# Patient Record
Sex: Male | Born: 1995 | Hispanic: Yes | Marital: Single | Smoking: Light tobacco smoker
Health system: Southern US, Community
[De-identification: ages and names within clinical notes are randomized; demographics above are authoritative.]

---

## 2019-12-01 ENCOUNTER — Other Ambulatory Visit: Payer: Self-pay

## 2019-12-01 ENCOUNTER — Emergency Department
Admission: EM | Admit: 2019-12-01 | Discharge: 2019-12-02 | Disposition: A | Discharge status: Other Acute Inpatient Hospital | Payer: Self-pay | Attending: Emergency Medicine | Admitting: Emergency Medicine

## 2019-12-01 ENCOUNTER — Encounter: Payer: Self-pay | Admitting: Emergency Medicine

## 2019-12-01 DIAGNOSIS — R440 Auditory hallucinations: Secondary | ICD-10-CM | POA: Insufficient documentation

## 2019-12-01 DIAGNOSIS — F29 Unspecified psychosis not due to a substance or known physiological condition: Secondary | ICD-10-CM | POA: Insufficient documentation

## 2019-12-01 DIAGNOSIS — Z20822 Contact with and (suspected) exposure to covid-19: Secondary | ICD-10-CM | POA: Insufficient documentation

## 2019-12-01 DIAGNOSIS — Z046 Encounter for general psychiatric examination, requested by authority: Secondary | ICD-10-CM

## 2019-12-01 DIAGNOSIS — R462 Strange and inexplicable behavior: Secondary | ICD-10-CM

## 2019-12-01 LAB — CBC
HCT: 41.2 % (ref 39.0–52.0)
Hemoglobin: 14.1 g/dL (ref 13.0–17.0)
MCH: 29.7 pg (ref 26.0–34.0)
MCHC: 34.2 g/dL (ref 30.0–36.0)
MCV: 86.7 fL (ref 80.0–100.0)
Platelets: 326 10*3/uL (ref 150–400)
RBC: 4.75 MIL/uL (ref 4.22–5.81)
RDW: 12 % (ref 11.5–15.5)
WBC: 8.7 10*3/uL (ref 4.0–10.5)
nRBC: 0 % (ref 0.0–0.2)

## 2019-12-01 LAB — RESPIRATORY PANEL BY RT PCR (FLU A&B, COVID)
Influenza A by PCR: NEGATIVE
Influenza B by PCR: NEGATIVE
SARS Coronavirus 2 by RT PCR: NEGATIVE

## 2019-12-01 LAB — URINE DRUG SCREEN, QUALITATIVE (ARMC ONLY)
Amphetamines, Ur Screen: NOT DETECTED
Barbiturates, Ur Screen: NOT DETECTED
Benzodiazepine, Ur Scrn: NOT DETECTED
Cannabinoid 50 Ng, Ur ~~LOC~~: NOT DETECTED
Cocaine Metabolite,Ur ~~LOC~~: NOT DETECTED
MDMA (Ecstasy)Ur Screen: NOT DETECTED
Methadone Scn, Ur: NOT DETECTED
Opiate, Ur Screen: NOT DETECTED
Phencyclidine (PCP) Ur S: NOT DETECTED
Tricyclic, Ur Screen: NOT DETECTED

## 2019-12-01 LAB — COMPREHENSIVE METABOLIC PANEL
ALT: 15 U/L (ref 0–44)
AST: 24 U/L (ref 15–41)
Albumin: 4.8 g/dL (ref 3.5–5.0)
Alkaline Phosphatase: 73 U/L (ref 38–126)
Anion gap: 7 (ref 5–15)
BUN: 13 mg/dL (ref 6–20)
CO2: 29 mmol/L (ref 22–32)
Calcium: 9.4 mg/dL (ref 8.9–10.3)
Chloride: 105 mmol/L (ref 98–111)
Creatinine, Ser: 0.99 mg/dL (ref 0.61–1.24)
GFR calc Af Amer: >60 mL/min (ref >60)
GFR calc non Af Amer: >60 mL/min (ref >60)
Glucose, Bld: 90 mg/dL (ref 70–99)
Potassium: 3.5 mmol/L (ref 3.5–5.1)
Sodium: 141 mmol/L (ref 135–145)
Total Bilirubin: 0.9 mg/dL (ref 0.3–1.2)
Total Protein: 8.1 g/dL (ref 6.5–8.1)

## 2019-12-01 LAB — SALICYLATE LEVEL: Salicylate Lvl: <7 mg/dL — ABNORMAL LOW (ref 7.0–30.0)

## 2019-12-01 LAB — ACETAMINOPHEN LEVEL: Acetaminophen (Tylenol), Serum: <10 ug/mL — ABNORMAL LOW (ref 10–30)

## 2019-12-01 LAB — ETHANOL: Alcohol, Ethyl (B): <10 mg/dL (ref <10)

## 2019-12-01 NOTE — ED Notes (Signed)
Pt given meal tray.
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
## 2019-12-01 NOTE — ED Notes (Signed)
Patients sister(Brenda) called to check status on patient.  [name] stated she could be used as a source of information @ [phone]

## 2019-12-01 NOTE — ED Notes (Signed)
Pt given meal tray.

## 2019-12-01 NOTE — ED Triage Notes (Signed)
Pt in via [facility]. Officer reports per pts familym the patient is withdrawing from something. Pt denies drug use or abuse.

## 2019-12-01 NOTE — ED Notes (Addendum)
Pt cooperative with staff.

## 2019-12-01 NOTE — ED Notes (Signed)
Pt belongings:  1- pair shoes 1-pair black socks 1-black sweater 1-boxers 1-pants  Given to RN

## 2019-12-01 NOTE — ED Notes (Signed)
IVC PENDING  CONSULT ?

## 2019-12-01 NOTE — ED Notes (Signed)
TTS machine placed in patients room, pt. Talking to TTS.  Pt. Calm and cooperative.

## 2019-12-01 NOTE — ED Notes (Signed)
This RN in room to speak with pt. RN asked patient if he knew why he was here, pt responded "because of a report". Pt denies mental health hx. Pt denies thoughts of harming himself or other. Pt is not very forthcoming with information at this time. Pt does appear to being having involuntary "jerking" of his body periodically. Pt is A & O in NAD at this time. Pt denies any needs at this time.

## 2019-12-01 NOTE — ED Notes (Signed)
Pt. Awake laying in bed watching tv, waiting for psych. Team to talk to him.  Pt. Is calm and cooperative at this time.

## 2019-12-01 NOTE — ED Notes (Signed)
NP talking to patient in room. 

## 2019-12-02 ENCOUNTER — Inpatient Hospital Stay
Admit: 2019-12-02 | Discharge: 2019-12-19 | DRG: 885 | Disposition: A | Discharge status: Home / Self Care | Payer: No Typology Code available for payment source | Attending: Psychiatry | Admitting: Psychiatry

## 2019-12-02 ENCOUNTER — Encounter: Payer: Self-pay | Admitting: Psychiatric/Mental Health

## 2019-12-02 DIAGNOSIS — Z20822 Contact with and (suspected) exposure to covid-19: Secondary | ICD-10-CM | POA: Diagnosis present

## 2019-12-02 DIAGNOSIS — F259 Schizoaffective disorder, unspecified: Secondary | ICD-10-CM | POA: Insufficient documentation

## 2019-12-02 DIAGNOSIS — F2081 Schizophreniform disorder: Principal | ICD-10-CM | POA: Diagnosis present

## 2019-12-02 DIAGNOSIS — R462 Strange and inexplicable behavior: Secondary | ICD-10-CM

## 2019-12-02 DIAGNOSIS — G3184 Mild cognitive impairment, so stated: Secondary | ICD-10-CM | POA: Diagnosis present

## 2019-12-02 DIAGNOSIS — Z046 Encounter for general psychiatric examination, requested by authority: Secondary | ICD-10-CM

## 2019-12-02 DIAGNOSIS — G4700 Insomnia, unspecified: Secondary | ICD-10-CM | POA: Diagnosis present

## 2019-12-02 HISTORY — DX: Strange and inexplicable behavior: R46.2

## 2019-12-02 MED ORDER — HYDROXYZINE HCL 25 MG PO TABS
25.0000 mg | ORAL_TABLET | Freq: Three times a day (TID) | ORAL | Status: DC | PRN
  Administered 2019-12-11: 25 mg via ORAL
  Filled 2019-12-02 (×2): qty 1

## 2019-12-02 MED ORDER — MAGNESIUM HYDROXIDE 400 MG/5ML PO SUSP: 30.0000 mL | Freq: Every day | ORAL | Status: DC | PRN

## 2019-12-02 MED ORDER — TRAZODONE HCL 50 MG PO TABS
50.0000 mg | ORAL_TABLET | Freq: Every evening | ORAL | Status: DC | PRN
  Administered 2019-12-12 – 2019-12-18 (×3): 50 mg via ORAL
  Filled 2019-12-02 (×3): qty 1

## 2019-12-02 MED ORDER — ALUM & MAG HYDROXIDE-SIMETH 200-200-20 MG/5ML PO SUSP: 30.0000 mL | ORAL | Status: DC | PRN

## 2019-12-02 MED ORDER — ACETAMINOPHEN 325 MG PO TABS: 650.0000 mg | ORAL_TABLET | Freq: Four times a day (QID) | ORAL | Status: DC | PRN

## 2019-12-02 NOTE — ED Notes (Signed)
Pt given meal tray.

## 2019-12-02 NOTE — ED Notes (Signed)
Pt received lunch tray   lw edt 

## 2019-12-02 NOTE — Progress Notes (Signed)
Patient meets criteria for inpatient treatment per Lerry Liner, NP. No appropriate beds at [facility] currently. CSW faxed referrals to the following facilities for review:  [facility]   [facility]   [facility]   [facility]   [facility]   [facility]   [facility]   [facility]   [facility]   [facility]    TTS will continue to seek bed placement.     Ruthann Cancer MSW, [facility] Social Worker Disposition  [facility] Ph: [phone] Fax: [phone] 12/02/2019 10:13 AM
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
## 2019-12-02 NOTE — ED Provider Notes (Signed)
-----------------------------------------   1:19 AM on 12/02/2019 -----------------------------------------  Blood pressure 133/84, pulse 69, temperature 97.9 F (36.6 C), temperature source Oral, resp. rate 18, height 5\' 10"  (1.778 m), weight 76.7 kg, SpO2 96 %.  The patient is calm and cooperative at this time.  There have been no acute events since the last update.  Awaiting disposition plan from Behavioral Medicine team.   , MD 12/02/19 [phone]

## 2019-12-02 NOTE — BH Assessment (Signed)
Assessment Note  Eric Pineda is an 24 y.o. male. Who has been involuntary committed by his family members who state that the patient is withdrawing from something. Patient present as very flat and disengage. His body is stiff and rigid as he lays in the bed although he does engaged actively in the evaluation. He denies any Suicidal Thoughts and stay said he acknowledges that he has behavioral changes since attempting to drink rubbing alcohol approximately a month ago. His behavior changes are unclear as his answers are often inconsistent when I asked what they may be he stays it's been going. Patient off the Flies yes to a question and then we can sand says no or vice versa. When asked why he would leave the home so that night patient states I'm walking for exercise. He denies any previous Mental Health history or current paranoia when asked why he presented to the emergency department he stays on report and repeats report several times patient seems to be suffering from Palilalia. He denies any sleep disturbances or illicit drug use. Pt. denies any suicidal ideation, plan or intent. Pt. denies the presence of any auditory or visual hallucinations at this time. Patient denies any other medical complaints.While pt denied HVA pt appeared extremely distracted and responses were notably slow to questions that the pt did answer   TTS has  contacted the patients sister Eric Pineda to receive collateral information. She explains that the patient's behavior has changed and that her and her father are concerned. Per her report the patient was released from a substance abuse recovery program in January and shortly after attempted to kill himself by drinking three fourths of a bottle of rubbing alcohol. It is unclear as to whether or not this was a true suicide attempt. She reports that he's now begin to have increasing anxiety paste often and often walks out of the home without alarming anyone in the middle of the night.  She also states that he often repeats itself over and over again even when no one else was around. She explains that these behaviors are quite unusual for him.    Diagnosis: Acute Psychosis   Past Medical History: History reviewed. No pertinent past medical history.  History reviewed. No pertinent surgical history.  Family History: No family history on file.  Social History:  has no history on file for tobacco, alcohol, and drug.  Additional Social History:  Alcohol / Drug Use Pain Medications: SEE PTA Prescriptions: SEE PTA Over the Counter: SEE PTA History of alcohol / drug use?: Yes Longest period of sobriety (when/how long): UTA  CIWA: CIWA-Ar BP: 133/84 Pulse Rate: 69 COWS:    Allergies: Not on File  Home Medications: (Not in a hospital admission)   OB/GYN Status:  No LMP for male patient.  General Assessment Data Location of Assessment: [facility] ED TTS Assessment: In system Is this a Tele or Face-to-Face Assessment?: Tele Assessment Is this an Initial Assessment or a Re-assessment for this encounter?: Initial Assessment Living Arrangements: Other (Comment) What gender do you identify as?: Male Living Arrangements: Other relatives Can pt return to current living arrangement?: Yes Admission Status: Involuntary Petitioner: Family member Is patient capable of signing voluntary admission?: No Referral Source: Self/Family/Friend Insurance type: None  Medical Screening Exam [facility] Walk-in ONLY) Medical Exam completed: Yes  Crisis Care Plan Living Arrangements: Other relatives Name of Psychiatrist: None  Name of Therapist: None  Education Status Is patient currently in school?: No Is the patient employed, unemployed or receiving  disability?: Unemployed  Risk to self with the past 6 months Suicidal Ideation: No Has patient been a risk to self within the past 6 months prior to admission? : No Suicidal Intent: No Has patient had any suicidal intent within the past  6 months prior to admission? : No Is patient at risk for suicide?: No, but patient needs Medical Clearance Suicidal Plan?: No Has patient had any suicidal plan within the past 6 months prior to admission? : No Access to Means: No What has been your use of drugs/alcohol within the last 12 months?: Hx of cocaine use Previous Attempts/Gestures: No How many times?: 0 Other Self Harm Risks: n Triggers for Past Attempts: None known Intentional Self Injurious Behavior: None Family Suicide History: No Recent stressful life event(s): (Unknown ) Persecutory voices/beliefs?: No Depression: (UTA) Depression Symptoms: (UTA) Substance abuse history and/or treatment for substance abuse?: Yes Suicide prevention information given to non-admitted patients: Not applicable  Risk to Others within the past 6 months Homicidal Ideation: No Does patient have any lifetime risk of violence toward others beyond the six months prior to admission? : No Thoughts of Harm to Others: No Current Homicidal Intent: No Current Homicidal Plan: No Access to Homicidal Means: No Identified Victim: none Assessment of Violence: None Noted Violent Behavior Description: none noted Does patient have access to weapons?: No Criminal Charges Pending?: No Does patient have a court date: No Is patient on probation?: No  Psychosis Hallucinations: None noted Delusions: None noted  Mental Status Report Appearance/Hygiene: In scrubs Eye Contact: Fair Motor Activity: Rigidity Speech: Elective mutism, Other (Comment)(Palilalia) Level of Consciousness: Alert Mood: Empty Affect: Flat Anxiety Level: Minimal Thought Processes: Irrelevant, Circumstantial Judgement: Partial Orientation: Time, Place, Person Obsessive Compulsive Thoughts/Behaviors: Minimal  Cognitive Functioning Concentration: Poor Memory: Recent Impaired, Remote Impaired Insight: see judgement above Impulse Control: Poor Appetite: Fair Have you had any  weight changes? : (UTA) Sleep: No Change Total Hours of Sleep: 7 Vegetative Symptoms: Unable to Assess  ADLScreening [facility] Assessment Services) Patient's cognitive ability adequate to safely complete daily activities?: Yes Patient able to express need for assistance with ADLs?: Yes Independently performs ADLs?: Yes (appropriate for developmental age)  Prior Inpatient Therapy Prior Inpatient Therapy: No  Prior Outpatient Therapy Prior Outpatient Therapy: No Does patient have an ACCT team?: No Does patient have Intensive In-House Services?  : No Does patient have [facility] services? : No Does patient have P4CC services?: No  ADL Screening (condition at time of admission) Patient's cognitive ability adequate to safely complete daily activities?: Yes Patient able to express need for assistance with ADLs?: Yes Independently performs ADLs?: Yes (appropriate for developmental age)       Abuse/Neglect Assessment (Assessment to be complete while patient is alone) Abuse/Neglect Assessment Can Be Completed: Yes Physical Abuse: Denies Verbal Abuse: Denies Sexual Abuse: Denies Exploitation of patient/patient's resources: Denies Self-Neglect: Denies Values / Beliefs Cultural Requests During Hospitalization: None Spiritual Requests During Hospitalization: None Consults Spiritual Care Consult Needed: No Transition of Care Team Consult Needed: No            Disposition:  Disposition Initial Assessment Completed for this Encounter: Yes  On Site Evaluation by:   Reviewed with Physician:    Laretta Alstrom 12/02/2019 12:20 AM

## 2019-12-02 NOTE — ED Notes (Signed)
Pt. Up using bathroom.  Pt. Returned to room with steady gait. 

## 2019-12-02 NOTE — ED Notes (Signed)
Pt ate 100% of breakfast tray.  Pt calm, resting and waiting on shower supplies.  lw edt

## 2019-12-02 NOTE — BH Assessment (Signed)
PATIENT BED AVAILABLE AT 10PM  Patient is to be admitted to [facility] by Psychiatric Nurse Practitioner Rishaun Dixn.  Attending Physician will be Dr. Toni Amend.   Patient has been assigned to room 312, by [facility] Charge Nurse [place].    ER staff is aware of the admission:  Louann ER Secretary    Dr. Colon Branch, ER MD   Dewayne Hatch Patient's Nurse   Nicole Cella Patient Access.

## 2019-12-03 ENCOUNTER — Other Ambulatory Visit: Payer: Self-pay

## 2019-12-03 ENCOUNTER — Inpatient Hospital Stay: Payer: No Typology Code available for payment source

## 2019-12-03 ENCOUNTER — Encounter: Payer: Self-pay | Admitting: Psychiatric/Mental Health

## 2019-12-03 DIAGNOSIS — F2081 Schizophreniform disorder: Principal | ICD-10-CM

## 2019-12-03 LAB — HEMOGLOBIN A1C
Hgb A1c MFr Bld: 5.1 % (ref 4.8–5.6)
Mean Plasma Glucose: 99.67 mg/dL

## 2019-12-03 LAB — TSH: TSH: 1.412 u[IU]/mL (ref 0.350–4.500)

## 2019-12-03 MED ORDER — HALOPERIDOL 1 MG PO TABS
2.0000 mg | ORAL_TABLET | Freq: Two times a day (BID) | ORAL | Status: DC
  Administered 2019-12-03 – 2019-12-06 (×6): 2 mg via ORAL
  Filled 2019-12-03 (×6): qty 2

## 2019-12-03 MED ORDER — BENZTROPINE MESYLATE 1 MG PO TABS: 0.5000 mg | ORAL_TABLET | Freq: Two times a day (BID) | ORAL | Status: DC | PRN
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
## 2019-12-03 NOTE — BHH Counselor (Signed)
Adult Comprehensive Assessment  Patient ID: Eric Pineda, male   DOB: 1995-09-24, 24 y.o.   MRN: [id number]  Information Source: Information source: Patient  Current Stressors:  Patient states their primary concerns and needs for treatment are:: Pt reports "just report, the usual" Patient states their goals for this hospitilization and ongoing recovery are:: Pt reports "make more time" Educational / Learning stressors: Pt reports "none" Employment / Job issues: Pt reports "none" Family Relationships: Pt reports "nonePublishing copy / Lack of resources (include bankruptcy): Pt reports "none" Housing / Lack of housing: Pt reports "none" Physical health (include injuries & life threatening diseases): Pt reports "diabetes and high blood pressure" Social relationships: Pt reports "none" Substance abuse: Pt reports "none" Bereavement / Loss: Pt reports "my grandfather, passed away a few years ago"  Living/Environment/Situation:  Living Arrangements: Parent, Other relatives Living conditions (as described by patient or guardian): Pt reports "it works" Who else lives in the home?: Pt reports "my parents and my siblings" How long has patient lived in current situation?: Pt reports "for a good minute" What is atmosphere in current home: Comfortable, Quarry manager, Supportive, Dangerous(Pt reports "mother nature")  Family History:  Marital status: Single Are you sexually active?: No What is your sexual orientation?: Pt reports "heterosexual" Does patient have children?: No  Childhood History:  By whom was/is the patient raised?: Both parents, Grandparents Description of patient's relationship with caregiver when they were a child: Pt reports "it was pretty good" Patient's description of current relationship with people who raised him/her: Pt reports "well" How were you disciplined when you got in trouble as a child/adolescent?: Pt reports "whoopings" Does patient have siblings?: Yes Number of  Siblings: 3 Description of patient's current relationship with siblings: Pt reports " I have one older brother and two younger sisters. We speak from time to time" Did patient suffer any verbal/emotional/physical/sexual abuse as a child?: No Did patient suffer from severe childhood neglect?: Yes(Pt reports "both of my parents worked") Has patient ever been sexually abused/assaulted/raped as an adolescent or adult?: No Was the patient ever a victim of a crime or a disaster?: No Witnessed domestic violence?: No Has patient been effected by domestic violence as an adult?: No  Education:  Highest grade of school patient has completed: Pt reports "I dont know" Currently a student?: No Learning disability?: Yes What learning problems does patient have?: Pt reports "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 I smoked it" Alcohol/Substance Abuse Treatment Hx: Denies past history Has alcohol/substance abuse ever caused legal problems?: Yes(Pt reports "I went to jail and got fined")  Social Support System:   Type of faith/religion: Pt reports "[place]" How does patient's faith help to cope with current illness?: Pt reports "well I would say my bible"  Leisure/Recreation:   Leisure and Hobbies: Pt reports "I do sports"  Strengths/Needs:      Discharge Plan:   Does patient have access to transportation?: No Does patient have financial barriers related to discharge medications?:  Yes Will patient be  returning to same living situation after discharge?: Yes  Summary/Recommendations:   Summary and Recommendations (to be completed by the evaluator): Eric Pineda is a 24 year old male from [place] [facility] He receives Medicaid for health coverage. Patient presents to [facility] under IVC by his father due to "Eric Pineda has become more withdrawn from the family and been seen talking to himself and responding to things that nobody else could see." He has a primary diagnosis of Bizarre behavior. Recommendations include crisis stabilization, therapeutic milieu, encourage group attendance and participation, medication management for detox/mood stabilization and development of comprehensive mental wellness/sobriety plan.  Eric Pineda. 12/03/2019

## 2019-12-03 NOTE — Plan of Care (Signed)
24 year old male, brought to the ED involuntarily by BPD. The patient was IVC'd by his father who stated that the patient has become more withdrawn from the family and been seen talking to himself and responding to things that nobody else could see. This is the first documented episode of any psychiatric admission. During the interview with the patient he denied any pain, no allergies to food or drugs, denies any past medical/surgical history. Patient denies tobacco or alcohol use, also any illicit drug use. The patient is able to complete all of his ADL's. No current complaints during admissions. The patient was clearly responding to some type of internal stimuli, before answering any of my questions the patient would look into the corners of the room, then blink his eyes before he would give an answer. During the assessment the patient would answer question by saying "I am on assignment." He could not explain what this answer meant. The patient could not express if he had any family or alternate support system. No known barriers at this time. Will continue to monitor and offer support. 15 minute safety checks.

## 2019-12-03 NOTE — Plan of Care (Signed)
D: Pt is alert and oriented to person, time, but not to place or situation. Pt denies SI/HI/AVH. Pt is cooperative with care, is noted to be pacing the hallways, restless, making bizzare ritualistic steps when walking in the hall. Pt is selectively mute, affect if flat, makes poor eye contact. Pt was cooperative with CT procedure, and EKG placement. Pt is present at meals, is seclusive to self, does not initiate contact with staff or peers. Pt is guarded, and suspicious. Later in the shift, pt opens up a little more and will answer yes or no questions without elaborating, reports yes when asked if he lives with his mother and father and siblings. Pt reports the year, "2021." When asked if pt sees an outpt doctor, pt says "yes." When asked if he had ever been hospitalized pt states "no." No distress noted or reported. Pt appears to be responding to internal stimuli, some self talk noted, mumbling to self, when asked what he is saying or to whom he is speaking, pt refuses to answer.  A: Will continue to monitor pt per Q15 minute face checks and monitor for safety and progress.  R: Safety maintained.   Problem: Education: Goal: Knowledge of [facility] Education information/materials will improve Outcome: Not Progressing Goal: Emotional status will improve Outcome: Not Progressing Goal: Mental status will improve Outcome: Not Progressing Goal: Verbalization of understanding the information provided will improve Outcome: Not Progressing   Problem: Activity: Goal: Interest or engagement in activities will improve Outcome: Not Progressing Goal: Sleeping patterns will improve Outcome: Not Progressing   Problem: Coping: Goal: Ability to verbalize frustrations and anger appropriately will improve Outcome: Not Progressing Goal: Ability to demonstrate self-control will improve Outcome: Not Progressing   Problem: Health Behavior/Discharge Planning: Goal: Identification of resources available  to assist in meeting health care needs will improve Outcome: Not Progressing Goal: Compliance with treatment plan for underlying cause of condition will improve Outcome: Not Progressing   Problem: Physical Regulation: Goal: Ability to maintain clinical measurements within normal limits will improve Outcome: Not Progressing   Problem: Safety: Goal: Periods of time without injury will increase Outcome: Not Progressing
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
## 2019-12-04 NOTE — Progress Notes (Signed)
D: Patient remains isolative to self. Was observed posturing and pacing the hall in a ritualistic manner. Internally preoccupied. Not voicing any SI, HI or AVH. Affect blunted.  A: Continue to monitor for safety R: Safety maintained.

## 2019-12-04 NOTE — Progress Notes (Signed)
[facility] MD Progress Note  12/04/2019 3:15 PM Eric Pineda  MRN:  [id number] Subjective: Follow-up for this patient with what seems like probably a new onset psychosis.  Patient was seen once again today in the office.  He was cooperative in a passive manner but his answers to questions were once again extraordinarily vague.  Could not give an answer that made sense to anything about what he had been doing recently or what his plans were for the future.  Affect flat and strange looking.  Thoughts seem very blocked.  Has not been aggressive or any danger so far in the hospital and appears to be compliant with medicine Principal Problem: Schizophreniform disorder ([place]) Diagnosis: Principal Problem:   Schizophreniform disorder ([place])  Total Time spent with patient: 30 minutes  Past Psychiatric History: Past history of substance abuse but not known as far as I can tell to have a past history of other serious mental illness  Past Medical History:  Past Medical History:  Diagnosis Date  . Bizarre behavior 12/02/2019   History reviewed. No pertinent surgical history. Family History: History reviewed. No pertinent family history. Family Psychiatric  History: See previous Social History:  Social History   Substance and Sexual Activity  Alcohol Use None     Social History   Substance and Sexual Activity  Drug Use Not on file    Social History   Socioeconomic History  . Marital status: Single    Spouse name: Not on file  . Number of children: Not on file  . Years of education: Not on file  . Highest education level: Not on file  Occupational History  . Not on file  Tobacco Use  . Smoking status: Never Smoker  . Smokeless tobacco: Never Used  Substance and Sexual Activity  . Alcohol use: Not on file  . Drug use: Not on file  . Sexual activity: Not on file  Other Topics Concern  . Not on file  Social History Narrative  . Not on file   Social Determinants of Health   Financial  Resource Strain:   . Difficulty of Paying Living Expenses:   Food Insecurity:   . Worried About Charity fundraiser in the Last Year:   . Arboriculturist in the Last Year:   Transportation Needs:   . Film/video editor (Medical):   Marland Kitchen Lack of Transportation (Non-Medical):   Physical Activity:   . Days of Exercise per Week:   . Minutes of Exercise per Session:   Stress:   . Feeling of Stress :   Social Connections:   . Frequency of Communication with Friends and Family:   . Frequency of Social Gatherings with Friends and Family:   . Attends Religious Services:   . Active Member of Clubs or Organizations:   . Attends Archivist Meetings:   Marland Kitchen Marital Status:    Additional Social History:                         Sleep: Fair  Appetite:  Fair  Current Medications: Current Facility-Administered Medications  Medication Dose Route Frequency Provider Last Rate Last Admin  . acetaminophen (TYLENOL) tablet 650 mg  650 mg Oral Q6H PRN Dixon, Rashaun M, NP      . alum & mag hydroxide-simeth (MAALOX/MYLANTA) 200-200-20 MG/5ML suspension 30 mL  30 mL Oral Q4H PRN Dixon, Rashaun M, NP      . benztropine (COGENTIN) tablet 0.5  mg  0.5 mg Oral BID PRN Lucynda Rosano, Jackquline Denmark, MD      . haloperidol (HALDOL) tablet 2 mg  2 mg Oral BID Jailani Hogans, Jackquline Denmark, MD   2 mg at 12/04/19 5361  . hydrOXYzine (ATARAX/VISTARIL) tablet 25 mg  25 mg Oral TID PRN Jearld Lesch, NP      . magnesium hydroxide (MILK OF MAGNESIA) suspension 30 mL  30 mL Oral Daily PRN Durwin Nora, Rashaun M, NP      . traZODone (DESYREL) tablet 50 mg  50 mg Oral QHS PRN Jearld Lesch, NP        Lab Results:  Results for orders placed or performed during the hospital encounter of 12/02/19 (from the past 48 hour(s))  Hemoglobin A1c     Status: None   Collection Time: 12/03/19  1:36 PM  Result Value Ref Range   Hgb A1c MFr Bld 5.1 4.8 - 5.6 %    Comment: (NOTE) Pre diabetes:          5.7%-6.4% Diabetes:               >6.4% Glycemic control for   <7.0% adults with diabetes    Mean Plasma Glucose 99.67 mg/dL    Comment: Performed at [facility], [address], [place], [place] [address]  TSH     Status: None   Collection Time: 12/03/19  1:36 PM  Result Value Ref Range   TSH 1.412 0.350 - 4.500 uIU/mL    Comment: Performed by a 3rd Generation assay with a functional sensitivity of <=0.01 uIU/mL. Performed at [facility], [address] [place], [place] [address]     Blood Alcohol level:  Lab Results  Component Value Date   [facility] <10 12/01/2019    Metabolic Disorder Labs: Lab Results  Component Value Date   HGBA1C 5.1 12/03/2019   MPG 99.67 12/03/2019   No results found for: PROLACTIN No results found for: CHOL, TRIG, HDL, CHOLHDL, VLDL, LDLCALC  Physical Findings: AIMS:  , ,  ,  ,    CIWA:    COWS:     Musculoskeletal: Strength & Muscle Tone: within normal limits Gait & Station: normal Patient leans: N/A  Psychiatric Specialty Exam: Physical Exam  Nursing note and vitals reviewed. Constitutional: He appears well-developed and well-nourished.  HENT:  Head: Normocephalic and atraumatic.  Eyes: Pupils are equal, round, and reactive to light. Conjunctivae are normal.  Cardiovascular: Regular rhythm and normal heart sounds.  Respiratory: Effort normal. No respiratory distress.  GI: Soft.  Musculoskeletal:        General: Normal range of motion.     Cervical back: Normal range of motion.  Neurological: He is alert.  Skin: Skin is warm and dry.  Psychiatric: His affect is blunt. He is withdrawn. He is not agitated. Cognition and memory are impaired. He expresses inappropriate judgment. He is noncommunicative.    Review of Systems  Constitutional: Negative.   HENT: Negative.   Eyes: Negative.   Respiratory: Negative.   Cardiovascular: Negative.   Gastrointestinal: Negative.   Musculoskeletal: Negative.   Skin: Negative.   Neurological: Negative.    Psychiatric/Behavioral: Negative.     Blood pressure 123/88, pulse 71, temperature 97.8 F (36.6 C), temperature source Oral, resp. rate 18, height 5\' 8"  (1.727 m), weight 71.7 kg, SpO2 99 %.Body mass index is 24.02 kg/m.  General Appearance: Casual  Eye Contact:  Minimal  Speech:  Slow  Volume:  Decreased  Mood:  Euthymic  Affect:  Constricted  Thought Process:  Disorganized  Orientation:  Negative  Thought Content:  Illogical  Suicidal Thoughts:  No  Homicidal Thoughts:  No  Memory:  Immediate;   Fair Recent;   Poor Remote;   Poor  Judgement:  Impaired  Insight:  Lacking  Psychomotor Activity:  Decreased  Concentration:  Concentration: Poor  Recall:  Poor  Fund of Knowledge:  Fair  Language:  Fair  Akathisia:  No  Handed:  Right  AIMS (if indicated):     Assets:  Physical Health Resilience  ADL's:  Impaired  Cognition:  Impaired,  Mild  Sleep:  Number of Hours: 5     Treatment Plan Summary: Daily contact with patient to assess and evaluate symptoms and progress in treatment, Medication management and Plan CAT scan was completely normal.  EKG completely normal.  Tolerating Haldol.  Tried to explain to him once again why the medication was ordered and what the overall diagnosis and treatment plan were.  He made no response.  Still passively cooperative.  I will try once again today to get in touch with his family.  Mordecai Rasmussen, MD 12/04/2019, 3:15 PM

## 2019-12-04 NOTE — Plan of Care (Signed)
  Problem: Education: Goal: Knowledge of [facility] Education information/materials will improve Outcome: Not Progressing Goal: Emotional status will improve Outcome: Not Progressing Goal: Mental status will improve Outcome: Not Progressing Goal: Verbalization of understanding the information provided will improve Outcome: Not Progressing  D: Patient remains isolative to self. Was observed posturing and pacing the hall in a ritualistic manner. Internally preoccupied. Not voicing any SI, HI or AVH. Affect blunted.  A: Continue to monitor for safety R: Safety maintained.

## 2019-12-04 NOTE — BHH Group Notes (Signed)
B[facility] Group Notes:  (Nursing/MHT/Case Management/Adjunct)  Date:  12/04/2019  Time:  9:19 PM  Type of Therapy:  Group Therapy  Participation Level:  Did Not Attend    Landry Mellow 12/04/2019, 9:19 PM

## 2019-12-04 NOTE — Progress Notes (Signed)
Recreation Therapy Notes  Date: 12/04/2019  Time: 9:30 am   Location: Craft room   Behavioral response: N/A   Intervention Topic: Stress Management   Discussion/Intervention: Patient did not attend group.   Clinical Observations/Feedback:  Patient did not attend group.   Eric Pineda LRT/CTRS        Greta Yung 12/04/2019 11:08 AM 

## 2019-12-04 NOTE — BHH Group Notes (Signed)
Overcoming Obstacles  12/04/2019 1PM  Type of Therapy and Topic:  Group Therapy:  Overcoming Obstacles  Participation Level:  Did Not Attend    Description of Group:    In this group patients will be encouraged to explore what they see as obstacles to their own wellness and recovery. They will be guided to discuss their thoughts, feelings, and behaviors related to these obstacles. The group will process together ways to cope with barriers, with attention given to specific choices patients can make. Each patient will be challenged to identify changes they are motivated to make in order to overcome their obstacles. This group will be process-oriented, with patients participating in exploration of their own experiences as well as giving and receiving support and challenge from other group members.   Therapeutic Goals: 1. Patient will identify personal and current obstacles as they relate to admission. 2. Patient will identify barriers that currently interfere with their wellness or overcoming obstacles.  3. Patient will identify feelings, thought process and behaviors related to these barriers. 4. Patient will identify two changes they are willing to make to overcome these obstacles:      Summary of Patient Progress     Therapeutic Modalities:   Cognitive Behavioral Therapy Solution Focused Therapy Motivational Interviewing Relapse Prevention Therapy    Lowella Dandy, MSW, LCSW 12/04/2019 2:31 PM

## 2019-12-04 NOTE — Progress Notes (Signed)
D: Patient Presents with appropriate mood and affect.  Patient was calm and cooperative during med pass and took his medicine without incident.  Patient was isolative.   Patient denies SI/HI/AVH.  Patient denies physical pain per inventory. Patient reports "good" concentration and sleep.  Patient reports "normal" energy levels.   A:  A: Continue to monitor for safety.  R: Safety maintained.   Problem: Education: Goal: Knowledge of [place] [facility] Education information/materials will improve Outcome: Not Progressing Goal: Emotional status will improve Outcome: Not Progressing Goal: Mental status will improve Outcome: Not Progressing Goal: Verbalization of understanding the information provided will improve Outcome: Not Progressing   Problem: Activity: Goal: Interest or engagement in activities will improve Outcome: Not Progressing Goal: Sleeping patterns will improve Outcome: Not Progressing   Problem: Coping: Goal: Ability to verbalize frustrations and anger appropriately will improve Outcome: Not Progressing Goal: Ability to demonstrate self-control will improve Outcome: Not Progressing   Problem: Health Behavior/Discharge Planning: Goal: Identification of resources available to assist in meeting health care needs will improve Outcome: Not Progressing Goal: Compliance with treatment plan for underlying cause of condition will improve Outcome: Not Progressing   Problem: Physical Regulation: Goal: Ability to maintain clinical measurements within normal limits will improve Outcome: Not Progressing   Problem: Safety: Goal: Periods of time without injury will increase Outcome: Not Progressing

## 2019-12-04 NOTE — B[facility] Group Notes (Signed)
[facility] Group Notes:  (Nursing/MHT/Case Management/Adjunct)  Date:  12/04/2019  Time:  3:19 PM  Type of Therapy:  Music Group  Participation Level:  Did Not Attend  Summary of Progress/Problems:  Kerrie Pleasure 12/04/2019, 3:19 PM

## 2019-12-05 NOTE — Progress Notes (Signed)
Pt has been isolative in room all day except for meals. Pt took a shower and asked for new scrubs. Pt is flat but cooperative. Pt has no complaints.  Torrie Mayers RN

## 2019-12-05 NOTE — Tx Team (Addendum)
Interdisciplinary Treatment and Diagnostic Plan Update  12/05/2019 Time of Session: 9am Eric Pineda MRN: [id number]  Principal Diagnosis: Schizophreniform disorder [facility])  Secondary Diagnoses: Principal Problem:   Schizophreniform disorder ([place])   Current Medications:  Current Facility-Administered Medications  Medication Dose Route Frequency Provider Last Rate Last Admin  . acetaminophen (TYLENOL) tablet 650 mg  650 mg Oral Q6H PRN Dixon, Rashaun M, NP      . alum & mag hydroxide-simeth (MAALOX/MYLANTA) 200-200-20 MG/5ML suspension 30 mL  30 mL Oral Q4H PRN Dixon, Rashaun M, NP      . benztropine (COGENTIN) tablet 0.5 mg  0.5 mg Oral BID PRN Clapacs, John T, MD      . haloperidol (HALDOL) tablet 2 mg  2 mg Oral BID Clapacs, Madie Reno, MD   2 mg at 12/05/19 0909  . hydrOXYzine (ATARAX/VISTARIL) tablet 25 mg  25 mg Oral TID PRN Dixon, Ernst Bowler, NP      . magnesium hydroxide (MILK OF MAGNESIA) suspension 30 mL  30 mL Oral Daily PRN Dixon, Rashaun M, NP      . traZODone (DESYREL) tablet 50 mg  50 mg Oral QHS PRN Deloria Lair, NP       PTA Medications: No medications prior to admission.    Patient Stressors:    Patient Strengths:    Treatment Modalities: Medication Management, Group therapy, Case management,  1 to 1 session with clinician, Psychoeducation, Recreational therapy.   Physician Treatment Plan for Primary Diagnosis: Schizophreniform disorder [facility]) Long Term Goal(s): Improvement in symptoms so as ready for discharge Improvement in symptoms so as ready for discharge   Short Term Goals: Ability to demonstrate self-control will improve Ability to identify and develop effective coping behaviors will improve Ability to maintain clinical measurements within normal limits will improve Compliance with prescribed medications will improve  Medication Management: Evaluate patient's response, side effects, and tolerance of medication regimen.  Therapeutic Interventions: 1 to  1 sessions, Unit Group sessions and Medication administration.  Evaluation of Outcomes: Not Met  Physician Treatment Plan for Secondary Diagnosis: Principal Problem:   Schizophreniform disorder ([place])  Long Term Goal(s): Improvement in symptoms so as ready for discharge Improvement in symptoms so as ready for discharge   Short Term Goals: Ability to demonstrate self-control will improve Ability to identify and develop effective coping behaviors will improve Ability to maintain clinical measurements within normal limits will improve Compliance with prescribed medications will improve     Medication Management: Evaluate patient's response, side effects, and tolerance of medication regimen.  Therapeutic Interventions: 1 to 1 sessions, Unit Group sessions and Medication administration.  Evaluation of Outcomes: Not Met   RN Treatment Plan for Primary Diagnosis: Schizophreniform disorder ([place]) Long Term Goal(s): Knowledge of disease and therapeutic regimen to maintain health will improve  Short Term Goals: Ability to verbalize feelings will improve, Ability to identify and develop effective coping behaviors will improve and Compliance with prescribed medications will improve  Medication Management: RN will administer medications as ordered by provider, will assess and evaluate patient's response and provide education to patient for prescribed medication. RN will report any adverse and/or side effects to prescribing provider.  Therapeutic Interventions: 1 on 1 counseling sessions, Psychoeducation, Medication administration, Evaluate responses to treatment, Monitor vital signs and CBGs as ordered, Perform/monitor CIWA, COWS, AIMS and Fall Risk screenings as ordered, Perform wound care treatments as ordered.  Evaluation of Outcomes: Not Met   LCSW Treatment Plan for Primary Diagnosis: Schizophreniform disorder ([place]) Long Term Goal(s): Safe  transition to appropriate next level of care at  discharge, Engage patient in therapeutic group addressing interpersonal concerns.  Short Term Goals: Engage patient in aftercare planning with referrals and resources  Therapeutic Interventions: Assess for all discharge needs, 1 to 1 time with Social worker, Explore available resources and support systems, Assess for adequacy in community support network, Educate family and significant other(s) on suicide prevention, Complete Psychosocial Assessment, Interpersonal group therapy.  Evaluation of Outcomes: Not Met   Progress in Treatment: Attending groups: No. Participating in groups: No. Taking medication as prescribed: Yes. Toleration medication: Yes. Family/Significant other contact made: Yes, individual(s) contacted:  pt declined Patient understands diagnosis: Unknown Discussing patient identified problems/goals with staff: No. Medical problems stabilized or resolved: No. Denies suicidal/homicidal ideation: Unknown Issues/concerns per patient self-inventory: No. Other: NA  New problem(s) identified: No, Describe:  None reported  New Short Term/Long Term Goal(s):  Patient Goals:  Pt given opportunity to attend meeting but declined. No goal provided at this time.  Discharge Plan or Barriers:   Reason for Continuation of Hospitalization: Medication stabilization  Estimated Length of Stay:1-7 days  Recreational Therapy: Patient Stressors: N/A Patient Goal: Patient will engage in groups without prompting or encouragement from LRT x3 group sessions within 5 recreation therapy group sessions  Attendees: Patient: 12/05/2019 9:50 AM  Physician: Alethia Berthold 12/05/2019 9:50 AM  Nursing:  12/05/2019 9:50 AM  RN Care Manager: 12/05/2019 9:50 AM  Social Worker: Minette Brine Moton 12/05/2019 9:50 AM  Recreational Therapist: Isaias Sakai Damany Eastman 12/05/2019 9:50 AM  Other:  12/05/2019 9:50 AM  Other:  12/05/2019 9:50 AM  Other: 12/05/2019 9:50 AM    Scribe for Treatment Team: Yvette Rack,  LCSW 12/05/2019 9:50 AM

## 2019-12-05 NOTE — BHH Counselor (Signed)
CSW attempted to meet with the pt to discuss a discharge plan. When asked if he has a mental health provider he sees, pt states "yes" and did not provide any further details. When asked if he would like CSW to make a referral to a provider, pt states "thank you maam."

## 2019-12-05 NOTE — Progress Notes (Signed)
[facility] MD Progress Note  12/05/2019 4:26 PM Eric Pineda  MRN:  [id number] Subjective: Follow-up for this 24 year old man who appears to have new onset psychosis.  Patient came to the office to speak with me today but only briefly.  After just a couple of questions he abruptly got up and told me that he could not talk anymore.  This was particularly odd as it did not seem to me that we had been discussing anything particularly emotional or difficult.  He continues to answer questions with only a few stereotyped phrases and could not give me any clear picture of what is been happening before he came in.  I spoke with his sister this evening and she clarified the history that he had been in [place] for a while and substance abuse treatment prior to coming home in January and that ever since then he seems to have been having these kind of psychotic behaviors.  Evidently there was an episode in which she drank rubbing alcohol and was treated at the hospital in [place] but did not get any psychiatric treatment or follow-up. Principal Problem: Schizophreniform disorder ([place]) Diagnosis: Principal Problem:   Schizophreniform disorder ([place])  Total Time spent with patient: 30 minutes  Past Psychiatric History: Other than substance abuse especially alcohol problems nothing known  Past Medical History:  Past Medical History:  Diagnosis Date  . Bizarre behavior 12/02/2019   History reviewed. No pertinent surgical history. Family History: History reviewed. No pertinent family history. Family Psychiatric  History: See previous Social History:  Social History   Substance and Sexual Activity  Alcohol Use None     Social History   Substance and Sexual Activity  Drug Use Not on file    Social History   Socioeconomic History  . Marital status: Single    Spouse name: Not on file  . Number of children: Not on file  . Years of education: Not on file  . Highest education level: Not on file  Occupational  History  . Not on file  Tobacco Use  . Smoking status: Never Smoker  . Smokeless tobacco: Never Used  Substance and Sexual Activity  . Alcohol use: Not on file  . Drug use: Not on file  . Sexual activity: Not on file  Other Topics Concern  . Not on file  Social History Narrative  . Not on file   Social Determinants of Health   Financial Resource Strain:   . Difficulty of Paying Living Expenses:   Food Insecurity:   . Worried About Charity fundraiser in the Last Year:   . Arboriculturist in the Last Year:   Transportation Needs:   . Film/video editor (Medical):   Marland Kitchen Lack of Transportation (Non-Medical):   Physical Activity:   . Days of Exercise per Week:   . Minutes of Exercise per Session:   Stress:   . Feeling of Stress :   Social Connections:   . Frequency of Communication with Friends and Family:   . Frequency of Social Gatherings with Friends and Family:   . Attends Religious Services:   . Active Member of Clubs or Organizations:   . Attends Archivist Meetings:   Marland Kitchen Marital Status:    Additional Social History:                         Sleep: Fair  Appetite:  Fair  Current Medications: Current Facility-Administered Medications  Medication  Dose Route Frequency Provider Last Rate Last Admin  . acetaminophen (TYLENOL) tablet 650 mg  650 mg Oral Q6H PRN Dixon, Rashaun M, NP      . alum & mag hydroxide-simeth (MAALOX/MYLANTA) 200-200-20 MG/5ML suspension 30 mL  30 mL Oral Q4H PRN Dixon, Rashaun M, NP      . benztropine (COGENTIN) tablet 0.5 mg  0.5 mg Oral BID PRN Paulyne Mooty T, MD      . haloperidol (HALDOL) tablet 2 mg  2 mg Oral BID Cecylia Brazill, Jackquline Denmark, MD   2 mg at 12/05/19 0909  . hydrOXYzine (ATARAX/VISTARIL) tablet 25 mg  25 mg Oral TID PRN Dixon, Elray Buba, NP      . magnesium hydroxide (MILK OF MAGNESIA) suspension 30 mL  30 mL Oral Daily PRN Dixon, Elray Buba, NP      . traZODone (DESYREL) tablet 50 mg  50 mg Oral QHS PRN Jearld Lesch, NP        Lab Results: No results found for this or any previous visit (from the past 48 hour(s)).  Blood Alcohol level:  Lab Results  Component Value Date   ETH <10 12/01/2019    Metabolic Disorder Labs: Lab Results  Component Value Date   HGBA1C 5.1 12/03/2019   MPG 99.67 12/03/2019   No results found for: PROLACTIN No results found for: CHOL, TRIG, HDL, CHOLHDL, VLDL, LDLCALC  Physical Findings: AIMS:  , ,  ,  ,    CIWA:    COWS:     Musculoskeletal: Strength & Muscle Tone: within normal limits Gait & Station: normal Patient leans: N/A  Psychiatric Specialty Exam: Physical Exam  Nursing note and vitals reviewed. Constitutional: He appears well-developed and well-nourished.  HENT:  Head: Normocephalic and atraumatic.  Eyes: Pupils are equal, round, and reactive to light. Conjunctivae are normal.  Cardiovascular: Regular rhythm and normal heart sounds.  Respiratory: Effort normal. No respiratory distress.  GI: Soft.  Musculoskeletal:        General: Normal range of motion.     Cervical back: Normal range of motion.  Neurological: He is alert.  Skin: Skin is warm and dry.  Psychiatric: His affect is blunt and inappropriate. He is withdrawn. He is not agitated and not aggressive. Cognition and memory are impaired. He expresses inappropriate judgment. He is noncommunicative.    Review of Systems  Unable to perform ROS: Psychiatric disorder  HENT: Negative.     Blood pressure 132/89, pulse 74, temperature 97.6 F (36.4 C), temperature source Oral, resp. rate 17, height 5\' 8"  (1.727 m), weight 71.7 kg, SpO2 98 %.Body mass index is 24.02 kg/m.  General Appearance: Casual  Eye Contact:  Minimal  Speech:  Garbled and Slow  Volume:  Decreased  Mood:  Euthymic  Affect:  Inappropriate  Thought Process:  Disorganized  Orientation:  Negative  Thought Content:  Illogical, Rumination and Tangential  Suicidal Thoughts:  No  Homicidal Thoughts:  No  Memory:   Immediate;   Poor Recent;   Poor Remote;   Poor  Judgement:  Impaired  Insight:  Shallow  Psychomotor Activity:  Decreased  Concentration:  Concentration: Poor  Recall:  Poor  Fund of Knowledge:  Poor  Language:  Poor  Akathisia:  No  Handed:  Right  AIMS (if indicated):     Assets:  Housing Physical Health Resilience  ADL's:  Impaired  Cognition:  Impaired,  Mild  Sleep:  Number of Hours: 8.15     Treatment Plan Summary:  Daily contact with patient to assess and evaluate symptoms and progress in treatment, Medication management and Plan After talking with his sister it does occur to me that Korsakoff's dementia would be in the differential diagnosis although he really does not to me appear very much like the patient's I have known with that diagnosis.  He has bizarre stereotyped behavior and the fact that rather than confabulating he is not talking at all do not you really fit with Korsakoff's.  I still think this is most likely schizophreniform disorder.  I explained this to his sister.  We will try to contact the rest of the family tomorrow as well.  For now continue trying to get him to take antipsychotic medicine.  Mordecai Rasmussen, MD 12/05/2019, 4:26 PM

## 2019-12-05 NOTE — Plan of Care (Signed)
Pt rates depression 10/10, Pt denies anxiety, SI, HI and AVH. Pt was educated on care plan and verbalizes understanding. Torrie Mayers RN Problem: Education: Goal: Knowledge of [place] [facility] Education information/materials will improve Outcome: Progressing Goal: Emotional status will improve Outcome: Progressing Goal: Mental status will improve Outcome: Progressing Goal: Verbalization of understanding the information provided will improve Outcome: Progressing   Problem: Activity: Goal: Interest or engagement in activities will improve Outcome: Not Progressing Goal: Sleeping patterns will improve Outcome: Progressing   Problem: Coping: Goal: Ability to verbalize frustrations and anger appropriately will improve Outcome: Progressing Goal: Ability to demonstrate self-control will improve Outcome: Progressing   Problem: Health Behavior/Discharge Planning: Goal: Identification of resources available to assist in meeting health care needs will improve Outcome: Progressing Goal: Compliance with treatment plan for underlying cause of condition will improve Outcome: Progressing   Problem: Physical Regulation: Goal: Ability to maintain clinical measurements within normal limits will improve Outcome: Progressing   Problem: Safety: Goal: Periods of time without injury will increase Outcome: Progressing

## 2019-12-05 NOTE — Progress Notes (Signed)
Patient was in his room upon arrival to the unit. Patient pleasant during assessment denying SI/HI/AVH and anxiety. Pt endorses depression stating he always feels depressed. Patient given education, support and encouragement to be active in his treatment plan. Patient isolative to his room and minimal with staff this evening. Patient being monitored Q 15 minutes for safety per unit protocol, pt remains safe on the unit.

## 2019-12-05 NOTE — Plan of Care (Signed)
Pt stated he was feeling better today compared to yesterday.   Problem: Education: Goal: Emotional status will improve Outcome: Progressing Goal: Mental status will improve Outcome: Progressing

## 2019-12-05 NOTE — Progress Notes (Signed)
Patient alert and oriented x 3 with periods of confusion to situation, his  affect is blunted thoughts are disorganized, speech tangential , he denies SI/HI/AVH but noted responding to internal stimuli. Patient was also noted pacing the unit in an instance he was impulsive eating off someone's left over tray, he was redirected to put off the tray he became agitated but later was receptive.Patient was given a sandwich tray was offered emotional support and encouraged to attend evenong wrap up group. Patient attend group, he refused PRN sleep aide, 15 minutes safety checks maintained.

## 2019-12-05 NOTE — BHH Group Notes (Signed)
  LCSW Group Therapy Note  12/05/2019 12:44 PM   Type of Therapy/Topic:  Group Therapy:  Feelings about Diagnosis  Participation Level:  Did Not Attend   Description of Group:   This group will allow patients to explore their thoughts and feelings about diagnoses they have received. Patients will be guided to explore their level of understanding and acceptance of these diagnoses. Facilitator will encourage patients to process their thoughts and feelings about the reactions of others to their diagnosis and will guide patients in identifying ways to discuss their diagnosis with significant others in their lives. This group will be process-oriented, with patients participating in exploration of their own experiences, giving and receiving support, and processing challenge from other group members.   Therapeutic Goals: 1. Patient will demonstrate understanding of diagnosis as evidenced by identifying two or more symptoms of the disorder 2. Patient will be able to express two feelings regarding the diagnosis 3. Patient will demonstrate their ability to communicate their needs through discussion and/or role play  Summary of Patient Progress: x   Therapeutic Modalities:   Cognitive Behavioral Therapy Brief Therapy Feelings Identification    Iris Pert, MSW, LCSW Clinical Social Work 12/05/2019 12:44 PM

## 2019-12-05 NOTE — Progress Notes (Signed)
Recreation Therapy Notes     Date: 12/05/2019  Time: 9:30 am   Location: Craft room   Behavioral response: N/A   Intervention Topic: Necessities    Discussion/Intervention: Patient did not attend group.   Clinical Observations/Feedback:  Patient did not attend group.   Murial Beam LRT/CTRS        Kyandra Mcclaine 12/05/2019 11:52 AM

## 2019-12-05 NOTE — Progress Notes (Signed)
Recreation Therapy Notes  INPATIENT RECREATION THERAPY ASSESSMENT  Patient Details Name: Eric Pineda MRN: [id number] DOB: 07/01/1996 Today's Date: 12/05/2019       Information Obtained From: Patient(Patient expressed he was to tired)  Able to Participate in Assessment/Interview:    Patient Presentation:    Reason for Admission (Per Patient):    Patient Stressors:    Coping Skills:      Leisure Interests (2+):     Frequency of Recreation/Participation:    Awareness of Community Resources:     [employer]:     Current Use:    If no, Barriers?:    Expressed Interest in [employer] Information:    [place] of Residence:     Patient Main Form of Transportation:    Patient Strengths:     Patient Identified Areas of Improvement:     Patient Goal for Hospitalization:     Current SI (including self-harm):     Current HI:     Current AVH:    Staff Intervention Plan:    Consent to Intern Participation:    Katlyn Muldrew 12/05/2019, 1:23 PM

## 2019-12-06 MED ORDER — HALOPERIDOL 5 MG PO TABS
5.0000 mg | ORAL_TABLET | Freq: Two times a day (BID) | ORAL | Status: DC
  Administered 2019-12-06 – 2019-12-08 (×4): 5 mg via ORAL
  Filled 2019-12-06 (×4): qty 1

## 2019-12-06 NOTE — BHH Group Notes (Signed)
B[facility] Group Notes:  (Nursing/MHT/Case Management/Adjunct)  Date:  12/06/2019  Time:  8:59 AM  Type of Therapy:  Community Meeting  Participation Level:  Did Not Attend   Lynelle Smoke [facility] 12/06/2019, 8:59 AM

## 2019-12-06 NOTE — BHH Counselor (Addendum)
CSW met with pt to discuss discharge plan. Pt denied having a mental health provider and declined referral for outpatient treatment.

## 2019-12-06 NOTE — Progress Notes (Signed)
Pt has been calm and cooperative today. He has deen isolative in his room except for meals. He denies any problems. When asked how he was feeling he replied "well". He took his new dose of seroquel this evening. His effect is flat. He hops/marches down the hall at times. Torrie Mayers RN

## 2019-12-06 NOTE — Plan of Care (Signed)
Pt observed having bizarre behaviors, hopping down the halls and marching. Pt pleasant and without concern   Problem: Education: Goal: Emotional status will improve Outcome: Not Progressing Goal: Mental status will improve Outcome: Not Progressing

## 2019-12-06 NOTE — Progress Notes (Signed)
Recreation Therapy Notes  Date: 12/06/2019  Time: 9:30 am   Location: Craft room   Behavioral response: N/A   Intervention Topic: Self-Care  Discussion/Intervention: Patient did not attend group.   Clinical Observations/Feedback:  Patient did not attend group.   Tauna Macfarlane LRT/CTRS        Nimco Bivens 12/06/2019 11:44 AM 

## 2019-12-06 NOTE — BHH Group Notes (Signed)
Emotional Regulation 12/06/2019 1PM  Type of Therapy/Topic:  Group Therapy:  Emotion Regulation  Participation Level:  Did Not Attend   Description of Group:   The purpose of this group is to assist patients in learning to regulate negative emotions and experience positive emotions. Patients will be guided to discuss ways in which they have been vulnerable to their negative emotions. These vulnerabilities will be juxtaposed with experiences of positive emotions or situations, and patients will be challenged to use positive emotions to combat negative ones. Special emphasis will be placed on coping with negative emotions in conflict situations, and patients will process healthy conflict resolution skills.  Therapeutic Goals: 1. Patient will identify two positive emotions or experiences to reflect on in order to balance out negative emotions 2. Patient will label two or more emotions that they find the most difficult to experience 3. Patient will demonstrate positive conflict resolution skills through discussion and/or role plays  Summary of Patient Progress:       Therapeutic Modalities:   Cognitive Behavioral Therapy Feelings Identification Dialectical Behavioral Therapy   Suzan Slick, LCSW 12/06/2019 2:15 PM

## 2019-12-06 NOTE — Progress Notes (Signed)
Patient was in his room upon arrival to the unit. Patient pleasant during assessment denying SI/HI/AVH and anxiety. Pt endorses depression. Patient given education, support and encouragement to be active in his treatment plan. Patient isolative to his room and minimal with staff this evening. Patient being monitored Q 15 minutes for safety per unit protocol, pt remains safe on the unit.

## 2019-12-06 NOTE — Progress Notes (Signed)
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 Patient has continues to be disorganized with his thought process when answering questions. Patient gives very simple and short answers with little information. Continue stating "I'm fine." Patient continues to deny any suicidal or homicidal ideations and denies any hallucinations. Patient is denying having any paranoid thoughts as well. Patient was asked how he slept last night and patient stated "the normal 8 hours."  Principal Problem: Schizophreniform disorder (HCC) Diagnosis: Principal Problem:   Schizophreniform disorder (HCC)  Total Time spent with patient: 30 minutes  Past Psychiatric History: No records available except a mention that he had substance abuse treatment in January.  He had an episode of drinking rubbing alcohol between then and now but it is not clear how that was treated.  No known past psychiatric treatment or medication.  Past Medical History:  Past Medical History:  Diagnosis Date  . Bizarre behavior 12/02/2019   History reviewed. No pertinent surgical history. Family History: History reviewed. No pertinent family history. Family Psychiatric  History: None reported Social History:  Social History   Substance and Sexual Activity  Alcohol Use None     Social History   Substance and Sexual Activity  Drug Use Not on file    Social History   Socioeconomic History  . Marital status: Single    Spouse name: Not on file  . Number of children: Not on file  . Years of education: Not on file  . Highest education level: Not on file  Occupational History  . Not on file  Tobacco Use  . Smoking status: Never Smoker  . Smokeless tobacco: Never Used  Substance and Sexual Activity  . Alcohol use: Not on file  . Drug use: Not on file  . Sexual activity: Not on file  Other Topics Concern  . Not on  file  Social History Narrative  . Not on file   Social Determinants of Health   Financial Resource Strain:   . Difficulty of Paying Living Expenses:   Food Insecurity:   . Worried About Programme researcher, broadcasting/film/video in the Last Year:   . Barista in the Last Year:   Transportation Needs:   . Freight forwarder (Medical):   Marland Kitchen Lack of Transportation (Non-Medical):   Physical Activity:   . Days of Exercise per Week:   . Minutes of Exercise per Session:   Stress:   . Feeling of Stress :   Social Connections:   . Frequency of Communication with Friends and Family:   . Frequency of Social Gatherings with Friends and Family:   . Attends Religious Services:   . Active Member of Clubs or Organizations:   . Attends Banker Meetings:   Marland Kitchen Marital Status:    Additional Social History:                         Sleep: Good  Appetite:  Good  Current Medications: Current Facility-Administered Medications  Medication Dose Route Frequency Provider Last Rate Last Admin  . acetaminophen (TYLENOL) tablet 650 mg  650 mg Oral Q6H PRN Dixon, Rashaun M, NP      . alum & mag hydroxide-simeth (MAALOX/MYLANTA) 200-200-20 MG/5ML suspension 30 mL  30 mL Oral Q4H PRN Dixon, Rashaun M, NP      . benztropine (COGENTIN) tablet 0.5 mg  0.5 mg Oral BID PRN Clapacs, John T, MD      . haloperidol (HALDOL) tablet 5 mg  5 mg Oral BID Charlee Whitebread, Lowry Ram, FNP      . hydrOXYzine (ATARAX/VISTARIL) tablet 25 mg  25 mg Oral TID PRN Dixon, Ernst Bowler, NP      . magnesium hydroxide (MILK OF MAGNESIA) suspension 30 mL  30 mL Oral Daily PRN Dixon, Ernst Bowler, NP      . traZODone (DESYREL) tablet 50 mg  50 mg Oral QHS PRN Deloria Lair, NP        Lab Results: No results found for this or any previous visit (from the past 48 hour(s)).  Blood Alcohol level:  Lab Results  Component Value Date   ETH <10 84/13/2440    Metabolic Disorder Labs: Lab Results  Component Value Date   HGBA1C 5.1  12/03/2019   MPG 99.67 12/03/2019   No results found for: PROLACTIN No results found for: CHOL, TRIG, HDL, CHOLHDL, VLDL, LDLCALC  Physical Findings: AIMS:  , ,  ,  ,    CIWA:    COWS:     Musculoskeletal: Strength & Muscle Tone: within normal limits Gait & Station: normal Patient leans: N/A  Psychiatric Specialty Exam: Physical Exam  Nursing note and vitals reviewed. Constitutional: He is oriented to person, place, and time. He appears well-developed and well-nourished.  Cardiovascular: Normal rate.  Respiratory: Effort normal.  Musculoskeletal:        General: Normal range of motion.  Neurological: He is alert and oriented to person, place, and time.  Skin: Skin is warm.  Psychiatric: His affect is inappropriate. He is withdrawn. He expresses inappropriate judgment.    Review of Systems  Constitutional: Negative.   HENT: Negative.   Eyes: Negative.   Respiratory: Negative.   Cardiovascular: Negative.   Gastrointestinal: Negative.   Genitourinary: Negative.   Musculoskeletal: Negative.   Skin: Negative.   Neurological: Negative.   Psychiatric/Behavioral: Negative.     Blood pressure 119/79, pulse 72, temperature 98.4 F (36.9 C), temperature source Oral, resp. rate 16, height 5\' 8"  (1.727 m), weight 71.7 kg, SpO2 94 %.Body mass index is 24.02 kg/m.  General Appearance: Casual  Eye Contact:  Good  Speech:  Clear and Coherent and Slow  Volume:  Decreased  Mood:  Euthymic  Affect:  Constricted and Inappropriate  Thought Process:  Disorganized and Descriptions of Associations: Circumstantial  Orientation:  Negative  Thought Content:  Illogical, Rumination and Tangential  Suicidal Thoughts:  No  Homicidal Thoughts:  No  Memory:  Immediate;   Poor Recent;   Poor Remote;   Poor  Judgement:  Impaired  Insight:  Shallow  Psychomotor Activity:  Decreased  Concentration:  Concentration: Poor  Recall:  Poor  Fund of Knowledge:  Poor  Language:  Poor  Akathisia:   No  Handed:  Right  AIMS (if indicated):     Assets:  Housing Physical Health Resilience  ADL's:  Impaired  Cognition:  Impaired,  Mild  Sleep:  Number of Hours: 8.15   Assessment: Patient presents walking down the hallway and we go to his room to talk. Patient is informed that he may sit down if he would like to and patient stated that we would stand to have a conversation with family stated standing next to the doorway. Patient seems to have some thought blocking with little information shared during the evaluation. Patient also was asked if he is having hallucinations and patient started  smiling and then said no and then went back to a constricted affect. Patient appears to be trying to hide some of his symptoms by keeping communication decreased. Patient feels that he needs to be discharged however, patient does not appear to be stable enough to make logical decisions and to care for himself at this time.  Treatment Plan Summary: Daily contact with patient to assess and evaluate symptoms and progress in treatment and Medication management Continue Cogentin 0.5 mg p.o. twice daily as needed for tremors Increase Haldol 5 mg p.o. twice daily for psychosis Continue Vistaril 25 mg 3 times daily as needed for anxiety Continue trazodone 50 mg p.o. nightly as needed for insomnia Encourage group therapy participation Continue every 15 minute safety checks  Maryfrances Bunnell, FNP 12/06/2019, 10:59 AM

## 2019-12-06 NOTE — [facility] Group Notes (Signed)
[facility] Group Notes:  (Nursing/MHT/Case Management/Adjunct)  Date:  12/06/2019  Time:  9:02 PM  Type of Therapy:    Participation Level:  Did Not Attend  Participation Quality:    Affect:    Cognitive:    Insight:    Engagement in Group:    Modes of Intervention:    Summary of Progress/Problems:  Eric Pineda 12/06/2019, 9:02 PM

## 2019-12-06 NOTE — Plan of Care (Signed)
Pt denies depression, anxiety, SI, HI and AVH. Pt was educated on care plan and verbalizes understanding. Katasha Riga RN Problem: Education: Goal: Knowledge of [facility] Education information/materials will improve Outcome: Progressing Goal: Emotional status will improve Outcome: Progressing Goal: Mental status will improve Outcome: Progressing Goal: Verbalization of understanding the information provided will improve Outcome: Progressing   Problem: Activity: Goal: Interest or engagement in activities will improve Outcome: Progressing Goal: Sleeping patterns will improve Outcome: Progressing   Problem: Coping: Goal: Ability to verbalize frustrations and anger appropriately will improve Outcome: Progressing Goal: Ability to demonstrate self-control will improve Outcome: Progressing   Problem: Health Behavior/Discharge Planning: Goal: Identification of resources available to assist in meeting health care needs will improve Outcome: Progressing Goal: Compliance with treatment plan for underlying cause of condition will improve Outcome: Progressing   Problem: Physical Regulation: Goal: Ability to maintain clinical measurements within normal limits will improve Outcome: Progressing   Problem: Safety: Goal: Periods of time without injury will increase Outcome: Progressing   

## 2019-12-06 NOTE — [facility] Suicide Risk Assessment (Addendum)
[facility] INPATIENT:  Family/Significant Other Suicide Prevention Education  Suicide Prevention Education:  Education Completed; Kahiau Schewe, father 12 [phone]  has been identified by the patient as the family member/significant other with whom the patient will be residing, and identified as the person(s) who will aid the patient in the event of a mental health crisis (suicidal ideations/suicide attempt).  With written consent from the patient, the family member/significant other has been provided the following suicide prevention education, prior to the and/or following the discharge of the patient.  The suicide prevention education provided includes the following:  Suicide risk factors  Suicide prevention and interventions  National Suicide Hotline telephone number  [facility] assessment telephone number  [facility] Emergency Assistance 911  [facility] and/or Residential Mobile Crisis Unit telephone number  Request made of family/significant other to:  Remove weapons (e.g., guns, rifles, knives), all items previously/currently identified as safety concern.    Remove drugs/medications (over-the-counter, prescriptions, illicit drugs), all items previously/currently identified as a safety concern.  The family member/significant other verbalizes understanding of the suicide prevention education information provided.  The family member/significant other agrees to remove the items of safety concern listed above. CSW contacted interpreter service and spoke with Annabell LY#[id number]. Mr. Vandyne reports the patient "has problems with drugs" and this is the reason why he was brought to the hospital. Mr. Holsinger reports the pt was in [place] for a period of time and while there went to rehab two times. He states since his return home "he has been acting strange." He says the pt "drank a bottle of rubbing alcohol" and was hospitalized in [place]. He denies the pt having access to guns  or weapons in the home. He states the pt does not have a mental health provider.  Abdulah Iqbal T Willadene Mounsey 12/06/2019, 2:42 PM

## 2019-12-07 NOTE — Plan of Care (Signed)
Pt presents with the same affect, minimal with staff and peers on the unit  Problem: Education: Goal: Emotional status will improve Outcome: Not Progressing Goal: Mental status will improve Outcome: Not Progressing

## 2019-12-07 NOTE — [facility] Group Notes (Signed)
[facility] Group Notes:  (Nursing/MHT/Case Management/Adjunct)  Date:  12/07/2019  Time:  11:16 PM  Type of Therapy:  Group Therapy  Participation Level:  Did Not Attend    Landry Mellow 12/07/2019, 11:16 PM

## 2019-12-07 NOTE — Progress Notes (Signed)
Patient was in his room upon arrival to the unit. Patient pleasant during assessment denying SI/HI/AVH and anxiety. Pt endorses depression. Pt continues to be isolative to his room and minimal with staff and peers. Pt doesn't have any medications scheduled per MD orders this evening and didn't request anything PRN from this writer. Patient given education, support and encouragement to be active in his treatment plan. Patient isolative to his room and minimal with staff this evening. Patient being monitored Q 15 minutes for safety per unit protocol, pt remains safe on the unit.

## 2019-12-07 NOTE — [facility] Group Notes (Signed)
LCSW Group Therapy Note  12/07/2019 12:45 PM  Type of Therapy/Topic:  Group Therapy:  Balance in Life  Participation Level:  Did Not Attend  Description of Group:    This group will address the concept of balance and how it feels and looks when one is unbalanced. Patients will be encouraged to process areas in their lives that are out of balance and identify reasons for remaining unbalanced. Facilitators will guide patients in utilizing problem-solving interventions to address and correct the stressor making their life unbalanced. Understanding and applying boundaries will be explored and addressed for obtaining and maintaining a balanced life. Patients will be encouraged to explore ways to assertively make their unbalanced needs known to significant others in their lives, using other group members and facilitator for support and feedback.  Therapeutic Goals: 1. Patient will identify two or more emotions or situations they have that consume much of in their lives. 2. Patient will identify signs/triggers that life has become out of balance:  3. Patient will identify two ways to set boundaries in order to achieve balance in their lives:  4. Patient will demonstrate ability to communicate their needs through discussion and/or role plays  Summary of Patient Progress: x     Therapeutic Modalities:   Cognitive Behavioral Therapy Solution-Focused Therapy Assertiveness Training  Iris Pert, MSW, LCSW Clinical Social Work 12/07/2019 12:45 PM

## 2019-12-07 NOTE — Plan of Care (Signed)
Pt denies depression, anxiety, SI, HI and AVH. Pt was educated on care plan and verbalizes understanding. Pt was encouraged to attend groups. Torrie Mayers RN Problem: Education: Goal: Knowledge of [facility] Education information/materials will improve Outcome: Progressing Goal: Emotional status will improve Outcome: Progressing Goal: Mental status will improve Outcome: Not Progressing Goal: Verbalization of understanding the information provided will improve Outcome: Not Progressing   Problem: Activity: Goal: Interest or engagement in activities will improve Outcome: Not Progressing Goal: Sleeping patterns will improve Outcome: Not Progressing   Problem: Coping: Goal: Ability to verbalize frustrations and anger appropriately will improve Outcome: Progressing Goal: Ability to demonstrate self-control will improve Outcome: Not Progressing   Problem: Health Behavior/Discharge Planning: Goal: Identification of resources available to assist in meeting health care needs will improve Outcome: Not Progressing Goal: Compliance with treatment plan for underlying cause of condition will improve Outcome: Progressing   Problem: Physical Regulation: Goal: Ability to maintain clinical measurements within normal limits will improve Outcome: Progressing   Problem: Safety: Goal: Periods of time without injury will increase Outcome: Progressing

## 2019-12-07 NOTE — Progress Notes (Signed)
[facility] MD Progress Note  12/07/2019 4:24 PM Eric Pineda  MRN:  [id number] Subjective: Patient seen and chart reviewed.  24 year old man with what appears to be schizophreniform disorder.  I tried to meet with the patient today.  He was flat on his back in bed.  He responded to my questions about half of the time but would only respond with single words mostly non sequiturs.  Would not give me any elaboration about any recent history.  Had an odd smile on his face throughout.  His vitals are stable and he has been compliant with medication.  He interacts very little on the unit Principal Problem: Schizophreniform disorder (HCC) Diagnosis: Principal Problem:   Schizophreniform disorder (HCC)  Total Time spent with patient: 30 minutes  Past Psychiatric History: Patient has a past history of substance abuse no known psychosis however  Past Medical History:  Past Medical History:  Diagnosis Date  . Bizarre behavior 12/02/2019   History reviewed. No pertinent surgical history. Family History: History reviewed. No pertinent family history. Family Psychiatric  History: See previous Social History:  Social History   Substance and Sexual Activity  Alcohol Use None     Social History   Substance and Sexual Activity  Drug Use Not on file    Social History   Socioeconomic History  . Marital status: Single    Spouse name: Not on file  . Number of children: Not on file  . Years of education: Not on file  . Highest education level: Not on file  Occupational History  . Not on file  Tobacco Use  . Smoking status: Never Smoker  . Smokeless tobacco: Never Used  Substance and Sexual Activity  . Alcohol use: Not on file  . Drug use: Not on file  . Sexual activity: Not on file  Other Topics Concern  . Not on file  Social History Narrative  . Not on file   Social Determinants of Health   Financial Resource Strain:   . Difficulty of Paying Living Expenses:   Food Insecurity:   . Worried  About Programme researcher, broadcasting/film/video in the Last Year:   . Barista in the Last Year:   Transportation Needs:   . Freight forwarder (Medical):   Marland Kitchen Lack of Transportation (Non-Medical):   Physical Activity:   . Days of Exercise per Week:   . Minutes of Exercise per Session:   Stress:   . Feeling of Stress :   Social Connections:   . Frequency of Communication with Friends and Family:   . Frequency of Social Gatherings with Friends and Family:   . Attends Religious Services:   . Active Member of Clubs or Organizations:   . Attends Banker Meetings:   Marland Kitchen Marital Status:    Additional Social History:                         Sleep: Fair  Appetite:  Fair  Current Medications: Current Facility-Administered Medications  Medication Dose Route Frequency Provider Last Rate Last Admin  . acetaminophen (TYLENOL) tablet 650 mg  650 mg Oral Q6H PRN Dixon, Rashaun M, NP      . alum & mag hydroxide-simeth (MAALOX/MYLANTA) 200-200-20 MG/5ML suspension 30 mL  30 mL Oral Q4H PRN Dixon, Rashaun M, NP      . benztropine (COGENTIN) tablet 0.5 mg  0.5 mg Oral BID PRN Myrna Vonseggern, Jackquline Denmark, MD      .  haloperidol (HALDOL) tablet 5 mg  5 mg Oral BID Money, Gerlene Burdock, FNP   5 mg at 12/07/19 9485  . hydrOXYzine (ATARAX/VISTARIL) tablet 25 mg  25 mg Oral TID PRN Jearld Lesch, NP      . magnesium hydroxide (MILK OF MAGNESIA) suspension 30 mL  30 mL Oral Daily PRN Dixon, Elray Buba, NP      . traZODone (DESYREL) tablet 50 mg  50 mg Oral QHS PRN Jearld Lesch, NP        Lab Results: No results found for this or any previous visit (from the past 48 hour(s)).  Blood Alcohol level:  Lab Results  Component Value Date   ETH <10 12/01/2019    Metabolic Disorder Labs: Lab Results  Component Value Date   HGBA1C 5.1 12/03/2019   MPG 99.67 12/03/2019   No results found for: PROLACTIN No results found for: CHOL, TRIG, HDL, CHOLHDL, VLDL, LDLCALC  Physical Findings: AIMS:  , ,  ,  ,     CIWA:    COWS:     Musculoskeletal: Strength & Muscle Tone: decreased Gait & Station: normal Patient leans: N/A  Psychiatric Specialty Exam: Physical Exam  Nursing note and vitals reviewed. Constitutional: He appears well-developed and well-nourished.  HENT:  Head: Normocephalic and atraumatic.  Eyes: Pupils are equal, round, and reactive to light. Conjunctivae are normal.  Cardiovascular: Regular rhythm and normal heart sounds.  Respiratory: Effort normal. No respiratory distress.  GI: Soft.  Musculoskeletal:        General: Normal range of motion.     Cervical back: Normal range of motion.  Neurological: He is alert.  Skin: Skin is warm and dry.  Psychiatric: His affect is blunt and inappropriate. His speech is delayed. He is slowed. Cognition and memory are impaired. He expresses no homicidal and no suicidal ideation.    Review of Systems  Unable to perform ROS: Psychiatric disorder    Blood pressure 122/78, pulse 76, temperature 97.8 F (36.6 C), temperature source Oral, resp. rate 16, height 5\' 8"  (1.727 m), weight 71.7 kg, SpO2 99 %.Body mass index is 24.02 kg/m.  General Appearance: Disheveled  Eye Contact:  Minimal  Speech:  Slow  Volume:  Decreased  Mood:  Euthymic  Affect:  Inappropriate  Thought Process:  Disorganized  Orientation:  Full (Time, Place, and Person)  Thought Content:  Illogical and Tangential  Suicidal Thoughts:  No  Homicidal Thoughts:  No  Memory:  Immediate;   Fair Recent;   Poor Remote;   Fair  Judgement:  Impaired  Insight:  Shallow  Psychomotor Activity:  Decreased  Concentration:  Concentration: Poor  Recall:  Poor  Fund of Knowledge:  Poor  Language:  Poor  Akathisia:  No  Handed:  Right  AIMS (if indicated):     Assets:  Desire for Improvement Housing Physical Health  ADL's:  Impaired  Cognition:  Impaired,  Mild  Sleep:  Number of Hours: 8.25     Treatment Plan Summary: Daily contact with patient to assess and  evaluate symptoms and progress in treatment, Medication management and Plan Patient seen and chart reviewed.  Patient who appears to have schizophrenia or schizophreniform disorder.  Very flat noncommunicative unable to give any lucid history.  He does eat and appears to be taking his medicine.  Dose of Haldol is now at 5 mg twice a day.  We will try to get in touch with his family to give them an update about what  is going on as well.  No change in treatment plan for today.  Alethia Berthold, MD 12/07/2019, 4:24 PM

## 2019-12-07 NOTE — Progress Notes (Signed)
Recreation Therapy Notes   Date: 12/07/2019  Time: 9:30 am   Location: Craft room   Behavioral response: N/A   Intervention Topic: Goals   Discussion/Intervention: Patient did not attend group.   Clinical Observations/Feedback:  Patient did not attend group.   Jarret Torre LRT/CTRS        Rupa Lagan 12/07/2019 11:41 AM

## 2019-12-07 NOTE — Progress Notes (Signed)
Pt remains isolative in is room. Pt has no complaints. Torrie Mayers RN

## 2019-12-08 MED ORDER — OLANZAPINE 5 MG PO TBDP
10.0000 mg | ORAL_TABLET | Freq: Every day | ORAL | Status: DC
  Administered 2019-12-08 – 2019-12-10 (×3): 10 mg via ORAL
  Filled 2019-12-08 (×3): qty 2

## 2019-12-08 NOTE — [facility] Group Notes (Signed)
[facility] Group Notes:  (Nursing/MHT/Case Management/Adjunct)  Date:  12/08/2019  Time:  9:04 AM  Type of Therapy:  Community Meeting  Participation Level:  Did Not Attend   Lynelle Smoke [facility] 12/08/2019, 9:04 AM

## 2019-12-08 NOTE — Plan of Care (Signed)
D- Patient alert and oriented. Patient presents in a pleasant mood on assessment stating that he slept good last night and had no complaints to voice to this Clinical research associate. Patient denies any signs/symptoms of depression/anxiety, reporting that overall, he is feeling "pretty good". Patient also denies SI, HI, AVH, and pain at this time. Patient had no stated goals for today. Patient has been observed out in the hallways doing some ritualistic steps and head turns before returning to his room.   A- Scheduled medications administered to patient, per MD orders. Support and encouragement provided.  Routine safety checks conducted every 15 minutes.  Patient informed to notify staff with problems or concerns.  R- No adverse drug reactions noted. Patient contracts for safety at this time. Patient compliant with medications and treatment plan. Patient receptive, calm, and cooperative. Patient interacts well with others on the unit.  Patient remains safe at this time.  Problem: Education: Goal: Knowledge of [facility] Education information/materials will improve Outcome: Progressing Goal: Emotional status will improve Outcome: Progressing Goal: Mental status will improve Outcome: Progressing Goal: Verbalization of understanding the information provided will improve Outcome: Progressing   Problem: Activity: Goal: Interest or engagement in activities will improve Outcome: Progressing Goal: Sleeping patterns will improve Outcome: Progressing   Problem: Coping: Goal: Ability to verbalize frustrations and anger appropriately will improve Outcome: Progressing Goal: Ability to demonstrate self-control will improve Outcome: Progressing   Problem: Health Behavior/Discharge Planning: Goal: Identification of resources available to assist in meeting health care needs will improve Outcome: Progressing Goal: Compliance with treatment plan for underlying cause of condition will improve Outcome:  Progressing   Problem: Physical Regulation: Goal: Ability to maintain clinical measurements within normal limits will improve Outcome: Progressing   Problem: Safety: Goal: Periods of time without injury will increase Outcome: Progressing

## 2019-12-08 NOTE — Plan of Care (Signed)
Pt presents the same, flat but pleasant. Pt compliant with medication administration per MD orders  Problem: Education: Goal: Emotional status will improve Outcome: Not Progressing Goal: Mental status will improve Outcome: Not Progressing

## 2019-12-08 NOTE — Progress Notes (Signed)
Patient was in his room upon arrival to the unit. Patient pleasant during assessment denying SI/HI/AVH and anxiety. Pt endorses depression. Pt continues to be isolative to his room and minimal with staff and peers. Pt compliant with medication administration per MD orders. Patient given education, support and encouragement to be active in his treatment plan. Patient isolative to his room and minimal with staff this evening. Patient being monitored Q 15 minutes for safety per unit protocol, pt remains safe on the unit.

## 2019-12-08 NOTE — Progress Notes (Signed)
Patient came up to this writer and asked for a new pair of scrubs, however, this writer was able to direct patient to the laundry room to wash one of the sets he has in his possession. Patient has also been observed in the dayroom coloring, while the other members on the unit are outside in the courtyard. Patient has been more visible today that previous days on the unit.

## 2019-12-08 NOTE — [facility] Group Notes (Signed)
LCSW Group Therapy Note  12/08/2019 2:18 PM  Type of Therapy and Topic:  Group Therapy:  Feelings around Relapse and Recovery  Participation Level:  Did Not Attend   Description of Group:    Patients in this group will discuss emotions they experience before and after a relapse. They will process how experiencing these feelings, or avoidance of experiencing them, relates to having a relapse. Facilitator will guide patients to explore emotions they have related to recovery. Patients will be encouraged to process which emotions are more powerful. They will be guided to discuss the emotional reaction significant others in their lives may have to their relapse or recovery. Patients will be assisted in exploring ways to respond to the emotions of others without this contributing to a relapse.  Therapeutic Goals: 1. Patient will identify two or more emotions that lead to a relapse for them 2. Patient will identify two emotions that result when they relapse 3. Patient will identify two emotions related to recovery 4. Patient will demonstrate ability to communicate their needs through discussion and/or role plays   Summary of Patient Progress: X  Therapeutic Modalities:   Cognitive Behavioral Therapy Solution-Focused Therapy Assertiveness Training Relapse Prevention Therapy   Penni Homans, MSW, LCSW 12/08/2019 2:18 PM

## 2019-12-08 NOTE — Progress Notes (Signed)
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  Once again the patient seems unchanged in his mental state.  I find him in bed lying down staring at the ceiling.  He responds to questions with single words that often bear no relationship to the question that was asked.  Cannot give me any information about his life before the hospitalization nor will he answer open ended questions about what he plans to do in the future.  Continues to have strange ritualized behaviors at times.  Not violent or threatening.  He had been recorded that he had been compliant so far with medication. Principal Problem: Schizophreniform disorder ([place]) Diagnosis: Principal Problem:   Schizophreniform disorder ([place])  Total Time spent with patient: 30 minutes  Past Psychiatric History: No known history except for substance abuse  Past Medical History:  Past Medical History:  Diagnosis Date  . Bizarre behavior 12/02/2019   History reviewed. No pertinent surgical history. Family History: History reviewed. No pertinent family history. Family Psychiatric  History: See previous Social History:  Social History   Substance and Sexual Activity  Alcohol Use None     Social History   Substance and Sexual Activity  Drug Use Not on file    Social History   Socioeconomic History  . Marital status: Single    Spouse name: Not on file  . Number of children: Not on file  . Years of education: Not on file  . Highest education level: Not on file  Occupational History  . Not on file  Tobacco Use  . Smoking status: Never Smoker  . Smokeless tobacco: Never Used  Substance and Sexual Activity  . Alcohol use: Not on file  . Drug use: Not on file  . Sexual activity: Not on file  Other Topics Concern  . Not on file  Social History Narrative  . Not on file   Social Determinants of Health    Financial Resource Strain:   . Difficulty of Paying Living Expenses:   Food Insecurity:   . Worried About Charity fundraiser in the Last Year:   . Arboriculturist in the Last Year:   Transportation Needs:   . Film/video editor (Medical):   Marland Kitchen Lack of Transportation (Non-Medical):   Physical Activity:   . Days of Exercise per Week:   . Minutes of Exercise per Session:   Stress:   . Feeling of Stress :   Social Connections:   . Frequency of Communication with Friends and Family:   . Frequency of Social Gatherings with Friends and Family:   . Attends Religious Services:   . Active Member of Clubs or Organizations:   . Attends Archivist Meetings:   Marland Kitchen Marital Status:    Additional Social History:                         Sleep: Fair  Appetite:  Fair  Current Medications: Current Facility-Administered Medications  Medication Dose Route Frequency Provider Last Rate Last Admin  . acetaminophen (TYLENOL) tablet 650 mg  650 mg Oral Q6H PRN Dixon, Rashaun M, NP      . alum & mag hydroxide-simeth (MAALOX/MYLANTA) 200-200-20 MG/5ML suspension 30 mL  30 mL Oral Q4H PRN Dixon, Rashaun M, NP      . benztropine (COGENTIN) tablet 0.5 mg  0.5 mg Oral  BID PRN Aizik Reh, Jackquline Denmark, MD      . haloperidol (HALDOL) tablet 5 mg  5 mg Oral BID Money, Gerlene Burdock, FNP   5 mg at 12/08/19 0086  . hydrOXYzine (ATARAX/VISTARIL) tablet 25 mg  25 mg Oral TID PRN Jearld Lesch, NP      . magnesium hydroxide (MILK OF MAGNESIA) suspension 30 mL  30 mL Oral Daily PRN Dixon, Elray Buba, NP      . traZODone (DESYREL) tablet 50 mg  50 mg Oral QHS PRN Jearld Lesch, NP        Lab Results: No results found for this or any previous visit (from the past 48 hour(s)).  Blood Alcohol level:  Lab Results  Component Value Date   ETH <10 12/01/2019    Metabolic Disorder Labs: Lab Results  Component Value Date   HGBA1C 5.1 12/03/2019   MPG 99.67 12/03/2019   No results found for:  PROLACTIN No results found for: CHOL, TRIG, HDL, CHOLHDL, VLDL, LDLCALC  Physical Findings: AIMS:  , ,  ,  ,    CIWA:    COWS:     Musculoskeletal: Strength & Muscle Tone: within normal limits Gait & Station: normal Patient leans: N/A  Psychiatric Specialty Exam: Physical Exam  Nursing note and vitals reviewed. Constitutional: He appears well-developed and well-nourished.  HENT:  Head: Normocephalic and atraumatic.  Eyes: Pupils are equal, round, and reactive to light. Conjunctivae are normal.  Cardiovascular: Regular rhythm and normal heart sounds.  Respiratory: Effort normal.  GI: Soft.  Musculoskeletal:        General: Normal range of motion.     Cervical back: Normal range of motion.  Neurological: He is alert.  Skin: Skin is warm and dry.  Psychiatric: His affect is blunt. He is withdrawn. He is not agitated and not aggressive. Cognition and memory are impaired. He expresses inappropriate judgment. He is noncommunicative.    Review of Systems  Unable to perform ROS: Psychiatric disorder    Blood pressure 119/85, pulse 65, temperature 97.8 F (36.6 C), temperature source Oral, resp. rate 17, height 5\' 8"  (1.727 m), weight 71.7 kg, SpO2 99 %.Body mass index is 24.02 kg/m.  General Appearance: Casual  Eye Contact:  Minimal  Speech:  Slow  Volume:  Decreased  Mood:  Euthymic  Affect:  Constricted and Flat  Thought Process:  NA  Orientation:  Negative  Thought Content:  Negative  Suicidal Thoughts:  No  Homicidal Thoughts:  No  Memory:  Negative  Judgement:  Negative  Insight:  Negative  Psychomotor Activity:  Negative  Concentration:  Concentration: Negative  Recall:  Negative  Fund of Knowledge:  Negative  Language:  Negative  Akathisia:  No  Handed:  Right  AIMS (if indicated):     Assets:  Physical Health  ADL's:  Impaired  Cognition:  Impaired,  Mild  Sleep:  Number of Hours: 8.15     Treatment Plan Summary: Daily contact with patient to assess  and evaluate symptoms and progress in treatment, Medication management and Plan I am going to switch his Haldol to olanzapine oral dissolving to possibly avoid any risk of cheeking medication.  Reviewed all of this with patient and reviewed with him what the goals were even though I got no answer from him.  , MD 12/08/2019, 3:49 PM

## 2019-12-08 NOTE — Progress Notes (Signed)
Recreation Therapy Notes   Date: 12/08/2019  Time: 9:30 am   Location: Craft room   Behavioral response: N/A   Intervention Topic: Teamwork   Discussion/Intervention: Patient did not attend group.   Clinical Observations/Feedback:  Patient did not attend group.   Ainsleigh Kakos LRT/CTRS        Marasia Newhall 12/08/2019 11:52 AM

## 2019-12-09 NOTE — Progress Notes (Signed)
The patient was pleasant upon contact, but mostly kept to himself while pacing the hallways and watching television.  Eric Pineda was brief when communicating and denied any issues or concerns.  The patient continues to display gross motor rigidity while ambulating and repetitive movements.  Fine motor skills appear at baseline and adequate.  Problematic side effects from recent medication changes denied.  Eric Pineda did not attend groups.    It was difficult to determine if the patient was responding to internal stimuli or was experiencing delusional thoughts.  He denied.   Recent change to Zyprexa noted.

## 2019-12-09 NOTE — [facility] Group Notes (Addendum)
[facility] Group Notes:  (Nursing/MHT/Case Management/Adjunct)  Date:  12/09/2019  Time:  9:15 PM  Type of Therapy:  Group Therapy  Participation Level:  Did Not Attend  Summary of Progress/Problems:  Eric Pineda 12/09/2019, 9:15 PM

## 2019-12-09 NOTE — [facility] Group Notes (Signed)
LCSW Group Therapy Note  12/09/2019 1:00pm  Type of Therapy and Topic:  Group Therapy:  Cognitive Distortions  Participation Level:  Did Not Attend   Description of Group:    Patients in this group will be introduced to the topic of cognitive distortions.  Patients will identify and describe cognitive distortions, describe the feelings these distortions create for them.  Patients will identify one or more situations in their personal life where they have cognitively distorted thinking and will verbalize challenging this cognitive distortion through positive thinking skills.  Patients will practice the skill of using positive affirmations to challenge cognitive distortions using affirmation cards.    Therapeutic Goals:  1. Patient will identify two or more cognitive distortions they have used 2. Patient will identify one or more emotions that stem from use of a cognitive distortion 3. Patient will demonstrate use of a positive affirmation to counter a cognitive distortion through discussion and/or role play. 4. Patient will describe one way cognitive distortions can be detrimental to wellness   Summary of Patient Progress:  X   Therapeutic Modalities:   Cognitive Behavioral Therapy Motivational Interviewing   Teresita Madura, MSW, [facility] Clinical Social Worker  12/09/2019 2:19 PM

## 2019-12-09 NOTE — Progress Notes (Signed)
Eric Pineda spent time during the evening working on a jigsaw puzzle.  Interactions with peer and staff were brief.  The patient minimized symptoms of psychosis and denied thoughts of harming himself.  Eric Pineda displayed occasional episodes of repetitive gross motor movements.  Insight remains poor.

## 2019-12-09 NOTE — Progress Notes (Signed)
Pt refused VS this morning, pt given education, pt still refused

## 2019-12-09 NOTE — Plan of Care (Signed)
The patient provides little information to staff.    Problem: Activity: Goal: Interest or engagement in activities will improve Outcome: Progressing   Problem: Health Behavior/Discharge Planning: Goal: Compliance with treatment plan for underlying cause of condition will improve Outcome: Progressing

## 2019-12-09 NOTE — Progress Notes (Signed)
[facility] MD Progress Note  12/09/2019 12:03 PM Eric Pineda  MRN:  [id number] Subjective: Patient is a 24 year old male admitted on 12/03/2019 under involuntary commitment.  The paperwork described the patient as having a recent severe mental status change.  He had become withdrawn and bizarre.  Objective: Patient is seen and examined.  Patient is a 24 year old male with a reported past psychiatric history significant for schizophreniform disorder.  He is seen in follow-up.  The patient answers all questions with one-word answers that make no sense with regard to the questions.  Review of the electronic medical record showed that this is no different than his examination from yesterday.  He was switched from Haldol to olanzapine yesterday.  His vital signs are stable, he is afebrile.  He slept 9 hours last night.  Review of his laboratories revealed basically a normal electrolyte pattern, normal CBC, acetaminophen and salicylate were negative.  His TSH and hemoglobin A1c were both considered normal.  Blood alcohol on admission was less than 10.  Drug screen was completely negative.  He had a CT scan of the head done on 12/03/2019 that was essentially normal.  His EKG showed a sinus rhythm with a normal QTc interval.  Besides the Zyprexa he continues on Cogentin, hydroxyzine and trazodone.  Principal Problem: Schizophreniform disorder ([facility]) Diagnosis: Principal Problem:   Schizophreniform disorder ([facility])  Total Time spent with patient: 20 minutes  Past Psychiatric History: See admission H&P  Past Medical History:  Past Medical History:  Diagnosis Date  . Bizarre behavior 12/02/2019   History reviewed. No pertinent surgical history. Family History: History reviewed. No pertinent family history. Family Psychiatric  History: See admission H&P Social History:  Social History   Substance and Sexual Activity  Alcohol Use None     Social History   Substance and Sexual Activity  Drug Use Not on file    Social History   Socioeconomic History  . Marital status: Single    Spouse name: Not on file  . Number of children: Not on file  . Years of education: Not on file  . Highest education level: Not on file  Occupational History  . Not on file  Tobacco Use  . Smoking status: Never Smoker  . Smokeless tobacco: Never Used  Substance and Sexual Activity  . Alcohol use: Not on file  . Drug use: Not on file  . Sexual activity: Not on file  Other Topics Concern  . Not on file  Social History Narrative  . Not on file   Social Determinants of Health   Financial Resource Strain:   . Difficulty of Paying Living Expenses:   Food Insecurity:   . Worried About Programme researcher, broadcasting/film/video in the Last Year:   . Barista in the Last Year:   Transportation Needs:   . Freight forwarder (Medical):   Marland Kitchen Lack of Transportation (Non-Medical):   Physical Activity:   . Days of Exercise per Week:   . Minutes of Exercise per Session:   Stress:   . Feeling of Stress :   Social Connections:   . Frequency of Communication with Friends and Family:   . Frequency of Social Gatherings with Friends and Family:   . Attends Religious Services:   . Active Member of Clubs or Organizations:   . Attends Banker Meetings:   Marland Kitchen Marital Status:    Additional Social History:  Sleep: Good  Appetite:  Good  Current Medications: Current Facility-Administered Medications  Medication Dose Route Frequency Provider Last Rate Last Admin  . acetaminophen (TYLENOL) tablet 650 mg  650 mg Oral Q6H PRN Dixon, Rashaun M, NP      . alum & mag hydroxide-simeth (MAALOX/MYLANTA) 200-200-20 MG/5ML suspension 30 mL  30 mL Oral Q4H PRN Dixon, Rashaun M, NP      . benztropine (COGENTIN) tablet 0.5 mg  0.5 mg Oral BID PRN Clapacs, Madie Reno, MD      . hydrOXYzine (ATARAX/VISTARIL) tablet 25 mg  25 mg Oral TID PRN Deloria Lair, NP      . magnesium hydroxide (MILK OF MAGNESIA)  suspension 30 mL  30 mL Oral Daily PRN Dixon, Rashaun M, NP      . OLANZapine zydis (ZYPREXA) disintegrating tablet 10 mg  10 mg Oral QHS Clapacs, Madie Reno, MD   10 mg at 12/08/19 2140  . traZODone (DESYREL) tablet 50 mg  50 mg Oral QHS PRN Deloria Lair, NP        Lab Results: No results found for this or any previous visit (from the past 48 hour(s)).  Blood Alcohol level:  Lab Results  Component Value Date   ETH <10 35/46/5681    Metabolic Disorder Labs: Lab Results  Component Value Date   HGBA1C 5.1 12/03/2019   MPG 99.67 12/03/2019   No results found for: PROLACTIN No results found for: CHOL, TRIG, HDL, CHOLHDL, VLDL, LDLCALC  Physical Findings: AIMS:  , ,  ,  ,    CIWA:    COWS:     Musculoskeletal: Strength & Muscle Tone: within normal limits Gait & Station: normal Patient leans: N/A  Psychiatric Specialty Exam: Physical Exam  Nursing note and vitals reviewed. Constitutional: He is oriented to person, place, and time. He appears well-developed and well-nourished.  HENT:  Head: Normocephalic and atraumatic.  Respiratory: Effort normal.  Neurological: He is alert and oriented to person, place, and time.    Review of Systems  Blood pressure 119/85, pulse 65, temperature 97.8 F (36.6 C), temperature source Oral, resp. rate 17, height 5\' 8"  (1.727 m), weight 71.7 kg, SpO2 99 %.Body mass index is 24.02 kg/m.  General Appearance: Disheveled  Eye Contact:  Good  Speech:  Blocked  Volume:  Normal  Mood:  Dysphoric  Affect:  Constricted  Thought Process:  Goal Directed and Descriptions of Associations: Circumstantial  Orientation:  Negative  Thought Content:  Delusions and Rumination  Suicidal Thoughts:  No  Homicidal Thoughts:  No  Memory:  Immediate;   Poor Recent;   Poor Remote;   Poor  Judgement:  Impaired  Insight:  Lacking  Psychomotor Activity:  Normal  Concentration:  Concentration: Fair and Attention Span: Fair  Recall:  [employer] of Knowledge:   Poor  Language:  Poor  Akathisia:  Negative  Handed:  Right  AIMS (if indicated):     Assets:  Desire for Improvement Resilience  ADL's:  Impaired  Cognition:  WNL  Sleep:  Number of Hours: 9     Treatment Plan Summary: Daily contact with patient to assess and evaluate symptoms and progress in treatment, Medication management and Plan : Patient is seen and examined.  Patient is a 24 year old male with the above-stated past psychiatric history who is seen in follow-up.   Objective: #1 schizophreniform disorder  Patient is seen in follow-up.  He remains on Zyprexa 10 mg p.o. nightly.  This is basically  only a second day.  He had been on Haldol.  It has had no effect so far, but I am not and I change it at least at this point.  His sleep is good.  It is hard to assess if there is any improvement given his what I consider to be thought blocking at this point.  He does not appear to be depressed, but we may have to add something to augment the Zyprexa if it continues to be difficult to collect history and symptoms.  No change in his medications today, hopefully with a few more days worth of Zyprexa he will begin to improve.  With his drug screen being completely negative I am concerned about the possibility of either bath salts or synthetic marijuana.  1.  Continue Cogentin 0.5 mg p.o. twice daily as needed tremors. 2.  Continue Zyprexa Zydis 10 mg p.o. nightly for psychosis. 3.  Continue trazodone 50 mg p.o. nightly as needed insomnia. 4.  Disposition planning-in progress.  Antonieta Pert, MD 12/09/2019, 12:03 PM

## 2019-12-09 NOTE — Plan of Care (Signed)
Patient displaying increased interactions with peers and staff.

## 2019-12-09 NOTE — [facility] Group Notes (Signed)
[facility] Group Notes:  (Nursing/MHT/Case Management/Adjunct)  Date:  12/09/2019  Time:  9:44 AM  Type of Therapy:  Community Meeting   Participation Level:  Did Not Attend  Summary of Progress/Problems:  Eric Pineda 12/09/2019, 9:44 AM

## 2019-12-10 NOTE — [facility] Group Notes (Signed)
LCSW Group Therapy Notes  Date and Time: 12/10/19 1:00PM  Type of Therapy and Topic: Group Therapy: Healthy Vs. Unhealthy Coping Strategies  Participation Level: [facility] PARTICIPATION LEVEL: Did Not Attend  Description of Group:  In this group, patients will be encouraged to explore their healthy and unhealthy coping strategics. Coping strategies are actions that we take to deal with stress, problems, or uncomfortable emotions in our daily lives. Each patient will be challenged to read some scenarios and discuss the unhealthy and healthy coping strategies within those scenarios. Also, each patient will be challenged to describe current healthy and unhealthy strategies that they use in their own lives and discuss the outcomes and barriers to those strategies. This group will be process-oriented, with patients participating in exploration of their own experiences as well as giving and receiving support and challenge from other group members.  Therapeutic Goals: 1. Patient will identify personal healthy and unhealthy coping strategies. 2. Patient will identify healthy and unhealthy coping strategies, in others, through scenarios.  3. Patient will identify expected outcomes of healthy and unhealthy coping strategies. 4. Patient will identify barriers to using healthy coping strategies.   Summary of Patient Progress:  X   Therapeutic Modalities:  Cognitive Behavioral Therapy Solution Focused Therapy Motivational Interviewing   [place] Siegel, MSW, LCSWA Clinical Social Worker    

## 2019-12-10 NOTE — Plan of Care (Signed)
D- Patient alert and oriented. Patient presents in a pleasant mood on assessment stating that he slept ok last night and had no complaints to voice to this Clinical research associate. Patient denies any signs/symptoms of depression/anxiety. Patient also denies SI, HI, AVH, and pain at this time. Patient had no stated goals for today. Patient did ask this Clinical research associate "do you know how this report is going", regarding his time in the hospital. Patient continues to be visible out in the hallways doing some ritualistic steps and head turns before returning to his room.    A- Support and encouragement provided.  Routine safety checks conducted every 15 minutes.  Patient informed to notify staff with problems or concerns.  R- Patient contracts for safety at this time. Patient receptive, calm, and cooperative. Patient interacts well with others on the unit.  Patient remains safe at this time.  Problem: Education: Goal: Knowledge of [place] [facility] Education information/materials will improve Outcome: Progressing Goal: Emotional status will improve Outcome: Progressing Goal: Mental status will improve Outcome: Progressing Goal: Verbalization of understanding the information provided will improve Outcome: Progressing   Problem: Activity: Goal: Interest or engagement in activities will improve Outcome: Progressing Goal: Sleeping patterns will improve Outcome: Progressing   Problem: Coping: Goal: Ability to verbalize frustrations and anger appropriately will improve Outcome: Progressing Goal: Ability to demonstrate self-control will improve Outcome: Progressing   Problem: Health Behavior/Discharge Planning: Goal: Identification of resources available to assist in meeting health care needs will improve Outcome: Progressing Goal: Compliance with treatment plan for underlying cause of condition will improve Outcome: Progressing   Problem: Physical Regulation: Goal: Ability to maintain clinical measurements within  normal limits will improve Outcome: Progressing   Problem: Safety: Goal: Periods of time without injury will increase Outcome: Progressing

## 2019-12-10 NOTE — Progress Notes (Signed)
[name]  12/10/2019 11:09 AM Eric Pineda  MRN:  [id number] Subjective:  Patient is a 24 year old male admitted on 12/03/2019 under involuntary commitment.  The paperwork described the patient as having a recent severe mental status change.  He had become withdrawn and bizarre.  Objective: Patient is seen and examined.  Patient is a 24 year old male with the above-stated past psychiatric history who is seen in follow-up.  He actually seems a little bit better today.  He is actually tying some words together and normal sentence.  He still is at times bizarre and disorganized, but he is able to give more than 1 word answers to questions.  They are generally appropriate.  He denied any auditory or visual hallucinations.  He denied any suicidal or homicidal ideation.  He denied any side effects to his current medications.  His vital signs are stable, and he is afebrile.  He slept 7.5 hours last night.  No new laboratories.  Principal Problem: Schizophreniform disorder (HCC) Diagnosis: Principal Problem:   Schizophreniform disorder (HCC)  Total Time spent with patient: 20 minutes  Past Psychiatric History: See admission H&P  Past Medical History:  Past Medical History:  Diagnosis Date  . Bizarre behavior 12/02/2019   History reviewed. No pertinent surgical history. Family History: History reviewed. No pertinent family history. Family Psychiatric  History: See admission H&P Social History:  Social History   Substance and Sexual Activity  Alcohol Use None     Social History   Substance and Sexual Activity  Drug Use Not on file    Social History   Socioeconomic History  . Marital status: Single    Spouse name: Not on file  . Number of children: Not on file  . Years of education: Not on file  . Highest education level: Not on file  Occupational History  . Not on file  Tobacco Use  . Smoking status: Never Smoker  . Smokeless tobacco: Never Used  Substance and Sexual  Activity  . Alcohol use: Not on file  . Drug use: Not on file  . Sexual activity: Not on file  Other Topics Concern  . Not on file  Social History Narrative  . Not on file   Social Determinants of Health   Financial Resource Strain:   . Difficulty of Paying Living Expenses:   Food Insecurity:   . Worried About Programme researcher, broadcasting/film/video in the Last Year:   . Barista in the Last Year:   Transportation Needs:   . Freight forwarder (Medical):   Marland Kitchen Lack of Transportation (Non-Medical):   Physical Activity:   . Days of Exercise per Week:   . Minutes of Exercise per Session:   Stress:   . Feeling of Stress :   Social Connections:   . Frequency of Communication with Friends and Family:   . Frequency of Social Gatherings with Friends and Family:   . Attends Religious Services:   . Active Member of Clubs or Organizations:   . Attends Banker Meetings:   Marland Kitchen Marital Status:    Additional Social History:                         Sleep: Good  Appetite:  Good  Current Medications: Current Facility-Administered Medications  Medication Dose Route Frequency Provider Last Rate Last Admin  . acetaminophen (TYLENOL) tablet 650 mg  650 mg Oral Q6H PRN Jearld Lesch, NP      .  alum & mag hydroxide-simeth (MAALOX/MYLANTA) 200-200-20 MG/5ML suspension 30 mL  30 mL Oral Q4H PRN Dixon, Rashaun M, NP      . benztropine (COGENTIN) tablet 0.5 mg  0.5 mg Oral BID PRN Clapacs, Madie Reno, MD      . hydrOXYzine (ATARAX/VISTARIL) tablet 25 mg  25 mg Oral TID PRN Deloria Lair, NP      . magnesium hydroxide (MILK OF MAGNESIA) suspension 30 mL  30 mL Oral Daily PRN Dixon, Rashaun M, NP      . OLANZapine zydis (ZYPREXA) disintegrating tablet 10 mg  10 mg Oral QHS Clapacs, Madie Reno, MD   10 mg at 12/09/19 2121  . traZODone (DESYREL) tablet 50 mg  50 mg Oral QHS PRN Deloria Lair, NP        Lab Results: No results found for this or any previous visit (from the past 48  hour(s)).  Blood Alcohol level:  Lab Results  Component Value Date   ETH <10 38/06/1750    Metabolic Disorder Labs: Lab Results  Component Value Date   HGBA1C 5.1 12/03/2019   MPG 99.67 12/03/2019   No results found for: PROLACTIN No results found for: CHOL, TRIG, HDL, CHOLHDL, VLDL, LDLCALC  Physical Findings: AIMS:  , ,  ,  ,    CIWA:    COWS:     Musculoskeletal: Strength & Muscle Tone: within normal limits Gait & Station: normal Patient leans: N/A  Psychiatric Specialty Exam: Physical Exam  Nursing note and vitals reviewed. Constitutional: He is oriented to person, place, and time. He appears well-developed and well-nourished.  HENT:  Head: Normocephalic and atraumatic.  Respiratory: Effort normal.  Neurological: He is alert and oriented to person, place, and time.    Review of Systems  Blood pressure 119/85, pulse 65, temperature 97.8 F (36.6 C), temperature source Oral, resp. rate 17, height 5\' 8"  (1.727 m), weight 71.7 kg, SpO2 99 %.Body mass index is 24.02 kg/m.  General Appearance: Disheveled  Eye Contact:  Fair  Speech:  Normal Rate  Volume:  Normal  Mood:  Dysphoric  Affect:  Congruent  Thought Process:  Goal Directed and Descriptions of Associations: Circumstantial  Orientation:  Full (Time, Place, and Person)  Thought Content:  WDL  Suicidal Thoughts:  No  Homicidal Thoughts:  No  Memory:  Immediate;   Poor Recent;   Poor Remote;   Poor  Judgement:  Impaired  Insight:  Lacking  Psychomotor Activity:  Normal  Concentration:  Concentration: Fair and Attention Span: Fair  Recall:  [employer] of Knowledge:  Fair  Language:  Good  Akathisia:  Negative  Handed:  Right  AIMS (if indicated):     Assets:  Desire for Improvement Resilience  ADL's:  Intact  Cognition:  WNL  Sleep:  Number of Hours: 7.5     Treatment Plan Summary: Daily contact with patient to assess and evaluate symptoms and progress in treatment, Medication management and  Plan : Patient is seen and examined.  Patient is a 24 year old male with the above-stated past psychiatric history who is seen in follow-up.   Diagnosis: #1 schizophreniform disorder  Patient is seen in follow-up.  He seems a bit better today.  He is able to construct sentences, and generally they are appropriate.  He is still very ill though.  His Zyprexa was increased on 3/19, and we will continue the 10 mg p.o. nightly.  No other changes in his medications at this point.  Hopefully will  continue to improve.   1.  Continue Cogentin 0.5 mg p.o. twice daily as needed tremors. 2.  Continue Zyprexa Zydis 10 mg p.o. nightly for psychosis. 3.  Continue trazodone 50 mg p.o. nightly as needed insomnia. 4.  Disposition planning-in progress.   Antonieta Pert, MD 12/10/2019, 11:09 AM

## 2019-12-10 NOTE — [facility] Group Notes (Signed)
[facility] Group Notes:  (Nursing/MHT/Case Management/Adjunct)  Date:  12/10/2019  Time:  1:00 PM  Type of Therapy:  Psychoeducational Skills  Participation Level:  Did Not Attend  Participation Quality:    Affect:   Cognitive:   Insight:    Engagement in Group:    Modes of Intervention:    Summary of Progress/Problems:  Bethaney Oshana 12/10/2019, 5:35 PM

## 2019-12-10 NOTE — Plan of Care (Signed)
D- Client alert and ambulating independently, doing ritualistic like stepping during ambulation. Client denies  AVH, S1, H1, with "doing fine" and head shake. Has very poor eye contact if any. Uses one or two word answers when asked questions or ignores.  A- Client continues to comply with medication and treatment orders. Safety measures continue to be in place; 15 min checks  and monitoring for safety and/or changes in condition.  R- Client took all medications well.   Problem: Activity: Goal: Interest or engagement in activities will improve Outcome: Not Progressing Goal: Sleeping patterns will improve Outcome: Progressing   Problem: Education: Goal: Knowledge of [place] [facility] Education information/materials will improve Outcome: Progressing Goal: Emotional status will improve Outcome: Progressing Goal: Mental status will improve Outcome: Progressing Goal: Verbalization of understanding the information provided will improve Outcome: Progressing

## 2019-12-10 NOTE — [facility] Group Notes (Signed)
[facility] Group Notes:  (Nursing/MHT/Case Management/Adjunct)  Date:  12/10/2019  Time:  8:30 AM  Type of Therapy:  Community Meeting  Participation Level:  Did Not Attend  Participation Quality:    Affect:    Cognitive:    Insight:    Engagement in Group:    Modes of Intervention:    Summary of Progress/Problems:  Eric Pineda 12/10/2019, 5:32 PM

## 2019-12-10 NOTE — Progress Notes (Signed)
Care of patient taken over at 2300, patient continued to rest in the bed quietly through out the night with no issues to report on shift at this time.

## 2019-12-11 MED ORDER — OLANZAPINE 5 MG PO TBDP
15.0000 mg | ORAL_TABLET | Freq: Every day | ORAL | Status: DC
  Administered 2019-12-11 – 2019-12-14 (×4): 15 mg via ORAL
  Filled 2019-12-11 (×4): qty 3

## 2019-12-11 NOTE — Progress Notes (Signed)
Patient just came back to the nurses station and stated "can I ask you a question, can you tell me when I'm going to be leaving? How long do people normally stay here"? This Clinical research associate stated to patient that he would need to speak with the doctor, and he stated, "I've been here for a month now, and I need to get back to work". This Clinical research associate informed patient to explain how he's feeling to the doctor tomorrow, and he stated "you can't put that in my file". This Clinical research associate stated to patient that this information would be noted in his chart, and he thanked this Clinical research associate and walked off.

## 2019-12-11 NOTE — Plan of Care (Deleted)
  Problem: Education: Goal: Knowledge of [facility] [facility] Education information/materials will improve 12/11/2019 1526 by Wardell Heath, RN Outcome: Not Progressing 12/11/2019 1204 by Wardell Heath, RN Outcome: Not Progressing Goal: Emotional status will improve 12/11/2019 1526 by Wardell Heath, RN Outcome: Not Progressing 12/11/2019 1204 by Wardell Heath, RN Outcome: Not Progressing Goal: Mental status will improve 12/11/2019 1526 by Wardell Heath, RN Outcome: Not Progressing 12/11/2019 1204 by Wardell Heath, RN Outcome: Not Progressing Goal: Verbalization of understanding the information provided will improve 12/11/2019 1526 by Wardell Heath, RN Outcome: Not Progressing 12/11/2019 1204 by Wardell Heath, RN Outcome: Not Progressing   Problem: Activity: Goal: Interest or engagement in activities will improve 12/11/2019 1526 by Wardell Heath, RN Outcome: Not Progressing 12/11/2019 1204 by Wardell Heath, RN Outcome: Not Progressing Goal: Sleeping patterns will improve 12/11/2019 1526 by Wardell Heath, RN Outcome: Not Progressing 12/11/2019 1204 by Wardell Heath, RN Outcome: Not Progressing   Problem: Coping: Goal: Ability to verbalize frustrations and anger appropriately will improve 12/11/2019 1526 by Wardell Heath, RN Outcome: Not Progressing 12/11/2019 1204 by Wardell Heath, RN Outcome: Not Progressing Goal: Ability to demonstrate self-control will improve 12/11/2019 1526 by Wardell Heath, RN Outcome: Not Progressing 12/11/2019 1204 by Wardell Heath, RN Outcome: Not Progressing   Problem: Health Behavior/Discharge Planning: Goal: Identification of resources available to assist in meeting health care needs will improve 12/11/2019 1526 by Wardell Heath, RN Outcome: Not Progressing 12/11/2019 1204 by Wardell Heath, RN Outcome: Not Progressing D: Patient Presents with appropriate mood and affect.  Patient was calm and cooperative on unit.  Patient was out in open  areas,walking the hall  and doing a repeated stutter step. Patient denies suicidal thoughts and self harming thoughts. Patient denies A/VH. A:  Support and encouragement provided Routine safety checks conducted every 15 minutes. Patient  Informed to notify staff with any concerns.  Safety maintained.  R:  No adverse drug reactions noted.  Patient contracts for safety.  Patient compliant treatment plan. Patient cooperative and calm. Patient interacts well with others on the unit.  Safety maintained.    Goal: Compliance with treatment plan for underlying cause of condition will improve 12/11/2019 1526 by Wardell Heath, RN Outcome: Not Progressing 12/11/2019 1204 by Wardell Heath, RN Outcome: Not Progressing   Problem: Physical Regulation: Goal: Ability to maintain clinical measurements within normal limits will improve 12/11/2019 1526 by Wardell Heath, RN Outcome: Not Progressing 12/11/2019 1204 by Wardell Heath, RN Outcome: Not Progressing   Problem: Safety: Goal: Periods of time without injury will increase 12/11/2019 1526 by Wardell Heath, RN Outcome: Not Progressing 12/11/2019 1204 by Wardell Heath, RN Outcome: Not Progressing

## 2019-12-11 NOTE — [facility] Group Notes (Signed)
LCSW Group Therapy Note   12/11/2019 12:55 PM   Type of Therapy and Topic:  Group Therapy:  Overcoming Obstacles   Participation Level:  Did Not Attend   Description of Group:    In this group patients will be encouraged to explore what they see as obstacles to their own wellness and recovery. They will be guided to discuss their thoughts, feelings, and behaviors related to these obstacles. The group will process together ways to cope with barriers, with attention given to specific choices patients can make. Each patient will be challenged to identify changes they are motivated to make in order to overcome their obstacles. This group will be process-oriented, with patients participating in exploration of their own experiences as well as giving and receiving support and challenge from other group members.   Therapeutic Goals: 1. Patient will identify personal and current obstacles as they relate to admission. 2. Patient will identify barriers that currently interfere with their wellness or overcoming obstacles.  3. Patient will identify feelings, thought process and behaviors related to these barriers. 4. Patient will identify two changes they are willing to make to overcome these obstacles:      Summary of Patient Progress  Before group started, pt came to the craft room and was given a mask. Pt refused to take the mask and was informed that in order to attend group you had to wear a mask. Pt pointed to his drink, took it and threw it in the trash can, shook his head no at the mask, and left the group room.     Therapeutic Modalities:   Cognitive Behavioral Therapy Solution Focused Therapy Motivational Interviewing Relapse Prevention Therapy  Iris Pert, MSW, LCSW Clinical Social Work 12/11/2019 12:55 PM

## 2019-12-11 NOTE — [facility] Group Notes (Signed)
[facility] Group Notes:  (Nursing/MHT/Case Management/Adjunct)  Date:  12/11/2019  Time:  9:09 PM  Type of Therapy:  Group Therapy  Participation Level:  Did Not Attend    Landry Mellow 12/11/2019, 9:09 PM

## 2019-12-11 NOTE — Progress Notes (Signed)
Recreation Therapy Notes  Date: 12/11/2019  Time: 9:30 am   Location: Craft room   Behavioral response: N/A   Intervention Topic: Self-esteem    Discussion/Intervention: Patient did not attend group.   Clinical Observations/Feedback:  Patient did not attend group.   Eric Pineda LRT/CTRS        [name] 12/11/2019 10:49 AM

## 2019-12-11 NOTE — Tx Team (Signed)
Interdisciplinary Treatment and Diagnostic Plan Update  12/11/2019 Time of Session: 9am Eric Pineda MRN: [id number]  Principal Diagnosis: Schizophreniform disorder [facility])  Secondary Diagnoses: Principal Problem:   Schizophreniform disorder ([place])   Current Medications:  Current Facility-Administered Medications  Medication Dose Route Frequency Provider Last Rate Last Admin  . acetaminophen (TYLENOL) tablet 650 mg  650 mg Oral Q6H PRN Dixon, Rashaun M, NP      . alum & mag hydroxide-simeth (MAALOX/MYLANTA) 200-200-20 MG/5ML suspension 30 mL  30 mL Oral Q4H PRN Dixon, Rashaun M, NP      . benztropine (COGENTIN) tablet 0.5 mg  0.5 mg Oral BID PRN Clapacs, Madie Reno, MD      . hydrOXYzine (ATARAX/VISTARIL) tablet 25 mg  25 mg Oral TID PRN Deloria Lair, NP      . magnesium hydroxide (MILK OF MAGNESIA) suspension 30 mL  30 mL Oral Daily PRN Dixon, Rashaun M, NP      . OLANZapine zydis (ZYPREXA) disintegrating tablet 10 mg  10 mg Oral QHS Clapacs, Madie Reno, MD   10 mg at 12/10/19 2112  . traZODone (DESYREL) tablet 50 mg  50 mg Oral QHS PRN Deloria Lair, NP       PTA Medications: No medications prior to admission.    Patient Stressors:    Patient Strengths:    Treatment Modalities: Medication Management, Group therapy, Case management,  1 to 1 session with clinician, Psychoeducation, Recreational therapy.   Physician Treatment Plan for Primary Diagnosis: Schizophreniform disorder [facility]) Long Term Goal(s): Improvement in symptoms so as ready for discharge Improvement in symptoms so as ready for discharge   Short Term Goals: Ability to demonstrate self-control will improve Ability to identify and develop effective coping behaviors will improve Ability to maintain clinical measurements within normal limits will improve Compliance with prescribed medications will improve  Medication Management: Evaluate patient's response, side effects, and tolerance of medication  regimen.  Therapeutic Interventions: 1 to 1 sessions, Unit Group sessions and Medication administration.  Evaluation of Outcomes: Not Progressing  Physician Treatment Plan for Secondary Diagnosis: Principal Problem:   Schizophreniform disorder ([place])  Long Term Goal(s): Improvement in symptoms so as ready for discharge Improvement in symptoms so as ready for discharge   Short Term Goals: Ability to demonstrate self-control will improve Ability to identify and develop effective coping behaviors will improve Ability to maintain clinical measurements within normal limits will improve Compliance with prescribed medications will improve     Medication Management: Evaluate patient's response, side effects, and tolerance of medication regimen.  Therapeutic Interventions: 1 to 1 sessions, Unit Group sessions and Medication administration.  Evaluation of Outcomes: Not Progressing   RN Treatment Plan for Primary Diagnosis: Schizophreniform disorder ([place]) Long Term Goal(s): Knowledge of disease and therapeutic regimen to maintain health will improve  Short Term Goals: Ability to verbalize feelings will improve, Ability to identify and develop effective coping behaviors will improve and Compliance with prescribed medications will improve  Medication Management: RN will administer medications as ordered by provider, will assess and evaluate patient's response and provide education to patient for prescribed medication. RN will report any adverse and/or side effects to prescribing provider.  Therapeutic Interventions: 1 on 1 counseling sessions, Psychoeducation, Medication administration, Evaluate responses to treatment, Monitor vital signs and CBGs as ordered, Perform/monitor CIWA, COWS, AIMS and Fall Risk screenings as ordered, Perform wound care treatments as ordered.  Evaluation of Outcomes: Not Progressing   LCSW Treatment Plan for Primary Diagnosis: Schizophreniform disorder ([place]) Long  Term Goal(s): Safe transition to appropriate next level of care at discharge, Engage patient in therapeutic group addressing interpersonal concerns.  Short Term Goals: Engage patient in aftercare planning with referrals and resources  Therapeutic Interventions: Assess for all discharge needs, 1 to 1 time with Social worker, Explore available resources and support systems, Assess for adequacy in community support network, Educate family and significant other(s) on suicide prevention, Complete Psychosocial Assessment, Interpersonal group therapy.  Evaluation of Outcomes: Not Progressing   Progress in Treatment: Attending groups: No. Participating in groups: No. Taking medication as prescribed: Yes. Toleration medication: Yes. Family/Significant other contact made: Yes, individual(s) contacted:  SPE completed with the patient's father.  Patient understands diagnosis: Unknown Discussing patient identified problems/goals with staff: No. Medical problems stabilized or resolved: Yes. Denies suicidal/homicidal ideation: Unknown Issues/concerns per patient self-inventory: No. Other: NA  New problem(s) identified: No, Describe:  None reported  New Short Term/Long Term Goal(s): Update 12/11/2019: elimination of symptoms of psychosis, medication management for mood stabilization; elimination of SI thoughts; development of comprehensive mental wellness/sobriety plan.  Patient Goals:  Pt given opportunity to attend meeting but declined. No goal provided at this time.  Update 12/11/2019:  No changes at this time.  Discharge Plan or Barriers: Update 12/11/2019:  Patient declines aftercare at this time.    Reason for Continuation of Hospitalization: Medication stabilization  Estimated Length of Stay: 1-7 days  Recreational Therapy: Patient Stressors: N/A Patient Goal: Patient will engage in groups without prompting or encouragement from LRT x3 group sessions within 5 recreation therapy group  sessions  Attendees: Patient:  12/11/2019 11:27 AM  Physician: Mordecai Rasmussen 12/11/2019 11:27 AM  Nursing: Cecille Amsterdam, RN 12/11/2019 11:27 AM  RN Care Manager: 12/11/2019 11:27 AM  Social Worker: Penni Homans, LCSW 12/11/2019 11:27 AM  Recreational Therapist:  12/11/2019 11:27 AM  Other:  12/11/2019 11:27 AM  Other:  12/11/2019 11:27 AM  Other: 12/11/2019 11:27 AM    Scribe for Treatment Team: Harden Mo, LCSW 12/11/2019 11:27 AM

## 2019-12-11 NOTE — Plan of Care (Signed)
D: Patient presents with appropriate mood and affect.  Patient was calm and cooperative . Patient was out in open areas walking and doing a 2 step repetitive pattern Patient denies suicidal thoughts and self harming thoughts. Patient denies A/VH  A:   Support and encouragement provided Routine safety checks conducted every 15 minutes. Patient  Informed to notify staff with any concerns.  Safety maintained.  R:  Patient contracts for safety.  Patient compliant with treatment plan. Patient cooperative and calm. Safety maintained.    Problem: Education: Goal: Knowledge of  General Education information/materials will improve Outcome: Not Progressing Goal: Emotional status will improve Outcome: Not Progressing Goal: Mental status will improve Outcome: Not Progressing Goal: Verbalization of understanding the information provided will improve Outcome: Not Progressing   Problem: Activity: Goal: Interest or engagement in activities will improve Outcome: Not Progressing Goal: Sleeping patterns will improve Outcome: Not Progressing   Problem: Coping: Goal: Ability to verbalize frustrations and anger appropriately will improve Outcome: Not Progressing Goal: Ability to demonstrate self-control will improve Outcome: Not Progressing   Problem: Health Behavior/Discharge Planning: Goal: Identification of resources available to assist in meeting health care needs will improve Outcome: Not Progressing Goal: Compliance with treatment plan for underlying cause of condition will improve Outcome: Not Progressing   Problem: Physical Regulation: Goal: Ability to maintain clinical measurements within normal limits will improve Outcome: Not Progressing   Problem: Safety: Goal: Periods of time without injury will increase Outcome: Not Progressing

## 2019-12-11 NOTE — Progress Notes (Signed)
Patient just came up to the nurses station and stated to this writer "so I had some belongings that they put up, and in those belongings is three bucks, could I get that out and pay y'all for some of that chocolate". Patient's comment is regarding a box of chocolate bars that he saw sitting in the nurses station.

## 2019-12-11 NOTE — Progress Notes (Signed)
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  Patient is starting to be more appropriate.  Attends some groups.  Socializes slightly.  Still unable to hold a conversation with any kind of self generated thought.  Still flat and withdrawn.  No sign however of any side effects from medication.  Physically doing well vitals normal Principal Problem: Schizophreniform disorder ([place]) Diagnosis: Principal Problem:   Schizophreniform disorder ([place])  Total Time spent with patient: 30 minutes  Past Psychiatric History: Past history unavailable.  Apparently only substance abuse  Past Medical History:  Past Medical History:  Diagnosis Date  . Bizarre behavior 12/02/2019   History reviewed. No pertinent surgical history. Family History: History reviewed. No pertinent family history. Family Psychiatric  History: See previous Social History:  Social History   Substance and Sexual Activity  Alcohol Use None     Social History   Substance and Sexual Activity  Drug Use Not on file    Social History   Socioeconomic History  . Marital status: Single    Spouse name: Not on file  . Number of children: Not on file  . Years of education: Not on file  . Highest education level: Not on file  Occupational History  . Not on file  Tobacco Use  . Smoking status: Never Smoker  . Smokeless tobacco: Never Used  Substance and Sexual Activity  . Alcohol use: Not on file  . Drug use: Not on file  . Sexual activity: Not on file  Other Topics Concern  . Not on file  Social History Narrative  . Not on file   Social Determinants of Health   Financial Resource Strain:   . Difficulty of Paying Living Expenses:   Food Insecurity:   . Worried About Charity fundraiser in the Last Year:   . Arboriculturist in the Last Year:   Transportation Needs:   . Film/video editor (Medical):   Marland Kitchen Lack of  Transportation (Non-Medical):   Physical Activity:   . Days of Exercise per Week:   . Minutes of Exercise per Session:   Stress:   . Feeling of Stress :   Social Connections:   . Frequency of Communication with Friends and Family:   . Frequency of Social Gatherings with Friends and Family:   . Attends Religious Services:   . Active Member of Clubs or Organizations:   . Attends Archivist Meetings:   Marland Kitchen Marital Status:    Additional Social History:                         Sleep: Fair  Appetite:  Fair  Current Medications: Current Facility-Administered Medications  Medication Dose Route Frequency Provider Last Rate Last Admin  . acetaminophen (TYLENOL) tablet 650 mg  650 mg Oral Q6H PRN Dixon, Rashaun M, NP      . alum & mag hydroxide-simeth (MAALOX/MYLANTA) 200-200-20 MG/5ML suspension 30 mL  30 mL Oral Q4H PRN Dixon, Rashaun M, NP      . benztropine (COGENTIN) tablet 0.5 mg  0.5 mg Oral BID PRN Dhyana Bastone, Madie Reno, MD      . hydrOXYzine (ATARAX/VISTARIL) tablet 25 mg  25 mg Oral TID PRN Deloria Lair, NP      . magnesium hydroxide (MILK OF MAGNESIA) suspension 30 mL  30 mL Oral Daily PRN Deloria Lair, NP      .  OLANZapine zydis (ZYPREXA) disintegrating tablet 15 mg  15 mg Oral QHS Katara Griner T, MD      . traZODone (DESYREL) tablet 50 mg  50 mg Oral QHS PRN Jearld Lesch, NP        Lab Results: No results found for this or any previous visit (from the past 48 hour(s)).  Blood Alcohol level:  Lab Results  Component Value Date   ETH <10 12/01/2019    Metabolic Disorder Labs: Lab Results  Component Value Date   HGBA1C 5.1 12/03/2019   MPG 99.67 12/03/2019   No results found for: PROLACTIN No results found for: CHOL, TRIG, HDL, CHOLHDL, VLDL, LDLCALC  Physical Findings: AIMS:  , ,  ,  ,    CIWA:    COWS:     Musculoskeletal: Strength & Muscle Tone: within normal limits Gait & Station: normal Patient leans: N/A  Psychiatric Specialty  Exam: Physical Exam  Nursing note and vitals reviewed. Constitutional: He appears well-developed and well-nourished.  HENT:  Head: Normocephalic and atraumatic.  Eyes: Pupils are equal, round, and reactive to light. Conjunctivae are normal.  Cardiovascular: Regular rhythm and normal heart sounds.  Respiratory: Effort normal. No respiratory distress.  GI: Soft.  Musculoskeletal:        General: Normal range of motion.     Cervical back: Normal range of motion.  Neurological: He is alert.  Skin: Skin is warm and dry.  Psychiatric: His affect is blunt. He is noncommunicative.    Review of Systems  Unable to perform ROS: Psychiatric disorder    Blood pressure 126/83, pulse 78, temperature 97.6 F (36.4 C), temperature source Oral, resp. rate 17, height 5\' 8"  (1.727 m), weight 71.7 kg, SpO2 99 %.Body mass index is 24.02 kg/m.  General Appearance: Casual  Eye Contact:  Good  Speech:  Slow  Volume:  Decreased  Mood:  Euthymic  Affect:  Inappropriate  Thought Process:  Disorganized  Orientation:  Negative  Thought Content:  Negative  Suicidal Thoughts:  No  Homicidal Thoughts:  No  Memory:  Negative  Judgement:  Negative  Insight:  Negative  Psychomotor Activity:  Decreased  Concentration:  Concentration: Poor  Recall:  Poor  Fund of Knowledge:  Poor  Language:  Negative  Akathisia:  Negative  Handed:  Right  AIMS (if indicated):     Assets:  Housing Physical Health  ADL's:  Impaired  Cognition:  Impaired,  Mild  Sleep:  Number of Hours: 8     Treatment Plan Summary: Daily contact with patient to assess and evaluate symptoms and progress in treatment, Medication management and Plan Increase olanzapine to 15 mg at night.  Possibly because of compliance possibly just effectiveness or time he looks like he is starting to get better with this medicine.  , MD 12/11/2019, 4:22 PM

## 2019-12-11 NOTE — BHH Counselor (Signed)
CSW spoke with the patient on aftercare planning. Pt declined hospital discharge appointments at this time.  CSW will provide with list of local resources at discharge.  Penni Homans, MSW, LCSW 12/11/2019 1:31 PM

## 2019-12-12 NOTE — Plan of Care (Addendum)
D: Pt is alert and oriented to person, somewhat to place and time, but not to situation, circumstances. Pt has poor insight; when asked where pt was, pt said, "Im in report." When asked what this building pt is in, pt states, "Its [place]." When asked why pt was here in the hospital pt states, "Im here for report." Pt is calm, cooperative, visible, attends groups, affect bright at times, eye contact good. Pt is noted to make bizzare postures and steps and repeatedly enters and exits doors before completely exiting, in a ritualistic patterns. Pt denies suicidal and homicidal ideation, denies hallucinations, denies feelings of depression and anxiety. Pt has a good appetite, reports he sleeps well. No distress noted or reported, pt voices no complaints. A: Provided reality orientation, emotional support, medications when scheduled or needed.  P: Will continue to monitor pt per Q15 minute face checks and monitor pt for safety and progress.   Problem: Education: Goal: Knowledge of [place] [facility] Education information/materials will improve Outcome: Progressing Goal: Mental status will improve Outcome: Progressing Goal: Verbalization of understanding the information provided will improve Outcome: Progressing   Problem: Activity: Goal: Interest or engagement in activities will improve Outcome: Progressing Goal: Sleeping patterns will improve Outcome: Progressing   Problem: Coping: Goal: Ability to verbalize frustrations and anger appropriately will improve Outcome: Progressing Goal: Ability to demonstrate self-control will improve Outcome: Progressing   Problem: Health Behavior/Discharge Planning: Goal: Identification of resources available to assist in meeting health care needs will improve Outcome: Progressing Goal: Compliance with treatment plan for underlying cause of condition will improve Outcome: Progressing   Problem: Physical Regulation: Goal: Ability to maintain clinical  measurements within normal limits will improve Outcome: Progressing   Problem: Safety: Goal: Periods of time without injury will increase Outcome: Progressing

## 2019-12-12 NOTE — B[facility] Group Notes (Signed)
LCSW Group Therapy Note  12/12/2019 1:00 PM  Type of Therapy/Topic:  Group Therapy:  Feelings about Diagnosis  Participation Level:  Did Not Attend   Description of Group:   This group will allow patients to explore their thoughts and feelings about diagnoses they have received. Patients will be guided to explore their level of understanding and acceptance of these diagnoses. Facilitator will encourage patients to process their thoughts and feelings about the reactions of others to their diagnosis and will guide patients in identifying ways to discuss their diagnosis with significant others in their lives. This group will be process-oriented, with patients participating in exploration of their own experiences, giving and receiving support, and processing challenge from other group members.   Therapeutic Goals: 1. Patient will demonstrate understanding of diagnosis as evidenced by identifying two or more symptoms of the disorder 2. Patient will be able to express two feelings regarding the diagnosis 3. Patient will demonstrate their ability to communicate their needs through discussion and/or role play  Summary of Patient Progress: X  Therapeutic Modalities:   Cognitive Behavioral Therapy Brief Therapy Feelings Identification   Leomia Blake, MSW, LCSW 12/12/2019 10:17 AM   

## 2019-12-12 NOTE — B[facility] Group Notes (Signed)
[facility] Group Notes:  (Nursing/MHT/Case Management/Adjunct)  Date:  12/12/2019  Time:  8:44 AM  Type of Therapy:  Community Meeting  Participation Level:  Minimal  Participation Quality:  Inattentive  Affect:  Flat  Cognitive:  Lacking  Insight:  Lacking and Limited  Engagement in Group:  Limited  Modes of Intervention:  Discussion and Education  Summary of Progress/Problems:  Eric Pineda 12/12/2019, 8:44 AM

## 2019-12-12 NOTE — Progress Notes (Signed)
[facility] MD Progress Note  12/12/2019 9:41 AM Eric Pineda  MRN:  [id number] Subjective: Follow-up for this patient with what appears to be new onset psychosis.  Patient seen chart reviewed.  Case reviewed with treatment team.  Patient seems to be showing some slight improvement in terms of being able to interact with others somewhat appropriately and have conversations in which he speaks a little more clearly.  When given the opportunity to speak in an open-ended fashion however he still reverts to stereotyped phrases.  His affect is still odd and constricted.  He is still unable to articulate a clear plan for post discharge.  He is taking his medicine as near as can be tell and has no complaints about it Principal Problem: Schizophreniform disorder ([facility]) Diagnosis: Principal Problem:   Schizophreniform disorder ([facility])  Total Time spent with patient: 30 minutes  Past Psychiatric History: Past history of substance abuse details unknown  Past Medical History:  Past Medical History:  Diagnosis Date  . Bizarre behavior 12/02/2019   History reviewed. No pertinent surgical history. Family History: History reviewed. No pertinent family history. Family Psychiatric  History: See previous Social History:  Social History   Substance and Sexual Activity  Alcohol Use None     Social History   Substance and Sexual Activity  Drug Use Not on file    Social History   Socioeconomic History  . Marital status: Single    Spouse name: Not on file  . Number of children: Not on file  . Years of education: Not on file  . Highest education level: Not on file  Occupational History  . Not on file  Tobacco Use  . Smoking status: Never Smoker  . Smokeless tobacco: Never Used  Substance and Sexual Activity  . Alcohol use: Not on file  . Drug use: Not on file  . Sexual activity: Not on file  Other Topics Concern  . Not on file  Social History Narrative  . Not on file   Social Determinants of Health    Financial Resource Strain:   . Difficulty of Paying Living Expenses:   Food Insecurity:   . Worried About Programme researcher, broadcasting/film/video in the Last Year:   . Barista in the Last Year:   Transportation Needs:   . Freight forwarder (Medical):   Marland Kitchen Lack of Transportation (Non-Medical):   Physical Activity:   . Days of Exercise per Week:   . Minutes of Exercise per Session:   Stress:   . Feeling of Stress :   Social Connections:   . Frequency of Communication with Friends and Family:   . Frequency of Social Gatherings with Friends and Family:   . Attends Religious Services:   . Active Member of Clubs or Organizations:   . Attends Banker Meetings:   Marland Kitchen Marital Status:    Additional Social History:                         Sleep: Fair  Appetite:  Fair  Current Medications: Current Facility-Administered Medications  Medication Dose Route Frequency Provider Last Rate Last Admin  . acetaminophen (TYLENOL) tablet 650 mg  650 mg Oral Q6H PRN Dixon, Rashaun M, NP      . alum & mag hydroxide-simeth (MAALOX/MYLANTA) 200-200-20 MG/5ML suspension 30 mL  30 mL Oral Q4H PRN Dixon, Rashaun M, NP      . benztropine (COGENTIN) tablet 0.5 mg  0.5 mg  Oral BID PRN Arsema Tusing, Jackquline Denmark, MD      . hydrOXYzine (ATARAX/VISTARIL) tablet 25 mg  25 mg Oral TID PRN Jearld Lesch, NP   25 mg at 12/11/19 2143  . magnesium hydroxide (MILK OF MAGNESIA) suspension 30 mL  30 mL Oral Daily PRN Dixon, Rashaun M, NP      . OLANZapine zydis (ZYPREXA) disintegrating tablet 15 mg  15 mg Oral QHS Kindrick Lankford, Jackquline Denmark, MD   15 mg at 12/11/19 2143  . traZODone (DESYREL) tablet 50 mg  50 mg Oral QHS PRN Jearld Lesch, NP        Lab Results: No results found for this or any previous visit (from the past 48 hour(s)).  Blood Alcohol level:  Lab Results  Component Value Date   ETH <10 12/01/2019    Metabolic Disorder Labs: Lab Results  Component Value Date   HGBA1C 5.1 12/03/2019   MPG 99.67  12/03/2019   No results found for: PROLACTIN No results found for: CHOL, TRIG, HDL, CHOLHDL, VLDL, LDLCALC  Physical Findings: AIMS:  , ,  ,  ,    CIWA:    COWS:     Musculoskeletal: Strength & Muscle Tone: within normal limits Gait & Station: normal Patient leans: N/A  Psychiatric Specialty Exam: Physical Exam  Nursing note and vitals reviewed. Constitutional: He appears well-developed and well-nourished.  HENT:  Head: Normocephalic and atraumatic.  Eyes: Pupils are equal, round, and reactive to light. Conjunctivae are normal.  Cardiovascular: Regular rhythm and normal heart sounds.  Respiratory: Effort normal. No respiratory distress.  GI: Soft.  Musculoskeletal:        General: Normal range of motion.     Cervical back: Normal range of motion.  Neurological: He is alert.  Skin: Skin is warm and dry.  Psychiatric: His affect is blunt. His speech is delayed. He is slowed. Cognition and memory are impaired. He expresses inappropriate judgment. He expresses no homicidal and no suicidal ideation. He is noncommunicative.    Review of Systems  Constitutional: Negative.   HENT: Negative.   Eyes: Negative.   Respiratory: Negative.   Cardiovascular: Negative.   Gastrointestinal: Negative.   Musculoskeletal: Negative.   Skin: Negative.   Neurological: Negative.   Psychiatric/Behavioral: Negative.     Blood pressure 126/83, pulse 78, temperature 97.6 F (36.4 C), temperature source Oral, resp. rate 17, height 5\' 8"  (1.727 m), weight 71.7 kg, SpO2 99 %.Body mass index is 24.02 kg/m.  General Appearance: Casual  Eye Contact:  Good  Speech:  Slow  Volume:  Decreased  Mood:  Euthymic  Affect:  Constricted  Thought Process:  Disorganized  Orientation:  Full (Time, Place, and Person)  Thought Content:  Tangential  Suicidal Thoughts:  No  Homicidal Thoughts:  No  Memory:  Immediate;   Fair Recent;   Poor Remote;   Poor  Judgement:  Impaired  Insight:  Shallow   Psychomotor Activity:  Normal  Concentration:  Concentration: Fair  Recall:  of Knowledge:  Fair  Language:  Poor  Akathisia:  No  Handed:  Right  AIMS (if indicated):     Assets:  Physical Health  ADL's:  Impaired  Cognition:  Impaired,  Mild  Sleep:  Number of Hours: 6     Treatment Plan Summary: Daily contact with patient to assess and evaluate symptoms and progress in treatment, Medication management and Plan Patient now on 15 mg olanzapine.  No change to medication today.  Spent  some time doing psychoeducation and encouragement with him.  I outlined with him the usefulness of attending groups and interacting with others.  Tried to encourage him to talk about his outpatient life.  Patient nodded in agreement but added nothing to elaborate on any of this.  Alethia Berthold, MD 12/12/2019, 9:41 AM

## 2019-12-12 NOTE — Progress Notes (Signed)
Patient alert and oriented x 2 with periods of confusion to time and situation no distress noted, his thoughts are disorganized and incoherent at tomes, he was noted making bizarre posturing andn during conversation he was noted to have delayed response and forwards very little. Information, no distress noted, complaint with medication regimen, 15 minutes safety checks maintained will continue to monitor.

## 2019-12-12 NOTE — Progress Notes (Signed)
Recreation Therapy Notes  Date: 12/12/2019  Time: 9:30 am   Location: Craft room   Behavioral response: N/A   Intervention Topic: Problem-Solving     Discussion/Intervention: Patient did not attend group.   Clinical Observations/Feedback:  Patient did not attend group.   Ricka Westra LRT/CTRS         Deauna Yaw 12/12/2019 11:17 AM

## 2019-12-13 MED ORDER — FLUOXETINE HCL 20 MG PO CAPS
20.0000 mg | ORAL_CAPSULE | Freq: Every day | ORAL | Status: DC
  Administered 2019-12-13 – 2019-12-19 (×7): 20 mg via ORAL
  Filled 2019-12-13 (×7): qty 1

## 2019-12-13 NOTE — B[facility] Group Notes (Signed)
Emotional Regulation 12/13/2019 1PM  Type of Therapy/Topic:  Group Therapy:  Emotion Regulation  Participation Level:  Did Not Attend   Description of Group:   The purpose of this group is to assist patients in learning to regulate negative emotions and experience positive emotions. Patients will be guided to discuss ways in which they have been vulnerable to their negative emotions. These vulnerabilities will be juxtaposed with experiences of positive emotions or situations, and patients will be challenged to use positive emotions to combat negative ones. Special emphasis will be placed on coping with negative emotions in conflict situations, and patients will process healthy conflict resolution skills.  Therapeutic Goals: 1. Patient will identify two positive emotions or experiences to reflect on in order to balance out negative emotions 2. Patient will label two or more emotions that they find the most difficult to experience 3. Patient will demonstrate positive conflict resolution skills through discussion and/or role plays  Summary of Patient Progress:       Therapeutic Modalities:   Cognitive Behavioral Therapy Feelings Identification Dialectical Behavioral Therapy   Eric Pineda T Leandro Berkowitz, LCSW 12/13/2019 1:39 PM  

## 2019-12-13 NOTE — Plan of Care (Signed)
Pt was denies depression, anxiety, SI, HI and AVH. Pt was educated on care plan and verbalizes understanding. Torrie Mayers RN Problem: Education: Goal: Knowledge of [place] [facility] Education information/materials will improve Outcome: Progressing Goal: Emotional status will improve Outcome: Progressing Goal: Mental status will improve Outcome: Progressing Goal: Verbalization of understanding the information provided will improve Outcome: Progressing   Problem: Activity: Goal: Interest or engagement in activities will improve Outcome: Progressing Goal: Sleeping patterns will improve Outcome: Progressing   Problem: Coping: Goal: Ability to verbalize frustrations and anger appropriately will improve Outcome: Progressing Goal: Ability to demonstrate self-control will improve Outcome: Progressing   Problem: Health Behavior/Discharge Planning: Goal: Identification of resources available to assist in meeting health care needs will improve Outcome: Progressing Goal: Compliance with treatment plan for underlying cause of condition will improve Outcome: Progressing   Problem: Physical Regulation: Goal: Ability to maintain clinical measurements within normal limits will improve Outcome: Progressing   Problem: Safety: Goal: Periods of time without injury will increase Outcome: Progressing

## 2019-12-13 NOTE — BHH Counselor (Signed)
CSW contacted the patient's father to return the phone call from 12/13/2019.  Family requested that CSW let them know if patient was taking his medication and if he was doing well.   CSW reviewed chart and informed that per medication records patient appears to be taking his medications and no nurse has made comment of any inappropriate behaviors on the unit.  CSW noted that in progression the psychiatrist stated that patient appears to be improving.    Family requested that CSW call when pt is being discharged and CSW informed that pt will have to provide permission, however, if he is aligned then CSW or pt will contact family at that time.   Call was completed using [employer], [place] [id number].  Penni Homans, MSW, LCSW 12/13/2019 2:18 PM

## 2019-12-13 NOTE — Progress Notes (Signed)
Recreation Therapy Notes  Date: 12/13/2019   Time: 9:30 am   Location: Craft room   Behavioral response: N/A   Intervention Topic: Time-Management        Discussion/Intervention: Patient did not attend group.   Clinical Observations/Feedback:  Patient did not attend group.   Lennis Korb LRT/CTRS        Eric Pineda 12/13/2019 11:11 AM 

## 2019-12-13 NOTE — Progress Notes (Deleted)
Pr denies SI, HI and AVH. Pt received dc packet, prescriptions and belongings. Pt was educated on dc plan and verbalized understanding. Torrie Mayers RN

## 2019-12-13 NOTE — Plan of Care (Signed)
  Problem: Education: Goal: Mental status will improve Outcome: Progressing Goal: Verbalization of understanding the information provided will improve Outcome: Progressing   Problem: Activity: Goal: Interest or engagement in activities will improve Outcome: Progressing Goal: Sleeping patterns will improve Outcome: Progressing   Problem: Coping: Goal: Ability to verbalize frustrations and anger appropriately will improve Outcome: Progressing Goal: Ability to demonstrate self-control will improve Outcome: Progressing   Problem: Health Behavior/Discharge Planning: Goal: Compliance with treatment plan for underlying cause of condition will improve Outcome: Progressing

## 2019-12-13 NOTE — Progress Notes (Signed)
Pt has shown much improvement today. He has been assertive and answering questions with more than one word answers. He has been calm, cooperative and pleasant. He has been in the day room around others and not withdrawn in his room. When I asked him how his day was going and how he was he responded that he was fine with no issues. He still has a flat affect.  Torrie Mayers RN

## 2019-12-13 NOTE — Progress Notes (Signed)
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 Patient seems to present just as he did yesterday. A little more improved speech and speaking people as a walker to the unit saying good morning. However when asked open-ended questions he of simple responses such as "the usual." Patient has continued to give answers for straightforward questions such as yes, no, or it's okay.  Principal Problem: Schizophreniform disorder ([place]) Diagnosis: Principal Problem:   Schizophreniform disorder ([place])  Total Time spent with patient: 30 minutes  Past Psychiatric History: No records available except a mention that he had substance abuse treatment in January.  He had an episode of drinking rubbing alcohol between then and now but it is not clear how that was treated.  No known past psychiatric treatment or medication.  Past Medical History:  Past Medical History:  Diagnosis Date  . Bizarre behavior 12/02/2019   History reviewed. No pertinent surgical history. Family History: History reviewed. No pertinent family history. Family Psychiatric  History: None reported Social History:  Social History   Substance and Sexual Activity  Alcohol Use None     Social History   Substance and Sexual Activity  Drug Use Not on file    Social History   Socioeconomic History  . Marital status: Single    Spouse name: Not on file  . Number of children: Not on file  . Years of education: Not on file  . Highest education level: Not on file  Occupational History  . Not on file  Tobacco Use  . Smoking status: Never Smoker  . Smokeless tobacco: Never Used  Substance and Sexual Activity  . Alcohol use: Not on file  . Drug use: Not on file  . Sexual activity: Not on file  Other Topics Concern  . Not on file  Social History Narrative  . Not on file   Social Determinants of Health   Financial  Resource Strain:   . Difficulty of Paying Living Expenses:   Food Insecurity:   . Worried About Charity fundraiser in the Last Year:   . Arboriculturist in the Last Year:   Transportation Needs:   . Film/video editor (Medical):   Marland Kitchen Lack of Transportation (Non-Medical):   Physical Activity:   . Days of Exercise per Week:   . Minutes of Exercise per Session:   Stress:   . Feeling of Stress :   Social Connections:   . Frequency of Communication with Friends and Family:   . Frequency of Social Gatherings with Friends and Family:   . Attends Religious Services:   . Active Member of Clubs or Organizations:   . Attends Archivist Meetings:   Marland Kitchen Marital Status:    Additional Social History:                         Sleep: Fair  Appetite:  Fair  Current Medications: Current Facility-Administered Medications  Medication Dose Route Frequency Provider Last Rate Last Admin  . acetaminophen (TYLENOL) tablet 650 mg  650 mg Oral Q6H PRN Dixon, Rashaun M, NP      . alum & mag hydroxide-simeth (MAALOX/MYLANTA) 200-200-20 MG/5ML suspension 30 mL  30 mL Oral Q4H PRN Dixon, Rashaun M, NP      . benztropine (COGENTIN) tablet 0.5 mg  0.5 mg Oral BID PRN Clapacs, Madie Reno, MD      .  FLUoxetine (PROZAC) capsule 20 mg  20 mg Oral Daily [place] Nonaka, Gerlene Burdock, FNP      . hydrOXYzine (ATARAX/VISTARIL) tablet 25 mg  25 mg Oral TID PRN Jearld Lesch, NP   25 mg at 12/11/19 2143  . magnesium hydroxide (MILK OF MAGNESIA) suspension 30 mL  30 mL Oral Daily PRN Dixon, Rashaun M, NP      . OLANZapine zydis (ZYPREXA) disintegrating tablet 15 mg  15 mg Oral QHS Clapacs, Jackquline Denmark, MD   15 mg at 12/12/19 2133  . traZODone (DESYREL) tablet 50 mg  50 mg Oral QHS PRN Jearld Lesch, NP   50 mg at 12/12/19 2133    Lab Results: No results found for this or any previous visit (from the past 48 hour(s)).  Blood Alcohol level:  Lab Results  Component Value Date   ETH <10 12/01/2019    Metabolic  Disorder Labs: Lab Results  Component Value Date   HGBA1C 5.1 12/03/2019   MPG 99.67 12/03/2019   No results found for: PROLACTIN No results found for: CHOL, TRIG, HDL, CHOLHDL, VLDL, LDLCALC  Physical Findings: AIMS:  , ,  ,  ,    CIWA:    COWS:     Musculoskeletal: Strength & Muscle Tone: within normal limits Gait & Station: normal Patient leans: N/A  Psychiatric Specialty Exam: Physical Exam  Nursing note and vitals reviewed. Constitutional: He is oriented to person, place, and time. He appears well-developed and well-nourished.  Cardiovascular: Normal rate.  Respiratory: Effort normal.  Musculoskeletal:        General: Normal range of motion.  Neurological: He is alert and oriented to person, place, and time.  Skin: Skin is warm.  Psychiatric: His affect is blunt. His speech is delayed. He is slowed. Cognition and memory are impaired. He expresses inappropriate judgment. He is noncommunicative.    Review of Systems  Constitutional: Negative.   HENT: Negative.   Eyes: Negative.   Respiratory: Negative.   Cardiovascular: Negative.   Gastrointestinal: Negative.   Genitourinary: Negative.   Musculoskeletal: Negative.   Skin: Negative.   Neurological: Negative.   Psychiatric/Behavioral: Negative.     Blood pressure (!) 142/94, pulse 73, temperature 97.7 F (36.5 C), temperature source Oral, resp. rate 18, height 5\' 8"  (1.727 m), weight 71.7 kg, SpO2 99 %.Body mass index is 24.02 kg/m.  General Appearance: Casual  Eye Contact:  Minimal  Speech:  Slow  Volume:  Decreased  Mood:  Euthymic  Affect:  Blunt  Thought Process:  Disorganized  Orientation:  Full (Time, Place, and Person)  Thought Content:  Tangential  Suicidal Thoughts:  No  Homicidal Thoughts:  No  Memory:  Immediate;   Fair Recent;   Poor Remote;   Poor  Judgement:  Impaired  Insight:  Shallow  Psychomotor Activity:  Normal  Concentration:  Concentration: Fair  Recall:  of  Knowledge:  Fair  Language:  Poor  Akathisia:  No  Handed:  Right  AIMS (if indicated):     Assets:  Physical Health  ADL's:  Impaired  Cognition:  Impaired,  Mild  Sleep:  Number of Hours: 6   Assessment: Patient presents pacing the hallways on the milieu. Patient has continued with simple answers and not having drawn out conversations. Patient's eye contact is poor and continues walking away. Patient is continued showing compulsive mannerisms walking down the hallways with his steps and stepping over large lines in the floor. Patient does seem to show  some slight improvement with his speech and conversation but very minimal and discussed with Dr. Toni Amend to start some Prozac today to assist with some of his mannerisms.  Treatment Plan Summary: Daily contact with patient to assess and evaluate symptoms and progress in treatment and Medication management Continue Cogentin 0.5 mg p.o. twice daily as needed for tremors Start Prozac 20 mg p.o. daily for compulsions Continue Vistaril 25 mg p.o. 3 times daily as needed for anxiety Continue Zyprexa 15 mg p.o. nightly for schizophreniform Continue trazodone 50 mg p.o. nightly as needed for insomnia Encourage group therapy participation Continue every 15 minute safety checks  Maryfrances Bunnell, FNP 12/13/2019, 11:24 AM

## 2019-12-13 NOTE — Progress Notes (Signed)
Patient alert and oriented x 4. Patient pleasant and more engaged than he has been in past days. Observed interacting with peers and conversation with other staff. Scheduled medications administered and tolerated without incident.  Patient safe at this time with 15 minute safety checks conducted.  Patient informed to notify staff with any problems or concerns.

## 2019-12-14 NOTE — [facility] Group Notes (Signed)
LCSW Group Therapy Note  12/14/2019 1:00 PM  Type of Therapy/Topic:  Group Therapy:  Balance in Life  Participation Level:  Did Not Attend  Description of Group:    This group will address the concept of balance and how it feels and looks when one is unbalanced. Patients will be encouraged to process areas in their lives that are out of balance and identify reasons for remaining unbalanced. Facilitators will guide patients in utilizing problem-solving interventions to address and correct the stressor making their life unbalanced. Understanding and applying boundaries will be explored and addressed for obtaining and maintaining a balanced life. Patients will be encouraged to explore ways to assertively make their unbalanced needs known to significant others in their lives, using other group members and facilitator for support and feedback.  Therapeutic Goals: 1. Patient will identify two or more emotions or situations they have that consume much of in their lives. 2. Patient will identify signs/triggers that life has become out of balance:  3. Patient will identify two ways to set boundaries in order to achieve balance in their lives:  4. Patient will demonstrate ability to communicate their needs through discussion and/or role plays  Summary of Patient Progress: X  Therapeutic Modalities:   Cognitive Behavioral Therapy Solution-Focused Therapy Assertiveness Training  Naomie Crow MSW, LCSW 12/14/2019 12:49 PM     

## 2019-12-14 NOTE — Progress Notes (Signed)
Patient presents more orient this evening. Calm and cooperative with care. Continues to stop at brown lines and perform ritualistic behaviors. Denies any SI, HI, AVH. Medication compliant.Marland KitchenMarland KitchenAppropriate with staff and peers. Visible in milieu. Medication compliant. Rested eyes closed all night in no distress.  Medications given as prescribed. Safety checks maintained. Pt remains safe on unit with q 15 min checks.

## 2019-12-14 NOTE — Plan of Care (Signed)
  Problem: Education: Goal: Knowledge of [facility] Education information/materials will improve Outcome: Progressing Goal: Emotional status will improve Outcome: Progressing Goal: Mental status will improve Outcome: Progressing Goal: Verbalization of understanding the information provided will improve Outcome: Progressing   Problem: Activity: Goal: Interest or engagement in activities will improve Outcome: Progressing Goal: Sleeping patterns will improve Outcome: Progressing   Problem: Coping: Goal: Ability to verbalize frustrations and anger appropriately will improve Outcome: Progressing Goal: Ability to demonstrate self-control will improve Outcome: Progressing   Problem: Health Behavior/Discharge Planning: Goal: Identification of resources available to assist in meeting health care needs will improve Outcome: Progressing Goal: Compliance with treatment plan for underlying cause of condition will improve Outcome: Progressing   

## 2019-12-14 NOTE — Plan of Care (Signed)
Pt denies depression, anxiety, SI, HI and AVH. Pt was educated on care plan and verbalizes understanding. Pt is calm, cooperative and med compliant. Torrie Mayers RN Problem: Education: Goal: Knowledge of [facility] Education information/materials will improve Outcome: Progressing Goal: Emotional status will improve Outcome: Progressing Goal: Mental status will improve Outcome: Progressing Goal: Verbalization of understanding the information provided will improve Outcome: Progressing   Problem: Activity: Goal: Interest or engagement in activities will improve Outcome: Progressing Goal: Sleeping patterns will improve Outcome: Progressing   Problem: Coping: Goal: Ability to verbalize frustrations and anger appropriately will improve Outcome: Progressing Goal: Ability to demonstrate self-control will improve Outcome: Progressing   Problem: Health Behavior/Discharge Planning: Goal: Identification of resources available to assist in meeting health care needs will improve Outcome: Progressing Goal: Compliance with treatment plan for underlying cause of condition will improve Outcome: Progressing   Problem: Physical Regulation: Goal: Ability to maintain clinical measurements within normal limits will improve Outcome: Progressing   Problem: Safety: Goal: Periods of time without injury will increase Outcome: Progressing

## 2019-12-14 NOTE — Progress Notes (Signed)
Recreation Therapy Notes   Date: 12/14/2019  Time: 9:30 am   Location: Craft room   Behavioral response: N/A   Intervention Topic: Relaxation      Discussion/Intervention: Patient did not attend group.   Clinical Observations/Feedback:  Patient did not attend group.   Aliyyah Riese LRT/CTRS        Esma Kilts 12/14/2019 11:36 AM

## 2019-12-14 NOTE — Progress Notes (Signed)
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 Patient reports that he is doing okay today.  Patient today answers most questions with simple answers.  Patient does state that he remembers going to [place] for substance treatment but does not remember what type of treatment he received when he was there.  The patient was asked where he was that he stated that he was in [facility] and was here for substance abuse treatment with [employer].  Patient continues staring at his word search during the entire evaluation.  Patient does deny having any suicidal or homicidal ideations and patient is unable to state exactly where he would live that if he was discharged from the hospital and answers "the usual" when asked what he does outside of the hospital.  Patient is unable to give specifics about certain questions.  Principal Problem: Schizophreniform disorder ([place]) Diagnosis: Principal Problem:   Schizophreniform disorder ([place])  Total Time spent with patient: 20 minutes  Past Psychiatric History: No records available except a mention that he had substance abuse treatment in January.  He had an episode of drinking rubbing alcohol between then and now but it is not clear how that was treated.  No known past psychiatric treatment or medication.  Past Medical History:  Past Medical History:  Diagnosis Date  . Bizarre behavior 12/02/2019   History reviewed. No pertinent surgical history. Family History: History reviewed. No pertinent family history. Family Psychiatric  History: None reported Social History:  Social History   Substance and Sexual Activity  Alcohol Use None     Social History   Substance and Sexual Activity  Drug Use Not on file    Social History   Socioeconomic History  . Marital status: Single    Spouse name: Not on file  . Number of children: Not  on file  . Years of education: Not on file  . Highest education level: Not on file  Occupational History  . Not on file  Tobacco Use  . Smoking status: Never Smoker  . Smokeless tobacco: Never Used  Substance and Sexual Activity  . Alcohol use: Not on file  . Drug use: Not on file  . Sexual activity: Not on file  Other Topics Concern  . Not on file  Social History Narrative  . Not on file   Social Determinants of Health   Financial Resource Strain:   . Difficulty of Paying Living Expenses:   Food Insecurity:   . Worried About Charity fundraiser in the Last Year:   . Arboriculturist in the Last Year:   Transportation Needs:   . Film/video editor (Medical):   Marland Kitchen Lack of Transportation (Non-Medical):   Physical Activity:   . Days of Exercise per Week:   . Minutes of Exercise per Session:   Stress:   . Feeling of Stress :   Social Connections:   . Frequency of Communication with Friends and Family:   . Frequency of Social Gatherings with Friends and Family:   . Attends Religious Services:   . Active Member of Clubs or Organizations:   . Attends Archivist Meetings:   Marland Kitchen Marital Status:    Additional Social History:                         Sleep: Good  Appetite:  Fair  Current Medications: Current Facility-Administered Medications  Medication Dose Route Frequency Provider Last Rate Last Admin  . acetaminophen (TYLENOL) tablet 650 mg  650 mg Oral Q6H PRN Dixon, Rashaun M, NP      . alum & mag hydroxide-simeth (MAALOX/MYLANTA) 200-200-20 MG/5ML suspension 30 mL  30 mL Oral Q4H PRN Dixon, Rashaun M, NP      . benztropine (COGENTIN) tablet 0.5 mg  0.5 mg Oral BID PRN Clapacs, John T, MD      . FLUoxetine (PROZAC) capsule 20 mg  20 mg Oral Daily Ford Peddie, Gerlene Burdock, FNP   20 mg at 12/14/19 0820  . hydrOXYzine (ATARAX/VISTARIL) tablet 25 mg  25 mg Oral TID PRN Jearld Lesch, NP   25 mg at 12/11/19 2143  . magnesium hydroxide (MILK OF MAGNESIA)  suspension 30 mL  30 mL Oral Daily PRN Dixon, Rashaun M, NP      . OLANZapine zydis (ZYPREXA) disintegrating tablet 15 mg  15 mg Oral QHS Clapacs, Jackquline Denmark, MD   15 mg at 12/13/19 2045  . traZODone (DESYREL) tablet 50 mg  50 mg Oral QHS PRN Jearld Lesch, NP   50 mg at 12/12/19 2133    Lab Results: No results found for this or any previous visit (from the past 48 hour(s)).  Blood Alcohol level:  Lab Results  Component Value Date   ETH <10 12/01/2019    Metabolic Disorder Labs: Lab Results  Component Value Date   HGBA1C 5.1 12/03/2019   MPG 99.67 12/03/2019   No results found for: PROLACTIN No results found for: CHOL, TRIG, HDL, CHOLHDL, VLDL, LDLCALC  Physical Findings: AIMS:  , ,  ,  ,    CIWA:    COWS:     Musculoskeletal: Strength & Muscle Tone: within normal limits Gait & Station: normal Patient leans: N/A  Psychiatric Specialty Exam: Physical Exam  Nursing note and vitals reviewed. Constitutional: He is oriented to person, place, and time. He appears well-developed and well-nourished.  Cardiovascular: Normal rate.  Respiratory: Effort normal.  Musculoskeletal:        General: Normal range of motion.  Neurological: He is alert and oriented to person, place, and time.  Skin: Skin is warm.  Psychiatric: His affect is blunt. His speech is delayed. He is slowed. Cognition and memory are impaired. He expresses inappropriate judgment. He is noncommunicative.    Review of Systems  Constitutional: Negative.   HENT: Negative.   Eyes: Negative.   Respiratory: Negative.   Cardiovascular: Negative.   Gastrointestinal: Negative.   Genitourinary: Negative.   Musculoskeletal: Negative.   Skin: Negative.   Neurological: Negative.   Psychiatric/Behavioral: Negative.     Blood pressure 104/72, pulse (!) 56, temperature 98.3 F (36.8 C), temperature source Oral, resp. rate 12, height 5\' 8"  (1.727 m), weight 71.7 kg, SpO2 98 %.Body mass index is 24.02 kg/m.  General  Appearance: Casual  Eye Contact:  Minimal  Speech:  Slow  Volume:  Decreased  Mood:  Euthymic  Affect:  Blunt  Thought Process:  Disorganized  Orientation:  Full (Time, Place, and Person)  Thought Content:  Tangential  Suicidal Thoughts:  No  Homicidal Thoughts:  No  Memory:  Immediate;   Fair Recent;   Poor Remote;   Poor  Judgement:  Impaired  Insight:  Shallow  Psychomotor Activity:  Normal  Concentration:  Concentration: Fair  Recall:  of Knowledge:  Fair  Language:  Poor  Akathisia:  No  Handed:  Right  AIMS (if indicated):     Assets:  Physical Health  ADL's:  Impaired  Cognition:  Impaired,  Mild  Sleep:  Number of Hours: 8.3   Assessment: Patient presents in the day room doing a word search.  Patient is a little more talkative today but still has simple answers.  Patient using generic answers to answer open-ended questions such as "the usual".  He has continued to deny any suicidal or homicidal ideations and when asked about hallucinations stated he does not think so.  Patient has continued showing compulsive mannerisms was walking around the unit.  We will continue current medication regimen today.  Patient's heart rate has shown to be 56 all over vital signs within normal limits.  Patient is continued to show some bizarre behaviors there are some possible psychotic features that would prevent him from performing normal ADLs outside of the hospital setting at this time  Treatment Plan Summary: Daily contact with patient to assess and evaluate symptoms and progress in treatment and Medication management Continue Cogentin 0.5 mg p.o. twice daily as needed for tremors Continue Prozac 20 mg p.o. daily for compulsions Continue Vistaril 25 mg p.o. 3 times daily as needed for anxiety Continue Zyprexa 15 mg p.o. nightly for schizophreniform Continue trazodone 50 mg p.o. nightly as needed for insomnia Encourage group therapy participation Continue every 15 minute  safety checks  Maryfrances Bunnell, FNP 12/14/2019, 9:30 AM

## 2019-12-14 NOTE — Progress Notes (Signed)
Pt has been calm, cooperative and med compliant. Pt was  unsure if he should walk through a door or not. He hesitated even after I was calling him. He is also showing hesitation when walking down the halls. He looks at the floor a lot. Torrie Mayers RN

## 2019-12-15 MED ORDER — OLANZAPINE 5 MG PO TBDP
20.0000 mg | ORAL_TABLET | Freq: Every day | ORAL | Status: DC
  Administered 2019-12-15 – 2019-12-18 (×4): 20 mg via ORAL
  Filled 2019-12-15 (×4): qty 4

## 2019-12-15 NOTE — Progress Notes (Signed)
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 Patient was still noted making bizarre posturing, and appears responding to internal stimuli. Patient is complaint with medication regimen, 15 minutes safety checks maintained will continue to monitor.

## 2019-12-15 NOTE — Progress Notes (Signed)
Recreation Therapy Notes  Date: 12/15/2019  Time: 9:30 am   Location: Craft room   Behavioral response: N/A   Intervention Topic: Happiness       Discussion/Intervention: Patient did not attend group.   Clinical Observations/Feedback:  Patient did not attend group.   Kahlani Graber LRT/CTRS        Esperanza Madrazo 12/15/2019 10:49 AM

## 2019-12-15 NOTE — Plan of Care (Signed)
Patient continues to have ritualistic behaviors like looking at the floor and walking backward.Patient had a logical conversation with staff while he was shaving.Patient talks about his interests,beliefs and asked questions to staff also.Patient stated that he got bored here.Denies SI,HI and AVH.Compliant with medications.Appetite and energy level good.Support and encouragement given.

## 2019-12-15 NOTE — Plan of Care (Signed)
  Problem: Education: Goal: Emotional status will improve Outcome: Progressing   Problem: Safety: Goal: Periods of time without injury will increase Outcome: Progressing  Patient appears progressing presently denies SI/HI/AVH able to verbalize concerns to staff, will continue to monitor.

## 2019-12-15 NOTE — Progress Notes (Signed)
[facility] MD Progress Note  12/15/2019 2:00 PM Eric Pineda  MRN:  [id number] Subjective: Follow-up for this 24 year old man with psychosis.  Patient seen chart reviewed.  I also spoke with his sister on the telephone.  Patient continues to be peculiar and difficult to interact with.  He was somewhat improved during the interview today and that parts of the conversation were lucid and at times he spoke in full sentences.  When asked however any detailed or abstract question he becomes very vague and starts using stereotypical words that have no real meaning in the conversation.  Affect remains flat and inappropriate.  I talk with him today trying to point out all of the symptoms to see if I could elucidate any understanding from him but to no real affect.  Sister reports that the family feels he is slightly better.  Patient has not been aggressive violent or dangerous.  He has been compliant with medicine without any complaints. Principal Problem: Schizophreniform disorder (HCC) Diagnosis: Principal Problem:   Schizophreniform disorder (HCC)  Total Time spent with patient: 30 minutes  Past Psychiatric History: Patient has no known past psychiatric history  Past Medical History:  Past Medical History:  Diagnosis Date  . Bizarre behavior 12/02/2019   History reviewed. No pertinent surgical history. Family History: History reviewed. No pertinent family history. Family Psychiatric  History: See previous Social History:  Social History   Substance and Sexual Activity  Alcohol Use None     Social History   Substance and Sexual Activity  Drug Use Not on file    Social History   Socioeconomic History  . Marital status: Single    Spouse name: Not on file  . Number of children: Not on file  . Years of education: Not on file  . Highest education level: Not on file  Occupational History  . Not on file  Tobacco Use  . Smoking status: Never Smoker  . Smokeless tobacco: Never Used  Substance  and Sexual Activity  . Alcohol use: Not on file  . Drug use: Not on file  . Sexual activity: Not on file  Other Topics Concern  . Not on file  Social History Narrative  . Not on file   Social Determinants of Health   Financial Resource Strain:   . Difficulty of Paying Living Expenses:   Food Insecurity:   . Worried About Programme researcher, broadcasting/film/video in the Last Year:   . Barista in the Last Year:   Transportation Needs:   . Freight forwarder (Medical):   Marland Kitchen Lack of Transportation (Non-Medical):   Physical Activity:   . Days of Exercise per Week:   . Minutes of Exercise per Session:   Stress:   . Feeling of Stress :   Social Connections:   . Frequency of Communication with Friends and Family:   . Frequency of Social Gatherings with Friends and Family:   . Attends Religious Services:   . Active Member of Clubs or Organizations:   . Attends Banker Meetings:   Marland Kitchen Marital Status:    Additional Social History:                         Sleep: Fair  Appetite:  Fair  Current Medications: Current Facility-Administered Medications  Medication Dose Route Frequency Provider Last Rate Last Admin  . acetaminophen (TYLENOL) tablet 650 mg  650 mg Oral Q6H PRN Jearld Lesch, NP      .  alum & mag hydroxide-simeth (MAALOX/MYLANTA) 200-200-20 MG/5ML suspension 30 mL  30 mL Oral Q4H PRN Dixon, Rashaun M, NP      . benztropine (COGENTIN) tablet 0.5 mg  0.5 mg Oral BID PRN Severiano Utsey T, MD      . FLUoxetine (PROZAC) capsule 20 mg  20 mg Oral Daily Money, Lowry Ram, FNP   20 mg at 12/15/19 0816  . hydrOXYzine (ATARAX/VISTARIL) tablet 25 mg  25 mg Oral TID PRN Deloria Lair, NP   25 mg at 12/11/19 2143  . magnesium hydroxide (MILK OF MAGNESIA) suspension 30 mL  30 mL Oral Daily PRN Dixon, Rashaun M, NP      . OLANZapine zydis (ZYPREXA) disintegrating tablet 20 mg  20 mg Oral QHS Goro Wenrick T, MD      . traZODone (DESYREL) tablet 50 mg  50 mg Oral QHS PRN  Deloria Lair, NP   50 mg at 12/14/19 2121    Lab Results: No results found for this or any previous visit (from the past 48 hour(s)).  Blood Alcohol level:  Lab Results  Component Value Date   ETH <10 27/02/2375    Metabolic Disorder Labs: Lab Results  Component Value Date   HGBA1C 5.1 12/03/2019   MPG 99.67 12/03/2019   No results found for: PROLACTIN No results found for: CHOL, TRIG, HDL, CHOLHDL, VLDL, LDLCALC  Physical Findings: AIMS:  , ,  ,  ,    CIWA:    COWS:     Musculoskeletal: Strength & Muscle Tone: within normal limits Gait & Station: normal Patient leans: N/A  Psychiatric Specialty Exam: Physical Exam  Nursing note and vitals reviewed. Constitutional: He appears well-developed and well-nourished.  HENT:  Head: Normocephalic and atraumatic.  Eyes: Pupils are equal, round, and reactive to light. Conjunctivae are normal.  Cardiovascular: Regular rhythm and normal heart sounds.  Respiratory: Effort normal. No respiratory distress.  GI: Soft.  Musculoskeletal:        General: Normal range of motion.     Cervical back: Normal range of motion.  Neurological: He is alert.  Skin: Skin is warm and dry.  Psychiatric: His affect is blunt. His speech is delayed and tangential. He is slowed. Cognition and memory are impaired. He expresses inappropriate judgment. He expresses no homicidal and no suicidal ideation. He is noncommunicative.    Review of Systems  Constitutional: Negative.   HENT: Negative.   Eyes: Negative.   Respiratory: Negative.   Cardiovascular: Negative.   Gastrointestinal: Negative.   Musculoskeletal: Negative.   Skin: Negative.   Neurological: Negative.   Psychiatric/Behavioral: Negative.     Blood pressure (!) 121/92, pulse 85, temperature 98 F (36.7 C), resp. rate 16, height 5\' 8"  (1.727 m), weight 71.7 kg, SpO2 100 %.Body mass index is 24.02 kg/m.  General Appearance: Guarded  Eye Contact:  Minimal  Speech:  Slow  Volume:   Decreased  Mood:  Euthymic  Affect:  Flat  Thought Process:  Disorganized  Orientation:  Full (Time, Place, and Person)  Thought Content:  Illogical and Tangential  Suicidal Thoughts:  No  Homicidal Thoughts:  No  Memory:  Immediate;   Fair Recent;   Fair Remote;   Poor  Judgement:  Impaired  Insight:  Shallow  Psychomotor Activity:  Mannerisms  Concentration:  Concentration: Poor  Recall:  Poor  Fund of Knowledge:  Poor  Language:  Fair  Akathisia:  No  Handed:  Right  AIMS (if indicated):  Assets:  Housing Social Support  ADL's:  Impaired  Cognition:  Impaired,  Mild  Sleep:  Number of Hours: 7.75     Treatment Plan Summary: Daily contact with patient to assess and evaluate symptoms and progress in treatment, Medication management and Plan Continues to have symptoms most consistent I believe with negative symptoms schizophrenia.  Possible slight improvement.  No complaints about medicine.  Family agrees with assessment.  No new physical symptoms.  Blood pressure today slightly elevated but not extremely so.  Plan will be to increase olanzapine to 20 mg at night.  Continue newly added fluoxetine.  I explained to his sister that we may be looking at discharge next week even if there has not been profound improvement given that he has remained stable and without dangerousness.  Family seems to understand the plan.  Mordecai Rasmussen, MD 12/15/2019, 2:00 PM

## 2019-12-15 NOTE — [facility] Group Notes (Signed)
Feelings Around Relapse 12/15/2019 1PM  Type of Therapy and Topic:  Group Therapy:  Feelings around Relapse and Recovery  Participation Level:  Did Not Attend   Description of Group:    Patients in this group will discuss emotions they experience before and after a relapse. They will process how experiencing these feelings, or avoidance of experiencing them, relates to having a relapse. Facilitator will guide patients to explore emotions they have related to recovery. Patients will be encouraged to process which emotions are more powerful. They will be guided to discuss the emotional reaction significant others in their lives may have to patients' relapse or recovery. Patients will be assisted in exploring ways to respond to the emotions of others without this contributing to a relapse.  Therapeutic Goals: 1. Patient will identify two or more emotions that lead to a relapse for them 2. Patient will identify two emotions that result when they relapse 3. Patient will identify two emotions related to recovery 4. Patient will demonstrate ability to communicate their needs through discussion and/or role plays   Summary of Patient Progress:     Therapeutic Modalities:   Cognitive Behavioral Therapy Solution-Focused Therapy Assertiveness Training Relapse Prevention Therapy   Suzan Slick, LCSW 12/15/2019 1:59 PM

## 2019-12-16 NOTE — Plan of Care (Signed)
Patient oriented to unit. Patient's safety is maintained on unit. Patient denies SI/HI/AVH. Patient is minimal during assessment. Patient is ritualistic in milieu and is frequently seen interacting with internal stimuli. Patient is adherent with scheduled medication. Patient's safety is maintained on the unit   Problem: Education: Goal: Knowledge of [facility] Education information/materials will improve Outcome: Not Progressing Goal: Emotional status will improve Outcome: Not Progressing Goal: Mental status will improve Outcome: Not Progressing Goal: Verbalization of understanding the information provided will improve Outcome: Not Progressing

## 2019-12-16 NOTE — Progress Notes (Signed)
Patient alert and oriented x 3 with periods of confusion to situation no distress noted, his thoughts are disorganized and incoherent at tomes, he was noted posturing on the unit and during conversation he showed he  to have delayed response and forwards very little. Information, no distress noted, complaint with medication regimen, 15 minutes safety checks maintained, no distress noted,  will continue to monitor .

## 2019-12-16 NOTE — [facility] Group Notes (Signed)
LCSW Group Therapy Note  12/16/2019 1:00pm  Type of Therapy and Topic:  Group Therapy:  Cognitive Distortions  Participation Level:  Did Not Attend   Description of Group:    Patients in this group will be introduced to the topic of cognitive distortions.  Patients will identify and describe cognitive distortions, describe the feelings these distortions create for them.  Patients will identify one or more situations in their personal life where they have cognitively distorted thinking and will verbalize challenging this cognitive distortion through positive thinking skills.  Patients will practice the skill of using positive affirmations to challenge cognitive distortions using affirmation cards.    Therapeutic Goals:  1. Patient will identify two or more cognitive distortions they have used 2. Patient will identify one or more emotions that stem from use of a cognitive distortion 3. Patient will demonstrate use of a positive affirmation to counter a cognitive distortion through discussion and/or role play. 4. Patient will describe one way cognitive distortions can be detrimental to wellness   Summary of Patient Progress:  X   Therapeutic Modalities:   Cognitive Behavioral Therapy Motivational Interviewing   Teresita Madura, MSW, [facility] Clinical Social Worker  12/16/2019 3:01 PM

## 2019-12-16 NOTE — Tx Team (Signed)
Interdisciplinary Treatment and Diagnostic Plan Update  12/16/2019 Time of Session: 9am Eric Pineda MRN: [id number]  Principal Diagnosis: Schizophreniform disorder [facility])  Secondary Diagnoses: Principal Problem:   Schizophreniform disorder ([place])   Current Medications:  Current Facility-Administered Medications  Medication Dose Route Frequency Provider Last Rate Last Admin  . acetaminophen (TYLENOL) tablet 650 mg  650 mg Oral Q6H PRN Dixon, Rashaun M, NP      . alum & mag hydroxide-simeth (MAALOX/MYLANTA) 200-200-20 MG/5ML suspension 30 mL  30 mL Oral Q4H PRN Dixon, Rashaun M, NP      . benztropine (COGENTIN) tablet 0.5 mg  0.5 mg Oral BID PRN Clapacs, John T, MD      . FLUoxetine (PROZAC) capsule 20 mg  20 mg Oral Daily Money, Lowry Ram, FNP   20 mg at 12/16/19 5956  . hydrOXYzine (ATARAX/VISTARIL) tablet 25 mg  25 mg Oral TID PRN Deloria Lair, NP   25 mg at 12/11/19 2143  . magnesium hydroxide (MILK OF MAGNESIA) suspension 30 mL  30 mL Oral Daily PRN Dixon, Rashaun M, NP      . OLANZapine zydis (ZYPREXA) disintegrating tablet 20 mg  20 mg Oral QHS Clapacs, John T, MD   20 mg at 12/15/19 2100  . traZODone (DESYREL) tablet 50 mg  50 mg Oral QHS PRN Deloria Lair, NP   50 mg at 12/14/19 2121   PTA Medications: No medications prior to admission.    Patient Stressors:    Patient Strengths:    Treatment Modalities: Medication Management, Group therapy, Case management,  1 to 1 session with clinician, Psychoeducation, Recreational therapy.   Physician Treatment Plan for Primary Diagnosis: Schizophreniform disorder [facility]) Long Term Goal(s): Improvement in symptoms so as ready for discharge Improvement in symptoms so as ready for discharge   Short Term Goals: Ability to demonstrate self-control will improve Ability to identify and develop effective coping behaviors will improve Ability to maintain clinical measurements within normal limits will improve Compliance with  prescribed medications will improve  Medication Management: Evaluate patient's response, side effects, and tolerance of medication regimen.  Therapeutic Interventions: 1 to 1 sessions, Unit Group sessions and Medication administration.  Evaluation of Outcomes: Not Progressing  Physician Treatment Plan for Secondary Diagnosis: Principal Problem:   Schizophreniform disorder ([place])  Long Term Goal(s): Improvement in symptoms so as ready for discharge Improvement in symptoms so as ready for discharge   Short Term Goals: Ability to demonstrate self-control will improve Ability to identify and develop effective coping behaviors will improve Ability to maintain clinical measurements within normal limits will improve Compliance with prescribed medications will improve     Medication Management: Evaluate patient's response, side effects, and tolerance of medication regimen.  Therapeutic Interventions: 1 to 1 sessions, Unit Group sessions and Medication administration.  Evaluation of Outcomes: Not Progressing   RN Treatment Plan for Primary Diagnosis: Schizophreniform disorder ([place]) Long Term Goal(s): Knowledge of disease and therapeutic regimen to maintain health will improve  Short Term Goals: Ability to verbalize feelings will improve, Ability to identify and develop effective coping behaviors will improve and Compliance with prescribed medications will improve  Medication Management: RN will administer medications as ordered by provider, will assess and evaluate patient's response and provide education to patient for prescribed medication. RN will report any adverse and/or side effects to prescribing provider.  Therapeutic Interventions: 1 on 1 counseling sessions, Psychoeducation, Medication administration, Evaluate responses to treatment, Monitor vital signs and CBGs as ordered, Perform/monitor CIWA, COWS, AIMS and Fall  Risk screenings as ordered, Perform wound care treatments as  ordered.  Evaluation of Outcomes: Not Progressing   LCSW Treatment Plan for Primary Diagnosis: Schizophreniform disorder [facility]) Long Term Goal(s): Safe transition to appropriate next level of care at discharge, Engage patient in therapeutic group addressing interpersonal concerns.  Short Term Goals: Engage patient in aftercare planning with referrals and resources  Therapeutic Interventions: Assess for all discharge needs, 1 to 1 time with Social worker, Explore available resources and support systems, Assess for adequacy in community support network, Educate family and significant other(s) on suicide prevention, Complete Psychosocial Assessment, Interpersonal group therapy.  Evaluation of Outcomes: Not Progressing   Progress in Treatment: Attending groups: No. Participating in groups: No. Taking medication as prescribed: Yes. Toleration medication: Yes. Family/Significant other contact made: Yes, individual(s) contacted:  SPE completed with the patient's father.  Patient understands diagnosis: Unknown Discussing patient identified problems/goals with staff: No. Medical problems stabilized or resolved: Yes. Denies suicidal/homicidal ideation: Unknown Issues/concerns per patient self-inventory: No. Other: NA  New problem(s) identified: No, Describe:  None reported  New Short Term/Long Term Goal(s): Update 12/11/2019: elimination of symptoms of psychosis, medication management for mood stabilization; elimination of SI thoughts; development of comprehensive mental wellness/sobriety plan.  Patient Goals:  Pt given opportunity to attend meeting but declined. No goal provided at this time.  Update 12/11/2019:  No changes at this time.  Discharge Plan or Barriers: Update 12/11/2019:  Patient declines aftercare at this time.    Reason for Continuation of Hospitalization: Medication stabilization  Estimated Length of Stay: 1-7 days  Recreational Therapy: Patient Stressors: N/A Patient  Goal: Patient will engage in groups without prompting or encouragement from LRT x3 group sessions within 5 recreation therapy group sessions  Attendees: Patient:  12/16/2019 10:59 AM  Physician: Dr. Otelia Santee, MD 12/16/2019 10:59 AM  Nursing: Shelia Media, RN 12/16/2019 10:59 AM  RN Care Manager: 12/16/2019 10:59 AM  Social Worker: Teresita Madura, [place] 12/16/2019 10:59 AM  Recreational Therapist:  12/16/2019 10:59 AM  Other:  12/16/2019 10:59 AM  Other:  12/16/2019 10:59 AM  Other: 12/16/2019 10:59 AM    Scribe for Treatment Team: Jimmey Ralph, LCSWA 12/16/2019 10:59 AM

## 2019-12-16 NOTE — Progress Notes (Signed)
[facility] MD Progress Note  12/16/2019 9:28 AM Eric Pineda  MRN:  [id number] Subjective:   Eric Pineda is 51 he is known to have a schizophreniform condition, admitted for bizarre behaviors personality changes and initial volatility with his father but the patient was poorly informative on admission and remains poorly informative today.  He is in bed he asks me to reintroduce myself but has very little to say on mental status exam he denies auditory or visual hallucinations denies thoughts of harming self or others but expresses little information due to probable poverty of content to speech and thought  Principal Problem: Schizophreniform disorder (HCC) Diagnosis: Principal Problem:   Schizophreniform disorder (HCC)  Total Time spent with patient: 20 minutes  Past Psychiatric History: The drug screen from 3/12 was negative for all compounds tested  Past Medical History:  Past Medical History:  Diagnosis Date  . Bizarre behavior 12/02/2019   History reviewed. No pertinent surgical history. Family History: History reviewed. No pertinent family history. Family Psychiatric  History: Patient denied any Social History:  Social History   Substance and Sexual Activity  Alcohol Use None     Social History   Substance and Sexual Activity  Drug Use Not on file    Social History   Socioeconomic History  . Marital status: Single    Spouse name: Not on file  . Number of children: Not on file  . Years of education: Not on file  . Highest education level: Not on file  Occupational History  . Not on file  Tobacco Use  . Smoking status: Never Smoker  . Smokeless tobacco: Never Used  Substance and Sexual Activity  . Alcohol use: Not on file  . Drug use: Not on file  . Sexual activity: Not on file  Other Topics Concern  . Not on file  Social History Narrative  . Not on file   Social Determinants of Health   Financial Resource Strain:   . Difficulty of Paying Living Expenses:   Food  Insecurity:   . Worried About Programme researcher, broadcasting/film/video in the Last Year:   . Barista in the Last Year:   Transportation Needs:   . Freight forwarder (Medical):   Marland Kitchen Lack of Transportation (Non-Medical):   Physical Activity:   . Days of Exercise per Week:   . Minutes of Exercise per Session:   Stress:   . Feeling of Stress :   Social Connections:   . Frequency of Communication with Friends and Family:   . Frequency of Social Gatherings with Friends and Family:   . Attends Religious Services:   . Active Member of Clubs or Organizations:   . Attends Banker Meetings:   Marland Kitchen Marital Status:    Additional Social History:                         Sleep: Good  Appetite:  Good  Current Medications: Current Facility-Administered Medications  Medication Dose Route Frequency Provider Last Rate Last Admin  . acetaminophen (TYLENOL) tablet 650 mg  650 mg Oral Q6H PRN Dixon, Rashaun M, NP      . alum & mag hydroxide-simeth (MAALOX/MYLANTA) 200-200-20 MG/5ML suspension 30 mL  30 mL Oral Q4H PRN Dixon, Rashaun M, NP      . benztropine (COGENTIN) tablet 0.5 mg  0.5 mg Oral BID PRN Clapacs, John T, MD      . FLUoxetine (PROZAC) capsule 20 mg  20 mg Oral Daily Money, Lowry Ram, FNP   20 mg at 12/16/19 4196  . hydrOXYzine (ATARAX/VISTARIL) tablet 25 mg  25 mg Oral TID PRN Deloria Lair, NP   25 mg at 12/11/19 2143  . magnesium hydroxide (MILK OF MAGNESIA) suspension 30 mL  30 mL Oral Daily PRN Dixon, Rashaun M, NP      . OLANZapine zydis (ZYPREXA) disintegrating tablet 20 mg  20 mg Oral QHS Clapacs, John T, MD   20 mg at 12/15/19 2100  . traZODone (DESYREL) tablet 50 mg  50 mg Oral QHS PRN Deloria Lair, NP   50 mg at 12/14/19 2121    Lab Results: No results found for this or any previous visit (from the past 48 hour(s)).  Blood Alcohol level:  Lab Results  Component Value Date   ETH <10 22/29/7989    Metabolic Disorder Labs: Lab Results  Component Value  Date   HGBA1C 5.1 12/03/2019   MPG 99.67 12/03/2019   No results found for: PROLACTIN No results found for: CHOL, TRIG, HDL, CHOLHDL, VLDL, LDLCALC  Physical Findings: AIMS:  , ,  ,  ,    CIWA:    COWS:     Musculoskeletal: Strength & Muscle Tone: within normal limits Gait & Station: normal Patient leans: N/A  Psychiatric Specialty Exam: Physical Exam  Review of Systems  Blood pressure (!) 121/92, pulse 85, temperature 98 F (36.7 C), resp. rate 16, height 5\' 8"  (1.727 m), weight 71.7 kg, SpO2 100 %.Body mass index is 24.02 kg/m.  General Appearance: Casual  Eye Contact:  Minimal  Speech:  Normal Rate  Volume:  Decreased  Mood:  Somewhat aloof and disconnected  Affect:  Constricted  Thought Process:  Linear and Descriptions of Associations: Circumstantial  Orientation:  Full (Time, Place, and Person)  Thought Content:  Poverty of content but denies hallucinations or delusional believes when screened  Suicidal Thoughts:  No  Homicidal Thoughts:  No  Memory:  Immediate;   Poor Recent;   Fair  Judgement:  Fair  Insight:  Shallow  Psychomotor Activity:  Normal  Concentration:  Concentration: Fair  Recall:  [employer] of Knowledge:  Fair  Language:  Fair  Akathisia:  Negative  Handed:  Right  AIMS (if indicated):     Assets:  Communication Skills Desire for Improvement  ADL's:  Intact  Cognition:  WNL  Sleep:  Number of Hours: 7.75     Treatment Plan Summary: Daily contact with patient to assess and evaluate symptoms and progress in treatment and Medication management  Continue current med regimen of olanzapine antidepressant therapy continue to monitor for safety no change in precautions continue to try and engage in more fuller reality based therapy but again poverty of content problematic  Eric Muto, MD 12/16/2019, 9:28 AM

## 2019-12-17 NOTE — Progress Notes (Signed)
D: Patient still posturing at times in a ritualistic manner. Denies  SI HI and AVH. Contracts for safety. In bed a lot.  A: continue to monitor for safety R: Safety maintained.

## 2019-12-17 NOTE — Progress Notes (Signed)
[facility] MD Progress Note  12/17/2019 8:39 AM Eric Pineda  MRN:  [id number] Subjective:    Patient is 23 he was petition for involuntary commitment due to a cluster of symptoms related to his schizophreniform condition- He is medication compliant and cooperative  Today he actually engages a little more in the interview process, probably because I am more familiar to him now he is alert oriented to person place time and situation, he is again more conversant than he was yesterday but denies auditory or visual hallucinations, denies thoughts of harming self or others, has no involuntary movements, no EPS or TD.  He hopes to be discharged early in the week Principal Problem: Schizophreniform disorder ([place]) Diagnosis: Principal Problem:   Schizophreniform disorder ([place])  Total Time spent with patient: 15 minutes  Past Psychiatric History: As per HPI  Past Medical History:  Past Medical History:  Diagnosis Date  . Bizarre behavior 12/02/2019   History reviewed. No pertinent surgical history. Family History: History reviewed. No pertinent family history. Family Psychiatric  History: No new data shared Social History:  Social History   Substance and Sexual Activity  Alcohol Use None     Social History   Substance and Sexual Activity  Drug Use Not on file    Social History   Socioeconomic History  . Marital status: Single    Spouse name: Not on file  . Number of children: Not on file  . Years of education: Not on file  . Highest education level: Not on file  Occupational History  . Not on file  Tobacco Use  . Smoking status: Never Smoker  . Smokeless tobacco: Never Used  Substance and Sexual Activity  . Alcohol use: Not on file  . Drug use: Not on file  . Sexual activity: Not on file  Other Topics Concern  . Not on file  Social History Narrative  . Not on file   Social Determinants of Health   Financial Resource Strain:   . Difficulty of Paying Living Expenses:   Food  Insecurity:   . Worried About Charity fundraiser in the Last Year:   . Arboriculturist in the Last Year:   Transportation Needs:   . Film/video editor (Medical):   Marland Kitchen Lack of Transportation (Non-Medical):   Physical Activity:   . Days of Exercise per Week:   . Minutes of Exercise per Session:   Stress:   . Feeling of Stress :   Social Connections:   . Frequency of Communication with Friends and Family:   . Frequency of Social Gatherings with Friends and Family:   . Attends Religious Services:   . Active Member of Clubs or Organizations:   . Attends Archivist Meetings:   Marland Kitchen Marital Status:    Sleep: Good  Appetite:  Good  Current Medications: Current Facility-Administered Medications  Medication Dose Route Frequency Provider Last Rate Last Admin  . acetaminophen (TYLENOL) tablet 650 mg  650 mg Oral Q6H PRN Dixon, Rashaun M, NP      . alum & mag hydroxide-simeth (MAALOX/MYLANTA) 200-200-20 MG/5ML suspension 30 mL  30 mL Oral Q4H PRN Dixon, Rashaun M, NP      . benztropine (COGENTIN) tablet 0.5 mg  0.5 mg Oral BID PRN Clapacs, John T, MD      . FLUoxetine (PROZAC) capsule 20 mg  20 mg Oral Daily Money, Lowry Ram, FNP   20 mg at 12/17/19 8295  . hydrOXYzine (ATARAX/VISTARIL) tablet 25  mg  25 mg Oral TID PRN Jearld Lesch, NP   25 mg at 12/11/19 2143  . magnesium hydroxide (MILK OF MAGNESIA) suspension 30 mL  30 mL Oral Daily PRN Dixon, Rashaun M, NP      . OLANZapine zydis (ZYPREXA) disintegrating tablet 20 mg  20 mg Oral QHS Clapacs, Jackquline Denmark, MD   20 mg at 12/16/19 2107  . traZODone (DESYREL) tablet 50 mg  50 mg Oral QHS PRN Jearld Lesch, NP   50 mg at 12/14/19 2121    Lab Results: No results found for this or any previous visit (from the past 48 hour(s)).  Blood Alcohol level:  Lab Results  Component Value Date   ETH <10 12/01/2019    Metabolic Disorder Labs: Lab Results  Component Value Date   HGBA1C 5.1 12/03/2019   MPG 99.67 12/03/2019   No  results found for: PROLACTIN No results found for: CHOL, TRIG, HDL, CHOLHDL, VLDL, LDLCALC  Musculoskeletal: Strength & Muscle Tone: within normal limits Gait & Station: normal Patient leans: N/A  Psychiatric Specialty Exam: Physical Exam  Review of Systems  Blood pressure 129/82, pulse 69, temperature 98.3 F (36.8 C), temperature source Oral, resp. rate 18, height 5\' 8"  (1.727 m), weight 71.7 kg, SpO2 98 %.Body mass index is 24.02 kg/m.  General Appearance: Casual  Eye Contact:  Good  Speech:  Clear and Coherent  Volume:  Normal  Mood:  Euthymic  Affect:  Appropriate  Thought Process:  Coherent and Goal Directed  Orientation:  Full (Time, Place, and Person)  Thought Content:  Denies positive symptoms such as hallucinations and no delusional material discerned on exam  Suicidal Thoughts:  No  Homicidal Thoughts:  No  Memory:  Immediate;   Fair Recent;   Fair  Judgement:  Fair  Insight:  Fair  Psychomotor Activity:  Normal  Concentration:  Concentration: Fair and Attention Span: Good  Recall:  Good  Fund of Knowledge:  Fair  Language:  Good  Akathisia:  Negative  Handed:  Right  AIMS (if indicated):     Assets:  Physical Health Resilience Social Support  ADL's:  Intact  Cognition:  WNL  Sleep:  Number of Hours: 8.5     Treatment Plan Summary: Daily contact with patient to assess and evaluate symptoms and progress in treatment  Continue olanzapine for psychosis Continue trazodone as needed for insomnia Continue fluoxetine for negative symptoms and dysphoria Continue reality based therapy No change in precautions  Danni Leabo, MD 12/17/2019, 8:39 AM

## 2019-12-17 NOTE — Plan of Care (Signed)
  Problem: Activity: Goal: Interest or engagement in activities will improve Outcome: Not Progressing Goal: Sleeping patterns will improve Outcome: Not Progressing  D: Patient still posturing at times in a ritualistic manner. Denies  SI HI and AVH. Contracts for safety. In bed a lot.  A: continue to monitor for safety R: Safety maintained.

## 2019-12-17 NOTE — [facility] Group Notes (Signed)
LCSW Group Therapy Notes  Date and Time: 12/17/19 1:00PM  Type of Therapy and Topic: Group Therapy: Healthy Vs. Unhealthy Coping Strategies  Participation Level: [facility] PARTICIPATION LEVEL: Did Not Attend  Description of Group:  In this group, patients will be encouraged to explore their healthy and unhealthy coping strategics. Coping strategies are actions that we take to deal with stress, problems, or uncomfortable emotions in our daily lives. Each patient will be challenged to read some scenarios and discuss the unhealthy and healthy coping strategies within those scenarios. Also, each patient will be challenged to describe current healthy and unhealthy strategies that they use in their own lives and discuss the outcomes and barriers to those strategies. This group will be process-oriented, with patients participating in exploration of their own experiences as well as giving and receiving support and challenge from other group members.  Therapeutic Goals: 1. Patient will identify personal healthy and unhealthy coping strategies. 2. Patient will identify healthy and unhealthy coping strategies, in others, through scenarios.  3. Patient will identify expected outcomes of healthy and unhealthy coping strategies. 4. Patient will identify barriers to using healthy coping strategies.   Summary of Patient Progress:  X   Therapeutic Modalities:  Cognitive Behavioral Therapy Solution Focused Therapy Motivational Interviewing   Mont Steele Ledonne, MSW, LCSWA Clinical Social Worker    

## 2019-12-17 NOTE — Progress Notes (Signed)
F:  Focus of care on decreasing psychosis and increasing reality-based thinking.  D:  The patient was visible in the milieu, active, and behaviorally appropriate.  He was pleasant upon contact and denied thoughts of harming himself and others.  Ritualistic behaviors when ambulating the hallways remain.  Adequate food and fluids consumed, vital signs stable, and no problematic side effects from medications reported.       A:  Continue to monitor for safety and overall improvements in functioning.  Frequently assess psychotic symptoms.  15-minute safety checks.  R:  Patient continues to display bizarre behaviors.  Marland Kitchen

## 2019-12-18 MED ORDER — FLUOXETINE HCL 20 MG PO CAPS: 20.0000 mg | ORAL_CAPSULE | Freq: Every day | ORAL | 0 refills | Status: DC

## 2019-12-18 MED ORDER — TRAZODONE HCL 50 MG PO TABS: 50.0000 mg | ORAL_TABLET | Freq: Every evening | ORAL | 0 refills | Status: DC | PRN

## 2019-12-18 MED ORDER — OLANZAPINE 20 MG PO TBDP: 20.0000 mg | ORAL_TABLET | Freq: Every day | ORAL | 0 refills | Status: DC

## 2019-12-18 MED ORDER — HYDROXYZINE HCL 25 MG PO TABS: 25.0000 mg | ORAL_TABLET | Freq: Three times a day (TID) | ORAL | 0 refills | Status: DC | PRN

## 2019-12-18 NOTE — Plan of Care (Signed)
Patient stated that he is ready for discharge and he will stay on his medications and follow up appointments.Patient denies SI,HI and AVH.Patient did not go groups states " I am reading the pamphlets its helping me." Compliant with medications.Appetite and energy level good.Support and encouragement given.

## 2019-12-18 NOTE — Progress Notes (Signed)
Recreation Therapy Notes   Date: 12/18/2019  Time: 9:30 am   Location: Craft room   Behavioral response: N/A   Intervention Topic: Coping Skills  Discussion/Intervention: Patient did not attend group.   Clinical Observations/Feedback:  Patient did not attend group.   Allayah Raineri LRT/CTRS        Eric Pineda 12/18/2019 1:18 PM

## 2019-12-18 NOTE — Progress Notes (Signed)
[facility] MD Progress Note  12/18/2019 11:58 AM Eric Pineda  MRN:  [id number]   Subjective: Follow-up for this 24 year old male diagnosed with schizophreniform disorder.  Patient reports that he feels that he is doing much better today and states that he is interested in being discharged from the hospital.  He denies any suicidal homicidal ideations and denies any hallucinations.  Patient was asked why he felt that things were improving and he stated that going to the groups and reading the pamphlets and using them as they are supposed to be used is helped him be able to cope with a lot of the things and understand what is going on with him.  He feels that the medications have significantly helped him and he has been sleeping much better and eating better as well.  He reports that he plans to go back and live with his parents when he discharges from the hospital.  Patient sister is contacted for collateral information and she reports that she has noticed speaking with the patient that he seems to be improving and states that she is no concern for safety if he is discharged home tomorrow.  She does state to contact the patient's mother because she felt that she had questions and concerns.  Patient's mother, Eric Pineda, is contacted at [phone] and an interpreter was used to communicate with her via telephone.  Her questions were only concerning patient continuing his medications and his follow-up.  Patient has agreed to follow-up with social work and they will be assisting patient with arranging an appointment.  Patient's mother stated she had no safety concerns at this time and was only requesting the address for where to pick the patient up at tomorrow.  She states that she has to work to 3:30 PM and she can come and pick him up and then.  Principal Problem: Schizophreniform disorder ([facility]) Diagnosis: Principal Problem:   Schizophreniform disorder ([facility])  Total Time spent with patient: 20  minutes  Past Psychiatric History: No records available except a mention that he had substance abuse treatment in January.  He had an episode of drinking rubbing alcohol between then and now but it is not clear how that was treated.  No known past psychiatric treatment or medication.  Past Medical History:  Past Medical History:  Diagnosis Date  . Bizarre behavior 12/02/2019   History reviewed. No pertinent surgical history. Family History: History reviewed. No pertinent family history. Family Psychiatric  History: None known Social History:  Social History   Substance and Sexual Activity  Alcohol Use None     Social History   Substance and Sexual Activity  Drug Use Not on file    Social History   Socioeconomic History  . Marital status: Single    Spouse name: Not on file  . Number of children: Not on file  . Years of education: Not on file  . Highest education level: Not on file  Occupational History  . Not on file  Tobacco Use  . Smoking status: Never Smoker  . Smokeless tobacco: Never Used  Substance and Sexual Activity  . Alcohol use: Not on file  . Drug use: Not on file  . Sexual activity: Not on file  Other Topics Concern  . Not on file  Social History Narrative  . Not on file   Social Determinants of Health   Financial Resource Strain:   . Difficulty of Paying Living Expenses:   Food Insecurity:   . Worried About Running  Out of Food in the Last Year:   . [place] in the Last Year:   Transportation Needs:   . Lack of Transportation (Medical):   Marland Kitchen Lack of Transportation (Non-Medical):   Physical Activity:   . Days of Exercise per Week:   . Minutes of Exercise per Session:   Stress:   . Feeling of Stress :   Social Connections:   . Frequency of Communication with Friends and Family:   . Frequency of Social Gatherings with Friends and Family:   . Attends Religious Services:   . Active Member of Clubs or Organizations:   . Attends Theatre manager Meetings:   Marland Kitchen Marital Status:    Additional Social History:                         Sleep: Good  Appetite:  Good  Current Medications: Current Facility-Administered Medications  Medication Dose Route Frequency Provider Last Rate Last Admin  . acetaminophen (TYLENOL) tablet 650 mg  650 mg Oral Q6H PRN Dixon, Rashaun M, NP      . alum & mag hydroxide-simeth (MAALOX/MYLANTA) 200-200-20 MG/5ML suspension 30 mL  30 mL Oral Q4H PRN Dixon, Rashaun M, NP      . benztropine (COGENTIN) tablet 0.5 mg  0.5 mg Oral BID PRN Clapacs, John T, MD      . FLUoxetine (PROZAC) capsule 20 mg  20 mg Oral Daily Ivanka Kirshner, Lowry Ram, FNP   20 mg at 12/18/19 0853  . hydrOXYzine (ATARAX/VISTARIL) tablet 25 mg  25 mg Oral TID PRN Deloria Lair, NP   25 mg at 12/11/19 2143  . magnesium hydroxide (MILK OF MAGNESIA) suspension 30 mL  30 mL Oral Daily PRN Dixon, Rashaun M, NP      . OLANZapine zydis (ZYPREXA) disintegrating tablet 20 mg  20 mg Oral QHS Clapacs, Madie Reno, MD   20 mg at 12/17/19 2126  . traZODone (DESYREL) tablet 50 mg  50 mg Oral QHS PRN Deloria Lair, NP   50 mg at 12/14/19 2121    Lab Results: No results found for this or any previous visit (from the past 48 hour(s)).  Blood Alcohol level:  Lab Results  Component Value Date   ETH <10 42/68/3419    Metabolic Disorder Labs: Lab Results  Component Value Date   HGBA1C 5.1 12/03/2019   MPG 99.67 12/03/2019   No results found for: PROLACTIN No results found for: CHOL, TRIG, HDL, CHOLHDL, VLDL, LDLCALC  Physical Findings: AIMS:  , ,  ,  ,    CIWA:    COWS:     Musculoskeletal: Strength & Muscle Tone: within normal limits Gait & Station: normal Patient leans: N/A  Psychiatric Specialty Exam: Physical Exam  Nursing note and vitals reviewed. Constitutional: He is oriented to person, place, and time. He appears well-developed and well-nourished.  Cardiovascular: Normal rate.  Respiratory: Effort normal.   Musculoskeletal:        General: Normal range of motion.  Neurological: He is alert and oriented to person, place, and time.  Skin: Skin is warm.    Review of Systems  Constitutional: Negative.   HENT: Negative.   Eyes: Negative.   Respiratory: Negative.   Cardiovascular: Negative.   Gastrointestinal: Negative.   Genitourinary: Negative.   Musculoskeletal: Negative.   Skin: Negative.   Neurological: Negative.   Psychiatric/Behavioral: Negative.     Blood pressure 121/86, pulse 67, temperature 97.9 F (36.6  C), temperature source Oral, resp. rate 18, height 5\' 8"  (1.727 m), weight 71.7 kg, SpO2 99 %.Body mass index is 24.02 kg/m.  General Appearance: Casual  Eye Contact:  Fair  Speech:  Clear and Coherent and Normal Rate  Volume:  Decreased  Mood:  Euthymic  Affect:  Congruent  Thought Process:  Coherent and Descriptions of Associations: Intact  Orientation:  Full (Time, Place, and Person)  Thought Content:  WDL  Suicidal Thoughts:  No  Homicidal Thoughts:  No  Memory:  Immediate;   Fair Recent;   Fair Remote;   Fair  Judgement:  Fair  Insight:  Fair  Psychomotor Activity:  Normal  Concentration:  Concentration: Fair  Recall:  of Knowledge:  Fair  Language:  Fair  Akathisia:  No  Handed:  Right  AIMS (if indicated):     Assets:  Desire for Improvement Financial Resources/Insurance Housing Physical Health Social Support  ADL's:  Intact  Cognition:  WNL  Sleep:  Number of Hours: 8   Assessment: Patient presents walk around the milieu and appears to be interacting with peers and staff better.  Patient still has some compulsive actions when he is walking around the milieu but these have improved over the weekend.  Patient has been tolerating medications very well.  Patient has been reading and using reading materials provided and going to the day room for meals.  Patient is pleasant, calm, cooperative and his conversation has continued to improve with  each day.  Conversations appear to be much more logical and lucid.  Family has confirmed that they have no safety concerns with the patient discharging home tomorrow and arrangements have been made for the patient to be picked up after 3:30 PM tomorrow.  7-day sample of the patient's medications have been ordered.  Treatment Plan Summary: Daily contact with patient to assess and evaluate symptoms and progress in treatment and Medication management Continue Cogentin 0.5 mg p.o. twice daily as needed for tremors Continue Prozac 20 mg p.o. daily for compulsive behaviors Continue Vistaril 25 mg p.o. 3 times daily as needed for anxiety Continue Zyprexa Zydis 20 mg p.o. nightly for schizophreniform Continue trazodone 50 mg p.o. nightly as needed for insomnia Encourage group therapy participation Continue every 15 minute safety checks Plan discharge for tomorrow  [employer], FNP 12/18/2019, 11:58 AM

## 2019-12-18 NOTE — BHH Counselor (Signed)
CSW informed by NP(Travis) that pt mother(Yolanda, [phone]) has several questions and would like to speak with team prior to pt discharging on 3/30. NP used interpreter services(Natalie) to coordinate call. When NP asked mother about how she feels he is doing, she states "behavior a little better and hes a little more kind." NP informed mother the pt is agreeable to receiving outpatient treatment and medication management. NP also informed mother the pt is scheduled for discharge on 3/30. Mother states she will pick him up after 330p when she gets off of work.

## 2019-12-18 NOTE — [facility] Group Notes (Signed)
[facility] Group Notes:  (Nursing/MHT/Case Management/Adjunct)  Date:  12/18/2019  Time:  9:59 AM  Type of Therapy:  Community Meeting  Participation Level:  Did Not Attend   Lynelle Smoke [facility] 12/18/2019, 9:59 AM

## 2019-12-18 NOTE — Plan of Care (Signed)
  Problem: Activity: Goal: Interest or engagement in activities will improve Outcome: Progressing Goal: Sleeping patterns will improve Outcome: Progressing  D: Patient is more interactive. Continues with ritualistic walking and stepping behaviors in the halls. Denies SI, HI and AVH. Medication compliant. A: Continue to monitor for safety R: Safety maintained.

## 2019-12-18 NOTE — Progress Notes (Signed)
D: Patient is more interactive. Continues with ritualistic walking and stepping behaviors in the halls. Denies SI, HI and AVH. Medication compliant. A: Continue to monitor for safety R: Safety maintained.

## 2019-12-18 NOTE — [facility] Group Notes (Signed)
LCSW Group Therapy Note  12/18/2019 1:50 PM  Type of Therapy and Topic:  Group Therapy:  Feelings around Relapse and Recovery  Participation Level:  Did Not Attend   Description of Group:    Patients in this group will discuss emotions they experience before and after a relapse. They will process how experiencing these feelings, or avoidance of experiencing them, relates to having a relapse. Facilitator will guide patients to explore emotions they have related to recovery. Patients will be encouraged to process which emotions are more powerful. They will be guided to discuss the emotional reaction significant others in their lives may have to their relapse or recovery. Patients will be assisted in exploring ways to respond to the emotions of others without this contributing to a relapse.  Therapeutic Goals: 1. Patient will identify two or more emotions that lead to a relapse for them 2. Patient will identify two emotions that result when they relapse 3. Patient will identify two emotions related to recovery 4. Patient will demonstrate ability to communicate their needs through discussion and/or role plays   Summary of Patient Progress: x    Therapeutic Modalities:   Cognitive Behavioral Therapy Solution-Focused Therapy Assertiveness Training Relapse Prevention Therapy   Iris Pert, MSW, LCSW Clinical Social Work 12/18/2019 1:50 PM

## 2019-12-19 NOTE — B[facility] Counselor (Signed)
CSW attempted to follow up with patient on aftercare and patient declined.    Penni Homans, MSW, LCSW 12/19/2019 10:31 AM

## 2019-12-19 NOTE — Progress Notes (Signed)
Patient alert and oriented x 4 no confusion noted, no distress noted, his thoughts are organized and coherent, he was interacting appropriately with peers and staff, no bizarre behavior on the unit, he is  complaint with medication regimen, 15 minutes safety checks maintained, will continue to monitor.

## 2019-12-19 NOTE — Plan of Care (Signed)
  Problem: Activity: Goal: Sleeping patterns will improve Outcome: Progressing   Problem: Safety: Goal: Periods of time without injury will increase Outcome: Progressing  Patient appears sleeping at night no distress noted, and he is free of injury on the unit

## 2019-12-19 NOTE — Plan of Care (Signed)
  Problem: Group Participation Goal: STG - Patient will engage in groups without prompting or encouragement from LRT x3 group sessions within 5 recreation therapy group sessions Description: STG - Patient will engage in groups without prompting or encouragement from LRT x3 group sessions within 5 recreation therapy group sessions 12/19/2019 1132 by Ernest Haber, LRT Outcome: Not Applicable 12/12/1989 4445 by Ernest Haber, LRT Outcome: Not Met (add Reason) Note: Patient did not attend any groups.
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
## 2019-12-19 NOTE — Plan of Care (Signed)
Pt rates depression 7/10 which he wrote is "normal". Pt denies anxiety, SI, HI and AVH. Pt educated on care plan and verbalizes understanding. Torrie Mayers RN Problem: Education: Goal: Knowledge of [facility] Education information/materials will improve Outcome: Adequate for Discharge Goal: Emotional status will improve Outcome: Adequate for Discharge Goal: Mental status will improve Outcome: Adequate for Discharge Goal: Verbalization of understanding the information provided will improve Outcome: Adequate for Discharge   Problem: Activity: Goal: Interest or engagement in activities will improve Outcome: Adequate for Discharge Goal: Sleeping patterns will improve Outcome: Adequate for Discharge   Problem: Coping: Goal: Ability to verbalize frustrations and anger appropriately will improve Outcome: Adequate for Discharge Goal: Ability to demonstrate self-control will improve Outcome: Adequate for Discharge   Problem: Health Behavior/Discharge Planning: Goal: Identification of resources available to assist in meeting health care needs will improve Outcome: Adequate for Discharge Goal: Compliance with treatment plan for underlying cause of condition will improve Outcome: Adequate for Discharge   Problem: Physical Regulation: Goal: Ability to maintain clinical measurements within normal limits will improve Outcome: Adequate for Discharge   Problem: Safety: Goal: Periods of time without injury will increase Outcome: Adequate for Discharge

## 2019-12-19 NOTE — Progress Notes (Signed)
Recreation Therapy Notes  INPATIENT RECREATION TR PLAN  Patient Details Name: Eric Pineda MRN: [id number] DOB: 31-Dec-1995 Today's Date: 12/19/2019  Rec Therapy Plan Is patient appropriate for Therapeutic Recreation?: Yes Treatment times per week: at least 3 Estimated Length of Stay: 5-7 days TR Treatment/Interventions: Group participation (Comment)  Discharge Criteria Pt will be discharged from therapy if:: Discharged Treatment plan/goals/alternatives discussed and agreed upon by:: Patient/family  Discharge Summary Short term goals set: Patient will engage in groups without prompting or encouragement from LRT x3 group sessions within 5 recreation therapy group sessions Short term goals met: Not met Progress toward goals comments: Groups attended Reason goals not met: Patient did not attend any groups. Therapeutic equipment acquired: N/A Reason patient discharged from therapy: Discharge from hospital Pt/family agrees with progress & goals achieved: Yes Date patient discharged from therapy: 12/19/19   [name] 12/19/2019, 11:35 AM

## 2019-12-19 NOTE — Progress Notes (Signed)
Recreation Therapy Notes  Date: 12/19/2019  Time: 9:30 am   Location: Craft room   Behavioral response: N/A   Intervention Topic: Self-care   Discussion/Intervention: Patient did not attend group.   Clinical Observations/Feedback:  Patient did not attend group.   Ismar Yabut LRT/CTRS         Celestine Prim 12/19/2019 11:36 AM
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
## 2019-12-19 NOTE — [facility] Group Notes (Signed)
[facility] Group Notes:  (Nursing/MHT/Case Management/Adjunct)  Date:  12/19/2019  Time:  1:15 PM  Type of Therapy:  Community Meeting  Participation Level:  Did Not Attend   Lynelle Smoke [facility] 12/19/2019, 1:15 PM

## 2019-12-19 NOTE — [facility] Suicide Risk Assessment (Signed)
[facility] Discharge Suicide Risk Assessment   Principal Problem: Schizophreniform disorder [facility]) Discharge Diagnoses: Principal Problem:   Schizophreniform disorder (HCC)   Total Time spent with patient: 30 minutes  Musculoskeletal: Strength & Muscle Tone: within normal limits Gait & Station: normal Patient leans: N/A  Psychiatric Specialty Exam: Review of Systems  Constitutional: Negative.   HENT: Negative.   Eyes: Negative.   Respiratory: Negative.   Cardiovascular: Negative.   Gastrointestinal: Negative.   Musculoskeletal: Negative.   Skin: Negative.   Neurological: Negative.   Psychiatric/Behavioral: Positive for confusion and decreased concentration. Negative for agitation, behavioral problems, dysphoric mood and hallucinations.    Blood pressure 134/83, pulse 69, temperature (!) 97.5 F (36.4 C), temperature source Oral, resp. rate 16, height 5\' 8"  (1.727 m), weight 71.7 kg, SpO2 98 %.Body mass index is 24.02 kg/m.  General Appearance: Casual  Eye Contact::  Fair  Speech:  Slow409  Volume:  Decreased  Mood:  Euthymic  Affect:  Constricted  Thought Process:  Disorganized  Orientation:  Full (Time, Place, and Person)  Thought Content:  Tangential  Suicidal Thoughts:  No  Homicidal Thoughts:  No  Memory:  Immediate;   Fair Recent;   Poor Remote;   Poor  Judgement:  Impaired  Insight:  Shallow  Psychomotor Activity:  Restlessness  Concentration:  Poor  Recall:  Poor  Fund of Knowledge:Fair  Language: Fair  Akathisia:  No  Handed:  Right  AIMS (if indicated):     Assets:  Physical Health Resilience Social Support  Sleep:  Number of Hours: 6.75  Cognition: Impaired,  Mild  ADL's:  Impaired   Mental Status Per Nursing Assessment::   On Admission:  NA  Demographic Factors:  Male and Adolescent or young adult  Loss Factors: Financial problems/change in socioeconomic status  Historical Factors: NA  Risk Reduction Factors:   Positive social support and  Positive therapeutic relationship  Continued Clinical Symptoms:  Schizophrenia:   Less than 36 years old  Cognitive Features That Contribute To Risk:  Loss of executive function    Suicide Risk:  Minimal: No identifiable suicidal ideation.  Patients presenting with no risk factors but with morbid ruminations; may be classified as minimal risk based on the severity of the depressive symptoms    Plan Of Care/Follow-up recommendations:  Activity:  Activity as tolerated Diet:  Regular diet Other:  Follow-up with outpatient treatment as recommended continue current medicine  41, MD 12/19/2019, 12:17 PM

## 2020-04-16 ENCOUNTER — Emergency Department
Admission: EM | Admit: 2020-04-16 | Discharge: 2020-04-18 | Disposition: A | Discharge status: Other Acute Inpatient Hospital | Payer: Medicaid Other | Attending: Emergency Medicine | Admitting: Emergency Medicine

## 2020-04-16 ENCOUNTER — Encounter: Payer: Self-pay | Admitting: Emergency Medicine

## 2020-04-16 ENCOUNTER — Other Ambulatory Visit: Payer: Self-pay

## 2020-04-16 DIAGNOSIS — Z20822 Contact with and (suspected) exposure to covid-19: Secondary | ICD-10-CM | POA: Insufficient documentation

## 2020-04-16 DIAGNOSIS — F29 Unspecified psychosis not due to a substance or known physiological condition: Secondary | ICD-10-CM | POA: Insufficient documentation

## 2020-04-16 DIAGNOSIS — Z046 Encounter for general psychiatric examination, requested by authority: Secondary | ICD-10-CM | POA: Insufficient documentation

## 2020-04-16 DIAGNOSIS — Z79899 Other long term (current) drug therapy: Secondary | ICD-10-CM | POA: Insufficient documentation

## 2020-04-16 LAB — CBC
HCT: 40.6 % (ref 39.0–52.0)
Hemoglobin: 14 g/dL (ref 13.0–17.0)
MCH: 30.3 pg (ref 26.0–34.0)
MCHC: 34.5 g/dL (ref 30.0–36.0)
MCV: 87.9 fL (ref 80.0–100.0)
Platelets: 293 10*3/uL (ref 150–400)
RBC: 4.62 MIL/uL (ref 4.22–5.81)
RDW: 12 % (ref 11.5–15.5)
WBC: 5.6 10*3/uL (ref 4.0–10.5)
nRBC: 0 % (ref 0.0–0.2)

## 2020-04-16 LAB — COMPREHENSIVE METABOLIC PANEL
ALT: 11 U/L (ref 0–44)
AST: 18 U/L (ref 15–41)
Albumin: 5 g/dL (ref 3.5–5.0)
Alkaline Phosphatase: 78 U/L (ref 38–126)
Anion gap: 7 (ref 5–15)
BUN: 12 mg/dL (ref 6–20)
CO2: 25 mmol/L (ref 22–32)
Calcium: 9.2 mg/dL (ref 8.9–10.3)
Chloride: 105 mmol/L (ref 98–111)
Creatinine, Ser: 1.09 mg/dL (ref 0.61–1.24)
GFR calc Af Amer: >60 mL/min (ref >60)
GFR calc non Af Amer: >60 mL/min (ref >60)
Glucose, Bld: 92 mg/dL (ref 70–99)
Potassium: 3.8 mmol/L (ref 3.5–5.1)
Sodium: 137 mmol/L (ref 135–145)
Total Bilirubin: 1 mg/dL (ref 0.3–1.2)
Total Protein: 7.8 g/dL (ref 6.5–8.1)

## 2020-04-16 LAB — URINE DRUG SCREEN, QUALITATIVE (ARMC ONLY)
Amphetamines, Ur Screen: NOT DETECTED
Barbiturates, Ur Screen: NOT DETECTED
Benzodiazepine, Ur Scrn: NOT DETECTED
Cannabinoid 50 Ng, Ur ~~LOC~~: NOT DETECTED
Cocaine Metabolite,Ur ~~LOC~~: NOT DETECTED
MDMA (Ecstasy)Ur Screen: NOT DETECTED
Methadone Scn, Ur: NOT DETECTED
Opiate, Ur Screen: NOT DETECTED
Phencyclidine (PCP) Ur S: NOT DETECTED
Tricyclic, Ur Screen: NOT DETECTED

## 2020-04-16 LAB — ETHANOL: Alcohol, Ethyl (B): <10 mg/dL (ref <10)

## 2020-04-16 LAB — SALICYLATE LEVEL: Salicylate Lvl: <7 mg/dL — ABNORMAL LOW (ref 7.0–30.0)

## 2020-04-16 LAB — ACETAMINOPHEN LEVEL: Acetaminophen (Tylenol), Serum: <10 ug/mL — ABNORMAL LOW (ref 10–30)

## 2020-04-16 LAB — SARS CORONAVIRUS 2 BY RT PCR (HOSPITAL ORDER, PERFORMED IN ~~LOC~~ HOSPITAL LAB): SARS Coronavirus 2: NEGATIVE

## 2020-04-16 MED ORDER — HALOPERIDOL 0.5 MG PO TABS
0.5000 mg | ORAL_TABLET | Freq: Two times a day (BID) | ORAL | Status: DC
  Administered 2020-04-16 – 2020-04-17 (×3): 0.5 mg via ORAL
  Filled 2020-04-16 (×3): qty 1

## 2020-04-16 MED ORDER — FLUOXETINE HCL 20 MG PO CAPS
20.0000 mg | ORAL_CAPSULE | Freq: Every day | ORAL | Status: DC
  Administered 2020-04-16 – 2020-04-17 (×2): 20 mg via ORAL
  Filled 2020-04-16 (×2): qty 1

## 2020-04-16 MED ORDER — HYDROXYZINE HCL 25 MG PO TABS
25.0000 mg | ORAL_TABLET | Freq: Three times a day (TID) | ORAL | Status: DC | PRN
  Administered 2020-04-16: 25 mg via ORAL
  Filled 2020-04-16: qty 1

## 2020-04-16 MED ORDER — TRAZODONE HCL 50 MG PO TABS
50.0000 mg | ORAL_TABLET | Freq: Every evening | ORAL | Status: DC | PRN
  Administered 2020-04-16: 50 mg via ORAL
  Filled 2020-04-16: qty 1

## 2020-04-16 MED ORDER — BENZTROPINE MESYLATE 1 MG PO TABS
0.5000 mg | ORAL_TABLET | Freq: Two times a day (BID) | ORAL | Status: DC
  Administered 2020-04-16 – 2020-04-17 (×3): 0.5 mg via ORAL
  Filled 2020-04-16 (×3): qty 1

## 2020-04-16 MED ORDER — OLANZAPINE 10 MG PO TABS
20.0000 mg | ORAL_TABLET | Freq: Every day | ORAL | Status: DC
  Administered 2020-04-16 – 2020-04-17 (×2): 20 mg via ORAL
  Filled 2020-04-16 (×2): qty 2
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
## 2020-04-16 NOTE — ED Notes (Signed)
Pt denies SI/HI/AVH on assessment 

## 2020-04-16 NOTE — ED Notes (Signed)
Pt provided with lunch tray.

## 2020-04-16 NOTE — ED Notes (Signed)
IVC, pend psych consult 
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
## 2020-04-16 NOTE — ED Notes (Signed)
Psychiatry is currently at his bedside  - EDP order to move pt to ED BHU  Report given to [facility] pending Covid swab

## 2020-04-16 NOTE — ED Triage Notes (Signed)
Pt here under IVC.  Ar[place] with [place].  Pt denies SI/HI.  IVC papers report that patient has been using drugs and pacing as well as talking to people not there.  Pt denies any of this as well as drug use; reports last drug use was 2 months ago.  Pt wants a nose test for drugs because "when I was walking down the highway I smelled illicit crack cocaine and I don't want it to show me as positive because I smelled it".  Pt reports never been here but was admitted to psych in march this year.  Pt does not appear to be forth coming with answers to questions asked in triage.   Dressed out with Psychologist, forensic in room and this Public house manager NT  1 pair flip flops 1 long sleeve shirt 1 pair shrots 1 pair socks
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
## 2020-04-16 NOTE — ED Provider Notes (Signed)
-----------------------------------------   11:13 PM on 04/16/2020 -----------------------------------------  Patient moved to ED BHU.  The patient has been placed in psychiatric observation due to the need to provide a safe environment for the patient while obtaining psychiatric consultation and evaluation, as well as ongoing medical and medication management to treat the patient's condition.  The patient has been placed under full IVC at this time.    Loleta Rose, MD 04/16/20 [id number]

## 2020-04-17 ENCOUNTER — Inpatient Hospital Stay
Admission: AD | Admit: 2020-04-17 | Payer: No Typology Code available for payment source | Source: Intra-hospital | Admitting: Psychiatry

## 2020-04-17 NOTE — ED Notes (Signed)
Assessment completed  He denies pain  Medications to be administered as ordered  He refused to allow me to take his Vital signs

## 2020-04-17 NOTE — Final Progress Note (Signed)
Physician Final Progress Note  Patient ID: Eric Pineda MRN: [id number] DOB/AGE: 1995/10/28 24 y.o.  Admit date: 04/16/2020 Admitting provider: No admitting provider for patient encounter. Discharge date: 04/17/2020   Admission Diagnoses:  Psychosis NOS   Discharge Diagnoses:  Psychosis NOS  Most likely Schizoaffective disorder vs Bipolar mixed with Psychosis  Active Problems:   * No active hospital problems. *  Remains vague and psychotic needing involuntary admission to [facility]   We tried to call family but no answer for further info  History of recent severe mood swings, ups and downs, psychosis IED and anger at [facility] not feeling safe and patient is too ill  Not clear on compliance   Consults:   ER MD TTS [facility] MD  Significant Findings/ Diagnostic Studies:  URINE drug screen negative  Procedures:  None   Discharge Condition:  Fair guarded   Disposition:  transferred to [facility] inpatient   Diet: as tolerated   Discharge Activity:  per unit with checks  Remains on home meds with addition of low dose bid haldol    Alert, vague not cooperative, withholding, paranoid fearful suspicious Many " IDK answers and " I am good all good defensive answers "-- Unclear safety margin --illogical not making sense  Consciousness not clouded or fluctuant Concentration and attention poor  Mood depressed and anxious        Total time spent taking care of this patient: 20-25  minutes  Signed: Roselind Messier 04/17/2020, 1:18 PM

## 2020-04-17 NOTE — BH Assessment (Signed)
Referral information for Psychiatric Hospitalization faxed to;   . [facility] ([phone]-or- [phone]),   . [facility] ([phone] or [phone])  . [facility] ([phone]),   . [facility] ([phone] -or- [phone]),   . [facility] ([phone]).   

## 2020-04-17 NOTE — BH Assessment (Signed)
Writer spoke with the patient to complete an updated/reassessment. Patient was having thought blocking and difficult to engage. His responses were; "I don't know", "I don't know what to tell you" or "I'm good."  Writer made multiple attempts to contact the patient's sister [name] [phone]) for collateral information. However, he was unable to reach her and unable to leave voicemail message, to request a return phone call.

## 2020-04-17 NOTE — ED Notes (Signed)
Snacks given 

## 2020-04-17 NOTE — ED Provider Notes (Signed)
Emergency Medicine Observation Re-evaluation Note  Rylin Seavey is a 24 y.o. male, seen on rounds today.  Pt initially presented to the ED for complaints of Psychiatric Evaluation Currently, the patient is calm, resting comfortably.  Physical Exam  BP 123/77 (BP Location: Left Arm)   Pulse 77   Temp 97.8 F (36.6 C)   Resp 18   Ht 5\' 10"  (1.778 m)   Wt 81.6 kg   SpO2 94%   BMI 25.83 kg/m  Physical Exam   GEN: No acute distress, resting comfortably HEENT: NCAT Neck: Supple CV: Regular rate and rhythm Lungs: Normal WOB, no resp distress Neuro: AOx3, no focal deficits Psych: Calm, resting   ED Course / MDM  EKG:    I have reviewed the labs performed to date as well as medications administered while in observation.  Recent changes in the last 24 hours include none, labs reviewed and are unremarkable.  Plan  Current plan is for psych eval and disposition. Patient is under full IVC at this time.   , MD 04/17/20 1106

## 2020-04-17 NOTE — ED Notes (Signed)

## 2020-04-18 NOTE — ED Notes (Signed)
Assessment completed  He denie [place]   Plan of care discussed including his pending transfer to [facility] for inpatient treatment  He is IVC and he does verbalize agreement with this plan

## 2020-04-18 NOTE — ED Provider Notes (Signed)
Emergency Medicine Observation Re-evaluation Note  Eric Pineda is a 24 y.o. male, seen on rounds today.  Pt initially presented to the ED for complaints of Psychiatric Evaluation Currently, the patient is sleeping.  Physical Exam  BP 126/72 (BP Location: Left Arm)   Pulse 78   Temp 97.8 F (36.6 C) (Oral)   Resp 18   Ht 1.778 m (5\' 10" )   Wt 81.6 kg   SpO2 98%   BMI 25.83 kg/m  Physical Exam  Gen:  No acute distress Resp:  Breathing easily and comfortably, no accessory muscle usage Neuro:  Moving all four extremities, no gross focal neuro deficits Psych:  Resting currently, calm and cooperative when awake  ED Course / MDM  EKG:    I have reviewed the labs performed to date as well as medications administered while in observation.  Recent changes in the last 24 hours include psych disposition. Plan  Current plan is for transfer to [facility] in the morning. Patient is under full IVC at this time.   [facility], MD 04/18/20 [phone]

## 2020-04-18 NOTE — BH Assessment (Signed)
PATIENT BED AVAILABLE AFTER 8AM   Patient has been accepted to [facility].  Patient assigned to [facility] Accepting physician is Dr. Estill Cotta.  Call report to [phone].  Representative was Pax.   ER Staff is aware of it:  Carlane ER Secretary  Dr. York Cerise, ER MD  Selena Batten Patient's Nurse     Address: [address] [place], [place] [address]

## 2020-04-18 NOTE — BH Assessment (Signed)
Referral information for Psychiatric Hospitalization faxed to;    [facility] ([phone]- [phone]), [facility] admissions staff reports that he has not had a chance to review referrals yet   [facility] ([phone] or [phone]) Reports no current beds available   [facility] [phone]),    [facility] [phone] -or- [phone]),    [facility] [phone]).No admissions staff currently available, voicemail was left

## 2020-10-04 ENCOUNTER — Emergency Department
Admission: EM | Admit: 2020-10-04 | Discharge: 2020-10-05 | Disposition: A | Discharge status: Home / Self Care | Payer: Medicaid Other | Attending: Emergency Medicine | Admitting: Emergency Medicine

## 2020-10-04 ENCOUNTER — Other Ambulatory Visit: Payer: Self-pay

## 2020-10-04 ENCOUNTER — Encounter: Payer: Self-pay | Admitting: Emergency Medicine

## 2020-10-04 DIAGNOSIS — Z20822 Contact with and (suspected) exposure to covid-19: Secondary | ICD-10-CM | POA: Insufficient documentation

## 2020-10-04 DIAGNOSIS — F203 Undifferentiated schizophrenia: Secondary | ICD-10-CM

## 2020-10-04 DIAGNOSIS — F23 Brief psychotic disorder: Secondary | ICD-10-CM | POA: Insufficient documentation

## 2020-10-04 DIAGNOSIS — F209 Schizophrenia, unspecified: Secondary | ICD-10-CM

## 2020-10-04 LAB — COMPREHENSIVE METABOLIC PANEL
ALT: 14 U/L (ref 0–44)
AST: 18 U/L (ref 15–41)
Albumin: 4.8 g/dL (ref 3.5–5.0)
Alkaline Phosphatase: 68 U/L (ref 38–126)
Anion gap: 13 (ref 5–15)
BUN: 17 mg/dL (ref 6–20)
CO2: 24 mmol/L (ref 22–32)
Calcium: 9.8 mg/dL (ref 8.9–10.3)
Chloride: 102 mmol/L (ref 98–111)
Creatinine, Ser: 1.09 mg/dL (ref 0.61–1.24)
GFR, Estimated: >60 mL/min (ref >60)
Glucose, Bld: 111 mg/dL — ABNORMAL HIGH (ref 70–99)
Potassium: 3.9 mmol/L (ref 3.5–5.1)
Sodium: 139 mmol/L (ref 135–145)
Total Bilirubin: 0.7 mg/dL (ref 0.3–1.2)
Total Protein: 7.7 g/dL (ref 6.5–8.1)

## 2020-10-04 LAB — LIPID PANEL
Cholesterol: 171 mg/dL (ref 0–200)
HDL: 55 mg/dL (ref >40)
LDL Cholesterol: 104 mg/dL — ABNORMAL HIGH (ref 0–99)
Total CHOL/HDL Ratio: 3.1 RATIO
Triglycerides: 61 mg/dL (ref <150)
VLDL: 12 mg/dL (ref 0–40)

## 2020-10-04 LAB — ACETAMINOPHEN LEVEL: Acetaminophen (Tylenol), Serum: <10 ug/mL — ABNORMAL LOW (ref 10–30)

## 2020-10-04 LAB — CBC
HCT: 42.3 % (ref 39.0–52.0)
Hemoglobin: 14.8 g/dL (ref 13.0–17.0)
MCH: 30.3 pg (ref 26.0–34.0)
MCHC: 35 g/dL (ref 30.0–36.0)
MCV: 86.5 fL (ref 80.0–100.0)
Platelets: 337 10*3/uL (ref 150–400)
RBC: 4.89 MIL/uL (ref 4.22–5.81)
RDW: 12.3 % (ref 11.5–15.5)
WBC: 8.5 10*3/uL (ref 4.0–10.5)
nRBC: 0 % (ref 0.0–0.2)

## 2020-10-04 LAB — RESP PANEL BY RT-PCR (FLU A&B, COVID) ARPGX2
Influenza A by PCR: NEGATIVE
Influenza B by PCR: NEGATIVE
SARS Coronavirus 2 by RT PCR: NEGATIVE

## 2020-10-04 LAB — SALICYLATE LEVEL: Salicylate Lvl: <7 mg/dL — ABNORMAL LOW (ref 7.0–30.0)

## 2020-10-04 LAB — ETHANOL: Alcohol, Ethyl (B): <10 mg/dL (ref <10)

## 2020-10-04 MED ORDER — OLANZAPINE 5 MG PO TBDP
15.0000 mg | ORAL_TABLET | Freq: Every day | ORAL | Status: DC
  Administered 2020-10-04: 15 mg via ORAL
  Filled 2020-10-04: qty 3

## 2020-10-04 NOTE — ED Triage Notes (Signed)
Pt belongings: Sandals, blue shirt, grasy shorts

## 2020-10-04 NOTE — ED Triage Notes (Signed)
Pt in via [facility] under IVC. Pt not compliant with meds, not sleeping and altered.

## 2020-10-04 NOTE — BH Assessment (Signed)
Comprehensive Clinical Assessment (CCA) Note  10/04/2020 Eric Pineda [id number]  Chief Complaint:  [name] arrived to the ED under IVC.  [name] appeared disoriented.  He was unable to answer basic questions about himself or even identify how he came to the Emergency room.  [name] denied symptoms of depression or anxiety.  He denied suicidal ideation or intent. He denied homicidal ideation or intent.  He denied having auditory or visual hallucinations.  He denied the use of drugs or alcohol.  [name] was unable to recall information during the session, even information that was recently provided.  It appeared that [name] had a difficult time following the flow of the conversation and needed questions repeated multiple times, before he would answer "no" or "I don't know". IVC paperwork reported that [name] is not taking his prescribed medications.  He has been to the hospital twice in recent times. IVC further reports that [name] has been talking to himself as well as, he has not bee sleeping or eating.    TTS attempted to contact mother Eric Pineda- [phone]) for collateral information.  No one answered and the calls go to a voicemail that has not been set up at this time. Mr.Eric Pineda denied substance use.  UDS has not been completed at this time.  Chief Complaint  Patient presents with  . Mental Health Problem   Visit Diagnosis: Schizophrenia  CCA Screening, Triage and Referral (STR)  Patient Reported Information How did you hear about [place]? Self  Referral name: No data recorded Referral phone number: [phone] (patient provided multiple different numbers.  He could not reemember)   Whom do you see for routine medical problems? Primary Care  Practice/Facility Name: Unknown  Practice/Facility Phone Number: No data recorded Name of Contact: No data recorded Contact Number: No data recorded Contact Fax Number: No data recorded Prescriber Name: No data recorded Prescriber  Address (if known): No data recorded  What Is the Reason for Your Visit/Call Today? Unknown  How Long Has This Been Causing You Problems? No data recorded What Do You Feel Would Help You the Most Today? No data recorded  Have You Recently Been in Any Inpatient Treatment (Hospital/Detox/Crisis Center/28-Day Program)? No data recorded Name/Location of Program/Hospital:No data recorded How Long Were You There? No data recorded When Were You Discharged? No data recorded  Have You Ever Received Services From [facility] Before? No data recorded Who Do You See at [facility]? No data recorded  Have You Recently Had Any Thoughts About Hurting Yourself? No  Are You Planning to Commit Suicide/Harm Yourself At This time? No   Have you Recently Had Thoughts About Hurting Someone Eric Pineda? No  Explanation: No data recorded  Have You Used Any Alcohol or Drugs in the Past 24 Hours? No  How Long Ago Did You Use Drugs or Alcohol? No data recorded What Did You Use and How Much? No data recorded  Do You Currently Have a Therapist/Psychiatrist? No  Name of Therapist/Psychiatrist: No data recorded  Have You Been Recently Discharged From Any Office Practice or Programs? No  Explanation of Discharge From Practice/Program: No data recorded    CCA Screening Triage Referral Assessment Type of Contact: Face-to-Face  Is this Initial or Reassessment? No data recorded Date Telepsych consult ordered in [facility]:  04/16/2020  Time Telepsych consult ordered in [facility]:  No data recorded  Patient Reported Information Reviewed? No data recorded Patient Left Without Being Seen? No data recorded Reason for Not Completing  Assessment: No data recorded  Collateral Involvement: No data recorded  Does Patient Have a Court Appointed Legal Guardian? No data recorded Name and Contact of Legal Guardian: self  If Minor and Not Living with Parent(s), Who has Custody? n/a  Is CPS involved or ever been involved?  Never  Is APS involved or ever been involved? Never   Patient Determined To Be At Risk for Harm To Self or Others Based on Review of Patient Reported Information or Presenting Complaint? No data recorded Method: No data recorded Availability of Means: No data recorded Intent: No data recorded Notification Required: No data recorded Additional Information for Danger to Others Potential: No data recorded Additional Comments for Danger to Others Potential: No data recorded Are There Guns or Other Weapons in Your Home? No  Types of Guns/Weapons: No data recorded Are These Weapons Safely Secured?                            No data recorded Who Could Verify You Are Able To Have These Secured: No data recorded Do You Have any Outstanding Charges, Pending Court Dates, Parole/Probation? No data recorded Contacted To Inform of Risk of Harm To Self or Others: No data recorded  Location of Assessment: [facility] ED   Does Patient Present under Involuntary Commitment? No  IVC Papers Initial File Date: No data recorded  [place] of Residence: No data recorded  Patient Currently Receiving the Following Services: No data recorded  Determination of Need: No data recorded  Options For Referral: No data recorded    CCA Biopsychosocial Intake/Chief Complaint:  No data recorded Current Symptoms/Problems: No data recorded  Patient Reported Schizophrenia/Schizoaffective Diagnosis in Past: No data recorded  Strengths: No data recorded Preferences: No data recorded Abilities: No data recorded  Type of Services Patient Feels are Needed: No data recorded  Initial Clinical Notes/Concerns: No data recorded  Mental Health Symptoms Depression:  No data recorded  Duration of Depressive symptoms: No data recorded  Mania:  No data recorded  Anxiety:   No data recorded  Psychosis:  No data recorded  Duration of Psychotic symptoms: No data recorded  Trauma:  No data recorded  Obsessions:  No data  recorded  Compulsions:  No data recorded  Inattention:  No data recorded  Hyperactivity/Impulsivity:  No data recorded  Oppositional/Defiant Behaviors:  No data recorded  Emotional Irregularity:  No data recorded  Other Mood/Personality Symptoms:  No data recorded   Mental Status Exam Appearance and self-care  Stature:  No data recorded  Weight:  No data recorded  Clothing:  No data recorded  Grooming:  No data recorded  Cosmetic use:  No data recorded  Posture/gait:  No data recorded  Motor activity:  No data recorded  Sensorium  Attention:  No data recorded  Concentration:  No data recorded  Orientation:  No data recorded  Recall/memory:  No data recorded  Affect and Mood  Affect:  No data recorded  Mood:  No data recorded  Relating  Eye contact:  No data recorded  Facial expression:  No data recorded  Attitude toward examiner:  No data recorded  Thought and Language  Speech flow: No data recorded  Thought content:  No data recorded  Preoccupation:  No data recorded  Hallucinations:  No data recorded  Organization:  No data recorded  [employer] of Knowledge:  No data recorded  Intelligence:  No data recorded  Abstraction:  No data recorded  Judgement:  No data recorded  Reality Testing:  No data recorded  Insight:  No data recorded  Decision Making:  No data recorded  Social Functioning  Social Maturity:  No data recorded  Social Judgement:  No data recorded  Stress  Stressors:  No data recorded  Coping Ability:  No data recorded  Skill Deficits:  No data recorded  Supports:  No data recorded    Religion:    Leisure/Recreation:    Exercise/Diet:     CCA Employment/Education Employment/Work Situation: Employment / Work Situation Employment situation: Unemployed  Education: Education Did Garment/textile technologist From [employer]?: No   CCA Family/Childhood History Family and Relationship History:    Childhood History:      Child/Adolescent Assessment:     CCA Substance Use Alcohol/Drug Use:                           ASAM's:  Six Dimensions of Multidimensional Assessment  Dimension 1:  Acute Intoxication and/or Withdrawal Potential:      Dimension 2:  Biomedical Conditions and Complications:      Dimension 3:  Emotional, Behavioral, or Cognitive Conditions and Complications:     Dimension 4:  Readiness to Change:     Dimension 5:  Relapse, Continued use, or Continued Problem Potential:     Dimension 6:  Recovery/Living Environment:     ASAM Severity Score:    ASAM Recommended Level of Treatment:     Substance use Disorder (SUD)    Recommendations for Services/Supports/Treatments:    DSM5 Diagnoses: Patient Active Problem List   Diagnosis Date Noted  . Schizophrenia (HCC) 10/04/2020  . 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 Bizarre behavior 12/02/2019  . Involuntary commitment      Justice Deeds, Counselor

## 2020-10-04 NOTE — ED Provider Notes (Signed)
[facility] Emergency Department Provider Note  ____________________________________________  Time seen: Approximately 5:49 PM  I have reviewed the triage vital signs and the nursing notes.   HISTORY  Chief Complaint Mental Health Problem    Level 5 Caveat: Portions of the History and Physical including HPI and review of systems are unable to be completely obtained due to patient being a poor historian   HPI Eric Pineda is a 25 y.o. male with a history of schizophrenia who is brought to the ED under involuntary commitment initiated by his mother due to bizarre and disorganized behavior.  She reported that he has been talking to himself at home, carrying on a conversation with another person who is not actually there, shaking his hands as if they are wet, not sleeping, not eating.  He is supposed to be on medication for schizophrenia, but she reports he has not been taking it.  Also has a history of substance abuse.  Patient himself denies any complaints states that he feels fine.     Past Medical History:  Diagnosis Date  . Bizarre behavior 12/02/2019     Patient Active Problem List   Diagnosis Date Noted  . Schizophrenia (HCC) 10/04/2020  . 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 Bizarre behavior 12/02/2019  . Involuntary commitment      History reviewed. No pertinent surgical history.   Prior to Admission medications   Medication Sig Start Date End Date Taking? Authorizing Provider  FLUoxetine (PROZAC) 20 MG capsule Take 1 capsule (20 mg total) by mouth daily. 12/19/19   Money, Gerlene Burdock, FNP  hydrOXYzine (ATARAX/VISTARIL) 25 MG tablet Take 1 tablet (25 mg total) by mouth 3 (three) times daily as needed for anxiety. 12/18/19   Money, Gerlene Burdock, FNP  OLANZapine zydis (ZYPREXA) 20 MG disintegrating tablet Take 1 tablet (20 mg total) by mouth at bedtime. 12/18/19   Money, Gerlene Burdock, FNP  traZODone (DESYREL) 50 MG tablet Take 1 tablet (50 mg  total) by mouth at bedtime as needed for sleep. 12/18/19   Money, Gerlene Burdock, FNP     Allergies Patient has no known allergies.   No family history on file.  Social History Social History   Tobacco Use  . Smoking status: Never Smoker  . Smokeless tobacco: Never Used    Review of Systems Level 5 Caveat: Portions of the History and Physical including HPI and review of systems are unable to be completely obtained due to patient being a poor historian   Constitutional:   No known fever.  ENT:   No rhinorrhea. Cardiovascular:   No chest pain or syncope. Respiratory:   No dyspnea or cough. Gastrointestinal:   Negative for abdominal pain, vomiting and diarrhea.  Musculoskeletal:   Negative for focal pain or swelling ____________________________________________   PHYSICAL EXAM:  VITAL SIGNS: ED Triage Vitals  Enc Vitals Group     BP 10/04/20 1539 136/83     Pulse Rate 10/04/20 1539 97     Resp 10/04/20 1539 20     Temp 10/04/20 1539 98.2 F (36.8 C)     Temp Source 10/04/20 1539 Oral     SpO2 10/04/20 1539 99 %     Weight 10/04/20 1537 175 lb (79.4 kg)     Height 10/04/20 1537 5\' 11"  (1.803 m)     Head Circumference --      Peak Flow --      Pain Score 10/04/20 1537 0     Pain Loc --  Pain Edu? --      Excl. in GC? --     Vital signs reviewed, nursing assessments reviewed.   Constitutional:   Alert and oriented. Non-toxic appearance. Eyes:   Conjunctivae are normal. EOMI. PERRL. ENT      Head:   Normocephalic and atraumatic.      Nose:   No congestion/rhinnorhea.       Mouth/Throat:   MMM, no pharyngeal erythema. No peritonsillar mass.       Neck:   No meningismus. Full ROM. Hematological/Lymphatic/Immunilogical:   No cervical lymphadenopathy. Cardiovascular:   RRR. Symmetric bilateral radial and DP pulses.  No murmurs. Cap refill less than 2 seconds. Respiratory:   Normal respiratory effort without tachypnea/retractions. Breath sounds are clear and equal  bilaterally. No wheezes/rales/rhonchi. Gastrointestinal:   Soft and nontender. Non distended. There is no CVA tenderness.  No rebound, rigidity, or guarding.  Musculoskeletal:   Normal range of motion in all extremities. No joint effusions.  No lower extremity tenderness.  No edema. Neurologic:   Normal speech and language.  Disorganized, auditory hallucinations. Motor grossly intact. Skin:    Skin is warm, dry and intact. No rash noted.  No petechiae, purpura, or bullae.  ____________________________________________    LABS (pertinent positives/negatives) (all labs ordered are listed, but only abnormal results are displayed) Labs Reviewed  COMPREHENSIVE METABOLIC PANEL - Abnormal; Notable for the following components:      Result Value   Glucose, Bld 111 (*)    All other components within normal limits  SALICYLATE LEVEL - Abnormal; Notable for the following components:   Salicylate Lvl <7.0 (*)    All other components within normal limits  ACETAMINOPHEN LEVEL - Abnormal; Notable for the following components:   Acetaminophen (Tylenol), Serum <10 (*)    All other components within normal limits  ETHANOL  CBC  URINE DRUG SCREEN, QUALITATIVE ([facility] ONLY)  LIPID PANEL   ____________________________________________   EKG    ____________________________________________    RADIOLOGY  No results found.  ____________________________________________   PROCEDURES Procedures  ____________________________________________    CLINICAL IMPRESSION / ASSESSMENT AND PLAN / ED COURSE  Medications ordered in the ED: Medications  OLANZapine zydis (ZYPREXA) disintegrating tablet 15 mg (has no administration in time range)    Pertinent labs & imaging results that were available during my care of the patient were reviewed by me and considered in my medical decision making (see chart for details).   Eric Pineda was evaluated in Emergency Department on 10/04/2020 for the symptoms  described in the history of present illness. He was evaluated in the context of the global COVID-19 pandemic, which necessitated consideration that the patient might be at risk for infection with the SARS-CoV-2 virus that causes COVID-19. Institutional protocols and algorithms that pertain to the evaluation of patients at risk for COVID-19 are in a state of rapid change based on information released by regulatory bodies including the CDC and federal and state organizations. These policies and algorithms were followed during the patient's care in the ED.   Patient presents with acute psychosis, decompensation of underlying schizophrenia related to medication noncompliance.  Vital signs are normal, no acute medical complaints.  Exam reassuring.  Continue IVC pending psychiatry evaluation and recommendations.  The patient has been placed in psychiatric observation due to the need to provide a safe environment for the patient while obtaining psychiatric consultation and evaluation, as well as ongoing medical and medication management to treat the patient's condition.  The patient has been  placed under full IVC at this time.       ____________________________________________   FINAL CLINICAL IMPRESSION(S) / ED DIAGNOSES    Final diagnoses:  Acute psychosis [facility])     ED Discharge Orders    None      Portions of this note were generated with dragon dictation software. Dictation errors may occur despite best attempts at proofreading.   Sharman Cheek, MD 10/04/20 [phone]

## 2020-10-04 NOTE — Consult Note (Signed)
[facility] Face-to-Face Psychiatry Consult   Reason for Consult: Consult for 25 year old man brought in under IVC Referring Physician: Cyril Loosen [name] MRN:  [id number] Principal Diagnosis: Schizophrenia (HCC) Diagnosis:  Principal Problem:   Schizophrenia (HCC)   Total Time spent with patient: 1 hour  Subjective:   Eric Pineda is a 25 y.o. male patient admitted with "no".  HPI: Patient seen chart reviewed.  Patient familiar to me from a previous hospitalization.  24 year old man brought in under IVC filed by his mother.  IVC paperwork emphasizes reporting psychotic behavior.  Patient is withdrawn at home not interacting appropriately with family frequently displaying bizarre behavior family feels he is not taking care of his health well.  There is no specific report of violence or suicidal behavior.  On interview the patient appears adequately groomed and in normal health.  He does not cooperate or participate in the interview.  Barely makes any eye contact.  Answers almost all questions by saying "no".  Will not answer any open-ended questions.  Ends the interview after a couple minutes stating that he wants to sleep but then lies down flat on his back staring at the ceiling.  Not violent not agitated.  Past Psychiatric History: Patient has a history of schizophrenia like symptoms that seem to have started about a year ago.  Has had 2 hospitalizations in the past year 1 in March and 1 in July.  Showed some response to olanzapine during his first hospitalization.  Unknown what medication he was on for the second 1 as he was referred to another facility.  No known history of suicidal or violent behavior.  No known substance abuse history  Risk to Self:   Risk to Others:   Prior Inpatient Therapy:   Prior Outpatient Therapy:    Past Medical History:  Past Medical History:  Diagnosis Date  . Bizarre behavior 12/02/2019   History reviewed. No pertinent surgical  history. Family History: No family history on file. Family Psychiatric  History: None reported Social History:  Social History   Substance and Sexual Activity  Alcohol Use None     Social History   Substance and Sexual Activity  Drug Use Not on file    Social History   Socioeconomic History  . Marital status: Single    Spouse name: Not on file  . Number of children: Not on file  . Years of education: Not on file  . Highest education level: Not on file  Occupational History  . Not on file  Tobacco Use  . Smoking status: Never Smoker  . Smokeless tobacco: Never Used  Substance and Sexual Activity  . Alcohol use: Not on file  . Drug use: Not on file  . Sexual activity: Not on file  Other Topics Concern  . Not on file  Social History Narrative  . Not on file   Social Determinants of Health   Financial Resource Strain: Not on file  Food Insecurity: Not on file  Transportation Needs: Not on file  Physical Activity: Not on file  Stress: Not on file  Social Connections: Not on file   Additional Social History:    Allergies:  No Known Allergies  Labs:  Results for orders placed or performed during the hospital encounter of 10/04/20 (from the past 48 hour(s))  Comprehensive metabolic panel     Status: Abnormal   Collection Time: 10/04/20  3:39 PM  Result Value Ref Range   Sodium 139 135 - 145 mmol/L  Potassium 3.9 3.5 - 5.1 mmol/L   Chloride 102 98 - 111 mmol/L   CO2 24 22 - 32 mmol/L   Glucose, Bld 111 (H) 70 - 99 mg/dL    Comment: Glucose reference range applies only to samples taken after fasting for at least 8 hours.   BUN 17 6 - 20 mg/dL   Creatinine, Ser 1.61 0.61 - 1.24 mg/dL   Calcium 9.8 8.9 - 09.6 mg/dL   Total Protein 7.7 6.5 - 8.1 g/dL   Albumin 4.8 3.5 - 5.0 g/dL   AST 18 15 - 41 U/L   ALT 14 0 - 44 U/L   Alkaline Phosphatase 68 38 - 126 U/L   Total Bilirubin 0.7 0.3 - 1.2 mg/dL   GFR, Estimated >04 >54 mL/min    Comment: (NOTE) Calculated  using the CKD-EPI Creatinine Equation (2021)    Anion gap 13 5 - 15    Comment: Performed at [facility], [address], [place], [place] [address]  Ethanol     Status: None   Collection Time: 10/04/20  3:39 PM  Result Value Ref Range   Alcohol, Ethyl (B) <10 <10 mg/dL    Comment: (NOTE) Lowest detectable limit for serum alcohol is 10 mg/dL.  For medical purposes only. Performed at [facility], [address] [place], [place] [address]   Salicylate level     Status: Abnormal   Collection Time: 10/04/20  3:39 PM  Result Value Ref Range   Salicylate Lvl <7.0 (L) 7.0 - 30.0 mg/dL    Comment: Performed at [facility], [address] [place], [place] [address]  Acetaminophen level     Status: Abnormal   Collection Time: 10/04/20  3:39 PM  Result Value Ref Range   Acetaminophen (Tylenol), Serum <10 (L) 10 - 30 ug/mL    Comment: (NOTE) Therapeutic concentrations vary significantly. A range of 10-30 ug/mL  may be an effective concentration for many patients. However, some  are best treated at concentrations outside of this range. Acetaminophen concentrations >150 ug/mL at 4 hours after ingestion  and >50 ug/mL at 12 hours after ingestion are often associated with  toxic reactions.  Performed at [facility], [address] [place], [place] [address]   cbc     Status: None   Collection Time: 10/04/20  3:39 PM  Result Value Ref Range   WBC 8.5 4.0 - 10.5 K/uL   RBC 4.89 4.22 - 5.81 MIL/uL   Hemoglobin 14.8 13.0 - 17.0 g/dL   HCT 57.8 46.9 - 62.9 %   MCV 86.5 80.0 - 100.0 fL   MCH 30.3 26.0 - 34.0 pg   MCHC 35.0 30.0 - 36.0 g/dL   RDW 52.8 41.3 - 24.4 %   Platelets 337 150 - 400 K/uL   nRBC 0.0 0.0 - 0.2 %    Comment: Performed at [facility], [address] [place], [place] [address]    No current facility-administered medications for this encounter.   Current Outpatient Medications  Medication Sig Dispense Refill   . FLUoxetine (PROZAC) 20 MG capsule Take 1 capsule (20 mg total) by mouth daily. 7 capsule 0  . hydrOXYzine (ATARAX/VISTARIL) 25 MG tablet Take 1 tablet (25 mg total) by mouth 3 (three) times daily as needed for anxiety. 21 tablet 0  . OLANZapine zydis (ZYPREXA) 20 MG disintegrating tablet Take 1 tablet (20 mg total) by mouth at bedtime. 7 tablet 0  . traZODone (DESYREL) 50 MG tablet  Take 1 tablet (50 mg total) by mouth at bedtime as needed for sleep. 7 tablet 0    Musculoskeletal: Strength & Muscle Tone: within normal limits Gait & Station: normal Patient leans: N/A  Psychiatric Specialty Exam: Physical Exam Vitals and nursing note reviewed.  Constitutional:      Appearance: He is well-developed and well-nourished.  HENT:     Head: Normocephalic and atraumatic.  Eyes:     Conjunctiva/sclera: Conjunctivae normal.     Pupils: Pupils are equal, round, and reactive to light.  Cardiovascular:     Heart sounds: Normal heart sounds.  Pulmonary:     Effort: Pulmonary effort is normal.  Abdominal:     Palpations: Abdomen is soft.  Musculoskeletal:        General: Normal range of motion.     Cervical back: Normal range of motion.  Skin:    General: Skin is warm and dry.  Neurological:     General: No focal deficit present.     Mental Status: He is alert.  Psychiatric:        Attention and Perception: He is inattentive.        Mood and Affect: Affect is blunt.        Speech: He is noncommunicative.        Behavior: Behavior is withdrawn.        Cognition and Memory: Cognition is impaired.        Judgment: Judgment is inappropriate.     Review of Systems  Unable to perform ROS: Patient unresponsive  Skin: Negative.     Blood pressure 136/83, pulse 97, temperature 98.2 F (36.8 C), temperature source Oral, resp. rate 20, height 5\' 11"  (1.803 m), weight 79.4 kg, SpO2 99 %.Body mass index is 24.41 kg/m.  General Appearance: Casual  Eye Contact:  Minimal  Speech:  Slow   Volume:  Decreased  Mood:  Euthymic  Affect:  Flat  Thought Process:  Disorganized  Orientation:  Negative  Thought Content:  Negative  Suicidal Thoughts:  No  Homicidal Thoughts:  No  Memory:  Negative  Judgement:  Negative  Insight:  Negative  Psychomotor Activity:  Negative  Concentration:  Concentration: Negative  Recall:  Negative  Fund of Knowledge:  Negative  Language:  Negative  Akathisia:  Negative  Handed:  Right  AIMS (if indicated):     Assets:  Housing Physical Health  ADL's:  Impaired  Cognition:  Impaired,  Mild  Sleep:        Treatment Plan Summary: Plan 25 year old man who at this point I think can be safely diagnosed with schizophrenia.  Very flat and withdrawn.  Significant thought blocking.  Poor self-care.  Given severity of symptoms we will uphold the petition and plan for referral to psychiatric hospitals.  Labs still pending.  COVID test will be done.  I will go ahead and start olanzapine in the meantime which had been well-tolerated and beneficial in the past.  Disposition: Recommend psychiatric Inpatient admission when medically cleared.  22, MD 10/04/2020 5:11 PM

## 2020-10-05 ENCOUNTER — Inpatient Hospital Stay
Admission: AD | Admit: 2020-10-05 | Discharge: 2020-10-17 | DRG: 885 | Disposition: A | Discharge status: Home / Self Care | Payer: 59 | Source: Intra-hospital | Attending: Behavioral Health | Admitting: Behavioral Health

## 2020-10-05 DIAGNOSIS — F061 Catatonic disorder due to known physiological condition: Secondary | ICD-10-CM | POA: Diagnosis present

## 2020-10-05 DIAGNOSIS — F203 Undifferentiated schizophrenia: Secondary | ICD-10-CM

## 2020-10-05 DIAGNOSIS — Z9114 Patient's other noncompliance with medication regimen: Secondary | ICD-10-CM

## 2020-10-05 DIAGNOSIS — F202 Catatonic schizophrenia: Secondary | ICD-10-CM | POA: Diagnosis present

## 2020-10-05 DIAGNOSIS — F209 Schizophrenia, unspecified: Secondary | ICD-10-CM | POA: Diagnosis present

## 2020-10-05 MED ORDER — ALUM & MAG HYDROXIDE-SIMETH 200-200-20 MG/5ML PO SUSP: 30.0000 mL | ORAL | Status: DC | PRN

## 2020-10-05 MED ORDER — ACETAMINOPHEN 325 MG PO TABS: 650.0000 mg | ORAL_TABLET | Freq: Four times a day (QID) | ORAL | Status: DC | PRN

## 2020-10-05 MED ORDER — INFLUENZA VAC SPLIT QUAD 0.5 ML IM SUSY
0.5000 mL | PREFILLED_SYRINGE | INTRAMUSCULAR | Status: DC
  Filled 2020-10-05: qty 0.5

## 2020-10-05 MED ORDER — HYDROXYZINE HCL 25 MG PO TABS
25.0000 mg | ORAL_TABLET | Freq: Three times a day (TID) | ORAL | Status: DC | PRN
  Administered 2020-10-06: 25 mg via ORAL
  Filled 2020-10-05: qty 1

## 2020-10-05 MED ORDER — OLANZAPINE 5 MG PO TBDP: 10.0000 mg | ORAL_TABLET | Freq: Every day | ORAL | Status: DC

## 2020-10-05 MED ORDER — TRAZODONE HCL 50 MG PO TABS
50.0000 mg | ORAL_TABLET | Freq: Every evening | ORAL | Status: DC | PRN
  Administered 2020-10-08 – 2020-10-16 (×6): 50 mg via ORAL
  Filled 2020-10-05 (×6): qty 1

## 2020-10-05 MED ORDER — MAGNESIUM HYDROXIDE 400 MG/5ML PO SUSP: 30.0000 mL | Freq: Every day | ORAL | Status: DC | PRN

## 2020-10-05 MED ORDER — PALIPERIDONE ER 3 MG PO TB24
6.0000 mg | ORAL_TABLET | Freq: Every day | ORAL | Status: DC
  Administered 2020-10-05 – 2020-10-16 (×12): 6 mg via ORAL
  Filled 2020-10-05 (×12): qty 2

## 2020-10-05 NOTE — ED Notes (Signed)
Pt given breakfast tray and apple juice. Pt accepting.

## 2020-10-05 NOTE — BH Assessment (Signed)
Patient to be reviewed with [facility] in the morning 10/05/20 for potential admission. If not appropriate or no beds available patient to be referred to additional hospitals

## 2020-10-05 NOTE — H&P (Signed)
Psychiatric Admission Assessment Adult  [name] MRN:  [id number] Date of Evaluation:  10/05/2020 Chief Complaint:  Schizophrenia (HCC) [F20.9] Principal Diagnosis: <principal problem not specified> Diagnosis:  Active Problems:   Schizophrenia [facility])  [name] is a 25yo M with a history of Schizophrenia, who was admitted to [facility] unit due to exacerbation of psychosis in settings of non-compliance with psych medications.  History of Present Illness: Per chart, patient brought to ER due to psychotic behavior. He was reportedly "withdrawn at home, not interacting appropriately with family, frequently displaying bizarre behavior; family feels he is not taking care of his health well.  There is no specific report of violence or suicidal behavior."  In the unit today, patient is very-limitedly cooperative with the interview. He reports he was sent here by family because 'they decided to" (bring him). He admits he was feeling confused lately. Reports he was not taking any medications for "months". He denies any particular complaints, denies feeling depressed, anxious, suicidal, homicidal; denies any hallucinations. He cannot recall names of his last medications. He admitted that he was on Zyprexa in the past and also possibly [place], although states last medication he took in 2020 and repeats "twenty twenty" several times, looking at the ceiling. He agreed to restart oral Paliperidone tonight.  Collateral information obtained from patient`s mother over the phone, she confirmed that patient exhibits bizarre behavior for at least last four months and that during that time he was off psychotropic and any other medications and stopped visiting his outpatient psychiatrist. Mother cannot confirm patient past medication trials.   Past Psychiatric History:  Psych Dx: schizophrenia; started about a year ago.   Has had 2 hospitalizations in the past year: in March 2021 to [facility] and in July  2021 to [facility].   Pasty med trials: olanzapine during his first hospitalization.  Unknown what medication he was on for the second.   No known history of suicidal or violent behavior.  No known substance abuse history. He is not on any outpatient medications for last 4 months at least.   Total Time spent with patient: 30 minutes   Is the patient at risk to self? No.  Has the patient been a risk to self in the past 6 months? No.  Has the patient been a risk to self within the distant past? No.  Is the patient a risk to others? No.  Has the patient been a risk to others in the past 6 months? No.  Has the patient been a risk to others within the distant past? No.   Prior Inpatient Therapy:   Prior Outpatient Therapy:    Alcohol Screening: Patient refused Alcohol Screening Tool:  (UTA) 1. How often do you have a drink containing alcohol?: Never 2. How many drinks containing alcohol do you have on a typical day when you are drinking?: 1 or 2 3. How often do you have six or more drinks on one occasion?: Never AUDIT-C Score: 0 4. How often during the last year have you found that you were not able to stop drinking once you had started?: Never 5. How often during the last year have you failed to do what was normally expected from you because of drinking?: Never 6. How often during the last year have you needed a first drink in the morning to get yourself going after a heavy drinking session?: Never 7. How often during the last year have you had a feeling of guilt of remorse  after drinking?: Never 8. How often during the last year have you been unable to remember what happened the night before because you had been drinking?: Never 9. Have you or someone else been injured as a result of your drinking?: No 10. Has a relative or friend or a doctor or another health worker been concerned about your drinking or suggested you cut down?: No Alcohol Use Disorder Identification Test Final Score  (AUDIT): 0 Alcohol Brief Interventions/Follow-up: Patient Refused,Continued Monitoring Substance Abuse History in the last 12 months:  No. Consequences of Substance Abuse: NA Previous Psychotropic Medications: Yes  Psychological Evaluations: Yes  Past Medical History:  Past Medical History:  Diagnosis Date  . Bizarre behavior 12/02/2019   History reviewed. No pertinent surgical history. Family History: History reviewed. No pertinent family history. Family Psychiatric  History: unknown  Tobacco Screening:   Social History:  Social History   Substance and Sexual Activity  Alcohol Use None     Social History   Substance and Sexual Activity  Drug Use Not on file    Additional Social History:                           Allergies:  No Known Allergies Lab Results:  Results for orders placed or performed during the hospital encounter of 10/04/20 (from the past 48 hour(s))  Comprehensive metabolic panel     Status: Abnormal   Collection Time: 10/04/20  3:39 PM  Result Value Ref Range   Sodium 139 135 - 145 mmol/L   Potassium 3.9 3.5 - 5.1 mmol/L   Chloride 102 98 - 111 mmol/L   CO2 24 22 - 32 mmol/L   Glucose, Bld 111 (H) 70 - 99 mg/dL    Comment: Glucose reference range applies only to samples taken after fasting for at least 8 hours.   BUN 17 6 - 20 mg/dL   Creatinine, Ser 1.611.09 0.61 - 1.24 mg/dL   Calcium 9.8 8.9 - 09.610.3 mg/dL   Total Protein 7.7 6.5 - 8.1 g/dL   Albumin 4.8 3.5 - 5.0 g/dL   AST 18 15 - 41 U/L   ALT 14 0 - 44 U/L   Alkaline Phosphatase 68 38 - 126 U/L   Total Bilirubin 0.7 0.3 - 1.2 mg/dL   GFR, Estimated >04>60 >54>60 mL/min    Comment: (NOTE) Calculated using the CKD-EPI Creatinine Equation (2021)    Anion gap 13 5 - 15    Comment: Performed at [facility], [address] [place], [place] [address]  Ethanol     Status: None   Collection Time: 10/04/20  3:39 PM  Result Value Ref Range   Alcohol, Ethyl (B) <10 <10 mg/dL    Comment:  (NOTE) Lowest detectable limit for serum alcohol is 10 mg/dL.  For medical purposes only. Performed at [facility], [address] [place], [place] [address]   Salicylate level     Status: Abnormal   Collection Time: 10/04/20  3:39 PM  Result Value Ref Range   Salicylate Lvl <7.0 (L) 7.0 - 30.0 mg/dL    Comment: Performed at [facility], [address] [place], [place] [address]  Acetaminophen level     Status: Abnormal   Collection Time: 10/04/20  3:39 PM  Result Value Ref Range   Acetaminophen (Tylenol), Serum <10 (L) 10 - 30 ug/mL    Comment: (NOTE) Therapeutic concentrations vary significantly. A range of 10-30 ug/mL  may  be an effective concentration for many patients. However, some  are best treated at concentrations outside of this range. Acetaminophen concentrations >150 ug/mL at 4 hours after ingestion  and >50 ug/mL at 12 hours after ingestion are often associated with  toxic reactions.  Performed at [facility], [address] [place], [place] [address]   cbc     Status: None   Collection Time: 10/04/20  3:39 PM  Result Value Ref Range   WBC 8.5 4.0 - 10.5 K/uL   RBC 4.89 4.22 - 5.81 MIL/uL   Hemoglobin 14.8 13.0 - 17.0 g/dL   HCT 60.4 54.0 - 98.1 %   MCV 86.5 80.0 - 100.0 fL   MCH 30.3 26.0 - 34.0 pg   MCHC 35.0 30.0 - 36.0 g/dL   RDW 19.1 47.8 - 29.5 %   Platelets 337 150 - 400 K/uL   nRBC 0.0 0.0 - 0.2 %    Comment: Performed at [facility], [address] [place], [place] [address]  Lipid panel     Status: Abnormal   Collection Time: 10/04/20  3:39 PM  Result Value Ref Range   Cholesterol 171 0 - 200 mg/dL   Triglycerides 61 <865 mg/dL   HDL 55 >78 mg/dL   Total CHOL/HDL Ratio 3.1 RATIO   VLDL 12 0 - 40 mg/dL   LDL Cholesterol 469 (H) 0 - 99 mg/dL    Comment:        Total Cholesterol/HDL:CHD Risk Coronary Heart Disease Risk Table                     Men   Women  1/2 Average Risk   3.4   3.3  Average  Risk       5.0   4.4  2 X Average Risk   9.6   7.1  3 X Average Risk  23.4   11.0        Use the calculated Patient Ratio above and the CHD Risk Table to determine the patient's CHD Risk.        ATP III CLASSIFICATION (LDL):  <100     mg/dL   Optimal  629-528  mg/dL   Near or Above                    Optimal  130-159  mg/dL   Borderline  413-244  mg/dL   High  >010     mg/dL   Very High Performed at [facility], [address] [place], [place] [address]   Resp Panel by RT-PCR (Flu A&B, Covid) Nasopharyngeal Swab     Status: None   Collection Time: 10/04/20  6:35 PM   Specimen: Nasopharyngeal Swab; Nasopharyngeal(NP) swabs in vial transport medium  Result Value Ref Range   SARS Coronavirus 2 by RT PCR NEGATIVE NEGATIVE    Comment: (NOTE) SARS-CoV-2 target nucleic acids are NOT DETECTED.  The SARS-CoV-2 RNA is generally detectable in upper respiratory specimens during the acute phase of infection. The lowest concentration of SARS-CoV-2 viral copies this assay can detect is 138 copies/mL. A negative result does not preclude SARS-Cov-2 infection and should not be used as the sole basis for treatment or other patient management decisions. A negative result may occur with  improper specimen collection/handling, submission of specimen other than nasopharyngeal swab, presence of viral mutation(s) within the areas targeted by this assay, and inadequate number of viral copies(<138 copies/mL). A negative result must be combined  with clinical observations, patient history, and epidemiological information. The expected result is Negative.  Fact Sheet for Patients:  BloggerCourse.comhttps://www.fda.gov/media/152166/download  Fact Sheet for Healthcare Providers:  SeriousBroker.ithttps://www.fda.gov/media/152162/download  This test is no t yet approved or cleared by the [place] FDA and  has been authorized for detection and/or diagnosis of SARS-CoV-2 by FDA under an Emergency Use Authorization (EUA).  This EUA will remain  in effect (meaning this test can be used) for the duration of the COVID-19 declaration under Section 564(b)(1) of the Act, 21 U.S.C.section 360bbb-3(b)(1), unless the authorization is terminated  or revoked sooner.       Influenza A by PCR NEGATIVE NEGATIVE   Influenza B by PCR NEGATIVE NEGATIVE    Comment: (NOTE) The Xpert Xpress SARS-CoV-2/FLU/RSV plus assay is intended as an aid in the diagnosis of influenza from Nasopharyngeal swab specimens and should not be used as a sole basis for treatment. Nasal washings and aspirates are unacceptable for Xpert Xpress SARS-CoV-2/FLU/RSV testing.  Fact Sheet for Patients: BloggerCourse.comhttps://www.fda.gov/media/152166/download  Fact Sheet for Healthcare Providers: SeriousBroker.ithttps://www.fda.gov/media/152162/download  This test is not yet approved or cleared by the [place] FDA and has been authorized for detection and/or diagnosis of SARS-CoV-2 by FDA under an Emergency Use Authorization (EUA). This EUA will remain in effect (meaning this test can be used) for the duration of the COVID-19 declaration under Section 564(b)(1) of the Act, 21 U.S.C. section 360bbb-3(b)(1), unless the authorization is terminated or revoked.  Performed at [facility], [address] [place], [place] [address]     Blood Alcohol level:  Lab Results  Component Value Date   [facility] 10/04/2020   ETH <10 04/16/2020    Metabolic Disorder Labs:  Lab Results  Component Value Date   HGBA1C 5.1 12/03/2019   MPG 99.67 12/03/2019   No results found for: PROLACTIN Lab Results  Component Value Date   CHOL 171 10/04/2020   TRIG 61 10/04/2020   HDL 55 10/04/2020   CHOLHDL 3.1 10/04/2020   VLDL 12 10/04/2020   LDLCALC 104 (H) 10/04/2020    Current Medications: Current Facility-Administered Medications  Medication Dose Route Frequency Provider Last Rate Last Admin  . acetaminophen (TYLENOL) tablet 650 mg  650 mg Oral Q6H PRN Thalia PartyPaliy, Averee Harb,  MD      . alum & mag hydroxide-simeth (MAALOX/MYLANTA) 200-200-20 MG/5ML suspension 30 mL  30 mL Oral Q4H PRN Thalia PartyPaliy, Gertie Broerman, MD      . hydrOXYzine (ATARAX/VISTARIL) tablet 25 mg  25 mg Oral TID PRN Thalia PartyPaliy, Salil Raineri, MD      . magnesium hydroxide (MILK OF MAGNESIA) suspension 30 mL  30 mL Oral Daily PRN Jenica Costilow, MD      . paliperidone (INVEGA) 24 hr tablet 6 mg  6 mg Oral QHS Cathryn Gallery, MD      . traZODone (DESYREL) tablet 50 mg  50 mg Oral QHS PRN Thalia PartyPaliy, Travon Crochet, MD       PTA Medications: Medications Prior to Admission  Medication Sig Dispense Refill Last Dose  . FLUoxetine (PROZAC) 20 MG capsule Take 1 capsule (20 mg total) by mouth daily. (Patient not taking: Reported on 10/04/2020) 7 capsule 0   . hydrOXYzine (ATARAX/VISTARIL) 25 MG tablet Take 1 tablet (25 mg total) by mouth 3 (three) times daily as needed for anxiety. (Patient not taking: Reported on 10/04/2020) 21 tablet 0   . OLANZapine zydis (ZYPREXA) 20 MG disintegrating tablet Take 1 tablet (20 mg total) by mouth at bedtime. (Patient not taking: Reported on 10/04/2020)  7 tablet 0   . traZODone (DESYREL) 50 MG tablet Take 1 tablet (50 mg total) by mouth at bedtime as needed for sleep. (Patient not taking: Reported on 10/04/2020) 7 tablet 0     Musculoskeletal: Strength & Muscle Tone: within normal limits Gait & Station: normal Patient leans: N/A  Psychiatric Specialty Exam: Physical Exam Vitals and nursing note reviewed.  Constitutional:      General: He is not in acute distress.    Appearance: Normal appearance.  HENT:     Head: Normocephalic and atraumatic.  Cardiovascular:     Rate and Rhythm: Normal rate and regular rhythm.     Pulses: Normal pulses.  Pulmonary:     Effort: Pulmonary effort is normal. No respiratory distress.     Breath sounds: Normal breath sounds.  Abdominal:     General: Abdomen is flat.     Palpations: Abdomen is soft.  Skin:    General: Skin is warm and dry.  Neurological:     General: No focal  deficit present.     Mental Status: He is alert and oriented to person, place, and time.     Review of Systems  Constitutional: Negative for chills and fever.  HENT: Negative for hearing loss.   Eyes: Negative for visual disturbance.  Respiratory: Negative for cough and shortness of breath.   Cardiovascular: Negative for chest pain.  Gastrointestinal: Negative for abdominal pain.  Neurological: Negative for seizures.  Psychiatric/Behavioral: Positive for dysphoric mood and hallucinations. Negative for agitation and suicidal ideas.    Blood pressure 136/78, pulse 77, temperature 98.4 F (36.9 C), temperature source Oral, resp. rate 16, height 5\' 11"  (1.803 m), weight 73.5 kg, SpO2 100 %.Body mass index is 22.59 kg/m.  General Appearance: Casual  Patient observed by RN talking to self and appears to be preoccupied.   Eye Contact:  None  Speech:  Blocked  Volume:  Normal  Mood:  Dysphoric  Affect:  guarded  Thought Process:  Disorganized  Orientation:  Full (Time, Place, and Person)  Thought Content:  Illogical and Paranoid Ideation  Suicidal Thoughts:  No  Homicidal Thoughts:  No  Memory:  Immediate;   NA Recent;   NA  Judgement:  Impaired  Insight:  Lacking  Psychomotor Activity:  Decreased  Concentration:  Concentration: Fair  Recall:  of Knowledge:  Fair  Language:  Fair  Akathisia:  No  Handed:  Right  AIMS (if indicated):     Assets:  Physical Health Social Support  ADL's:  Impaired  Cognition:  Impaired,  Mild  Sleep:       Treatment Plan Summary: Daily contact with patient to assess and evaluate symptoms and progress in treatment and Medication management     ASSESSMENT: Patient is seen and examined.  Patient is a 25 year old male with the above-stated past psychiatric and medical history who was admitted with symptoms of psychosis secondary to non-adherence to medications.  PLAN: -inpatient psychiatric admission will be continued. -patient  will be integrated in the milieu.   -patient will be encouraged to attend groups.    -Medications: the patient will be started on oral Paliperidone tonight with a plan, if tolerates well, to switch him to LAI-medication, which is preferable taking in consideration his medication non-compliance.  -patient`s EKG showed a sinus rhythm with a normal QTc interval.  This will be monitored as the patient will be on antipsychotic medication.  -Collateral information obtained from patient`s mother.  -Disposition will be  determined after the patient is stabilized.    Observation Level/Precautions:  15 minute checks  Laboratory:  n/a  Psychotherapy:    Medications:    Consultations:    Discharge Concerns:    Estimated LOS:  Other:     Physician Treatment Plan for Primary Diagnosis: <principal problem not specified> Long Term Goal(s): Improvement in symptoms so as ready for discharge  Short Term Goals: Ability to identify changes in lifestyle to reduce recurrence of condition will improve, Ability to verbalize feelings will improve, Ability to disclose and discuss suicidal ideas, Ability to demonstrate self-control will improve, Ability to identify and develop effective coping behaviors will improve, Ability to maintain clinical measurements within normal limits will improve, Compliance with prescribed medications will improve and Ability to identify triggers associated with substance abuse/mental health issues will improve  Physician Treatment Plan for Secondary Diagnosis: Active Problems:   Schizophrenia (HCC)  Long Term Goal(s): Improvement in symptoms so as ready for discharge  Short Term Goals: Ability to identify changes in lifestyle to reduce recurrence of condition will improve, Ability to verbalize feelings will improve, Ability to disclose and discuss suicidal ideas, Ability to demonstrate self-control will improve, Ability to identify and develop effective coping behaviors will  improve, Ability to maintain clinical measurements within normal limits will improve, Compliance with prescribed medications will improve and Ability to identify triggers associated with substance abuse/mental health issues will improve  I certify that inpatient services furnished can reasonably be expected to improve the patient's condition.    Thalia Party, MD 1/15/20222:10 PM

## 2020-10-05 NOTE — B[facility] Counselor (Signed)
CSW attempted to complete PSA. Patient stated that he was tired and cold and asked CSW to come back tomorrow.

## 2020-10-05 NOTE — Plan of Care (Signed)
Newly admitted and continues to experience disorganized thinking and bizarre behavior. Appetite improving. Talking to self and appears to be preoccupied.

## 2020-10-05 NOTE — Progress Notes (Signed)
Patient has stayed in the milieu but continues to display bizarre behavior, mocking himself, arguing with himself. He has no aggressive behavior and does not seem to be anxious. Patient ate his meals as served. No major sign of distress noted. Safety monitored as expected.

## 2020-10-05 NOTE — [facility] Group Notes (Signed)
[facility] Group Notes: (Clinical Social Work)   10/05/2020      Type of Therapy:  Group Therapy   Participation Level:  Did Not Attend - was invited individually by Nurse/MHT and chose not to attend.   Susa Simmonds, LCSWA 10/05/2020  1:39 PM

## 2020-10-05 NOTE — [facility] Suicide Risk Assessment (Signed)
[facility] Admission Suicide Risk Assessment   Nursing information obtained from:  Patient Demographic factors:  Male,Unemployed Current Mental Status:  NA Loss Factors:  NA Historical Factors:  NA Risk Reduction Factors:  NA Principal Problem: <principal problem not specified> Diagnosis:  Active Problems:   Schizophrenia (HCC)  Subjective Data:  Eric Pineda is a 25yo M with a history of Schizophrenia, who was admitted to [facility] unit due to exacerbation of psychosis in settings of non-compliance with psych medications.  CLINICAL FACTORS:   Schizophrenia:   Paranoid or undifferentiated type   COGNITIVE FEATURES THAT CONTRIBUTE TO RISK:  Closed-mindedness    SUICIDE RISK:   Minimal: No identifiable suicidal ideation.  Patients presenting with no risk factors but with morbid ruminations; may be classified as minimal risk based on the severity of the depressive symptoms  PLAN OF CARE:  Patient is seen and examined. Patient is a 25 year old male with the above-stated past psychiatric and medical history who was admitted with symptoms of psychosis secondary to non-adherence to medications.  PLAN: -inpatient psychiatric admission will be continued. -patient will be integrated in the milieu.  -patient will be encouraged to attend groups.   -Medications: the patient will be started on oral Paliperidone tonight with a plan, if tolerates well, to switch him to LAI-medication, which is preferable taking in consideration his medication non-compliance.  -patient`s EKG showed a sinus rhythm with a normal QTc interval. This will be monitored as the patient will be on antipsychotic medication.  -Collateral information obtained from patient`s mother.  -Disposition will be determined after the patient is stabilized.   I certify that inpatient services furnished can reasonably be expected to improve the patient's condition.   Thalia Party, MD 10/05/2020, 2:45 PM

## 2020-10-05 NOTE — Tx Team (Signed)
Initial Treatment Plan 10/05/2020 1:35 PM [name] TAV:[id number]    PATIENT STRESSORS: Medication change or noncompliance Substance abuse   PATIENT STRENGTHS: Communication skills Supportive family/friends   PATIENT IDENTIFIED PROBLEMS: Disturbed thought process  Medication non compliance  Substance use                 DISCHARGE CRITERIA:  Improved stabilization in mood, thinking, and/or behavior Motivation to continue treatment in a less acute level of care Verbal commitment to aftercare and medication compliance  PRELIMINARY DISCHARGE PLAN: Outpatient therapy Return to previous living arrangement  PATIENT/FAMILY INVOLVEMENT: This treatment plan has been presented to and reviewed with the patient, Eric Pineda.  The patient has been given the opportunity to ask questions and make suggestions.  Olin Pia, RN 10/05/2020, 1:35 PM

## 2020-10-05 NOTE — Progress Notes (Addendum)
Patient ID: Zac Torti, male   DOB: [birth year], 25 y.o.   MRN: [id number] 25 year-old male presenting involuntarily with increased disturbed  thought process. Currently  Appears preoccupied  Bizarre behaviors. He was discharged from this unit a few months ago.  "I have thirty sisters.. my mom works at a bee farm...she works with power point...". per patient's mother, patient has been isolative in room at home, not eating, not engaged in any activities. Often seen arguing to self, talking to self.  Patient's father is the legal guardian. Upon admission, patient was unable to actively participate in assessment  But he is able to express his feelings. Patient reported feeling hungry and ate all his meal. He continues to pace, preoccupied. He denies thoughts of self-harm. Skin assessment performed by writer assisted by Mickeal Needy, RN: no skin issues noted. However, patient continues to report that he has an open wound in the groin and states "I got this from a guy who had a huge wound on the face, and I sat next to him and he infected me..". Patient was oriented to the unit and safety precautions initiated.

## 2020-10-06 MED ORDER — ENSURE ENLIVE PO LIQD
237.0000 mL | Freq: Three times a day (TID) | ORAL | Status: DC
  Administered 2020-10-06 – 2020-10-17 (×28): 237 mL via ORAL

## 2020-10-06 MED ORDER — ADULT MULTIVITAMIN W/MINERALS CH
1.0000 | ORAL_TABLET | Freq: Every day | ORAL | Status: DC
  Administered 2020-10-06 – 2020-10-17 (×12): 1 via ORAL
  Filled 2020-10-06 (×12): qty 1

## 2020-10-06 NOTE — Plan of Care (Signed)
Patient talking to himself and walking in a bizarre way.Patient states " I am walking and singing." Patient not able to make a logical conversation with staff. Denies SI,HI and AVH.Compliant with medications. No aggressive behaviors noted.Appetite and energy level good.Encouraged for shower,patient refused.Support and encouragement given.

## 2020-10-06 NOTE — B[facility] Counselor (Signed)
CSW went to patients room to complete PSA. Patient was asleep

## 2020-10-06 NOTE — Progress Notes (Signed)
NUTRITION ASSESSMENT  Pt identified as at risk on the Malnutrition Screen Tool  INTERVENTION: 1. Supplements: Ensure Enlive po TID, each supplement provides 350 kcal and 20 grams of protein  NUTRITION DIAGNOSIS: Unintentional weight loss related to sub-optimal intake as evidenced by pt report.   Goal: Pt to meet >/= 90% of their estimated nutrition needs.  Monitor:  PO intake  Assessment:  Eric Pineda is a 25yo M with a history of Schizophrenia, who was admitted to [facility] unit due to exacerbation of psychosis in settings of non-compliance with psych medications.  Pt unavailable at time of attempted contact.   Per RN notes, pt eating meals well. No meal completion records to assess at this time.   Reviewed wt hx; pt has experienced a 9.9% wt loss over the past 6 months. RD also questions accuracy of weights given variablility of wt over the past day. While this is not significant for time frame, it is concerning given pt's erratic behavior and psychiatric illness. Pt would greatly benefit from addition of oral nutritional supplements to help meet nutritional goals.   Labs reviewed.   25 y.o. male  Height: Ht Readings from Last 1 Encounters:  10/05/20 5\' 11"  (1.803 m)    Weight: Wt Readings from Last 1 Encounters:  10/05/20 73.5 kg    Weight Hx: Wt Readings from Last 10 Encounters:  10/05/20 73.5 kg  10/04/20 79.4 kg  04/16/20 81.6 kg  12/03/19 71.7 kg  12/01/19 76.7 kg    BMI:  Body mass index is 22.59 kg/m.  Estimated Nutritional Needs: Kcal: 25-30 kcal/kg Protein: > 1 gram protein/kg Fluid: 1 ml/kcal  Diet Order:  Diet Order            Diet regular Room service appropriate? Yes; Fluid consistency: Thin  Diet effective now                Pt is also offered choice of unit snacks mid-morning and mid-afternoon.  Pt is eating as desired.   Lab results and medications reviewed.   01/31/20, RD, LDN, CDCES Registered Dietitian II Certified Diabetes Care and  Education Specialist Please refer to [facility] for RD and/or RD on-call/weekend/after hours pager

## 2020-10-06 NOTE — Progress Notes (Signed)
D:  Patient visible on the milieu responding to internal stimuli making bizarre movements. A: Support and encouragement offered. Scheduled medications given to pt. Q 15 min checks continued for patient safety. R:  Patient remains safe on the unit.

## 2020-10-06 NOTE — Progress Notes (Signed)
[facility] Note  10/06/2020 11:46 AM Eric GoltzCristian Pineda  MRN:  [id number]  Principal Problem: <principal problem not specified> Diagnosis: Active Problems:   Schizophrenia (HCC)  Total Time spent with patient: 15 minutes  Eric Pineda is a 25yo M with a history of Schizophrenia, who [facility] due to exacerbation of psychosis in settings of non-compliance with psych medications.  Interval History Patient was seen today for re-evaluation.  Nursing reports no events overnight. Per RN note "Patient has stayed in the milieu but continues to display bizarre behavior, mocking himself, arguing with himself. He has no aggressive behavior and does not seem to be anxious. Patient ate his meals as served." The patient has minor issues with performing ADLs.  Patient has been medication compliant.    Subjective:  On assessment, patient participates in conversation minimally; he mostly gives shorn "yes/no" answers. He reports "I am okay". He says "yes" to confirm, that he started a new medication last night. He answers "no" to questions about side effects from medications, depression, anxiety, suicidal thoughts, homicidal thoughts, auditory hallucinations, visual hallucinations, any physical complaints.     Labs: no new results for review.  Past Psychiatric History:  Psych Dx: schizophrenia; started about a year ago.  Has had 2 hospitalizations in the past year: in March 2021 to [facility] and in July 2021 to [facility].  Pasty med trials: olanzapine during his first hospitalization. Unknown what medication he was on for the second.  No known history of suicidal or violent behavior.  No known substance abuse history. He is not on any outpatient medications for last 4 months at least.  Past Medical History:  Past Medical History:  Diagnosis Date  . Bizarre behavior 12/02/2019   History reviewed. No pertinent surgical history. Family History: History reviewed. No pertinent family  history. Family Psychiatric  History: unknown  Social History:  Social History   Substance and Sexual Activity  Alcohol Use None     Social History   Substance and Sexual Activity  Drug Use Not on file    Social History   Socioeconomic History  . Marital status: Single    Spouse name: Not on file  . Number of children: Not on file  . Years of education: Not on file  . Highest education level: Not on file  Occupational History  . Not on file  Tobacco Use  . Smoking status: Never Smoker  . Smokeless tobacco: Never Used  Substance and Sexual Activity  . Alcohol use: Not on file  . Drug use: Not on file  . Sexual activity: Not on file  Other Topics Concern  . Not on file  Social History Narrative  . Not on file   Social Determinants of Health   Financial Resource Strain: Not on file  Food Insecurity: Not on file  Transportation Needs: Not on file  Physical Activity: Not on file  Stress: Not on file  Social Connections: Not on file   Additional Social History:                         Sleep: Fair  Appetite:  Fair  Current Medications: Current Facility-Administered Medications  Medication Dose Route Frequency Provider Last Rate Last Admin  . acetaminophen (TYLENOL) tablet 650 mg  650 mg Oral Q6H PRN Thalia PartyPaliy, Latoya Maulding, MD      . alum & mag hydroxide-simeth (MAALOX/MYLANTA) 200-200-20 MG/5ML suspension 30 mL  30 mL Oral Q4H PRN Lachandra Dettmann,  Serina CowperAlisa, MD      . feeding supplement (ENSURE ENLIVE / ENSURE PLUS) liquid 237 mL  237 mL Oral TID BM Nailah Luepke, MD      . hydrOXYzine (ATARAX/VISTARIL) tablet 25 mg  25 mg Oral TID PRN Thalia PartyPaliy, Bertie Mcconathy, MD      . influenza vac split quadrivalent PF (FLUARIX) injection 0.5 mL  0.5 mL Intramuscular Tomorrow-1000 Derrian Rodak, MD      . magnesium hydroxide (MILK OF MAGNESIA) suspension 30 mL  30 mL Oral Daily PRN Thalia PartyPaliy, Tanishi Nault, MD      . multivitamin with minerals tablet 1 tablet  1 tablet Oral Daily Ferrell Claiborne, MD      .  paliperidone (INVEGA) 24 hr tablet 6 mg  6 mg Oral QHS Thalia PartyPaliy, Clairessa Boulet, MD   6 mg at 10/05/20 2143  . traZODone (DESYREL) tablet 50 mg  50 mg Oral QHS PRN Thalia PartyPaliy, Melaina Howerton, MD        Lab Results:  Results for orders placed or performed during the hospital encounter of 10/04/20 (from the past 48 hour(s))  Comprehensive metabolic panel     Status: Abnormal   Collection Time: 10/04/20  3:39 PM  Result Value Ref Range   Sodium 139 135 - 145 mmol/L   Potassium 3.9 3.5 - 5.1 mmol/L   Chloride 102 98 - 111 mmol/L   CO2 24 22 - 32 mmol/L   Glucose, Bld 111 (H) 70 - 99 mg/dL    Comment: Glucose reference range applies only to samples taken after fasting for at least 8 hours.   BUN 17 6 - 20 mg/dL   Creatinine, Ser 1.611.09 0.61 - 1.24 mg/dL   Calcium 9.8 8.9 - 09.610.3 mg/dL   Total Protein 7.7 6.5 - 8.1 g/dL   Albumin 4.8 3.5 - 5.0 g/dL   AST 18 15 - 41 U/L   ALT 14 0 - 44 U/L   Alkaline Phosphatase 68 38 - 126 U/L   Total Bilirubin 0.7 0.3 - 1.2 mg/dL   GFR, Estimated >04>60 >54>60 mL/min    Comment: (NOTE) Calculated using the CKD-EPI Creatinine Equation (2021)    Anion gap 13 5 - 15    Comment: Performed at [facility], [address] [place], [place] [address]  Ethanol     Status: None   Collection Time: 10/04/20  3:39 PM  Result Value Ref Range   Alcohol, Ethyl (B) <10 <10 mg/dL    Comment: (NOTE) Lowest detectable limit for serum alcohol is 10 mg/dL.  For medical purposes only. Performed at [facility], [address] [place], [place] [address]   Salicylate level     Status: Abnormal   Collection Time: 10/04/20  3:39 PM  Result Value Ref Range   Salicylate Lvl <7.0 (L) 7.0 - 30.0 mg/dL    Comment: Performed at [facility], [address] [place], [place] [address]  Acetaminophen level     Status: Abnormal   Collection Time: 10/04/20  3:39 PM  Result Value Ref Range   Acetaminophen (Tylenol), Serum <10 (L) 10 - 30 ug/mL    Comment: (NOTE) Therapeutic  concentrations vary significantly. A range of 10-30 ug/mL  may be an effective concentration for many patients. However, some  are best treated at concentrations outside of this range. Acetaminophen concentrations >150 ug/mL at 4 hours after ingestion  and >50 ug/mL at 12 hours after ingestion are often associated with  toxic reactions.  Performed at [facility], [address] [place], [place] [address]   cbc     Status: None   Collection Time: 10/04/20  3:39 PM  Result Value Ref Range   WBC 8.5 4.0 - 10.5 K/uL   RBC 4.89 4.22 - 5.81 MIL/uL   Hemoglobin 14.8 13.0 - 17.0 g/dL   HCT 61.4 43.1 - 54.0 %   MCV 86.5 80.0 - 100.0 fL   MCH 30.3 26.0 - 34.0 pg   MCHC 35.0 30.0 - 36.0 g/dL   RDW 08.6 76.1 - 95.0 %   Platelets 337 150 - 400 K/uL   nRBC 0.0 0.0 - 0.2 %    Comment: Performed at [facility], [address] [place], [place] [address]  Lipid panel     Status: Abnormal   Collection Time: 10/04/20  3:39 PM  Result Value Ref Range   Cholesterol 171 0 - 200 mg/dL   Triglycerides 61 <124 mg/dL   HDL 55 >58 mg/dL   Total CHOL/HDL Ratio 3.1 RATIO   VLDL 12 0 - 40 mg/dL   LDL Cholesterol 099 (H) 0 - 99 mg/dL    Comment:        Total Cholesterol/HDL:CHD Risk Coronary Heart Disease Risk Table                     Men   Women  1/2 Average Risk   3.4   3.3  Average Risk       5.0   4.4  2 X Average Risk   9.6   7.1  3 X Average Risk  23.4   11.0        Use the calculated Patient Ratio above and the CHD Risk Table to determine the patient's CHD Risk.        ATP III CLASSIFICATION (LDL):  <100     mg/dL   Optimal  833-825  mg/dL   Near or Above                    Optimal  130-159  mg/dL   Borderline  053-976  mg/dL   High  >734     mg/dL   Very High Performed at [facility], [address] [place], [place] [address]   Resp Panel by RT-PCR (Flu A&B, Covid) Nasopharyngeal Swab     Status: None   Collection Time: 10/04/20  6:35 PM    Specimen: Nasopharyngeal Swab; Nasopharyngeal(NP) swabs in vial transport medium  Result Value Ref Range   SARS Coronavirus 2 by RT PCR NEGATIVE NEGATIVE    Comment: (NOTE) SARS-CoV-2 target nucleic acids are NOT DETECTED.  The SARS-CoV-2 RNA is generally detectable in upper respiratory specimens during the acute phase of infection. The lowest concentration of SARS-CoV-2 viral copies this assay can detect is 138 copies/mL. A negative result does not preclude SARS-Cov-2 infection and should not be used as the sole basis for treatment or other patient management decisions. A negative result may occur with  improper specimen collection/handling, submission of specimen other than nasopharyngeal swab, presence of viral mutation(s) within the areas targeted by this assay, and inadequate number of viral copies(<138 copies/mL). A negative result must be combined with clinical observations, patient history, and epidemiological information. The expected result is Negative.  Fact Sheet for Patients:  BloggerCourse.com  Fact Sheet for Healthcare Providers:  SeriousBroker.it  This test is no t yet approved or cleared by the [place] FDA and  has been authorized for detection and/or diagnosis  of SARS-CoV-2 by FDA under an Emergency Use Authorization (EUA). This EUA will remain  in effect (meaning this test can be used) for the duration of the COVID-19 declaration under Section 564(b)(1) of the Act, 21 U.S.C.section 360bbb-3(b)(1), unless the authorization is terminated  or revoked sooner.       Influenza A by PCR NEGATIVE NEGATIVE   Influenza B by PCR NEGATIVE NEGATIVE    Comment: (NOTE) The Xpert Xpress SARS-CoV-2/FLU/RSV plus assay is intended as an aid in the diagnosis of influenza from Nasopharyngeal swab specimens and should not be used as a sole basis for treatment. Nasal washings and aspirates are unacceptable for Xpert Xpress  SARS-CoV-2/FLU/RSV testing.  Fact Sheet for Patients: BloggerCourse.com  Fact Sheet for Healthcare Providers: SeriousBroker.it  This test is not yet approved or cleared by the [place] FDA and has been authorized for detection and/or diagnosis of SARS-CoV-2 by FDA under an Emergency Use Authorization (EUA). This EUA will remain in effect (meaning this test can be used) for the duration of the COVID-19 declaration under Section 564(b)(1) of the Act, 21 U.S.C. section 360bbb-3(b)(1), unless the authorization is terminated or revoked.  Performed at [facility], [address] [place], [place] [address]     Blood Alcohol level:  Lab Results  Component Value Date   [facility] <10 10/04/2020   ETH <10 04/16/2020    Metabolic Disorder Labs: Lab Results  Component Value Date   HGBA1C 5.1 12/03/2019   MPG 99.67 12/03/2019   No results found for: PROLACTIN Lab Results  Component Value Date   CHOL 171 10/04/2020   TRIG 61 10/04/2020   HDL 55 10/04/2020   CHOLHDL 3.1 10/04/2020   VLDL 12 10/04/2020   LDLCALC 104 (H) 10/04/2020    Physical Findings: AIMS:  , ,  ,  ,    CIWA:    COWS:     Musculoskeletal: Strength & Muscle Tone: within normal limits Gait & Station: normal Patient leans: N/A  Psychiatric Specialty Exam: Physical Exam  Review of Systems  Blood pressure 136/78, pulse 77, temperature 98.4 F (36.9 C), temperature source Oral, resp. rate 16, height 5\' 11"  (1.803 m), weight 73.5 kg, SpO2 100 %.Body mass index is 22.59 kg/m.  General Appearance: Bizarre  Eye Contact:  Poor  Speech:  Blocked  Volume:  Decreased  Mood:  Anxious  Affect:  Restricted  Thought Process:  Coherent and Disorganized  Orientation:  Full (Time, Place, and Person)  Thought Content:  Illogical and Delusions  Suicidal Thoughts:  No  Homicidal Thoughts:  No  Memory:  NA  Judgement:  Impaired  Insight:  Lacking   Psychomotor Activity:  Decreased  Concentration:  Concentration: Poor and Attention Span: Poor  Recall:  NA  Fund of Knowledge:  NA  Language:  Fair  Akathisia:  No  Handed:  Right  AIMS (if indicated):     Assets:  Housing Physical Health Resilience Social Support  ADL's:  Impaired  Cognition:  Impaired,  Mild  Sleep:  Number of Hours: 3.3     Treatment Plan Summary: Daily contact with patient to assess and evaluate symptoms and progress in treatment and Medication management  Patient is a 25 year old male with the above-stated past psychiatric history who is seen in follow-up.  Chart reviewed. Patient discussed with nursing. Patient started oral Paliperidone last night, tolerated well. Patient continues to display bizarre behavior, socially withdrawn and internally-preoccupied. Will continue current antipsychotic with a plan to switch to LAI form due  to h/o medication non-compliance. Dietitian consulted, feeding supplement added.  Plan:  -continue inpatient psych admission; 15-minute checks; daily contact with patient to assess and evaluate symptoms and progress in treatment; psychoeducation.  -continue scheduled medications: . feeding supplement  237 mL Oral TID BM  . influenza vac split quadrivalent PF  0.5 mL Intramuscular Tomorrow-1000  . multivitamin with minerals  1 tablet Oral Daily  . paliperidone  6 mg Oral QHS    -continue PRN medications.  acetaminophen, alum & mag hydroxide-simeth, hydrOXYzine, magnesium hydroxide, traZODone  -Pertinent Labs: no new labs ordered today   -patient`s EKG showed a sinus rhythm without prolonged QTc interval. This will be monitored as the patient will be on antipsychotic medication.    -Consults: No new consults placed since yesterday    -Disposition will be determined after the patient is stabilized. All necessary aftercare will be arranged prior to discharge Likely d/c home with outpatient psych follow-up.  -  I certify  that the patient does need, on a daily basis, active treatment furnished directly by or requiring the supervision of inpatient psychiatric facility personnel.   Thalia Party, MD 10/06/2020, 11:46 AM

## 2020-10-07 MED ORDER — BISMUTH SUBSALICYLATE 262 MG PO CHEW
524.0000 mg | CHEWABLE_TABLET | ORAL | Status: DC | PRN
  Filled 2020-10-07: qty 2

## 2020-10-07 MED ORDER — PALIPERIDONE PALMITATE ER 234 MG/1.5ML IM SUSY
234.0000 mg | PREFILLED_SYRINGE | Freq: Once | INTRAMUSCULAR | Status: AC
  Administered 2020-10-08: 234 mg via INTRAMUSCULAR
  Filled 2020-10-07: qty 1.5

## 2020-10-07 NOTE — Plan of Care (Signed)
  Problem: Education: Goal: Ability to state activities that reduce stress will improve Outcome: Progressing   Problem: Education: Goal: Will be free of psychotic symptoms Outcome: Progressing Goal: Knowledge of the prescribed therapeutic regimen will improve Outcome: Progressing   Problem: Education: Goal: Knowledge of disease or condition will improve Outcome: Progressing   Problem: Health Behavior/Discharge Planning: Goal: Ability to identify changes in lifestyle to reduce recurrence of condition will improve Outcome: Progressing   Problem: Physical Regulation: Goal: Complications related to the disease process, condition or treatment will be avoided or minimized Outcome: Progressing   Problem: Safety: Goal: Ability to remain free from injury will improve Outcome: Progressing   Problem: Education: Goal: Knowledge of [facility] Education information/materials will improve Outcome: Progressing

## 2020-10-07 NOTE — [facility] Group Notes (Signed)
Group was not held due to inclement weather.  CSW team worked from home.  Marcellene Shivley, MSW, LCSW 10/07/2020 4:52 PM  

## 2020-10-07 NOTE — Plan of Care (Signed)
Continues to exhibit disorganized thinking and behaviors. Preoccupied and constantly talking to self, grimacing.... Otherwise cooperative and redirectable. Appetite improved. Not involved in group activities.  Taking medications as scheduled. Has no additional concerns. Safety precautions reinforced.

## 2020-10-07 NOTE — Progress Notes (Signed)
Patient came to nurse's station asking to speak to this Clinical research associate. Patient asked this writer if he could shave. This writer went and watched patient shave, without any issues.

## 2020-10-07 NOTE — Progress Notes (Signed)
[facility] MD Progress Note  10/07/2020 2:29 PM Eric Pineda  MRN:  [id number]  Principal Problem: <principal problem not specified> Diagnosis: Active Problems:   Schizophrenia (HCC)  Total Time spent with patient: 15 minutes  Mr. Mckellips is a 25yo M with a history of Schizophrenia, who was admitted to [facility] unit due to exacerbation of psychosis in settings of non-compliance with psych medications.  Interval History Patient was seen today for re-evaluation.  Nursing reports no events overnight. Per nursing staff, has been noted to be speaking to himself and grimacing almost constantly this admission. He has no aggressive behavior and does not seem to be anxious. Patient ate his meals as served." The patient has minor issues with performing ADLs.  Patient has been medication compliant.    Subjective:  On assessment, patient participates in conversation minimally; he mostly gives shorn "yes/no" answers. He reports "I am okay". He does note he is feeling nauseated today. When asked if he would like some pepto bismol he says "yes". He says "no"   to questions about side effects from medications, depression, anxiety, suicidal thoughts, homicidal thoughts, auditory hallucinations, and visual hallucinations.  Labs: no new results for review.  Past Psychiatric History:  Psych Dx: schizophrenia; started about a year ago.  Has had 2 hospitalizations in the past year: in March 2021 to [facility] and in July 2021 to [facility].  Pasty med trials: olanzapine during his first hospitalization. Unknown what medication he was on for the second.  No known history of suicidal or violent behavior.  No known substance abuse history. He is not on any outpatient medications for last 4 months at least.  Past Medical History:  Past Medical History:  Diagnosis Date  . Bizarre behavior 12/02/2019   History reviewed. No pertinent surgical history. Family History: History reviewed. No pertinent family history. Family  Psychiatric  History: unknown  Social History:  Social History   Substance and Sexual Activity  Alcohol Use None     Social History   Substance and Sexual Activity  Drug Use Not on file    Social History   Socioeconomic History  . Marital status: Single    Spouse name: Not on file  . Number of children: Not on file  . Years of education: Not on file  . Highest education level: Not on file  Occupational History  . Not on file  Tobacco Use  . Smoking status: Never Smoker  . Smokeless tobacco: Never Used  Substance and Sexual Activity  . Alcohol use: Not on file  . Drug use: Not on file  . Sexual activity: Not on file  Other Topics Concern  . Not on file  Social History Narrative  . Not on file   Social Determinants of Health   Financial Resource Strain: Not on file  Food Insecurity: Not on file  Transportation Needs: Not on file  Physical Activity: Not on file  Stress: Not on file  Social Connections: Not on file   Additional Social History:                         Sleep: Fair  Appetite:  Fair  Current Medications: Current Facility-Administered Medications  Medication Dose Route Frequency Provider Last Rate Last Admin  . acetaminophen (TYLENOL) tablet 650 mg  650 mg Oral Q6H PRN Thalia Party, MD      . alum & mag hydroxide-simeth (MAALOX/MYLANTA) 200-200-20 MG/5ML suspension 30 mL  30 mL Oral Q4H PRN  Thalia PartyPaliy, Alisa, MD      . bismuth subsalicylate (PEPTO BISMOL) chewable tablet 524 mg  524 mg Oral Q1H PRN Jesse SansFreeman, Callia Swim M, MD      . feeding supplement (ENSURE ENLIVE / ENSURE PLUS) liquid 237 mL  237 mL Oral TID BM Paliy, Alisa, MD   237 mL at 10/07/20 0945  . hydrOXYzine (ATARAX/VISTARIL) tablet 25 mg  25 mg Oral TID PRN Thalia PartyPaliy, Alisa, MD   25 mg at 10/06/20 1726  . influenza vac split quadrivalent PF (FLUARIX) injection 0.5 mL  0.5 mL Intramuscular Tomorrow-1000 Paliy, Alisa, MD      . magnesium hydroxide (MILK OF MAGNESIA) suspension 30 mL  30 mL  Oral Daily PRN Thalia PartyPaliy, Alisa, MD      . multivitamin with minerals tablet 1 tablet  1 tablet Oral Daily Thalia PartyPaliy, Alisa, MD   1 tablet at 10/07/20 0825  . paliperidone (INVEGA SUSTENNA) injection 234 mg  234 mg Intramuscular Once Jesse SansFreeman, Eve Rey M, MD      . paliperidone (INVEGA) 24 hr tablet 6 mg  6 mg Oral QHS Thalia PartyPaliy, Alisa, MD   6 mg at 10/06/20 2206  . traZODone (DESYREL) tablet 50 mg  50 mg Oral QHS PRN Thalia PartyPaliy, Alisa, MD        Lab Results:  No results found for this or any previous visit (from the past 48 hour(s)).  Blood Alcohol level:  Lab Results  Component Value Date   ETH <10 10/04/2020   ETH <10 04/16/2020    Metabolic Disorder Labs: Lab Results  Component Value Date   HGBA1C 5.1 12/03/2019   MPG 99.67 12/03/2019   No results found for: PROLACTIN Lab Results  Component Value Date   CHOL 171 10/04/2020   TRIG 61 10/04/2020   HDL 55 10/04/2020   CHOLHDL 3.1 10/04/2020   VLDL 12 10/04/2020   LDLCALC 104 (H) 10/04/2020    Physical Findings: AIMS:  , ,  ,  ,    CIWA:    COWS:     Musculoskeletal: Strength & Muscle Tone: within normal limits Gait & Station: normal Patient leans: N/A  Psychiatric Specialty Exam: Physical Exam Vitals and nursing note reviewed.  Constitutional:      General: He is not in acute distress. HENT:     Head: Normocephalic and atraumatic.     Right Ear: External ear normal.     Left Ear: External ear normal.     Nose: Nose normal.     Mouth/Throat:     Mouth: Mucous membranes are moist.     Pharynx: Oropharynx is clear.  Eyes:     Extraocular Movements: Extraocular movements intact.     Conjunctiva/sclera: Conjunctivae normal.     Pupils: Pupils are equal, round, and reactive to light.  Cardiovascular:     Rate and Rhythm: Normal rate.     Pulses: Normal pulses.  Pulmonary:     Effort: Pulmonary effort is normal.     Breath sounds: Normal breath sounds.  Abdominal:     General: Abdomen is flat.     Palpations: Abdomen is  soft.  Musculoskeletal:        General: No swelling. Normal range of motion.     Cervical back: Normal range of motion and neck supple.  Skin:    General: Skin is warm and dry.  Neurological:     General: No focal deficit present.     Mental Status: He is alert and oriented to person, place, and time.  Psychiatric:        Attention and Perception: He is inattentive. He perceives auditory hallucinations.        Mood and Affect: Mood is anxious. Affect is flat.        Speech: Speech is delayed.        Behavior: Behavior is withdrawn.        Thought Content: Thought content is paranoid and delusional.        Judgment: Judgment is impulsive.     Review of Systems  Constitutional: Positive for fatigue. Negative for appetite change.  HENT: Negative for rhinorrhea and sore throat.   Eyes: Negative for photophobia and visual disturbance.  Respiratory: Negative for cough and shortness of breath.   Cardiovascular: Negative for chest pain and palpitations.  Gastrointestinal: Positive for nausea. Negative for constipation, diarrhea and vomiting.  Endocrine: Negative for cold intolerance and heat intolerance.  Genitourinary: Negative for difficulty urinating and dysuria.  Musculoskeletal: Negative for arthralgias and myalgias.  Skin: Negative for rash and wound.  Allergic/Immunologic: Negative for environmental allergies and food allergies.  Neurological: Negative for dizziness and light-headedness.  Hematological: Negative for adenopathy. Does not bruise/bleed easily.  Psychiatric/Behavioral: Positive for behavioral problems, hallucinations and sleep disturbance. The patient is nervous/anxious.     Blood pressure 117/88, pulse 79, temperature 97.7 F (36.5 C), temperature source Oral, resp. rate 17, height 5\' 11"  (1.803 m), weight 73.5 kg, SpO2 99 %.Body mass index is 22.59 kg/m.  General Appearance: Bizarre  Eye Contact:  Poor  Speech:  Blocked  Volume:  Decreased  Mood:  Anxious   Affect:  Restricted  Thought Process:  Coherent and Disorganized  Orientation:  Full (Time, Place, and Person)  Thought Content:  Illogical and Delusions  Suicidal Thoughts:  No  Homicidal Thoughts:  No  Memory:  NA  Judgement:  Impaired  Insight:  Lacking  Psychomotor Activity:  Decreased  Concentration:  Concentration: Poor and Attention Span: Poor  Recall:  NA  Fund of Knowledge:  NA  Language:  Fair  Akathisia:  No  Handed:  Right  AIMS (if indicated):     Assets:  Housing Physical Health Resilience Social Support  ADL's:  Impaired  Cognition:  Impaired,  Mild  Sleep:  Number of Hours: 2.45     Treatment Plan Summary: Daily contact with patient to assess and evaluate symptoms and progress in treatment and Medication management  Patient is a 25 year old male with the above-stated past psychiatric history who is seen in follow-up.  Chart reviewed. Patient discussed with nursing.Patient started oral Paliperidone  tolerated well.Patient continues to display bizarre behavior, socially withdrawn and internally-preoccupied. Will continue current antipsychotic. Will offer Invega 234 mg IM injection today due to history of medication non-compliance. Dietitian consulted, feeding supplement added.  Plan:  -continue inpatient psych admission; 15-minute checks; daily contact with patient to assess and evaluate symptoms and progress in treatment; psychoeducation.  -continue scheduled medications: . feeding supplement  237 mL Oral TID BM  . influenza vac split quadrivalent PF  0.5 mL Intramuscular Tomorrow-1000  . multivitamin with minerals  1 tablet Oral Daily  . paliperidone  234 mg Intramuscular Once  . paliperidone  6 mg Oral QHS    -continue PRN medications.  acetaminophen, alum & mag hydroxide-simeth, bismuth subsalicylate, hydrOXYzine, magnesium hydroxide, traZODone  -Pertinent Labs: no new labs ordered today   -patient`s EKG showed a sinus rhythm without prolonged QTc  interval. This will be monitored as the patient will be on antipsychotic medication.    -  Consults: No new consults placed since yesterday    -Disposition will be determined after the patient is stabilized. All necessary aftercare will be arranged prior to discharge Likely d/c home with outpatient psych follow-up.  -  I certify that the patient does need, on a daily basis, active treatment furnished directly by or requiring the supervision of inpatient psychiatric facility personnel.   [name], MD 10/07/2020, 2:29 PM

## 2020-10-08 DIAGNOSIS — F061 Catatonic disorder due to known physiological condition: Secondary | ICD-10-CM | POA: Diagnosis present

## 2020-10-08 MED ORDER — LORAZEPAM 1 MG PO TABS
1.0000 mg | ORAL_TABLET | Freq: Three times a day (TID) | ORAL | Status: DC
  Administered 2020-10-08 – 2020-10-15 (×21): 1 mg via ORAL
  Filled 2020-10-08 (×21): qty 1

## 2020-10-08 NOTE — Progress Notes (Signed)
Patient is quiet and reserved. He has been active on the unit spending a lot of time in the dayroom. He engages well when approached by other peers, but mainly spends time to himself.  He declined his monthly IM Injection requesting that he receive it in the morning.  He was however compliant with his oral QHS medication and tolerated without incident. He denies SI  HI AVH depression anxiety and pain at this encounter.  He is safe on the unit with 15 minute safety checks and encouraged to contact staff with any concerns.      Cleo Butler-Nicholson,

## 2020-10-08 NOTE — Progress Notes (Signed)
[facility] MD Progress Note  10/08/2020 2:26 PM Eric Pineda  MRN:  [id number]  Principal Problem: Schizophrenia (HCC) Diagnosis: Principal Problem:   Schizophrenia (HCC) Active Problems:   Catatonia  Total Time spent with patient: 15 minutes  Eric Pineda is a 25yo M with a history of Schizophrenia, who was admitted to [facility] unit due to exacerbation of psychosis in settings of non-compliance with psych medications.  Interval History Patient was seen today for re-evaluation.  Nursing reports no events overnight. Per nursing staff, has been noted to be speaking to himself and grimacing almost constantly this admission. He has no aggressive behavior and does not seem to be anxious. Patient ate his meals as served." The patient has minor issues with performing ADLs.  Patient has been medication compliant.    Subjective:   On assessment, patient participates in conversation minimally; he mostly gives shorn "yes/no" answers. He reports "I am okay".  He says "no"   to questions about side effects from medications, depression, anxiety, suicidal thoughts, homicidal thoughts, auditory hallucinations, and visual hallucinations. He does say "yes" when asked if he will take Invega LAI, and nurse was able to administer medication this morning.   Patient observed displaying several symptoms of excited catatonia including echolalia, stereotypy, and mannerisms. He also has been withdrawn with minimal oral intake. He is also noted to be mute at times. Will start oral Ativan at this time to help improve symptoms.   Labs: no new results for review.  Past Psychiatric History:  Psych Dx: schizophrenia; started about a year ago.  Has had 2 hospitalizations in the past year: in March 2021 to [facility] and in July 2021 to [facility].  Pasty med trials: olanzapine during his first hospitalization. Unknown what medication he was on for the second.  No known history of suicidal or violent behavior.  No known substance abuse  history. He is not on any outpatient medications for last 4 months at least.  Past Medical History:  Past Medical History:  Diagnosis Date  . Bizarre behavior 12/02/2019   History reviewed. No pertinent surgical history. Family History: History reviewed. No pertinent family history. Family Psychiatric  History: unknown  Social History:  Social History   Substance and Sexual Activity  Alcohol Use None     Social History   Substance and Sexual Activity  Drug Use Not on file    Social History   Socioeconomic History  . Marital status: Significant Other    Spouse name: Not on file  . Number of children: Not on file  . Years of education: Not on file  . Highest education level: Not on file  Occupational History  . Not on file  Tobacco Use  . Smoking status: Never Smoker  . Smokeless tobacco: Never Used  Substance and Sexual Activity  . Alcohol use: Not on file  . Drug use: Not on file  . Sexual activity: Not on file  Other Topics Concern  . Not on file  Social History Narrative  . Not on file   Social Determinants of Health   Financial Resource Strain: Not on file  Food Insecurity: Not on file  Transportation Needs: Not on file  Physical Activity: Not on file  Stress: Not on file  Social Connections: Not on file   Additional Social History:                         Sleep: Fair  Appetite:  Fair  Current  Medications: Current Facility-Administered Medications  Medication Dose Route Frequency Provider Last Rate Last Admin  . acetaminophen (TYLENOL) tablet 650 mg  650 mg Oral Q6H PRN Thalia Party, MD      . alum & mag hydroxide-simeth (MAALOX/MYLANTA) 200-200-20 MG/5ML suspension 30 mL  30 mL Oral Q4H PRN Paliy, Alisa, MD      . bismuth subsalicylate (PEPTO BISMOL) chewable tablet 524 mg  524 mg Oral Q1H PRN [name], MD      . feeding supplement (ENSURE ENLIVE / ENSURE PLUS) liquid 237 mL  237 mL Oral TID BM Paliy, Alisa, MD   237 mL at  10/08/20 1328  . hydrOXYzine (ATARAX/VISTARIL) tablet 25 mg  25 mg Oral TID PRN Thalia Party, MD   25 mg at 10/06/20 1726  . influenza vac split quadrivalent PF (FLUARIX) injection 0.5 mL  0.5 mL Intramuscular Tomorrow-1000 Paliy, Alisa, MD      . LORazepam (ATIVAN) tablet 1 mg  1 mg Oral TID [name], MD      . magnesium hydroxide (MILK OF MAGNESIA) suspension 30 mL  30 mL Oral Daily PRN Thalia Party, MD      . multivitamin with minerals tablet 1 tablet  1 tablet Oral Daily Thalia Party, MD   1 tablet at 10/08/20 1128  . paliperidone (INVEGA) 24 hr tablet 6 mg  6 mg Oral QHS Thalia Party, MD   6 mg at 10/07/20 2141  . traZODone (DESYREL) tablet 50 mg  50 mg Oral QHS PRN Thalia Party, MD        Lab Results:  No results found for this or any previous visit (from the past 48 hour(s)).  Blood Alcohol level:  Lab Results  Component Value Date   ETH <10 10/04/2020   ETH <10 04/16/2020    Metabolic Disorder Labs: Lab Results  Component Value Date   HGBA1C 5.1 12/03/2019   MPG 99.67 12/03/2019   No results found for: PROLACTIN Lab Results  Component Value Date   CHOL 171 10/04/2020   TRIG 61 10/04/2020   HDL 55 10/04/2020   CHOLHDL 3.1 10/04/2020   VLDL 12 10/04/2020   LDLCALC 104 (H) 10/04/2020    Physical Findings: AIMS:  , ,  ,  ,    CIWA:    COWS:     Musculoskeletal: Strength & Muscle Tone: within normal limits Gait & Station: normal Patient leans: N/A  Psychiatric Specialty Exam: Physical Exam Vitals and nursing note reviewed.  Constitutional:      General: He is not in acute distress. HENT:     Head: Normocephalic and atraumatic.     Right Ear: External ear normal.     Left Ear: External ear normal.     Nose: Nose normal.     Mouth/Throat:     Mouth: Mucous membranes are moist.     Pharynx: Oropharynx is clear.  Eyes:     Extraocular Movements: Extraocular movements intact.     Conjunctiva/sclera: Conjunctivae normal.     Pupils: Pupils are  equal, round, and reactive to light.  Cardiovascular:     Rate and Rhythm: Normal rate.     Pulses: Normal pulses.  Pulmonary:     Effort: Pulmonary effort is normal.     Breath sounds: Normal breath sounds.  Abdominal:     General: Abdomen is flat.     Palpations: Abdomen is soft.  Musculoskeletal:        General: No swelling. Normal range of motion.  Cervical back: Normal range of motion and neck supple.  Skin:    General: Skin is warm and dry.  Neurological:     General: No focal deficit present.     Mental Status: He is alert and oriented to person, place, and time.  Psychiatric:        Attention and Perception: He is inattentive. He perceives auditory hallucinations.        Mood and Affect: Mood is anxious. Affect is flat.        Speech: Speech is delayed.        Behavior: Behavior is withdrawn.        Thought Content: Thought content is paranoid and delusional.        Judgment: Judgment is impulsive.     Review of Systems  Constitutional: Positive for fatigue. Negative for appetite change.  HENT: Negative for rhinorrhea and sore throat.   Eyes: Negative for photophobia and visual disturbance.  Respiratory: Negative for cough and shortness of breath.   Cardiovascular: Negative for chest pain and palpitations.  Gastrointestinal: Positive for nausea. Negative for constipation, diarrhea and vomiting.  Endocrine: Negative for cold intolerance and heat intolerance.  Genitourinary: Negative for difficulty urinating and dysuria.  Musculoskeletal: Negative for arthralgias and myalgias.  Skin: Negative for rash and wound.  Allergic/Immunologic: Negative for environmental allergies and food allergies.  Neurological: Negative for dizziness and light-headedness.  Hematological: Negative for adenopathy. Does not bruise/bleed easily.  Psychiatric/Behavioral: Positive for behavioral problems, hallucinations and sleep disturbance. The patient is nervous/anxious.     Blood pressure  117/88, pulse 79, temperature 97.7 F (36.5 C), temperature source Oral, resp. rate 17, height 5\' 11"  (1.803 m), weight 73.5 kg, SpO2 99 %.Body mass index is 22.59 kg/m.  General Appearance: Bizarre  Eye Contact:  Poor  Speech:  Blocked  Volume:  Decreased  Mood:  Anxious  Affect:  Restricted  Thought Process:  Coherent and Disorganized  Orientation:  Full (Time, Place, and Person)  Thought Content:  Illogical and Delusions  Suicidal Thoughts:  No  Homicidal Thoughts:  No  Memory:  NA  Judgement:  Impaired  Insight:  Lacking  Psychomotor Activity:  Decreased  Concentration:  Concentration: Poor and Attention Span: Poor  Recall:  NA  Fund of Knowledge:  NA  Language:  Fair  Akathisia:  No  Handed:  Right  AIMS (if indicated):     Assets:  Housing Physical Health Resilience Social Support  ADL's:  Impaired  Cognition:  Impaired,  Mild  Sleep:  Number of Hours: 6     Treatment Plan Summary: Daily contact with patient to assess and evaluate symptoms and progress in treatment and Medication management  Patient is a 25 year old male with the above-stated past psychiatric history who is seen in follow-up.  Chart reviewed. Patient discussed with nursing.Patient started oral Paliperidone  tolerated well.Patient continues to display bizarre behavior, socially withdrawn and internally-preoccupied. Will continue current antipsychotic.  Invega 234 mg IM injection given 10/08/20. Will completed loading dose with Invega 156 mg IM injection on 10/14/20. Ativan 1 mg TID for suspected catatonia.  Plan:  -continue inpatient psych admission; 15-minute checks; daily contact with patient to assess and evaluate symptoms and progress in treatment; psychoeducation.  -continue scheduled medications: . feeding supplement  237 mL Oral TID BM  . influenza vac split quadrivalent PF  0.5 mL Intramuscular Tomorrow-1000  . LORazepam  1 mg Oral TID  . multivitamin with minerals  1 tablet Oral Daily  .  paliperidone  6 mg Oral QHS    -continue PRN medications.  acetaminophen, alum & mag hydroxide-simeth, bismuth subsalicylate, hydrOXYzine, magnesium hydroxide, traZODone  -Pertinent Labs: no new labs ordered today   -patient`s EKG showed a sinus rhythm without prolonged QTc interval. This will be monitored as the patient will be on antipsychotic medication.    -Consults: No new consults placed since yesterday    -Disposition will be determined after the patient is stabilized. All necessary aftercare will be arranged prior to discharge Likely d/c home with outpatient psych follow-up.  -  I certify that the patient does need, on a daily basis, active treatment furnished directly by or requiring the supervision of inpatient psychiatric facility personnel.   [name], MD 10/08/2020, 2:26 PM

## 2020-10-08 NOTE — Plan of Care (Signed)
  Problem: Education: Goal: Will be free of psychotic symptoms Outcome: Not Progressing Goal: Knowledge of the prescribed therapeutic regimen will improve Outcome: Not Progressing   Problem: Safety: Goal: Ability to remain free from injury will improve Outcome: Progressing   Problem: Education: Goal: Knowledge of [place] [facility] Education information/materials will improve Outcome: Not Progressing

## 2020-10-08 NOTE — Tx Team (Signed)
Interdisciplinary Treatment and Diagnostic Plan Update  10/08/2020 Time of Session: 9:00AM Eric Pineda MRN: [id number]  Principal Diagnosis: <principal problem not specified>  Secondary Diagnoses: Active Problems:   Schizophrenia ([name])   Current Medications:  Current Facility-Administered Medications  Medication Dose Route Frequency Provider Last Rate Last Admin  . acetaminophen (TYLENOL) tablet 650 mg  650 mg Oral Q6H PRN Eric Fife, MD      . alum & mag hydroxide-simeth (MAALOX/MYLANTA) 200-200-20 MG/5ML suspension 30 mL  30 mL Oral Q4H PRN Paliy, Alisa, MD      . bismuth subsalicylate (PEPTO BISMOL) chewable tablet 524 mg  524 mg Oral Q1H PRN Eric Scarlet, MD      . feeding supplement (ENSURE ENLIVE / ENSURE PLUS) liquid 237 mL  237 mL Oral TID BM Paliy, Alisa, MD   237 mL at 10/07/20 0945  . hydrOXYzine (ATARAX/VISTARIL) tablet 25 mg  25 mg Oral TID PRN Eric Fife, MD   25 mg at 10/06/20 1726  . influenza vac split quadrivalent PF (FLUARIX) injection 0.5 mL  0.5 mL Intramuscular Tomorrow-1000 Paliy, Alisa, MD      . magnesium hydroxide (MILK OF MAGNESIA) suspension 30 mL  30 mL Oral Daily PRN Eric Fife, MD      . multivitamin with minerals tablet 1 tablet  1 tablet Oral Daily Eric Fife, MD   1 tablet at 10/07/20 0825  . paliperidone (INVEGA SUSTENNA) injection 234 mg  234 mg Intramuscular Once Eric Scarlet, MD      . paliperidone (INVEGA) 24 hr tablet 6 mg  6 mg Oral QHS Eric Fife, MD   6 mg at 10/07/20 2141  . traZODone (DESYREL) tablet 50 mg  50 mg Oral QHS PRN Eric Fife, MD       PTA Medications: Medications Prior to Admission  Medication Sig Dispense Refill Last Dose  . FLUoxetine (PROZAC) 20 MG capsule Take 1 capsule (20 mg total) by mouth daily. (Patient not taking: Reported on 10/04/2020) 7 capsule 0   . hydrOXYzine (ATARAX/VISTARIL) 25 MG tablet Take 1 tablet (25 mg total) by mouth 3 (three) times daily as needed for anxiety. (Patient not taking:  Reported on 10/04/2020) 21 tablet 0   . OLANZapine zydis (ZYPREXA) 20 MG disintegrating tablet Take 1 tablet (20 mg total) by mouth at bedtime. (Patient not taking: Reported on 10/04/2020) 7 tablet 0   . traZODone (DESYREL) 50 MG tablet Take 1 tablet (50 mg total) by mouth at bedtime as needed for sleep. 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   Physician Treatment Plan for Primary Diagnosis: <principal problem not specified> Long Term Goal(s): Improvement in symptoms so as ready for discharge Improvement in symptoms so as ready for discharge   Short Term Goals: Ability to identify changes in lifestyle to reduce recurrence of condition will improve Ability to verbalize feelings will improve Ability to disclose and discuss suicidal ideas Ability to demonstrate self-control will improve Ability to identify and develop effective coping behaviors will improve Ability to maintain clinical measurements within normal limits will improve Compliance with prescribed medications will improve Ability to identify triggers associated with substance abuse/mental health issues will improve Ability to identify changes in lifestyle to reduce recurrence of condition will improve Ability to verbalize feelings will  improve Ability to disclose and discuss suicidal ideas Ability to demonstrate self-control will improve Ability to identify and develop effective coping behaviors will improve Ability to maintain clinical measurements within normal limits will improve Compliance with prescribed medications will improve Ability to identify triggers associated with substance abuse/mental health issues will  improve  Medication Management: Evaluate patient's response, side effects, and tolerance of medication regimen.  Therapeutic Interventions: 1 to 1 sessions, Unit Group sessions and Medication administration.  Evaluation of Outcomes: Not Met  Physician Treatment Plan for Secondary Diagnosis: Active Problems:   Schizophrenia (Eric Pineda)  Long Term Goal(s): Improvement in symptoms so as ready for discharge Improvement in symptoms so as ready for discharge   Short Term Goals: Ability to identify changes in lifestyle to reduce recurrence of condition will improve Ability to verbalize feelings will improve Ability to disclose and discuss suicidal ideas Ability to demonstrate self-control will improve Ability to identify and develop effective coping behaviors will improve Ability to maintain clinical measurements within normal limits will improve Compliance with prescribed medications will improve Ability to identify triggers associated with substance abuse/mental health issues will improve Ability to identify changes in lifestyle to reduce recurrence of condition will improve Ability to verbalize feelings will improve Ability to disclose and discuss suicidal ideas Ability to demonstrate self-control will improve Ability to identify and develop effective coping behaviors will improve Ability to maintain clinical measurements within normal limits will improve Compliance with prescribed medications will improve Ability to identify triggers associated with substance abuse/mental health issues will improve     Medication Management: Evaluate patient's response, side effects, and tolerance of medication regimen.  Therapeutic Interventions: 1 to 1 sessions, Unit Group sessions and Medication administration.  Evaluation of Outcomes: Not Met   RN Treatment Plan for Primary Diagnosis: <principal problem not specified> Long Term Goal(s): Knowledge of disease and therapeutic regimen to maintain health  will improve  Short Term Goals: Ability to participate in decision making will improve, Ability to verbalize feelings will improve, Ability to identify and develop effective coping behaviors will improve and Compliance with prescribed medications will improve  Medication Management: RN will administer medications as ordered by provider, will assess and evaluate patient's response and provide education to patient for prescribed medication. RN will report any adverse and/or side effects to prescribing provider.  Therapeutic Interventions: 1 on 1 counseling sessions, Psychoeducation, Medication administration, Evaluate responses to treatment, Monitor vital signs and CBGs as ordered, Perform/monitor CIWA, COWS, AIMS and Fall Risk screenings as ordered, Perform wound care treatments as ordered.  Evaluation of Outcomes: Not Met   LCSW Treatment Plan for Primary Diagnosis: <principal problem not specified> Long Term Goal(s): Safe transition to appropriate next level of care at discharge, Engage patient in therapeutic group addressing interpersonal concerns.  Short Term Goals: Engage patient in aftercare planning with referrals and resources, Increase social support, Facilitate acceptance of mental health diagnosis and concerns, Identify triggers associated with mental health/substance abuse issues and Increase skills for wellness and recovery  Therapeutic Interventions: Assess for all discharge needs, 1 to 1 time with Social worker, Explore available resources and support systems, Assess for adequacy in community support network, Educate family and significant other(s) on suicide prevention, Complete Psychosocial Assessment, Interpersonal group therapy.  Evaluation of Outcomes: Not Met   Progress in Treatment: Attending groups: No. Participating in groups: No. Taking medication as prescribed: No. Toleration medication: No. Family/Significant other contact made: No, will contact:  when given  permission. Patient understands diagnosis: No. Discussing patient identified problems/goals  with staff: No. Medical problems stabilized or resolved: Yes. Denies suicidal/homicidal ideation: Yes. Issues/concerns per patient self-inventory: No. Other: None.  New problem(s) identified: No, Describe:  none.  New Short Term/Long Term Goal(s): elimination of symptoms of psychosis, medication management for mood stabilization; elimination of SI thoughts; development of comprehensive mental wellness/sobriety plan.  Patient Goals: Patient was invited to participated in the treatment team process but declined.   Discharge Plan or Barriers: CSW will assist patient with development of an appropriate discharge/aftercare plan.  Reason for Continuation of Hospitalization: Delusions  Hallucinations Medication stabilization Withdrawal symptoms  Estimated Length of Stay: 1-7 days  Attendees: Patient: Eric Pineda 10/08/2020 10:29 AM  Physician: Selina Cooley, MD 10/08/2020 10:29 AM  Nursing: Graceann Congress, RN 10/08/2020 10:29 AM  RN Care Manager: 10/08/2020 10:29 AM  Social Worker: Chalmers Guest. Guerry Bruin, MSW, [place], [place] 10/08/2020 10:29 AM  Recreational Therapist:  10/08/2020 10:29 AM  Other: Assunta Curtis, MSW, LCSW 10/08/2020 10:29 AM  Other:  10/08/2020 10:29 AM  Other: 10/08/2020 10:29 AM    Scribe for Treatment Team: Shirl Harris, LCSW 10/08/2020 10:29 AM

## 2020-10-08 NOTE — Progress Notes (Addendum)
Pt observed talking to self and bed frame with inappropriate giggling at intervals majority of this shift. Speech is soft, incoherent at times with periods of elective mutism. Unwilling to participate in unit activities including groups despite multiple prompts or get out of bed. Presents fidgety / anxious with frequent shifting in bed as well. Engaged in ritualistic, bizarre jumps and hand gestures in his room. Pt declined medications and meals on four separate attempts. Writer took his medication and ensure to his room to promote compliance. Pt denies SI, HI, AVH and pain when assessed "no" and continues with rambling speech. Refused to get out of bed despite multiple prompts. Pt compliant with medications and ensures when given in his room.  Emotional support and encouragement offered. Safety checks maintained at Q 15 minutes intervals. Scheduled medications given as ordered with verbal education. Encouraged pt to voice concerns, attend to his ADLs and increase his PO intake. Pt remains safe on unit. Denies concerns at this time. Tolerates fluids well when taken to his room. Safety maintained on unit.

## 2020-10-08 NOTE — [facility] Suicide Risk Assessment (Signed)
[facility] INPATIENT:  Family/Significant Other Suicide Prevention Education  Suicide Prevention Education:  Contact Attempts: Judee Clara. Father, [phone], (name of family member/significant other) has been identified by the patient as the family member/significant other with whom the patient will be residing, and identified as the person(s) who will aid the patient in the event of a mental health crisis.  With written consent from the patient, two attempts were made to provide suicide prevention education, prior to and/or following the patient's discharge.  We were unsuccessful in providing suicide prevention education.  A suicide education pamphlet was given to the patient to share with family/significant other.  Date and time of first attempt: 10/08/20 at 2:24PM Date and time of second attempt: Second attempt is needed.  CSW left HIPAA compliant voicemail.  Harden Mo 10/08/2020, 2:23 PM

## 2020-10-08 NOTE — [facility] Counselor (Signed)
Patient declining aftercare referrals.  CSW unclear at this time if patient has a guardian.   CSW notes that during the assessment completed patient displayed echolalia.   Penni Homans, MSW, LCSW 10/08/2020 4:05 PM

## 2020-10-08 NOTE — [facility] Group Notes (Signed)
LCSW Group Therapy Note  10/08/2020 2:09 PM  Type of Therapy/Topic:  Group Therapy:  Feelings about Diagnosis  Participation Level:  Did Not Attend   Description of Group:   This group will allow patients to explore their thoughts and feelings about diagnoses they have received. Patients will be guided to explore their level of understanding and acceptance of these diagnoses. Facilitator will encourage patients to process their thoughts and feelings about the reactions of others to their diagnosis and will guide patients in identifying ways to discuss their diagnosis with significant others in their lives. This group will be process-oriented, with patients participating in exploration of their own experiences, giving and receiving support, and processing challenge from other group members.   Therapeutic Goals: 1. Patient will demonstrate understanding of diagnosis as evidenced by identifying two or more symptoms of the disorder 2. Patient will be able to express two feelings regarding the diagnosis 3. Patient will demonstrate their ability to communicate their needs through discussion and/or role play  Summary of Patient Progress:  X  Therapeutic Modalities:   Cognitive Behavioral Therapy Brief Therapy Feelings Identification   Penni Homans, MSW, LCSW 10/08/2020 2:09 PM

## 2020-10-08 NOTE — [facility] Counselor (Signed)
Adult Comprehensive Assessment  Patient ID: Eric Pineda, male   DOB: [birth year], 25 y.o.   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, petroleum / Lack of resources (include bankruptcy): Pt reports "none" Housing / Lack of housing: Pt reports "none" Physical health (include injuries & life threatening diseases): Pt reports "diabetes and high blood pressure" Social relationships: Pt reports "none" Substance abuse: Pt reports "none" Bereavement / Loss: Pt reports "my grandfather, passed away a few years ago"   Living/Environment/Situation:  Living Arrangements: Parent, Other relatives Living conditions (as described by patient or guardian): Pt reports "it works" Who else lives in the home?: Pt reports "my parents and my siblings" How long has patient lived in current situation?: Pt reports "for a year".  What is atmosphere in current home: Comfortable, Loving, Supportive   Family History:  Marital status: Single Are you sexually active?: No What is your sexual orientation?: Pt reports "heterosexual" Does patient have children?: No   Childhood History:  By whom was/is the patient raised?: Both parents, Grandparents Description of patient's relationship with caregiver when they were a child: Pt reports "it was pretty good" Patient's description of current relationship with people who raised him/her: Pt reports "well" How were you disciplined when you got in trouble as a child/adolescent?: Pt reports "whoopings" Does patient have siblings?: Yes Number of Siblings: 3 Description of patient's current relationship  with siblings: Pt reports "casual" Did patient suffer any verbal/emotional/physical/sexual abuse as a child?: No Did patient suffer from severe childhood neglect?: Yes(Pt reports "both of my parents worked") Has patient ever been sexually abused/assaulted/raped as an adolescent or adult?: No Was the patient ever a victim of a crime or a disaster?: No Witnessed domestic violence?: No Has patient been effected by domestic violence as an adult?: No   Education:  Highest grade of school patient has completed: Pt reports "middle scool" Currently a student?: No Learning disability?: Yes What learning problems does patient have?: Pt reports "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 Alcohol/Substance Abuse Treatment Hx: Denies past history Has alcohol/substance abuse ever caused legal problems?: Yes(Pt reports "I went to jail and got fined")   Leisure/Recreation:   Do You Have Hobbies?: No  Strengths/Needs:   What is the patient's perception of their strengths?: "I read" Patient states these barriers may affect/interfere with their treatment: Pt denies. Patient states these barriers may affect their return to the community: Pt denies.  Discharge Plan:   Currently receiving community mental health services: No Patient states concerns and preferences for aftercare planning are: Pt is declining aftercare at this time. Patient states they will know when they are safe and ready for discharge when: "this was  just a  check up" Does patient have access to transportation?: Yes Does patient have financial barriers related to discharge medications?: Yes Patient description of barriers related to discharge medications: Chart indicates that patient does not have insurance. Will patient be returning to same living situation after discharge?: Yes  Summary/Recommendations:   Summary and Recommendations (to be completed by the evaluator): Patient is a 25 year old male [facility] [place] [place] [facility] Chart indicates that patient has a legal guardian, however, a review of the patient's chart does not produce any documentation of such.  CSW will attempt to clarify this.  Patient presents to the hospital following a report of bizarre behaviors patient declining to take his medications.  He has a primary diagnosis of Schizophrenia.  Recommendations include: crisis stabilization, therapeutic milieu, encourage group attendance and participation, medication management for detox/mood stabilization and development of comprehensive mental wellness/sobriety plan.  Harden Mo. 10/08/2020

## 2020-10-09 NOTE — Progress Notes (Signed)
[facility] MD Progress Note  10/09/2020 12:12 PM Early Steel  MRN:  [id number]  Principal Problem: Schizophrenia (HCC) Diagnosis: Principal Problem:   Schizophrenia (HCC) Active Problems:   Catatonia  Total Time spent with patient: 15 minutes  Mr. Blankley is a 25yo M with a history of Schizophrenia, who was admitted to [facility] unit due to exacerbation of psychosis in settings of non-compliance with psych medications.  Interval History Patient was seen today for re-evaluation.  Nursing reports no events overnight. The patient has minor issues with performing ADLs.  Patient has been medication compliant.    Subjective:   On assessment, patient participates in conversation minimally; he mostly gives "yes/no" answers. He reports "I am okay".  He says "no"   to questions about side effects from medications, depression, anxiety, suicidal thoughts, homicidal thoughts, auditory hallucinations, and visual hallucinations. He received Invega 234 mg IM injection yesterday, and tolerated well. He no longer endorses nausea. He continues to appear internally preoccupied, and has occasional inappropriate grin. Patient was observed with several symptoms of catatonia including echolalia, stereotypy, mannerisms, minimal oral intake, and selective mutism. Since starting Ativan he has been seen out of bed more often, and has been eating more at meals. Will continue Ativan at this time.    Labs: no new results for review.  Past Psychiatric History:  Psych Dx: schizophrenia; started about a year ago.  Has had 2 hospitalizations in the past year: in March 2021 to [facility] and in July 2021 to [facility].  Pasty med trials: olanzapine during his first hospitalization. Unknown what medication he was on for the second.  No known history of suicidal or violent behavior.  No known substance abuse history. He is not on any outpatient medications for last 4 months at least.  Past Medical History:  Past Medical History:   Diagnosis Date  . Bizarre behavior 12/02/2019   History reviewed. No pertinent surgical history. Family History: History reviewed. No pertinent family history. Family Psychiatric  History: unknown  Social History:  Social History   Substance and Sexual Activity  Alcohol Use None     Social History   Substance and Sexual Activity  Drug Use Not on file    Social History   Socioeconomic History  . Marital status: Significant Other    Spouse name: Not on file  . Number of children: Not on file  . Years of education: Not on file  . Highest education level: Not on file  Occupational History  . Not on file  Tobacco Use  . Smoking status: Never Smoker  . Smokeless tobacco: Never Used  Substance and Sexual Activity  . Alcohol use: Not on file  . Drug use: Not on file  . Sexual activity: Not on file  Other Topics Concern  . Not on file  Social History Narrative  . Not on file   Social Determinants of Health   Financial Resource Strain: Not on file  Food Insecurity: Not on file  Transportation Needs: Not on file  Physical Activity: Not on file  Stress: Not on file  Social Connections: Not on file   Additional Social History:                         Sleep: Fair  Appetite:  Fair  Current Medications: Current Facility-Administered Medications  Medication Dose Route Frequency Provider Last Rate Last Admin  . acetaminophen (TYLENOL) tablet 650 mg  650 mg Oral Q6H PRN Thalia Party, MD      .  alum & mag hydroxide-simeth (MAALOX/MYLANTA) 200-200-20 MG/5ML suspension 30 mL  30 mL Oral Q4H PRN Paliy, Alisa, MD      . bismuth subsalicylate (PEPTO BISMOL) chewable tablet 524 mg  524 mg Oral Q1H PRN Jesse SansFreeman, Manreet Kiernan M, MD      . feeding supplement (ENSURE ENLIVE / ENSURE PLUS) liquid 237 mL  237 mL Oral TID BM Paliy, Alisa, MD   237 mL at 10/08/20 1328  . hydrOXYzine (ATARAX/VISTARIL) tablet 25 mg  25 mg Oral TID PRN Thalia PartyPaliy, Alisa, MD   25 mg at 10/06/20 1726  .  influenza vac split quadrivalent PF (FLUARIX) injection 0.5 mL  0.5 mL Intramuscular Tomorrow-1000 Paliy, Alisa, MD      . LORazepam (ATIVAN) tablet 1 mg  1 mg Oral TID Jesse SansFreeman, Donell Sliwinski M, MD   1 mg at 10/09/20 0840  . magnesium hydroxide (MILK OF MAGNESIA) suspension 30 mL  30 mL Oral Daily PRN Thalia PartyPaliy, Alisa, MD      . multivitamin with minerals tablet 1 tablet  1 tablet Oral Daily Thalia PartyPaliy, Alisa, MD   1 tablet at 10/09/20 0846  . paliperidone (INVEGA) 24 hr tablet 6 mg  6 mg Oral QHS Thalia PartyPaliy, Alisa, MD   6 mg at 10/08/20 2123  . traZODone (DESYREL) tablet 50 mg  50 mg Oral QHS PRN Thalia PartyPaliy, Alisa, MD   50 mg at 10/08/20 2123    Lab Results:  No results found for this or any previous visit (from the past 48 hour(s)).  Blood Alcohol level:  Lab Results  Component Value Date   ETH <10 10/04/2020   ETH <10 04/16/2020    Metabolic Disorder Labs: Lab Results  Component Value Date   HGBA1C 5.1 12/03/2019   MPG 99.67 12/03/2019   No results found for: PROLACTIN Lab Results  Component Value Date   CHOL 171 10/04/2020   TRIG 61 10/04/2020   HDL 55 10/04/2020   CHOLHDL 3.1 10/04/2020   VLDL 12 10/04/2020   LDLCALC 104 (H) 10/04/2020    Physical Findings: AIMS: Facial and Oral Movements Muscles of Facial Expression: None, normal Lips and Perioral Area: None, normal Jaw: None, normal Tongue: None, normal,Extremity Movements Upper (arms, wrists, hands, fingers): None, normal Lower (legs, knees, ankles, toes): None, normal, Trunk Movements Neck, shoulders, hips: None, normal, Overall Severity Severity of abnormal movements (highest score from questions above): None, normal Incapacitation due to abnormal movements: None, normal Patient's awareness of abnormal movements (rate only patient's report): No Awareness, Dental Status Current problems with teeth and/or dentures?: No Does patient usually wear dentures?: No  CIWA:    COWS:     Musculoskeletal: Strength & Muscle Tone: within normal  limits Gait & Station: normal Patient leans: N/A  Psychiatric Specialty Exam: Physical Exam Vitals and nursing note reviewed.  Constitutional:      General: He is not in acute distress. HENT:     Head: Normocephalic and atraumatic.     Right Ear: External ear normal.     Left Ear: External ear normal.     Nose: Nose normal.     Mouth/Throat:     Mouth: Mucous membranes are moist.     Pharynx: Oropharynx is clear.  Eyes:     Extraocular Movements: Extraocular movements intact.     Conjunctiva/sclera: Conjunctivae normal.     Pupils: Pupils are equal, round, and reactive to light.  Cardiovascular:     Rate and Rhythm: Normal rate.     Pulses: Normal pulses.  Pulmonary:  Effort: Pulmonary effort is normal.     Breath sounds: Normal breath sounds.  Abdominal:     General: Abdomen is flat.     Palpations: Abdomen is soft.  Musculoskeletal:        General: No swelling. Normal range of motion.     Cervical back: Normal range of motion and neck supple.  Skin:    General: Skin is warm and dry.  Neurological:     General: No focal deficit present.     Mental Status: He is alert and oriented to person, place, and time.  Psychiatric:        Attention and Perception: He is inattentive. He perceives auditory hallucinations.        Mood and Affect: Mood is anxious. Affect is flat.        Speech: Speech is delayed.        Behavior: Behavior is withdrawn.        Thought Content: Thought content is paranoid and delusional.        Judgment: Judgment is impulsive.     Review of Systems  Constitutional: Positive for fatigue. Negative for appetite change.  HENT: Negative for rhinorrhea and sore throat.   Eyes: Negative for photophobia and visual disturbance.  Respiratory: Negative for cough and shortness of breath.   Cardiovascular: Negative for chest pain and palpitations.  Gastrointestinal: Positive for nausea. Negative for constipation, diarrhea and vomiting.  Endocrine:  Negative for cold intolerance and heat intolerance.  Genitourinary: Negative for difficulty urinating and dysuria.  Musculoskeletal: Negative for arthralgias and myalgias.  Skin: Negative for rash and wound.  Allergic/Immunologic: Negative for environmental allergies and food allergies.  Neurological: Negative for dizziness and light-headedness.  Hematological: Negative for adenopathy. Does not bruise/bleed easily.  Psychiatric/Behavioral: Positive for behavioral problems, hallucinations and sleep disturbance. The patient is nervous/anxious.     Blood pressure 108/78, pulse 76, temperature 97.6 F (36.4 C), temperature source Oral, resp. rate 17, height 5\' 11"  (1.803 m), weight 73.5 kg, SpO2 98 %.Body mass index is 22.59 kg/m.  General Appearance: Bizarre  Eye Contact:  Poor  Speech:  Blocked  Volume:  Decreased  Mood:  Anxious  Affect:  Restricted  Thought Process:  Coherent and Disorganized  Orientation:  Full (Time, Place, and Person)  Thought Content:  Illogical and Delusions  Suicidal Thoughts:  No  Homicidal Thoughts:  No  Memory:  NA  Judgement:  Impaired  Insight:  Lacking  Psychomotor Activity:  Decreased  Concentration:  Concentration: Poor and Attention Span: Poor  Recall:  NA  Fund of Knowledge:  NA  Language:  Fair  Akathisia:  No  Handed:  Right  AIMS (if indicated):     Assets:  Housing Physical Health Resilience Social Support  ADL's:  Impaired  Cognition:  Impaired,  Mild  Sleep:  Number of Hours: 6     Treatment Plan Summary: Daily contact with patient to assess and evaluate symptoms and progress in treatment and Medication management  Patient is a 25 year old male with the above-stated past psychiatric history who is seen in follow-up.  Chart reviewed. Patient discussed with nursing.Patient started oral Paliperidone  tolerated well.Patient continues to display bizarre behavior, socially withdrawn and internally-preoccupied. Will continue current  antipsychotic.  Invega 234 mg IM injection given 10/08/20. Will completed loading dose with Invega 156 mg IM injection on 10/14/20. Ativan 1 mg TID for suspected catatonia.  Plan:  -continue inpatient psych admission; 15-minute checks; daily contact with patient to assess and evaluate  symptoms and progress in treatment; psychoeducation.  -continue scheduled medications: . feeding supplement  237 mL Oral TID BM  . influenza vac split quadrivalent PF  0.5 mL Intramuscular Tomorrow-1000  . LORazepam  1 mg Oral TID  . multivitamin with minerals  1 tablet Oral Daily  . paliperidone  6 mg Oral QHS    -continue PRN medications.  acetaminophen, alum & mag hydroxide-simeth, bismuth subsalicylate, hydrOXYzine, magnesium hydroxide, traZODone  -Pertinent Labs: no new labs ordered today   -patient`s EKG showed a sinus rhythm without prolonged QTc interval. This will be monitored as the patient will be on antipsychotic medication.    -Consults: No new consults placed since yesterday    -Disposition will be determined after the patient is stabilized. All necessary aftercare will be arranged prior to discharge Likely d/c home with outpatient psych follow-up.  -  I certify that the patient does need, on a daily basis, active treatment furnished directly by or requiring the supervision of inpatient psychiatric facility personnel.   [name], MD 10/09/2020, 12:12 PM

## 2020-10-09 NOTE — Progress Notes (Signed)
Patient pleasant and cooperative. Continues to be preoccupied. Pt voiced no concerns or complaints, only asked what time was on the clock. Pt medication compliant. Slept most of shift. Continues with rituals. Encouragement and support provided. Safety checks maintained. Medications given as prescribed. Pt receptive and remains safe on unit with q 15 min checks.

## 2020-10-09 NOTE — [facility] Counselor (Signed)
CSW attempted to call and speak with the patient's father.  Again the call went directly to voicemail.  CSW called the pt's mother at [phone] in an effort to clarify if patient is his own guardian.  Mother reports that pt's father is in [place].  CSW attempted to identify if the patient was his own guardian and mother expressed confusion.  She requested the use of an interpreter, CSW informed that she would call back with one.    CSW has attempted to get an interpreter several times with limited success,  CSW will attempt again.  Penni Homans, MSW, LCSW 10/09/2020 11:19 AM

## 2020-10-09 NOTE — Progress Notes (Signed)
Recreation Therapy Notes  INPATIENT RECREATION TR PLAN  Patient Details Name: [name] MRN: [id number] DOB: 19-Aug-1996 Today's Date: 10/09/2020  Rec Therapy Plan Is patient appropriate for Therapeutic Recreation?: Yes Treatment times per week: at least 3 Estimated Length of Stay: 5-7 days TR Treatment/Interventions: Group participation (Comment)  Discharge Criteria Pt will be discharged from therapy if:: Discharged Treatment plan/goals/alternatives discussed and agreed upon by:: Patient/family  Discharge Summary     Eric Pineda 10/09/2020, 11:18 AM

## 2020-10-09 NOTE — Progress Notes (Signed)
Recreation Therapy Notes   Date: 10/09/2020  Time: 9:30 am   Location: Craft room     Behavioral response: N/A   Intervention Topic: Time Management   Discussion/Intervention: Patient did not attend group.   Clinical Observations/Feedback:  Patient did not attend group.   Aubrii Sharpless LRT/CTRS        Kervin Bones 10/09/2020 10:38 AM

## 2020-10-09 NOTE — [facility] Group Notes (Signed)
LCSW Group Therapy Note  10/09/2020 2:09 PM  Type of Therapy/Topic:  Group Therapy:  Emotion Regulation  Participation Level:  Did Not Attend   Description of Group:   The purpose of this group is to assist patients in learning to regulate negative emotions and experience positive emotions. Patients will be guided to discuss ways in which they have been vulnerable to their negative emotions. These vulnerabilities will be juxtaposed with experiences of positive emotions or situations, and patients will be challenged to use positive emotions to combat negative ones. Special emphasis will be placed on coping with negative emotions in conflict situations, and patients will process healthy conflict resolution skills.  Therapeutic Goals: 1. Patient will identify two positive emotions or experiences to reflect on in order to balance out negative emotions 2. Patient will label two or more emotions that they find the most difficult to experience 3. Patient will demonstrate positive conflict resolution skills through discussion and/or role plays  Summary of Patient Progress: Patient did not attend group despite encouraged participation.    Therapeutic Modalities:   Cognitive Behavioral Therapy Feelings Identification Dialectical Behavioral Therapy  Gwenevere Ghazi, MSW, [place], [place] 10/09/2020 2:09 PM

## 2020-10-09 NOTE — [facility] Suicide Risk Assessment (Signed)
[facility] INPATIENT:  Family/Significant Other Suicide Prevention Education  Suicide Prevention Education:  Contact Attempts: Eric Pineda. Father, [phone], has been identified by the patient as the family member/significant other with whom the patient will be residing, and identified as the person(s) who will aid the patient in the event of a mental health crisis.  With written consent from the patient, two attempts were made to provide suicide prevention education, prior to and/or following the patient's discharge.  We were unsuccessful in providing suicide prevention education.  A suicide education pamphlet was given to the patient to share with family/significant other.  Date and time of first attempt: 10/08/20 at 2:24PM Date and time of second attempt: 10/09/2020 at 11:20AM  Eric Pineda 10/09/2020, 11:20 AM

## 2020-10-09 NOTE — Progress Notes (Signed)
Recreation Therapy Notes  INPATIENT RECREATION THERAPY ASSESSMENT  Patient Details Name: Eric Pineda MRN: [id number] DOB: [birth year] Today's Date: 10/09/2020       Information Obtained From: Patient  Able to Participate in Assessment/Interview: Yes  Patient Presentation: Responsive,Withdrawn  Reason for Admission (Per Patient): Active Symptoms  Patient Stressors:    Coping Skills:   Other (Comment) (None)  Leisure Interests (2+):   (None)  Frequency of Recreation/Participation:    Awareness of Community Resources:     [employer]:     Current Use:    If no, Barriers?:    Expressed Interest in [employer] Information:    [place] of Residence:  [place]  Patient Main Form of Transportation:    Patient Strengths:  N/A  Patient Identified Areas of Improvement:  N/A  Patient Goal for Hospitalization:  None  Current SI (including self-harm):  No  Current HI:  No  Current AVH: No  Staff Intervention Plan: Collaborate with Interdisciplinary Treatment Team,Group Attendance  Consent to Intern Participation: N/A  Davonn Flanery 10/09/2020, 11:17 AM

## 2020-10-09 NOTE — [facility] Counselor (Signed)
CSW called the pt's mother Gus Height at [phone].  CSW utilized Presenter, broadcasting [phone].  Mother indicated that she completed IVC paperwork for the patient, she does NOT however, have guardianship, neither does the patient's father.    CSW explained that she will ask pt for permission to continue conversation with mother.    Penni Homans, MSW, LCSW 10/09/2020 11:45 AM

## 2020-10-09 NOTE — [facility] Group Notes (Signed)
[facility] Group Notes:  (Nursing/MHT/Case Management/Adjunct)  Date:  10/09/2020  Time:  10:06 PM  Type of Therapy:  Group Therapy  Participation Level:  Did Not Attend  Participation Quality:  Stay in the bathroom most of time.  Summary of Progress/Problems:  Eric Pineda 10/09/2020, 10:06 PM

## 2020-10-09 NOTE — Plan of Care (Signed)
Patient stayed in bed most of the shift.Unwilling to get up for breakfast,lunch and medications with multiple prompts.Patient states " I don't feel good " one time but did not elaborate on it. Patient denies SI,HI and AVH. Patient had dinner and visible in the milieu after dinner.Compliant with medications and Ensure.Support and encouragement given.

## 2020-10-10 NOTE — Progress Notes (Signed)
Pt awake in dayroom eating dinner at this time. Spent earlier part of this shift in bed. Refused to up for medication and meals. Required multiple prompts to get out bed for dinner. Encouraged to attend scheduled groups and attend to his ADLs but to no avail. Continues with hand rituals but observed with decreased episodes of auditory hallucinations as in talking loudly to self / things in room. Denies SI, HI, AVH and pain "no" but continues to talks / whispers to self.  Continued support and encouragement offered to pt throughout this shift. All medications administered as ordered with verbal education and effects monitored. Q 15 minutes safety checks maintained without outburst. Pt tolerates meals and medications well when offered. Pt did not attend scheduled groups despite multiple prompts. Safety maintained.

## 2020-10-10 NOTE — Progress Notes (Signed)
Pt noted in bathroom earlier in shift sitting on commode for over an hour. Could not get patient to get up. After several attempts writer asked vet to come to med room for pills, pt continued to walk to dayroom asking for snack. Pt in day room asking for colas and coffee and water refusing to come down and take meds. Pt finally agreed to take medications. Writer saw no ritualistic behavior this shift, only somewhat defiant. Denies any SI, HI, AVH. Encouragement and support provided. Medications given as prescribed. After much encouragement pt receptive. Pt remains safe on unit with q 15 min checks. Slept well in no distress.

## 2020-10-10 NOTE — Progress Notes (Signed)
[facility] MD Progress Note  10/10/2020 3:21 PM Eric Pineda  MRN:  [id number]  Principal Problem: Schizophrenia [facility]) Diagnosis: Principal Problem:   Schizophrenia (HCC) Active Problems:   Catatonia  Total Time spent with patient: 15 minutes  Eric Pineda is a 25yo M with a history of Schizophrenia, who was admitted to [facility] unit due to exacerbation of psychosis in settings of non-compliance with psych medications.  Interval History Patient was seen today for re-evaluation.  Nursing reports no events overnight. The patient has minor issues with performing ADLs.  Patient has been medication compliant.    Subjective:   On assessment, patient continues to participate minimally in conversation giving "yes/no" answers. He reports "I am tired".  He says "no"   to questions about side effects from medications, depression, anxiety, suicidal thoughts, homicidal thoughts, auditory hallucinations, and visual hallucinations. He received Invega 234 mg IM injection Jan 18th, and tolerated well.  He continues to appear internally preoccupied, and has occasional inappropriate grin.  Patient noted to have several symptoms of catatonia, some of which have improved with initiation of Ativan. He is no longer taking overly large, exaggerating steps when on the unit, or tying shoes that are not present. He is however, still having symptoms of mutism, decreased oral intake, and being motorically stuck in bathroom for two hours over night.     Labs: no new results for review.  Past Psychiatric History:  Psych Dx: schizophrenia; started about a year ago.  Has had 2 hospitalizations in the past year: in March 2021 to [facility] and in July 2021 to [facility].  Pasty med trials: olanzapine during his first hospitalization. Unknown what medication he was on for the second.  No known history of suicidal or violent behavior.  No known substance abuse history. He is not on any outpatient medications for last 4 months at  least.  Past Medical History:  Past Medical History:  Diagnosis Date  . Bizarre behavior 12/02/2019   History reviewed. No pertinent surgical history. Family History: History reviewed. No pertinent family history. Family Psychiatric  History: unknown  Social History:  Social History   Substance and Sexual Activity  Alcohol Use None     Social History   Substance and Sexual Activity  Drug Use Not on file    Social History   Socioeconomic History  . Marital status: Significant Other    Spouse name: Not on file  . Number of children: Not on file  . Years of education: Not on file  . Highest education level: Not on file  Occupational History  . Not on file  Tobacco Use  . Smoking status: Never Smoker  . Smokeless tobacco: Never Used  Substance and Sexual Activity  . Alcohol use: Not on file  . Drug use: Not on file  . Sexual activity: Not on file  Other Topics Concern  . Not on file  Social History Narrative  . Not on file   Social Determinants of Health   Financial Resource Strain: Not on file  Food Insecurity: Not on file  Transportation Needs: Not on file  Physical Activity: Not on file  Stress: Not on file  Social Connections: Not on file   Additional Social History:                         Sleep: Fair  Appetite:  Fair  Current Medications: Current Facility-Administered Medications  Medication Dose Route Frequency Provider Last Rate Last Admin  .  acetaminophen (TYLENOL) tablet 650 mg  650 mg Oral Q6H PRN Thalia Party, MD      . alum & mag hydroxide-simeth (MAALOX/MYLANTA) 200-200-20 MG/5ML suspension 30 mL  30 mL Oral Q4H PRN Paliy, Alisa, MD      . bismuth subsalicylate (PEPTO BISMOL) chewable tablet 524 mg  524 mg Oral Q1H PRN [name], MD      . feeding supplement (ENSURE ENLIVE / ENSURE PLUS) liquid 237 mL  237 mL Oral TID BM Paliy, Alisa, MD   237 mL at 10/10/20 1055  . hydrOXYzine (ATARAX/VISTARIL) tablet 25 mg  25 mg Oral TID  PRN Thalia Party, MD   25 mg at 10/06/20 1726  . influenza vac split quadrivalent PF (FLUARIX) injection 0.5 mL  0.5 mL Intramuscular Tomorrow-1000 Paliy, Alisa, MD      . LORazepam (ATIVAN) tablet 1 mg  1 mg Oral TID [name], MD   1 mg at 10/10/20 1224  . magnesium hydroxide (MILK OF MAGNESIA) suspension 30 mL  30 mL Oral Daily PRN Thalia Party, MD      . multivitamin with minerals tablet 1 tablet  1 tablet Oral Daily Thalia Party, MD   1 tablet at 10/10/20 0856  . paliperidone (INVEGA) 24 hr tablet 6 mg  6 mg Oral QHS Thalia Party, MD   6 mg at 10/09/20 2124  . traZODone (DESYREL) tablet 50 mg  50 mg Oral QHS PRN Thalia Party, MD   50 mg at 10/09/20 2124    Lab Results:  No results found for this or any previous visit (from the past 48 hour(s)).  Blood Alcohol level:  Lab Results  Component Value Date   ETH <10 10/04/2020   ETH <10 04/16/2020    Metabolic Disorder Labs: Lab Results  Component Value Date   HGBA1C 5.1 12/03/2019   MPG 99.67 12/03/2019   No results found for: PROLACTIN Lab Results  Component Value Date   CHOL 171 10/04/2020   TRIG 61 10/04/2020   HDL 55 10/04/2020   CHOLHDL 3.1 10/04/2020   VLDL 12 10/04/2020   LDLCALC 104 (H) 10/04/2020    Physical Findings: AIMS: Facial and Oral Movements Muscles of Facial Expression: None, normal Lips and Perioral Area: None, normal Jaw: None, normal Tongue: None, normal,Extremity Movements Upper (arms, wrists, hands, fingers): None, normal Lower (legs, knees, ankles, toes): None, normal, Trunk Movements Neck, shoulders, hips: None, normal, Overall Severity Severity of abnormal movements (highest score from questions above): None, normal Incapacitation due to abnormal movements: None, normal Patient's awareness of abnormal movements (rate only patient's report): No Awareness, Dental Status Current problems with teeth and/or dentures?: No Does patient usually wear dentures?: No  CIWA:    COWS:      Musculoskeletal: Strength & Muscle Tone: within normal limits Gait & Station: normal Patient leans: N/A  Psychiatric Specialty Exam: Physical Exam Vitals and nursing note reviewed.  Constitutional:      General: He is not in acute distress. HENT:     Head: Normocephalic and atraumatic.     Right Ear: External ear normal.     Left Ear: External ear normal.     Nose: Nose normal.     Mouth/Throat:     Mouth: Mucous membranes are moist.     Pharynx: Oropharynx is clear.  Eyes:     Extraocular Movements: Extraocular movements intact.     Conjunctiva/sclera: Conjunctivae normal.     Pupils: Pupils are equal, round, and reactive to light.  Cardiovascular:     Rate and Rhythm: Normal rate.     Pulses: Normal pulses.  Pulmonary:     Effort: Pulmonary effort is normal.     Breath sounds: Normal breath sounds.  Abdominal:     General: Abdomen is flat.     Palpations: Abdomen is soft.  Musculoskeletal:        General: No swelling. Normal range of motion.     Cervical back: Normal range of motion and neck supple.  Skin:    General: Skin is warm and dry.  Neurological:     General: No focal deficit present.     Mental Status: He is alert and oriented to person, place, and time.  Psychiatric:        Attention and Perception: He is inattentive. He perceives auditory hallucinations.        Mood and Affect: Mood is anxious. Affect is flat.        Speech: Speech is delayed.        Behavior: Behavior is withdrawn.        Thought Content: Thought content is paranoid and delusional.        Judgment: Judgment is impulsive.     Review of Systems  Constitutional: Positive for fatigue. Negative for appetite change.  HENT: Negative for rhinorrhea and sore throat.   Eyes: Negative for photophobia and visual disturbance.  Respiratory: Negative for cough and shortness of breath.   Cardiovascular: Negative for chest pain and palpitations.  Gastrointestinal: Positive for nausea. Negative  for constipation, diarrhea and vomiting.  Endocrine: Negative for cold intolerance and heat intolerance.  Genitourinary: Negative for difficulty urinating and dysuria.  Musculoskeletal: Negative for arthralgias and myalgias.  Skin: Negative for rash and wound.  Allergic/Immunologic: Negative for environmental allergies and food allergies.  Neurological: Negative for dizziness and light-headedness.  Hematological: Negative for adenopathy. Does not bruise/bleed easily.  Psychiatric/Behavioral: Positive for behavioral problems, hallucinations and sleep disturbance. The patient is nervous/anxious.     Blood pressure 112/83, pulse 91, temperature 97.8 F (36.6 C), temperature source Oral, resp. rate 17, height 5\' 11"  (1.803 m), weight 73.5 kg, SpO2 96 %.Body mass index is 22.59 kg/m.  General Appearance: Bizarre  Eye Contact:  Poor  Speech:  Blocked  Volume:  Decreased  Mood:  Anxious  Affect:  Restricted  Thought Process:  Coherent and Disorganized  Orientation:  Full (Time, Place, and Person)  Thought Content:  Illogical and Delusions  Suicidal Thoughts:  No  Homicidal Thoughts:  No  Memory:  NA  Judgement:  Impaired  Insight:  Lacking  Psychomotor Activity:  Decreased  Concentration:  Concentration: Poor and Attention Span: Poor  Recall:  NA  Fund of Knowledge:  NA  Language:  Fair  Akathisia:  No  Handed:  Right  AIMS (if indicated):     Assets:  Housing Physical Health Resilience Social Support  ADL's:  Impaired  Cognition:  Impaired,  Mild  Sleep:  Number of Hours: 6.45     Treatment Plan Summary: Daily contact with patient to assess and evaluate symptoms and progress in treatment and Medication management  Patient is a 25 year old male with the above-stated past psychiatric history who is seen in follow-up.  Chart reviewed. Patient discussed with nursing.Patient started oral Paliperidone  tolerated well.Patient continues to display bizarre behavior, socially  withdrawn and internally-preoccupied. Will continue current antipsychotic.  Invega 234 mg IM injection given 10/08/20. Will completed loading dose with Invega 156 mg IM injection on 10/14/20. Continue  Ativan 1 mg TID for catatonia.  Plan:  -continue inpatient psych admission; 15-minute checks; daily contact with patient to assess and evaluate symptoms and progress in treatment; psychoeducation.  -continue scheduled medications: . feeding supplement  237 mL Oral TID BM  . influenza vac split quadrivalent PF  0.5 mL Intramuscular Tomorrow-1000  . LORazepam  1 mg Oral TID  . multivitamin with minerals  1 tablet Oral Daily  . paliperidone  6 mg Oral QHS    -continue PRN medications.  acetaminophen, alum & mag hydroxide-simeth, bismuth subsalicylate, hydrOXYzine, magnesium hydroxide, traZODone  -Pertinent Labs: no new labs ordered today   -patient`s EKG showed a sinus rhythm without prolonged QTc interval. This will be monitored as the patient will be on antipsychotic medication.    -Consults: No new consults placed since yesterday    -Disposition will be determined after the patient is stabilized. All necessary aftercare will be arranged prior to discharge Likely d/c home with outpatient psych follow-up.  -  I certify that the patient does need, on a daily basis, active treatment furnished directly by or requiring the supervision of inpatient psychiatric facility personnel.   [name], MD 10/10/2020, 3:21 PM

## 2020-10-10 NOTE — [facility] Group Notes (Signed)
LCSW Group Therapy Note  10/10/2020 2:14 PM  Type of Therapy/Topic:  Group Therapy:  Balance in Life  Participation Level:  Did Not Attend  Description of Group:    This group will address the concept of balance and how it feels and looks when one is unbalanced. Patients will be encouraged to process areas in their lives that are out of balance and identify reasons for remaining unbalanced. Facilitators will guide patients in utilizing problem-solving interventions to address and correct the stressor making their life unbalanced. Understanding and applying boundaries will be explored and addressed for obtaining and maintaining a balanced life. Patients will be encouraged to explore ways to assertively make their unbalanced needs known to significant others in their lives, using other group members and facilitator for support and feedback.  Therapeutic Goals: 1. Patient will identify two or more emotions or situations they have that consume much of in their lives. 2. Patient will identify signs/triggers that life has become out of balance:  3. Patient will identify two ways to set boundaries in order to achieve balance in their lives:  4. Patient will demonstrate ability to communicate their needs through discussion and/or role plays  Summary of Patient Progress:      Therapeutic Modalities:   Cognitive Behavioral Therapy Solution-Focused Therapy Assertiveness Training  Penni Homans MSW, LCSW 10/10/2020 2:14 PM

## 2020-10-10 NOTE — Progress Notes (Signed)
Recreation Therapy Notes  Date: 10/10/2020  Time: 9:30 am   Location: Craft room     Behavioral response: N/A   Intervention Topic: Self-esteem   Discussion/Intervention: Patient did not attend group.   Clinical Observations/Feedback:  Patient did not attend group.   Skipper Dacosta LRT/CTRS        Angee Gupton 10/10/2020 11:34 AM

## 2020-10-10 NOTE — Progress Notes (Signed)
Patient in bed most of shift. Did not come out for meds, writer had to take meds to patient. Pt did come out for snack and went straight back to bed. Denies all.  Encouragement and support provided. Safety checks maintained. Medications given as prescribed. Pt receptive and remains safe on unit with q 15 min checks

## 2020-10-10 NOTE — Plan of Care (Signed)
  Problem: Education: Goal: Knowledge of the prescribed therapeutic regimen will improve Outcome: Progressing   Problem: Physical Regulation: Goal: Complications related to the disease process, condition or treatment will be avoided or minimized Outcome: Progressing   Problem: Safety: Goal: Ability to remain free from injury will improve Outcome: Progressing

## 2020-10-10 NOTE — Plan of Care (Signed)
  Problem: Education: Goal: Will be free of psychotic symptoms Outcome: Not Progressing   Problem: Education: Goal: Knowledge of disease or condition will improve Outcome: Not Progressing   Problem: Safety: Goal: Ability to remain free from injury will improve Outcome: Progressing

## 2020-10-11 DIAGNOSIS — F203 Undifferentiated schizophrenia: Secondary | ICD-10-CM | POA: Diagnosis not present

## 2020-10-11 NOTE — [facility] Counselor (Signed)
CSW spoke with patient regarding speaking with his mother and sister who have called multiple times and indicated that they would like to speak with social work.   Patient DECLINED.    Penni Homans, MSW, LCSW 10/11/2020 10:11 AM

## 2020-10-11 NOTE — Plan of Care (Signed)
Patient came to med room after breakfast for his medicine.After taking medicine patient asked " is there anything else I have this morning? " Patient makes more eye contact and no ritualistic movements noted. Patient did not get up for afternoon medicine even though patient states " I will come." Continues to have period of mutism and " yes or no " answer to questions. As per patient he took shower today. Support and encouragement given.

## 2020-10-11 NOTE — [facility] Group Notes (Signed)
LCSW Group Therapy Note °  °10/11/2020 2:05 PM ° °Type of Therapy and Topic:  Group Therapy:  Overcoming Obstacles °  °Participation Level:  Did Not Attend °  °Description of Group:   ° In this group patients will be encouraged to explore what they see as obstacles to their own wellness and recovery. They will be guided to discuss their thoughts, feelings, and behaviors related to these obstacles. The group will process together ways to cope with barriers, with attention given to specific choices patients can make. Each patient will be challenged to identify changes they are motivated to make in order to overcome their obstacles. This group will be process-oriented, with patients participating in exploration of their own experiences as well as giving and receiving support and challenge from other group members. °  °Therapeutic Goals: °1. Patient will identify personal and current obstacles as they relate to admission. °2. Patient will identify barriers that currently interfere with their wellness or overcoming obstacles.  °3. Patient will identify feelings, thought process and behaviors related to these barriers. °4. Patient will identify two changes they are willing to make to overcome these obstacles:  °  °  °Summary of Patient Progress °X °  °Therapeutic Modalities:   °Cognitive Behavioral Therapy °Solution Focused Therapy °Motivational Interviewing °Relapse Prevention Therapy ° °Sharesa Kemp, MSW, LCSW °10/11/2020 2:05 PM  ° ° °

## 2020-10-11 NOTE — Progress Notes (Signed)
[facility] MD Progress Note  10/11/2020 11:27 AM Eric Pineda  MRN:  [id number]  Principal Problem: Schizophrenia (HCC) Diagnosis: Principal Problem:   Schizophrenia (HCC) Active Problems:   Catatonia  Total Time spent with patient: 15 minutes  [name] is a 25yo M with a history of Schizophrenia, who was admitted to [facility] unit due to exacerbation of psychosis in settings of non-compliance with psych medications.  Interval History Patient was seen today for re-evaluation.  Nursing reports no events overnight. The patient has minor issues with performing ADLs.  Patient has been medication compliant when medications brought to him by nursing staff.    Subjective:   This morning patient observed walking laps around the unit. No exaggerated steps or ritual behavior noted. He as able to make eye contact with staff, and ate his breakfast. On assessment, he is still minimally conversive, but does give longer responses than before. He states that he is feeling fine. When attempting to engage him in history he states he lives at home with his mother and father. He denies having siblings, though he has a sister that has attempted to call several times. He says "yes" when asked if he works, but then refuses to answer any follow-up questions about what he does for work. He says "no"   to questions about side effects from medications, depression, anxiety, suicidal thoughts, homicidal thoughts, auditory hallucinations, and visual hallucinations. He received Invega 234 mg IM injection Jan 18th, and tolerated well.  He continues to appear internally preoccupied, and has occasional inappropriate grin.  Patient noted to have several symptoms of catatonia, some of which have improved with initiation of Ativan. He is no longer taking overly large, exaggerating steps when on the unit, or tying shoes that are not present. His oral intake is gradually increasing. He is however, still having symptoms of mutism, and being  motorically stuck in room over night.     Labs: no new results for review.  Past Psychiatric History:  Psych Dx: schizophrenia; started about a year ago.  Has had 2 hospitalizations in the past year: in March 2021 to [facility] and in July 2021 to [facility].  Pasty med trials: olanzapine during his first hospitalization. Unknown what medication he was on for the second.  No known history of suicidal or violent behavior.  No known substance abuse history. He is not on any outpatient medications for last 4 months at least.  Past Medical History:  Past Medical History:  Diagnosis Date   Bizarre behavior 12/02/2019   History reviewed. No pertinent surgical history. Family History: History reviewed. No pertinent family history. Family Psychiatric  History: unknown  Social History:  Social History   Substance and Sexual Activity  Alcohol Use None     Social History   Substance and Sexual Activity  Drug Use Not on file    Social History   Socioeconomic History   Marital status: Single    Spouse name: Not on file   Number of children: Not on file   Years of education: Not on file   Highest education level: Not on file  Occupational History   Not on file  Tobacco Use   Smoking status: Never Smoker   Smokeless tobacco: Never Used  Substance and Sexual Activity   Alcohol use: Not on file   Drug use: Not on file   Sexual activity: Not on file  Other Topics Concern   Not on file  Social History Narrative   Not on file  Social Determinants of Health   Financial Resource Strain: Not on file  Food Insecurity: Not on file  Transportation Needs: Not on file  Physical Activity: Not on file  Stress: Not on file  Social Connections: Not on file   Additional Social History:                         Sleep: Fair  Appetite:  Fair  Current Medications: Current Facility-Administered Medications  Medication Dose Route Frequency Provider Last Rate Last  Admin   acetaminophen (TYLENOL) tablet 650 mg  650 mg Oral Q6H PRN Paliy, Serina Cowper, MD       alum & mag hydroxide-simeth (MAALOX/MYLANTA) 200-200-20 MG/5ML suspension 30 mL  30 mL Oral Q4H PRN Paliy, Alisa, MD       bismuth subsalicylate (PEPTO BISMOL) chewable tablet 524 mg  524 mg Oral Q1H PRN [name], MD       feeding supplement (ENSURE ENLIVE / ENSURE PLUS) liquid 237 mL  237 mL Oral TID BM Paliy, Alisa, MD   237 mL at 10/10/20 2050   hydrOXYzine (ATARAX/VISTARIL) tablet 25 mg  25 mg Oral TID PRN Thalia Party, MD   25 mg at 10/06/20 1726   influenza vac split quadrivalent PF (FLUARIX) injection 0.5 mL  0.5 mL Intramuscular Tomorrow-1000 Paliy, Alisa, MD       LORazepam (ATIVAN) tablet 1 mg  1 mg Oral TID [name], MD   1 mg at 10/11/20 0802   magnesium hydroxide (MILK OF MAGNESIA) suspension 30 mL  30 mL Oral Daily PRN Thalia Party, MD       multivitamin with minerals tablet 1 tablet  1 tablet Oral Daily Thalia Party, MD   1 tablet at 10/11/20 0802   paliperidone (INVEGA) 24 hr tablet 6 mg  6 mg Oral QHS Paliy, Serina Cowper, MD   6 mg at 10/10/20 2051   traZODone (DESYREL) tablet 50 mg  50 mg Oral QHS PRN Thalia Party, MD   50 mg at 10/10/20 2051    Lab Results:  No results found for this or any previous visit (from the past 48 hour(s)).  Blood Alcohol level:  Lab Results  Component Value Date   ETH <10 10/04/2020   ETH <10 04/16/2020    Metabolic Disorder Labs: Lab Results  Component Value Date   HGBA1C 5.1 12/03/2019   MPG 99.67 12/03/2019   No results found for: PROLACTIN Lab Results  Component Value Date   CHOL 171 10/04/2020   TRIG 61 10/04/2020   HDL 55 10/04/2020   CHOLHDL 3.1 10/04/2020   VLDL 12 10/04/2020   LDLCALC 104 (H) 10/04/2020    Physical Findings: AIMS: Facial and Oral Movements Muscles of Facial Expression: None, normal Lips and Perioral Area: None, normal Jaw: None, normal Tongue: None, normal,Extremity Movements Upper (arms,  wrists, hands, fingers): None, normal Lower (legs, knees, ankles, toes): None, normal, Trunk Movements Neck, shoulders, hips: None, normal, Overall Severity Severity of abnormal movements (highest score from questions above): None, normal Incapacitation due to abnormal movements: None, normal Patient's awareness of abnormal movements (rate only patient's report): No Awareness, Dental Status Current problems with teeth and/or dentures?: No Does patient usually wear dentures?: No  CIWA:    COWS:     Musculoskeletal: Strength & Muscle Tone: within normal limits Gait & Station: normal Patient leans: N/A  Psychiatric Specialty Exam: Physical Exam Vitals and nursing note reviewed.  Constitutional:      General:  He is not in acute distress. HENT:     Head: Normocephalic and atraumatic.     Right Ear: External ear normal.     Left Ear: External ear normal.     Nose: Nose normal.     Mouth/Throat:     Mouth: Mucous membranes are moist.     Pharynx: Oropharynx is clear.  Eyes:     Extraocular Movements: Extraocular movements intact.     Conjunctiva/sclera: Conjunctivae normal.     Pupils: Pupils are equal, round, and reactive to light.  Cardiovascular:     Rate and Rhythm: Normal rate.     Pulses: Normal pulses.  Pulmonary:     Effort: Pulmonary effort is normal.     Breath sounds: Normal breath sounds.  Abdominal:     General: Abdomen is flat.     Palpations: Abdomen is soft.  Musculoskeletal:        General: No swelling. Normal range of motion.     Cervical back: Normal range of motion and neck supple.  Skin:    General: Skin is warm and dry.  Neurological:     General: No focal deficit present.     Mental Status: He is alert and oriented to person, place, and time.  Psychiatric:        Attention and Perception: He is inattentive. He perceives auditory hallucinations.        Mood and Affect: Mood is anxious. Affect is flat.        Speech: Speech is delayed.         Behavior: Behavior is withdrawn.        Thought Content: Thought content is paranoid and delusional.        Judgment: Judgment is impulsive.     Review of Systems  Constitutional: Positive for fatigue. Negative for appetite change.  HENT: Negative for rhinorrhea and sore throat.   Eyes: Negative for photophobia and visual disturbance.  Respiratory: Negative for cough and shortness of breath.   Cardiovascular: Negative for chest pain and palpitations.  Gastrointestinal: Negative for constipation, diarrhea, nausea and vomiting.  Endocrine: Negative for cold intolerance and heat intolerance.  Genitourinary: Negative for difficulty urinating and dysuria.  Musculoskeletal: Negative for arthralgias and myalgias.  Skin: Negative for rash and wound.  Allergic/Immunologic: Negative for environmental allergies and food allergies.  Neurological: Negative for dizziness and light-headedness.  Hematological: Negative for adenopathy. Does not bruise/bleed easily.  Psychiatric/Behavioral: Positive for behavioral problems, hallucinations and sleep disturbance. The patient is nervous/anxious.     Blood pressure 116/89, pulse 81, temperature 97.6 F (36.4 C), temperature source Oral, resp. rate 17, height 5\' 11"  (1.803 m), weight 73.5 kg, SpO2 100 %.Body mass index is 22.59 kg/m.  General Appearance: Bizarre  Eye Contact:  Poor  Speech:  Blocked  Volume:  Decreased  Mood:  Anxious  Affect:  Restricted  Thought Process:  Coherent and Disorganized  Orientation:  Full (Time, Place, and Person)  Thought Content:  Illogical and Delusions  Suicidal Thoughts:  No  Homicidal Thoughts:  No  Memory:  NA  Judgement:  Impaired  Insight:  Lacking  Psychomotor Activity:  Decreased  Concentration:  Concentration: Poor and Attention Span: Poor  Recall:  NA  Fund of Knowledge:  NA  Language:  Fair  Akathisia:  No  Handed:  Right  AIMS (if indicated):     Assets:  Housing Physical  Health Resilience Social Support  ADL's:  Impaired  Cognition:  Impaired,  Mild  Sleep:  Number of Hours: 5.75     Treatment Plan Summary: Daily contact with patient to assess and evaluate symptoms and progress in treatment and Medication management  Patient is a 25 year old male with the above-stated past psychiatric history who is seen in follow-up.  Chart reviewed. Patient discussed with nursing.Patient started oral Paliperidone  tolerated well.Patient continues to display bizarre behavior, socially withdrawn and internally-preoccupied. Will continue current antipsychotic.  Invega 234 mg IM injection given 10/08/20. Will completed loading dose with Invega 156 mg IM injection on 10/14/20. Continue Ativan 1 mg TID for catatonia.  Plan:  -continue inpatient psych admission; 15-minute checks; daily contact with patient to assess and evaluate symptoms and progress in treatment; psychoeducation.  -continue scheduled medications:  feeding supplement  237 mL Oral TID BM   influenza vac split quadrivalent PF  0.5 mL Intramuscular Tomorrow-1000   LORazepam  1 mg Oral TID   multivitamin with minerals  1 tablet Oral Daily   paliperidone  6 mg Oral QHS    -continue PRN medications.  acetaminophen, alum & mag hydroxide-simeth, bismuth subsalicylate, hydrOXYzine, magnesium hydroxide, traZODone  -Pertinent Labs: no new labs ordered today   -patient`s EKG showed a sinus rhythm without prolonged QTc interval. This will be monitored as the patient will be on antipsychotic medication.    -Consults: No new consults placed since yesterday    -Disposition will be determined after the patient is stabilized. All necessary aftercare will be arranged prior to discharge Likely d/c home with outpatient psych follow-up.  -  I certify that the patient does need, on a daily basis, active treatment furnished directly by or requiring the supervision of inpatient psychiatric facility personnel.   [name], MD 10/11/2020, 11:27 AM

## 2020-10-11 NOTE — Progress Notes (Signed)
Recreation Therapy Notes  Date: 10/11/2020  Time: 9:30 am   Location: Craft room     Behavioral response: N/A   Intervention Topic: Communication    Discussion/Intervention: Patient did not attend group.   Clinical Observations/Feedback:  Patient did not attend group.   Jenay Morici LRT/CTRS        Josefa [place] 10/11/2020 11:45 AM

## 2020-10-12 DIAGNOSIS — F203 Undifferentiated schizophrenia: Secondary | ICD-10-CM | POA: Diagnosis not present

## 2020-10-12 NOTE — [facility] Counselor (Signed)
CSW met with patient in his room while completing rounds. Patient expressed no needs or concerns at this time. When asked if he had considered aftercare options, patient appeared confused. CSW clarified by asking how he planned on maintaining his current medications. Patient reported "I appreciate the insight, but I believe in the bible . . . No booze or pills." Pt exhibts echolalia and repeats sentence fragments. CSW encouraged patient to aftercare options and informed him of arrival of dinner at this time.   Signed:  Durenda Hurt, MSW, [place], LCASA 10/12/2020 4:42 PM

## 2020-10-12 NOTE — [facility] Group Notes (Signed)
LCSW Group Therapy Note  10/12/2020 2:30 PM  Type of Therapy and Topic:  Group Therapy: Avoiding Self-Sabotaging and Enabling Behaviors  Participation Level:  Did Not Attend   Description of Group:   In this group, patients will learn how to identify obstacles, self-sabotaging and enabling behaviors, as well as: what are they, why do we do them and what needs these behaviors meet. Discuss unhealthy relationships and how to have positive healthy boundaries with those that sabotage and enable. Explore aspects of self-sabotage and enabling in yourself and how to limit these self-destructive behaviors in everyday life.   Therapeutic Goals: 1. Patient will identify one obstacle that relates to self-sabotage and enabling behaviors 2. Patient will identify one personal self-sabotaging or enabling behavior they did prior to admission 3. Patient will state a plan to change the above identified behavior 4. Patient will demonstrate ability to communicate their needs through discussion and/or role play.   Summary of Patient Progress: Patient did not attend group despite encouraged participation.   Therapeutic Modalities:   Cognitive Behavioral Therapy Person-Centered Therapy Motivational Interviewing   Gwenevere Ghazi, MSW, [place], [place] 10/12/2020 2:30 PM

## 2020-10-12 NOTE — Progress Notes (Signed)
D: Pt alert and oriented. Pt denies experiencing any anxiety/depression. Pt denies experiencing any pain at this time. Pt denies experiencing any SI/HI, or AVH at this time.   Medications had to be brought to pt. Pt only left room for meals.  A: Scheduled medications administered to pt, per MD orders. Support and encouragement provided. Frequent verbal contact made. Routine safety checks conducted q15 minutes.   R: No adverse drug reactions noted. Pt verbally contracts for safety at this time. Pt complaint with medications. Pt interacts minimally with others on the unit mostly staying in his room only coming out for meals. Pt remains safe at this time. Will continue to monitor.

## 2020-10-12 NOTE — Progress Notes (Signed)
[facility] MD Progress Note  10/12/2020 11:16 AM Eric Pineda  MRN:  [id number] Subjective: Follow-up for this young man with schizophrenia.  Patient seen and chart reviewed.  Case reviewed with nursing.  Patient has no complaints.  As usual he answers questions minimally.  Says he is feeling fine.  Denies any hallucinations.  I asked him if he had any thoughts or plans about discharge and he tells me no he just takes things day by day.  Grooming is adequate.  No aggression reported.  Seems to be generally compliant with medication. Principal Problem: Schizophrenia (HCC) Diagnosis: Principal Problem:   Schizophrenia (HCC) Active Problems:   Catatonia  Total Time spent with patient: 30 minutes  Past Psychiatric History: Past history of what appears to be likely schizophrenia with negative symptoms.  Past Medical History:  Past Medical History:  Diagnosis Date  . Bizarre behavior 12/02/2019   History reviewed. No pertinent surgical history. Family History: History reviewed. No pertinent family history. Family Psychiatric  History: See previous Social History:  Social History   Substance and Sexual Activity  Alcohol Use None     Social History   Substance and Sexual Activity  Drug Use Not on file    Social History   Socioeconomic History  . Marital status: Single    Spouse name: Not on file  . Number of children: Not on file  . Years of education: Not on file  . Highest education level: Not on file  Occupational History  . Not on file  Tobacco Use  . Smoking status: Never Smoker  . Smokeless tobacco: Never Used  Substance and Sexual Activity  . Alcohol use: Not on file  . Drug use: Not on file  . Sexual activity: Not on file  Other Topics Concern  . Not on file  Social History Narrative  . Not on file   Social Determinants of Health   Financial Resource Strain: Not on file  Food Insecurity: Not on file  Transportation Needs: Not on file  Physical Activity: Not on file   Stress: Not on file  Social Connections: Not on file   Additional Social History:                         Sleep: Fair  Appetite:  Fair  Current Medications: Current Facility-Administered Medications  Medication Dose Route Frequency Provider Last Rate Last Admin  . acetaminophen (TYLENOL) tablet 650 mg  650 mg Oral Q6H PRN Thalia Party, MD      . alum & mag hydroxide-simeth (MAALOX/MYLANTA) 200-200-20 MG/5ML suspension 30 mL  30 mL Oral Q4H PRN Paliy, Alisa, MD      . bismuth subsalicylate (PEPTO BISMOL) chewable tablet 524 mg  524 mg Oral Q1H PRN [name], MD      . feeding supplement (ENSURE ENLIVE / ENSURE PLUS) liquid 237 mL  237 mL Oral TID BM Paliy, Alisa, MD   237 mL at 10/12/20 0932  . hydrOXYzine (ATARAX/VISTARIL) tablet 25 mg  25 mg Oral TID PRN Thalia Party, MD   25 mg at 10/06/20 1726  . influenza vac split quadrivalent PF (FLUARIX) injection 0.5 mL  0.5 mL Intramuscular Tomorrow-1000 Paliy, Alisa, MD      . LORazepam (ATIVAN) tablet 1 mg  1 mg Oral TID [name], MD   1 mg at 10/12/20 7846  . magnesium hydroxide (MILK OF MAGNESIA) suspension 30 mL  30 mL Oral Daily PRN Thalia Party, MD      .  multivitamin with minerals tablet 1 tablet  1 tablet Oral Daily Thalia Party, MD   1 tablet at 10/12/20 0821  . paliperidone (INVEGA) 24 hr tablet 6 mg  6 mg Oral QHS Thalia Party, MD   6 mg at 10/11/20 2114  . traZODone (DESYREL) tablet 50 mg  50 mg Oral QHS PRN Thalia Party, MD   50 mg at 10/10/20 2051    Lab Results: No results found for this or any previous visit (from the past 48 hour(s)).  Blood Alcohol level:  Lab Results  Component Value Date   ETH <10 10/04/2020   ETH <10 04/16/2020    Metabolic Disorder Labs: Lab Results  Component Value Date   HGBA1C 5.1 12/03/2019   MPG 99.67 12/03/2019   No results found for: PROLACTIN Lab Results  Component Value Date   CHOL 171 10/04/2020   TRIG 61 10/04/2020   HDL 55 10/04/2020   CHOLHDL 3.1  10/04/2020   VLDL 12 10/04/2020   LDLCALC 104 (H) 10/04/2020    Physical Findings: AIMS: Facial and Oral Movements Muscles of Facial Expression: None, normal Lips and Perioral Area: None, normal Jaw: None, normal Tongue: None, normal,Extremity Movements Upper (arms, wrists, hands, fingers): None, normal Lower (legs, knees, ankles, toes): None, normal, Trunk Movements Neck, shoulders, hips: None, normal, Overall Severity Severity of abnormal movements (highest score from questions above): None, normal Incapacitation due to abnormal movements: None, normal Patient's awareness of abnormal movements (rate only patient's report): No Awareness, Dental Status Current problems with teeth and/or dentures?: No Does patient usually wear dentures?: No  CIWA:    COWS:     Musculoskeletal: Strength & Muscle Tone: within normal limits Gait & Station: normal Patient leans: N/A  Psychiatric Specialty Exam: Physical Exam Vitals and nursing note reviewed.  Constitutional:      Appearance: He is well-developed and well-nourished.  HENT:     Head: Normocephalic and atraumatic.  Eyes:     Conjunctiva/sclera: Conjunctivae normal.     Pupils: Pupils are equal, round, and reactive to light.  Cardiovascular:     Heart sounds: Normal heart sounds.  Pulmonary:     Effort: Pulmonary effort is normal.  Abdominal:     Palpations: Abdomen is soft.  Musculoskeletal:        General: Normal range of motion.     Cervical back: Normal range of motion.  Skin:    General: Skin is warm and dry.  Neurological:     General: No focal deficit present.     Mental Status: He is alert.  Psychiatric:        Attention and Perception: He is inattentive.        Mood and Affect: Affect is blunt.        Speech: Speech is delayed.        Behavior: Behavior is slowed.        Thought Content: Thought content does not include homicidal or suicidal ideation.        Cognition and Memory: Cognition is impaired.         Judgment: Judgment is impulsive.     Review of Systems  Constitutional: Negative.   HENT: Negative.   Eyes: Negative.   Respiratory: Negative.   Cardiovascular: Negative.   Gastrointestinal: Negative.   Musculoskeletal: Negative.   Skin: Negative.   Neurological: Negative.   Psychiatric/Behavioral: Negative.     Blood pressure (!) 133/99, pulse 67, temperature 97.6 F (36.4 C), temperature source Oral, resp. rate 17, height  5\' 11"  (1.803 m), weight 73.5 kg, SpO2 99 %.Body mass index is 22.59 kg/m.  General Appearance: Casual  Eye Contact:  Minimal  Speech:  Slow  Volume:  Decreased  Mood:  Euthymic  Affect:  Constricted  Thought Process:  Coherent  Orientation:  Full (Time, Place, and Person)  Thought Content:  Very concrete  Suicidal Thoughts:  No  Homicidal Thoughts:  No  Memory:  Immediate;   Fair Recent;   Poor Remote;   Poor  Judgement:  Impaired  Insight:  Shallow  Psychomotor Activity:  Decreased  Concentration:  Concentration: Poor  Recall:  Poor  Fund of Knowledge:  Fair  Language:  Fair  Akathisia:  No  Handed:  Right  AIMS (if indicated):     Assets:  Housing Physical Health Resilience  ADL's:  Impaired  Cognition:  Impaired,  Mild  Sleep:  Number of Hours: 5     Treatment Plan Summary: Medication management and Plan No change to medication today.  Encourage patient to be up out of his room and interacting with others.  I reviewed his vital signs.  Blood pressure this morning had an elevated diastolic but in general his blood pressures are not elevated so I will not be adding any medication.  , MD 10/12/2020, 11:16 AM

## 2020-10-12 NOTE — Progress Notes (Signed)
Patient is quiet and reserved. He is active on the unit observed watching TV and engaging with others.  He denied SI  HI AVH depression anxiety and pain at this encounter. He is med compliant and tolerated his meds without incident. He is safe on the unit with 15 minute safety checks and encouraged to come to staff with any concerns.        Cleo Butler-Nicholson, LPN

## 2020-10-12 NOTE — Plan of Care (Signed)
  Problem: Education: Goal: Ability to state activities that reduce stress will improve Outcome: Progressing   Problem: Education: Goal: Will be free of psychotic symptoms Outcome: Progressing Goal: Knowledge of the prescribed therapeutic regimen will improve Outcome: Progressing   Problem: Education: Goal: Knowledge of disease or condition will improve Outcome: Progressing   Problem: Health Behavior/Discharge Planning: Goal: Ability to identify changes in lifestyle to reduce recurrence of condition will improve Outcome: Progressing   Problem: Physical Regulation: Goal: Complications related to the disease process, condition or treatment will be avoided or minimized Outcome: Progressing   Problem: Safety: Goal: Ability to remain free from injury will improve Outcome: Progressing   Problem: Education: Goal: Knowledge of [facility] Education information/materials will improve Outcome: Progressing   

## 2020-10-13 DIAGNOSIS — F203 Undifferentiated schizophrenia: Secondary | ICD-10-CM | POA: Diagnosis not present

## 2020-10-13 NOTE — Progress Notes (Signed)
[facility] MD Progress Note  10/13/2020 1:15 PM Eric Pineda  MRN:  [id number] Subjective: Follow-up for this young man with schizophrenia.  Patient had no complaints today.  Denies hallucinations.  Today he said his mood was feeling a little better but he still did not engage in any real conversation.  After our talk he later came back to the office knocking on the door and asking me with his same blunted affect if I wanted to talk about "the program".  He did not seem to actually have anything particular he wanted to talk about and quickly changed the subject to wanting to know what time it was.  His hygiene is good but his affect is blunted and he seems undirected most of the time.  Appears to be tolerating medicine. Principal Problem: Schizophrenia (HCC) Diagnosis: Principal Problem:   Schizophrenia (HCC) Active Problems:   Catatonia  Total Time spent with patient: 30 minutes  Past Psychiatric History: History of previous presentations and hospitalizations with similar symptoms  Past Medical History:  Past Medical History:  Diagnosis Date  . Bizarre behavior 12/02/2019   History reviewed. No pertinent surgical history. Family History: History reviewed. No pertinent family history. Family Psychiatric  History: See prior Social History:  Social History   Substance and Sexual Activity  Alcohol Use None     Social History   Substance and Sexual Activity  Drug Use Not on file    Social History   Socioeconomic History  . Marital status: Single    Spouse name: Not on file  . Number of children: Not on file  . Years of education: Not on file  . Highest education level: Not on file  Occupational History  . Not on file  Tobacco Use  . Smoking status: Never Smoker  . Smokeless tobacco: Never Used  Substance and Sexual Activity  . Alcohol use: Not on file  . Drug use: Not on file  . Sexual activity: Not on file  Other Topics Concern  . Not on file  Social History Narrative  . Not  on file   Social Determinants of Health   Financial Resource Strain: Not on file  Food Insecurity: Not on file  Transportation Needs: Not on file  Physical Activity: Not on file  Stress: Not on file  Social Connections: Not on file   Additional Social History:                         Sleep: Fair  Appetite:  Fair  Current Medications: Current Facility-Administered Medications  Medication Dose Route Frequency Provider Last Rate Last Admin  . acetaminophen (TYLENOL) tablet 650 mg  650 mg Oral Q6H PRN Thalia Party, MD      . alum & mag hydroxide-simeth (MAALOX/MYLANTA) 200-200-20 MG/5ML suspension 30 mL  30 mL Oral Q4H PRN Paliy, Alisa, MD      . bismuth subsalicylate (PEPTO BISMOL) chewable tablet 524 mg  524 mg Oral Q1H PRN [name], MD      . feeding supplement (ENSURE ENLIVE / ENSURE PLUS) liquid 237 mL  237 mL Oral TID BM Paliy, Alisa, MD   237 mL at 10/13/20 1027  . hydrOXYzine (ATARAX/VISTARIL) tablet 25 mg  25 mg Oral TID PRN Thalia Party, MD   25 mg at 10/06/20 1726  . influenza vac split quadrivalent PF (FLUARIX) injection 0.5 mL  0.5 mL Intramuscular Tomorrow-1000 Paliy, Alisa, MD      . LORazepam (ATIVAN) tablet 1  mg  1 mg Oral TID [name], MD   1 mg at 10/13/20 0820  . magnesium hydroxide (MILK OF MAGNESIA) suspension 30 mL  30 mL Oral Daily PRN Thalia Party, MD      . multivitamin with minerals tablet 1 tablet  1 tablet Oral Daily Thalia Party, MD   1 tablet at 10/13/20 0819  . paliperidone (INVEGA) 24 hr tablet 6 mg  6 mg Oral QHS Thalia Party, MD   6 mg at 10/12/20 2131  . traZODone (DESYREL) tablet 50 mg  50 mg Oral QHS PRN Thalia Party, MD   50 mg at 10/12/20 2131    Lab Results: No results found for this or any previous visit (from the past 48 hour(s)).  Blood Alcohol level:  Lab Results  Component Value Date   ETH <10 10/04/2020   ETH <10 04/16/2020    Metabolic Disorder Labs: Lab Results  Component Value Date   HGBA1C 5.1  12/03/2019   MPG 99.67 12/03/2019   No results found for: PROLACTIN Lab Results  Component Value Date   CHOL 171 10/04/2020   TRIG 61 10/04/2020   HDL 55 10/04/2020   CHOLHDL 3.1 10/04/2020   VLDL 12 10/04/2020   LDLCALC 104 (H) 10/04/2020    Physical Findings: AIMS: Facial and Oral Movements Muscles of Facial Expression: None, normal Lips and Perioral Area: None, normal Jaw: None, normal Tongue: None, normal,Extremity Movements Upper (arms, wrists, hands, fingers): None, normal Lower (legs, knees, ankles, toes): None, normal, Trunk Movements Neck, shoulders, hips: None, normal, Overall Severity Severity of abnormal movements (highest score from questions above): None, normal Incapacitation due to abnormal movements: None, normal Patient's awareness of abnormal movements (rate only patient's report): No Awareness, Dental Status Current problems with teeth and/or dentures?: No Does patient usually wear dentures?: No  CIWA:    COWS:     Musculoskeletal: Strength & Muscle Tone: within normal limits Gait & Station: normal Patient leans: N/A  Psychiatric Specialty Exam: Physical Exam Vitals and nursing note reviewed.  Constitutional:      Appearance: He is well-developed and well-nourished.  HENT:     Head: Normocephalic and atraumatic.  Eyes:     Conjunctiva/sclera: Conjunctivae normal.     Pupils: Pupils are equal, round, and reactive to light.  Cardiovascular:     Heart sounds: Normal heart sounds.  Pulmonary:     Effort: Pulmonary effort is normal.  Abdominal:     Palpations: Abdomen is soft.  Musculoskeletal:        General: Normal range of motion.     Cervical back: Normal range of motion.  Skin:    General: Skin is warm and dry.  Neurological:     General: No focal deficit present.     Mental Status: He is alert.  Psychiatric:        Attention and Perception: He is inattentive.        Mood and Affect: Affect is blunt.        Speech: He is  noncommunicative. Speech is tangential.        Behavior: Behavior is slowed.        Thought Content: Thought content is not paranoid. Thought content does not include homicidal or suicidal ideation.        Cognition and Memory: Cognition is impaired.        Judgment: Judgment is inappropriate.     Review of Systems  Constitutional: Negative.   HENT: Negative.   Eyes: Negative.  Respiratory: Negative.   Cardiovascular: Negative.   Gastrointestinal: Negative.   Musculoskeletal: Negative.   Skin: Negative.   Neurological: Negative.   Psychiatric/Behavioral: Negative.     Blood pressure (!) 133/99, pulse 67, temperature 97.6 F (36.4 C), temperature source Oral, resp. rate 17, height 5\' 11"  (1.803 m), weight 73.5 kg, SpO2 99 %.Body mass index is 22.59 kg/m.  General Appearance: Casual  Eye Contact:  Fair  Speech:  Slow  Volume:  Decreased  Mood:  Euthymic  Affect:  Constricted  Thought Process:  Coherent  Orientation:  Full (Time, Place, and Person)  Thought Content:  Illogical  Suicidal Thoughts:  No  Homicidal Thoughts:  No  Memory:  Immediate;   Fair Recent;   Fair Remote;   Fair  Judgement:  Fair  Insight:  Fair  Psychomotor Activity:  Normal  Concentration:  Concentration: Fair  Recall:  of Knowledge:  Fair  Language:  Fair  Akathisia:  No  Handed:  Right  AIMS (if indicated):     Assets:  Desire for Improvement Housing Physical Health Resilience  ADL's:  Impaired  Cognition:  Impaired,  Mild  Sleep:  Number of Hours: 7.5     Treatment Plan Summary: Plan Tolerating medicine.  Seems to be pretty odd but may be near his baseline.  Not aggressive.  Very passive in his behavior.  No change to anything today.  Plan reviewed with patient.  [employer], MD 10/13/2020, 1:15 PM

## 2020-10-13 NOTE — Plan of Care (Signed)
Patient got out of bed for breakfast and morning medicine.Patient did not get up for lunch and refused medicine even at his bedside. After few minutes patient came to med room and took the medicine.Patient appears with a blunted affect and not able to make a logical conversation. Denies SI,HI and AVH. Personal hygiene maintained.Appetite and energy level good.Support and encouragement given.

## 2020-10-13 NOTE — Progress Notes (Signed)
Patient remains isolative but when engaged in conversation he denies hallucinations. He was complaint with medication regime. He denies depression and he denies anxiety. He is currently in bed resting at this time.  

## 2020-10-13 NOTE — [facility] Group Notes (Signed)
[facility] LCSW Group Therapy Note  Date/Time: 10/13/2020 @ 1pm  Type of Therapy/Topic:  Group Therapy:  Feelings about Diagnosis  Participation Level:  Did Not Attend   Mood: Did not attend    Description of Group:    This group will allow patients to explore their thoughts and feelings about diagnoses they have received. Patients will be guided to explore their level of understanding and acceptance of these diagnoses. Facilitator will encourage patients to process their thoughts and feelings about the reactions of others to their diagnosis, and will guide patients in identifying ways to discuss their diagnosis with significant others in their lives. This group will be process-oriented, with patients participating in exploration of their own experiences as well as giving and receiving support and challenge from other group members.   Therapeutic Goals: 1. Patient will demonstrate understanding of diagnosis as evidence by identifying two or more symptoms of the disorder:  2. Patient will be able to express two feelings regarding the diagnosis 3. Patient will demonstrate ability to communicate their needs through discussion and/or role plays  Summary of Patient Progress:    Patient did not attend group therapy today.     Therapeutic Modalities:   Cognitive Behavioral Therapy Brief Therapy Feelings Identification   Jalaine Riggenbach, LCSW 

## 2020-10-14 DIAGNOSIS — F203 Undifferentiated schizophrenia: Secondary | ICD-10-CM | POA: Diagnosis not present

## 2020-10-14 MED ORDER — PALIPERIDONE PALMITATE ER 156 MG/ML IM SUSY
156.0000 mg | PREFILLED_SYRINGE | Freq: Once | INTRAMUSCULAR | Status: AC
  Administered 2020-10-15: 156 mg via INTRAMUSCULAR
  Filled 2020-10-14: qty 1

## 2020-10-14 NOTE — Plan of Care (Signed)
Patient states " I will come or I will do " to staff for any prompting to get out of bed. Did not get up for lunch or any medications. Patients mother called without the password.Mom's number and password wrote in a paper and gave to patient.Patient did not called mother.Denies SI,HI and AVH. Support and encouragement given.

## 2020-10-14 NOTE — Progress Notes (Signed)
Recreation Therapy Notes  Date: 10/14/2020  Time: 9:30 am   Location: Craft room     Behavioral response: N/A   Intervention Topic: Goals    Discussion/Intervention: Patient did not attend group.   Clinical Observations/Feedback:  Patient did not attend group.   Krystiana Fornes LRT/CTRS        Manasvi Dickard 10/14/2020 11:00 AM

## 2020-10-14 NOTE — [facility] Group Notes (Signed)
LCSW Group Therapy Note   10/14/2020 1:43 PM  Type of Therapy and Topic:  Group Therapy:  Overcoming Obstacles   Participation Level:  Did Not Attend   Description of Group:    In this group patients will be encouraged to explore what they see as obstacles to their own wellness and recovery. They will be guided to discuss their thoughts, feelings, and behaviors related to these obstacles. The group will process together ways to cope with barriers, with attention given to specific choices patients can make. Each patient will be challenged to identify changes they are motivated to make in order to overcome their obstacles. This group will be process-oriented, with patients participating in exploration of their own experiences as well as giving and receiving support and challenge from other group members.   Therapeutic Goals: 1. Patient will identify personal and current obstacles as they relate to admission. 2. Patient will identify barriers that currently interfere with their wellness or overcoming obstacles.  3. Patient will identify feelings, thought process and behaviors related to these barriers. 4. Patient will identify two changes they are willing to make to overcome these obstacles:      Summary of Patient Progress X   Therapeutic Modalities:   Cognitive Behavioral Therapy Solution Focused Therapy Motivational Interviewing Relapse Prevention Therapy  Penni Homans, MSW, LCSW 10/14/2020 1:43 PM

## 2020-10-14 NOTE — Progress Notes (Signed)
[facility] MD Progress Note  10/14/2020 2:50 PM Jehad Bisono  MRN:  [id number]  Principal Problem: Schizophrenia (HCC) Diagnosis: Principal Problem:   Schizophrenia (HCC) Active Problems:   Catatonia  Total Time spent with patient: 15 minutes  Mr. Ledo is a 25yo M with a history of Schizophrenia, who was admitted to [facility] unit due to exacerbation of psychosis in settings of non-compliance with psych medications.  Interval History Patient was seen today for re-evaluation.  Nursing reports no events overnight. The patient has minor issues with performing ADLs.  Patient has been medication compliant when medications brought to him by nursing staff.    Subjective:   This morning patient observed eating breakfast. On exam, he is back in bed, and only responding in short sentences. He states he feels tired. He deneis SI/HI/AH/VH. He continues to have poor insight at this time. No exaggerated steps or ritual behavior noted. He as able to make eye contact with staff, and ate his breakfast.  He says "no"   to questions about side effects from medications, depression, anxiety, suicidal thoughts, homicidal thoughts, auditory hallucinations, and visual hallucinations. He received Invega 234 mg IM injection Jan 18th, and tolerated well.  He continues to appear internally preoccupied, and has occasional inappropriate grin. Will complete loading dose of Invega 156 mg IM injection today.    Labs: no new results for review.  Past Psychiatric History:  Psych Dx: schizophrenia; started about a year ago.  Has had 2 hospitalizations in the past year: in March 2021 to [facility] and in July 2021 to [facility].  Pasty med trials: olanzapine during his first hospitalization. Unknown what medication he was on for the second.  No known history of suicidal or violent behavior.  No known substance abuse history. He is not on any outpatient medications for last 4 months at least.  Past Medical History:  Past Medical History:   Diagnosis Date  . Bizarre behavior 12/02/2019   History reviewed. No pertinent surgical history. Family History: History reviewed. No pertinent family history. Family Psychiatric  History: unknown  Social History:  Social History   Substance and Sexual Activity  Alcohol Use None     Social History   Substance and Sexual Activity  Drug Use Not on file    Social History   Socioeconomic History  . Marital status: Single    Spouse name: Not on file  . Number of children: Not on file  . Years of education: Not on file  . Highest education level: Not on file  Occupational History  . Not on file  Tobacco Use  . Smoking status: Never Smoker  . Smokeless tobacco: Never Used  Substance and Sexual Activity  . Alcohol use: Not on file  . Drug use: Not on file  . Sexual activity: Not on file  Other Topics Concern  . Not on file  Social History Narrative  . Not on file   Social Determinants of Health   Financial Resource Strain: Not on file  Food Insecurity: Not on file  Transportation Needs: Not on file  Physical Activity: Not on file  Stress: Not on file  Social Connections: Not on file   Additional Social History:                         Sleep: Fair  Appetite:  Fair  Current Medications: Current Facility-Administered Medications  Medication Dose Route Frequency Provider Last Rate Last Admin  . acetaminophen (TYLENOL) tablet  650 mg  650 mg Oral Q6H PRN Thalia Party, MD      . alum & mag hydroxide-simeth (MAALOX/MYLANTA) 200-200-20 MG/5ML suspension 30 mL  30 mL Oral Q4H PRN Paliy, Alisa, MD      . bismuth subsalicylate (PEPTO BISMOL) chewable tablet 524 mg  524 mg Oral Q1H PRN [name], MD      . feeding supplement (ENSURE ENLIVE / ENSURE PLUS) liquid 237 mL  237 mL Oral TID BM Paliy, Alisa, MD   237 mL at 10/13/20 2220  . hydrOXYzine (ATARAX/VISTARIL) tablet 25 mg  25 mg Oral TID PRN Thalia Party, MD   25 mg at 10/06/20 1726  . influenza vac  split quadrivalent PF (FLUARIX) injection 0.5 mL  0.5 mL Intramuscular Tomorrow-1000 Paliy, Alisa, MD      . LORazepam (ATIVAN) tablet 1 mg  1 mg Oral TID [name], MD   1 mg at 10/14/20 1258  . magnesium hydroxide (MILK OF MAGNESIA) suspension 30 mL  30 mL Oral Daily PRN Thalia Party, MD      . multivitamin with minerals tablet 1 tablet  1 tablet Oral Daily Thalia Party, MD   1 tablet at 10/14/20 0755  . [START ON 10/15/2020] paliperidone (INVEGA SUSTENNA) injection 156 mg  156 mg Intramuscular Once [name], MD      . paliperidone (INVEGA) 24 hr tablet 6 mg  6 mg Oral QHS Thalia Party, MD   6 mg at 10/13/20 2132  . traZODone (DESYREL) tablet 50 mg  50 mg Oral QHS PRN Thalia Party, MD   50 mg at 10/13/20 2133    Lab Results:  No results found for this or any previous visit (from the past 48 hour(s)).  Blood Alcohol level:  Lab Results  Component Value Date   ETH <10 10/04/2020   ETH <10 04/16/2020    Metabolic Disorder Labs: Lab Results  Component Value Date   HGBA1C 5.1 12/03/2019   MPG 99.67 12/03/2019   No results found for: PROLACTIN Lab Results  Component Value Date   CHOL 171 10/04/2020   TRIG 61 10/04/2020   HDL 55 10/04/2020   CHOLHDL 3.1 10/04/2020   VLDL 12 10/04/2020   LDLCALC 104 (H) 10/04/2020    Physical Findings: AIMS: Facial and Oral Movements Muscles of Facial Expression: None, normal Lips and Perioral Area: None, normal Jaw: None, normal Tongue: None, normal,Extremity Movements Upper (arms, wrists, hands, fingers): None, normal Lower (legs, knees, ankles, toes): None, normal, Trunk Movements Neck, shoulders, hips: None, normal, Overall Severity Severity of abnormal movements (highest score from questions above): None, normal Incapacitation due to abnormal movements: None, normal Patient's awareness of abnormal movements (rate only patient's report): No Awareness, Dental Status Current problems with teeth and/or dentures?: No Does  patient usually wear dentures?: No  CIWA:    COWS:     Musculoskeletal: Strength & Muscle Tone: within normal limits Gait & Station: normal Patient leans: N/A  Psychiatric Specialty Exam: Physical Exam Vitals and nursing note reviewed.  Constitutional:      General: He is not in acute distress. HENT:     Head: Normocephalic and atraumatic.     Right Ear: External ear normal.     Left Ear: External ear normal.     Nose: Nose normal.     Mouth/Throat:     Mouth: Mucous membranes are moist.     Pharynx: Oropharynx is clear.  Eyes:     Extraocular Movements: Extraocular movements intact.  Conjunctiva/sclera: Conjunctivae normal.     Pupils: Pupils are equal, round, and reactive to light.  Cardiovascular:     Rate and Rhythm: Normal rate.     Pulses: Normal pulses.  Pulmonary:     Effort: Pulmonary effort is normal.     Breath sounds: Normal breath sounds.  Abdominal:     General: Abdomen is flat.     Palpations: Abdomen is soft.  Musculoskeletal:        General: No swelling. Normal range of motion.     Cervical back: Normal range of motion and neck supple.  Skin:    General: Skin is warm and dry.  Neurological:     General: No focal deficit present.     Mental Status: He is alert and oriented to person, place, and time.  Psychiatric:        Attention and Perception: He is inattentive. He perceives auditory hallucinations.        Mood and Affect: Mood is anxious. Affect is flat.        Speech: Speech is delayed.        Behavior: Behavior is withdrawn.        Thought Content: Thought content is paranoid and delusional.        Judgment: Judgment is impulsive.     Review of Systems  Constitutional: Positive for fatigue. Negative for appetite change.  HENT: Negative for rhinorrhea and sore throat.   Eyes: Negative for photophobia and visual disturbance.  Respiratory: Negative for cough and shortness of breath.   Cardiovascular: Negative for chest pain and  palpitations.  Gastrointestinal: Negative for constipation, diarrhea, nausea and vomiting.  Endocrine: Negative for cold intolerance and heat intolerance.  Genitourinary: Negative for difficulty urinating and dysuria.  Musculoskeletal: Negative for arthralgias and myalgias.  Skin: Negative for rash and wound.  Allergic/Immunologic: Negative for environmental allergies and food allergies.  Neurological: Negative for dizziness and light-headedness.  Hematological: Negative for adenopathy. Does not bruise/bleed easily.  Psychiatric/Behavioral: Positive for behavioral problems, hallucinations and sleep disturbance. The patient is nervous/anxious.     Blood pressure 107/74, pulse 81, temperature 97.6 F (36.4 C), temperature source Oral, resp. rate 17, height 5\' 11"  (1.803 m), weight 73.5 kg, SpO2 100 %.Body mass index is 22.59 kg/m.  General Appearance: Casual  Eye Contact:  Poor  Speech:  Blocked  Volume:  Decreased  Mood:  Anxious  Affect:  Restricted  Thought Process:  Coherent and Disorganized  Orientation:  Full (Time, Place, and Person)  Thought Content:  Illogical and Delusions  Suicidal Thoughts:  No  Homicidal Thoughts:  No  Memory:  NA  Judgement:  Impaired  Insight:  Lacking  Psychomotor Activity:  Decreased  Concentration:  Concentration: Poor and Attention Span: Poor  Recall:  NA  Fund of Knowledge:  NA  Language:  Fair  Akathisia:  No  Handed:  Right  AIMS (if indicated):     Assets:  Housing Physical Health Resilience Social Support  ADL's:  Impaired  Cognition:  Impaired,  Mild  Sleep:  Number of Hours: 7.5     Treatment Plan Summary: Daily contact with patient to assess and evaluate symptoms and progress in treatment and Medication management  Patient is a 25 year old male with the above-stated past psychiatric history who is seen in follow-up.  Chart reviewed. Patient discussed with nursing.Patient started oral Paliperidone  tolerated well.Patient  continues to display bizarre behavior, socially withdrawn and internally-preoccupied. Will continue current antipsychotic.  Invega 234 mg IM injection  given 10/08/20. Will completed loading dose with Invega 156 mg IM injection on 10/14/20. Continue Ativan 1 mg TID for catatonia.  Plan:  -continue inpatient psych admission; 15-minute checks; daily contact with patient to assess and evaluate symptoms and progress in treatment; psychoeducation.  -continue scheduled medications: . feeding supplement  237 mL Oral TID BM  . influenza vac split quadrivalent PF  0.5 mL Intramuscular Tomorrow-1000  . LORazepam  1 mg Oral TID  . multivitamin with minerals  1 tablet Oral Daily  . [START ON 10/15/2020] paliperidone  156 mg Intramuscular Once  . paliperidone  6 mg Oral QHS    -continue PRN medications.  acetaminophen, alum & mag hydroxide-simeth, bismuth subsalicylate, hydrOXYzine, magnesium hydroxide, traZODone  -Pertinent Labs: no new labs ordered today   -patient`s EKG showed a sinus rhythm without prolonged QTc interval. This will be monitored as the patient will be on antipsychotic medication.    -Consults: No new consults placed since yesterday    -Disposition will be determined after the patient is stabilized. All necessary aftercare will be arranged prior to discharge Likely d/c home with outpatient psych follow-up.  -  I certify that the patient does need, on a daily basis, active treatment furnished directly by or requiring the supervision of inpatient psychiatric facility personnel.   Jesse SansMegan M Myanna Ziesmer, MD 10/14/2020, 2:50 PM

## 2020-10-14 NOTE — [facility] Group Notes (Signed)
[facility] Group Notes:  (Nursing/MHT/Case Management/Adjunct)  Date:  10/14/2020  Time:  9:16 PM  Type of Therapy:  Group Therapy  Participation Level:  Did Not Attend  Summary of Progress/Problems:  Mayra Neer 10/14/2020, 9:16 PM

## 2020-10-14 NOTE — Plan of Care (Signed)
  Problem: Education: Goal: Ability to state activities that reduce stress will improve Outcome: Progressing   Problem: Education: Goal: Will be free of psychotic symptoms Outcome: Progressing Goal: Knowledge of the prescribed therapeutic regimen will improve Outcome: Progressing   Problem: Education: Goal: Knowledge of disease or condition will improve Outcome: Progressing   Problem: Health Behavior/Discharge Planning: Goal: Ability to identify changes in lifestyle to reduce recurrence of condition will improve Outcome: Progressing   Problem: Physical Regulation: Goal: Complications related to the disease process, condition or treatment will be avoided or minimized Outcome: Progressing   Problem: Safety: Goal: Ability to remain free from injury will improve Outcome: Progressing   Problem: Education: Goal: Knowledge of  General Education information/materials will improve Outcome: Progressing   

## 2020-10-14 NOTE — Tx Team (Signed)
Interdisciplinary Treatment and Diagnostic Plan Update  10/14/2020 Time of Session: 8:30AM Eric Pineda MRN: [id number]  Principal Diagnosis: Schizophrenia [facility])  Secondary Diagnoses: Principal Problem:   Schizophrenia (HCC) Active Problems:   Catatonia   Current Medications:  Current Facility-Administered Medications  Medication Dose Route Frequency Provider Last Rate Last Admin  . acetaminophen (TYLENOL) tablet 650 mg  650 mg Oral Q6H PRN Thalia Party, MD      . alum & mag hydroxide-simeth (MAALOX/MYLANTA) 200-200-20 MG/5ML suspension 30 mL  30 mL Oral Q4H PRN Paliy, Alisa, MD      . bismuth subsalicylate (PEPTO BISMOL) chewable tablet 524 mg  524 mg Oral Q1H PRN [name], MD      . feeding supplement (ENSURE ENLIVE / ENSURE PLUS) liquid 237 mL  237 mL Oral TID BM Paliy, Alisa, MD   237 mL at 10/13/20 2220  . hydrOXYzine (ATARAX/VISTARIL) tablet 25 mg  25 mg Oral TID PRN Thalia Party, MD   25 mg at 10/06/20 1726  . influenza vac split quadrivalent PF (FLUARIX) injection 0.5 mL  0.5 mL Intramuscular Tomorrow-1000 Paliy, Alisa, MD      . LORazepam (ATIVAN) tablet 1 mg  1 mg Oral TID [name], MD   1 mg at 10/14/20 0755  . magnesium hydroxide (MILK OF MAGNESIA) suspension 30 mL  30 mL Oral Daily PRN Thalia Party, MD      . multivitamin with minerals tablet 1 tablet  1 tablet Oral Daily Thalia Party, MD   1 tablet at 10/14/20 0755  . [START ON 10/15/2020] paliperidone (INVEGA SUSTENNA) injection 156 mg  156 mg Intramuscular Once [name], MD      . paliperidone (INVEGA) 24 hr tablet 6 mg  6 mg Oral QHS Thalia Party, MD   6 mg at 10/13/20 2132  . traZODone (DESYREL) tablet 50 mg  50 mg Oral QHS PRN Thalia Party, MD   50 mg at 10/13/20 2133   PTA Medications: Medications Prior to Admission  Medication Sig Dispense Refill Last Dose  . FLUoxetine (PROZAC) 20 MG capsule Take 1 capsule (20 mg total) by mouth daily. (Patient not taking: Reported on 10/04/2020) 7 capsule  0   . hydrOXYzine (ATARAX/VISTARIL) 25 MG tablet Take 1 tablet (25 mg total) by mouth 3 (three) times daily as needed for anxiety. (Patient not taking: Reported on 10/04/2020) 21 tablet 0   . OLANZapine zydis (ZYPREXA) 20 MG disintegrating tablet Take 1 tablet (20 mg total) by mouth at bedtime. (Patient not taking: Reported on 10/04/2020) 7 tablet 0   . traZODone (DESYREL) 50 MG tablet Take 1 tablet (50 mg total) by mouth at bedtime as needed for sleep. 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   Physician Treatment Plan for Primary Diagnosis: Schizophrenia (HCC) Long Term Goal(s): Improvement in symptoms so as ready for discharge Improvement in symptoms so as ready for discharge   Short Term Goals: Ability to identify changes in lifestyle to reduce recurrence of condition will improve Ability to verbalize feelings will improve Ability to disclose and discuss suicidal ideas Ability to demonstrate self-control will improve Ability to identify and develop effective coping behaviors will improve Ability to maintain clinical measurements within normal limits will improve Compliance with prescribed medications will improve  Ability to identify triggers associated with substance abuse/mental health issues will improve Ability to identify changes in lifestyle to reduce recurrence of condition will improve Ability to verbalize feelings will improve Ability to disclose and discuss suicidal ideas Ability to demonstrate self-control will improve Ability to identify and develop effective coping behaviors will improve Ability to maintain clinical measurements within normal limits will  improve Compliance with prescribed medications will improve Ability to identify triggers associated with substance abuse/mental health issues will improve  Medication Management: Evaluate patient's response, side effects, and tolerance of medication regimen.  Therapeutic Interventions: 1 to 1 sessions, Unit Group sessions and Medication administration.  Evaluation of Outcomes: Progressing  Physician Treatment Plan for Secondary Diagnosis: Principal Problem:   Schizophrenia (HCC) Active Problems:   Catatonia  Long Term Goal(s): Improvement in symptoms so as ready for discharge Improvement in symptoms so as ready for discharge   Short Term Goals: Ability to identify changes in lifestyle to reduce recurrence of condition will improve Ability to verbalize feelings will improve Ability to disclose and discuss suicidal ideas Ability to demonstrate self-control will improve Ability to identify and develop effective coping behaviors will improve Ability to maintain clinical measurements within normal limits will improve Compliance with prescribed medications will improve Ability to identify triggers associated with substance abuse/mental health issues will improve Ability to identify changes in lifestyle to reduce recurrence of condition will improve Ability to verbalize feelings will improve Ability to disclose and discuss suicidal ideas Ability to demonstrate self-control will improve Ability to identify and develop effective coping behaviors will improve Ability to maintain clinical measurements within normal limits will improve Compliance with prescribed medications will improve Ability to identify triggers associated with substance abuse/mental health issues will improve     Medication Management: Evaluate patient's response, side effects, and tolerance of medication regimen.  Therapeutic Interventions: 1 to 1 sessions, Unit Group sessions and Medication  administration.  Evaluation of Outcomes: Progressing   RN Treatment Plan for Primary Diagnosis: Schizophrenia (HCC) Long Term Goal(s): Knowledge of disease and therapeutic regimen to maintain health will improve  Short Term Goals: Ability to participate in decision making will improve, Ability to verbalize feelings will improve, Ability to identify and develop effective coping behaviors will improve and Compliance with prescribed medications will improve  Medication Management: RN will administer medications as ordered by provider, will assess and evaluate patient's response and provide education to patient for prescribed medication. RN will report any adverse and/or side effects to prescribing provider.  Therapeutic Interventions: 1 on 1 counseling sessions, Psychoeducation, Medication administration, Evaluate responses to treatment, Monitor vital signs and CBGs as ordered, Perform/monitor CIWA, COWS, AIMS and Fall Risk screenings as ordered, Perform wound care treatments as ordered.  Evaluation of Outcomes: Progressing   LCSW Treatment Plan for Primary Diagnosis: Schizophrenia (HCC) Long Term Goal(s): Safe transition to appropriate next level of care at discharge, Engage patient in therapeutic group addressing interpersonal concerns.  Short Term Goals: Engage patient in aftercare planning with referrals and resources, Increase social support, Facilitate acceptance of mental health diagnosis and concerns, Identify triggers associated with mental health/substance abuse issues and Increase skills for wellness and recovery  Therapeutic Interventions: Assess for all discharge needs, 1 to 1 time with Social worker, Explore available resources and support systems, Assess for adequacy in community support network, Educate family and significant other(s) on suicide prevention, Complete Psychosocial Assessment, Interpersonal group therapy.  Evaluation of Outcomes: Progressing   Progress in  Treatment: Attending groups: No. Participating in groups: No.  Taking medication as prescribed: No. Toleration medication: No. Family/Significant other contact made: Yes, individual(s) contacted:  SPE completed with pt.  Pt provided permission for CSW to speak with his father, however, father is in [place], Patient understands diagnosis: No. Discussing patient identified problems/goals with staff: No. Medical problems stabilized or resolved: Yes. Denies suicidal/homicidal ideation: Yes. Issues/concerns per patient self-inventory: No. Other: None.  New problem(s) identified: No, Describe:  none.  New Short Term/Long Term Goal(s): elimination of symptoms of psychosis, medication management for mood stabilization; elimination of SI thoughts; development of comprehensive mental wellness/sobriety plan.  Update 10/14/2020:  No changes at this time.   Patient Goals: Patient was invited to participated in the treatment team process but declined. Update 10/14/2020:  No changes at this time.   Discharge Plan or Barriers: CSW will assist patient with development of an appropriate discharge/aftercare plan.  Update 10/14/2020:  CSW will continue to assist patient in identifying aftercare plans.  At this time the patient is declining aftercare.  Patient is also declining SPE contact with collaterals and potentially could be homeless.  CSW will continue to assess the situation and identify the best ways to support the patient.   Reason for Continuation of Hospitalization: Delusions  Hallucinations Medication stabilization Withdrawal symptoms  Estimated Length of Stay: 1-7 days  Attendees: Patient:  10/14/2020 9:39 AM  Physician: Les Pou, MD 10/14/2020 9:39 AM  Nursing:  10/14/2020 9:39 AM  RN Care Manager: 10/14/2020 9:39 AM  Social Worker: Penni Homans, MSW, LCSW 10/14/2020 9:39 AM  Recreational Therapist:  10/14/2020 9:39 AM  Other: Juanita Laster" [place], LCSW 10/14/2020 9:39 AM  Other:   10/14/2020 9:39 AM  Other: 10/14/2020 9:39 AM    Scribe for Treatment Team: Harden Mo, LCSW 10/14/2020 9:39 AM

## 2020-10-14 NOTE — Progress Notes (Signed)
Patient remains isolative but when engaged in conversation he denies hallucinations. He was complaint with medication regime. He denies depression and he denies anxiety. He is currently in bed resting at this time.

## 2020-10-15 DIAGNOSIS — F203 Undifferentiated schizophrenia: Secondary | ICD-10-CM | POA: Diagnosis not present

## 2020-10-15 MED ORDER — LORAZEPAM 1 MG PO TABS
1.0000 mg | ORAL_TABLET | Freq: Two times a day (BID) | ORAL | Status: DC
  Administered 2020-10-16: 1 mg via ORAL
  Filled 2020-10-15: qty 1

## 2020-10-15 NOTE — Progress Notes (Signed)
[facility] MD Progress Note  10/15/2020 2:51 PM Jonh Mcqueary  MRN:  [id number]  Principal Problem: Schizophrenia (HCC) Diagnosis: Principal Problem:   Schizophrenia (HCC) Active Problems:   Catatonia  Total Time spent with patient: 30 minutes  Mr. Shetley is a 25yo M with a history of Schizophrenia, who was admitted to [facility] unit due to exacerbation of psychosis in settings of non-compliance with psych medications.  Interval History Patient was seen today for re-evaluation.  Nursing reports no events overnight. The patient has minor issues with performing ADLs.  Patient has been medication compliant when medications brought to him by nursing staff.    Subjective:   This morning patient lying in bed, and responding in short sentences. He states he feels tired. He denies SI/HI/AH/VH. He continues to have poor insight at this time. No remaining symptoms of catatonia noted. Will start to taper off Ativan. He completed loading dose of [place] today. He gave verbal permission to speak to his family today for discharge planning. It is unclear if he can return home.   Called sister, Manford Sprong, [phone]: No answer, no ability to leave a voicemail.   Called mother, Gus Height, [phone]. Updated on current medications and plan of care. She states she will be able to pick him up Thursday at [facility].   Labs: no new results for review.  Past Psychiatric History:  Psych Dx: schizophrenia; started about a year ago.  Has had 2 hospitalizations in the past year: in March 2021 to [facility] and in July 2021 to [facility].  Pasty med trials: olanzapine during his first hospitalization. Unknown what medication he was on for the second.  No known history of suicidal or violent behavior.  No known substance abuse history. He is not on any outpatient medications for last 4 months at least.  Past Medical History:  Past Medical History:  Diagnosis Date  . Bizarre behavior 12/02/2019   History  reviewed. No pertinent surgical history. Family History: History reviewed. No pertinent family history. Family Psychiatric  History: unknown  Social History:  Social History   Substance and Sexual Activity  Alcohol Use None     Social History   Substance and Sexual Activity  Drug Use Not on file    Social History   Socioeconomic History  . Marital status: Single    Spouse name: Not on file  . Number of children: Not on file  . Years of education: Not on file  . Highest education level: Not on file  Occupational History  . Not on file  Tobacco Use  . Smoking status: Never Smoker  . Smokeless tobacco: Never Used  Substance and Sexual Activity  . Alcohol use: Not on file  . Drug use: Not on file  . Sexual activity: Not on file  Other Topics Concern  . Not on file  Social History Narrative  . Not on file   Social Determinants of Health   Financial Resource Strain: Not on file  Food Insecurity: Not on file  Transportation Needs: Not on file  Physical Activity: Not on file  Stress: Not on file  Social Connections: Not on file   Additional Social History:                         Sleep: Fair  Appetite:  Fair  Current Medications: Current Facility-Administered Medications  Medication Dose Route Frequency Provider Last Rate Last Admin  . acetaminophen (TYLENOL) tablet 650 mg  650  mg Oral Q6H PRN Thalia Party, MD      . alum & mag hydroxide-simeth (MAALOX/MYLANTA) 200-200-20 MG/5ML suspension 30 mL  30 mL Oral Q4H PRN Paliy, Alisa, MD      . bismuth subsalicylate (PEPTO BISMOL) chewable tablet 524 mg  524 mg Oral Q1H PRN [name], MD      . feeding supplement (ENSURE ENLIVE / ENSURE PLUS) liquid 237 mL  237 mL Oral TID BM Thalia Party, MD   237 mL at 10/15/20 1003  . hydrOXYzine (ATARAX/VISTARIL) tablet 25 mg  25 mg Oral TID PRN Thalia Party, MD   25 mg at 10/06/20 1726  . influenza vac split quadrivalent PF (FLUARIX) injection 0.5 mL  0.5 mL  Intramuscular Tomorrow-1000 Paliy, Alisa, MD      . LORazepam (ATIVAN) tablet 1 mg  1 mg Oral TID [name], MD   1 mg at 10/15/20 1249  . magnesium hydroxide (MILK OF MAGNESIA) suspension 30 mL  30 mL Oral Daily PRN Thalia Party, MD      . multivitamin with minerals tablet 1 tablet  1 tablet Oral Daily Thalia Party, MD   1 tablet at 10/15/20 0907  . paliperidone (INVEGA) 24 hr tablet 6 mg  6 mg Oral QHS Thalia Party, MD   6 mg at 10/14/20 2117  . traZODone (DESYREL) tablet 50 mg  50 mg Oral QHS PRN Thalia Party, MD   50 mg at 10/13/20 2133    Lab Results:  No results found for this or any previous visit (from the past 48 hour(s)).  Blood Alcohol level:  Lab Results  Component Value Date   ETH <10 10/04/2020   ETH <10 04/16/2020    Metabolic Disorder Labs: Lab Results  Component Value Date   HGBA1C 5.1 12/03/2019   MPG 99.67 12/03/2019   No results found for: PROLACTIN Lab Results  Component Value Date   CHOL 171 10/04/2020   TRIG 61 10/04/2020   HDL 55 10/04/2020   CHOLHDL 3.1 10/04/2020   VLDL 12 10/04/2020   LDLCALC 104 (H) 10/04/2020    Physical Findings: AIMS: Facial and Oral Movements Muscles of Facial Expression: None, normal Lips and Perioral Area: None, normal Jaw: None, normal Tongue: None, normal,Extremity Movements Upper (arms, wrists, hands, fingers): None, normal Lower (legs, knees, ankles, toes): None, normal, Trunk Movements Neck, shoulders, hips: None, normal, Overall Severity Severity of abnormal movements (highest score from questions above): None, normal Incapacitation due to abnormal movements: None, normal Patient's awareness of abnormal movements (rate only patient's report): No Awareness, Dental Status Current problems with teeth and/or dentures?: No Does patient usually wear dentures?: No  CIWA:    COWS:     Musculoskeletal: Strength & Muscle Tone: within normal limits Gait & Station: normal Patient leans: N/A  Psychiatric  Specialty Exam: Physical Exam Vitals and nursing note reviewed.  Constitutional:      General: He is not in acute distress. HENT:     Head: Normocephalic and atraumatic.     Right Ear: External ear normal.     Left Ear: External ear normal.     Nose: Nose normal.     Mouth/Throat:     Mouth: Mucous membranes are moist.     Pharynx: Oropharynx is clear.  Eyes:     Extraocular Movements: Extraocular movements intact.     Conjunctiva/sclera: Conjunctivae normal.     Pupils: Pupils are equal, round, and reactive to light.  Cardiovascular:     Rate and  Rhythm: Normal rate.     Pulses: Normal pulses.  Pulmonary:     Effort: Pulmonary effort is normal.     Breath sounds: Normal breath sounds.  Abdominal:     General: Abdomen is flat.     Palpations: Abdomen is soft.  Musculoskeletal:        General: No swelling. Normal range of motion.     Cervical back: Normal range of motion and neck supple.  Skin:    General: Skin is warm and dry.  Neurological:     General: No focal deficit present.     Mental Status: He is alert and oriented to person, place, and time.  Psychiatric:        Attention and Perception: He is inattentive. He perceives auditory hallucinations.        Mood and Affect: Mood is anxious. Affect is flat.        Speech: Speech is delayed.        Behavior: Behavior is withdrawn.        Thought Content: Thought content is paranoid and delusional.        Judgment: Judgment is impulsive.     Review of Systems  Constitutional: Positive for fatigue. Negative for appetite change.  HENT: Negative for rhinorrhea and sore throat.   Eyes: Negative for photophobia and visual disturbance.  Respiratory: Negative for cough and shortness of breath.   Cardiovascular: Negative for chest pain and palpitations.  Gastrointestinal: Negative for constipation, diarrhea, nausea and vomiting.  Endocrine: Negative for cold intolerance and heat intolerance.  Genitourinary: Negative for  difficulty urinating and dysuria.  Musculoskeletal: Negative for arthralgias and myalgias.  Skin: Negative for rash and wound.  Allergic/Immunologic: Negative for environmental allergies and food allergies.  Neurological: Negative for dizziness and light-headedness.  Hematological: Negative for adenopathy. Does not bruise/bleed easily.  Psychiatric/Behavioral: Positive for behavioral problems, hallucinations and sleep disturbance. The patient is nervous/anxious.     Blood pressure 95/68, pulse 60, temperature 98.1 F (36.7 C), temperature source Oral, resp. rate 18, height 5\' 11"  (1.803 m), weight 73.5 kg, SpO2 98 %.Body mass index is 22.59 kg/m.  General Appearance: Casual  Eye Contact:  Poor  Speech:  Blocked  Volume:  Decreased  Mood:  Anxious  Affect:  Restricted  Thought Process:  Coherent and Disorganized  Orientation:  Full (Time, Place, and Person)  Thought Content:  Illogical and Delusions  Suicidal Thoughts:  No  Homicidal Thoughts:  No  Memory:  NA  Judgement:  Impaired  Insight:  Lacking  Psychomotor Activity:  Decreased  Concentration:  Concentration: Poor and Attention Span: Poor  Recall:  NA  Fund of Knowledge:  NA  Language:  Fair  Akathisia:  No  Handed:  Right  AIMS (if indicated):     Assets:  Housing Physical Health Resilience Social Support  ADL's:  Impaired  Cognition:  Impaired,  Mild  Sleep:  Number of Hours: 5.5     Treatment Plan Summary: Daily contact with patient to assess and evaluate symptoms and progress in treatment and Medication management  Patient is a 25 year old male with the above-stated past psychiatric history who is seen in follow-up.  Chart reviewed. Patient discussed with nursing.Patient started oral Paliperidone  tolerated well.Patient continues to display bizarre behavior, socially withdrawn and internally-preoccupied. Will continue current antipsychotic.  Invega 234 mg IM injection given 10/08/20.  Loading doseInvega 156 mg IM  injection completed today. Decrease Ativan 1 mg BID for catatonia.  Plan:  -continue inpatient psych  admission; 15-minute checks; daily contact with patient to assess and evaluate symptoms and progress in treatment; psychoeducation.  -continue scheduled medications: . feeding supplement  237 mL Oral TID BM  . influenza vac split quadrivalent PF  0.5 mL Intramuscular Tomorrow-1000  . LORazepam  1 mg Oral TID  . multivitamin with minerals  1 tablet Oral Daily  . paliperidone  6 mg Oral QHS    -continue PRN medications.  acetaminophen, alum & mag hydroxide-simeth, bismuth subsalicylate, hydrOXYzine, magnesium hydroxide, traZODone  -Pertinent Labs: no new labs ordered today   -patient`s EKG showed a sinus rhythm without prolonged QTc interval. This will be monitored as the patient will be on antipsychotic medication.    -Consults: No new consults placed since yesterday    -Disposition will be determined after the patient is stabilized. All necessary aftercare will be arranged prior to discharge Likely d/c home with outpatient psych follow-up.  -  I certify that the patient does need, on a daily basis, active treatment furnished directly by or requiring the supervision of inpatient psychiatric facility personnel.   [name], MD 10/15/2020, 2:51 PM

## 2020-10-15 NOTE — Progress Notes (Signed)
Patient has been isolative to his room this evening. Had to be prompted to get out of the bed to get his QHS medications.  He denies SI HI AVH depression anxiety and pain at this encounter.  He remains safe on the unit with 15 minute safety rounds and was encouraged to come to staff with any concerns.    Cleo Butler-Nicholson, LPN

## 2020-10-15 NOTE — [facility] Counselor (Signed)
CSW attempted to speak with the patient about speaking with mother and sister.  Pt again declined. Pt appeared to be awake, however would not respond to this CSW.  Penni Homans, MSW, LCSW 10/15/2020 9:45 AM

## 2020-10-15 NOTE — Plan of Care (Signed)
  Problem: Education: Goal: Will be free of psychotic symptoms Outcome: Not Progressing Goal: Knowledge of the prescribed therapeutic regimen will improve Outcome: Not Progressing   Problem: Education: Goal: Knowledge of disease or condition will improve Outcome: Not Progressing   Problem: Safety: Goal: Ability to remain free from injury will improve Outcome: Progressing

## 2020-10-15 NOTE — [facility] Group Notes (Signed)
LCSW Group Therapy Note  10/15/2020 2:12 PM  Type of Therapy/Topic:  Group Therapy:  Feelings about Diagnosis  Participation Level:  Did Not Attend   Description of Group:   This group will allow patients to explore their thoughts and feelings about diagnoses they have received. Patients will be guided to explore their level of understanding and acceptance of these diagnoses. Facilitator will encourage patients to process their thoughts and feelings about the reactions of others to their diagnosis and will guide patients in identifying ways to discuss their diagnosis with significant others in their lives. This group will be process-oriented, with patients participating in exploration of their own experiences, giving and receiving support, and processing challenge from other group members.   Therapeutic Goals: 1. Patient will demonstrate understanding of diagnosis as evidenced by identifying two or more symptoms of the disorder 2. Patient will be able to express two feelings regarding the diagnosis 3. Patient will demonstrate their ability to communicate their needs through discussion and/or role play  Summary of Patient Progress: X   Therapeutic Modalities:   Cognitive Behavioral Therapy Brief Therapy Feelings Identification   Eric Pineda R. [name], MSW, LCSW, LCAS 10/15/2020 2:12 PM

## 2020-10-15 NOTE — Plan of Care (Signed)
Patient stayed in bed except for meals.Compliant with medications given to his room. Denies SI,HI and AVH.Minimal interactions with staff.Support and encouragement given.

## 2020-10-15 NOTE — Progress Notes (Signed)
Patient isolative to self and room, comes out periodically with no interaction with others. Medication compliant, only if meds taken to room. Voiced no concerns or complaints. Only answers yes or no to questions. Denies any SI, HI, AVH. Encouragement and support provided. Safety checks maintained. Medications given as prescribed. Pt receptive and remains safe on unit with q 15 min checks.

## 2020-10-15 NOTE — Progress Notes (Signed)
Recreation Therapy Notes    Date: 10/15/2020  Time: 9:30 am   Location: Craft room     Behavioral response: N/A   Intervention Topic: Stress   Discussion/Intervention: Patient did not attend group.   Clinical Observations/Feedback:  Patient did not attend group.   Ramonte Mena LRT/CTRS        Luz Burcher 10/15/2020 11:42 AM

## 2020-10-16 DIAGNOSIS — F203 Undifferentiated schizophrenia: Secondary | ICD-10-CM | POA: Diagnosis not present

## 2020-10-16 MED ORDER — LORAZEPAM 1 MG PO TABS: 1.0000 mg | ORAL_TABLET | Freq: Three times a day (TID) | ORAL | Status: DC

## 2020-10-16 MED ORDER — LORAZEPAM 1 MG PO TABS
1.0000 mg | ORAL_TABLET | Freq: Two times a day (BID) | ORAL | Status: DC
  Administered 2020-10-16 – 2020-10-17 (×3): 1 mg via ORAL
  Filled 2020-10-16 (×3): qty 1

## 2020-10-16 NOTE — [facility] Counselor (Signed)
CSW met with the patient to discuss aftercare plans.  Pt declined aftercare referrals.  CSW pointed out that it is recommended that patient follow up with care.  CSW also recommended ACTT services for the patient.  Assunta Curtis, MSW, LCSW 10/16/2020 2:13 PM

## 2020-10-16 NOTE — Plan of Care (Signed)
  Problem: Group Participation Goal: STG - Patient will engage in groups without prompting or encouragement from LRT x3 group sessions within 5 recreation therapy group sessions Description: STG - Patient will engage in groups without prompting or encouragement from LRT x3 group sessions within 5 recreation therapy group sessions Outcome: Not Progressing   

## 2020-10-16 NOTE — Progress Notes (Signed)
D: Pt alert and oriented. Pt denies experiencing any anxiety/depression at this time. Pt denies experiencing any pain at this time. Pt denies experiencing any SI/HI, or AVH at this time.   A: Scheduled medications administered to pt, per MD orders. Support and encouragement provided. Frequent verbal contact made. Routine safety checks conducted q15 minutes.   R: No adverse drug reactions noted. Pt verbally contracts for safety at this time. Pt complaint with medications. Pt interacts minimally with others on the unit mostly staying in his room. Pt has been observed out during meals and in the hallway occasionally walking around. Pt remains safe at this time. Will continue to monitor.

## 2020-10-16 NOTE — Progress Notes (Signed)
[facility] MD Progress Note  10/16/2020 12:43 PM Eric Pineda  MRN:  [id number]  Principal Problem: Schizophrenia (HCC) Diagnosis: Principal Problem:   Schizophrenia (HCC) Active Problems:   Catatonia  Total Time spent with patient: 30 minutes  [name] is a 25yo M with a history of Schizophrenia, who was admitted to [facility] unit due to exacerbation of psychosis in settings of non-compliance with psych medications.  Interval History Patient was seen today for re-evaluation.  Nursing reports no events overnight. The patient has no issues with performing ADLs.  Patient has been medication compliant when medications brought to him by nursing staff.    Subjective:   Today, patient took the initiative to leave his room and knock on office door. He requests to speak with me this morning, and speaks in full sentences for the first time this admission. He states he is feeling much better today, and would like to discuss his discharge plan. Informed him I was able to speak to his mother yesterday, and she would be able to pick him up tomorrow. He expresses appreciation of this. He also jokes that his stomach is getting big from all the food here. Today he maintains eye contact and shows appropriate affect. He denies suicidal ideations, homicidal ideations, visual hallucinations, and auditory hallucinations. This morning patient lying in bed, and responding in short sentences.  Labs: no new results for review.  Past Psychiatric History:  Psych Dx: schizophrenia; started about a year ago.  Has had 2 hospitalizations in the past year: in March 2021 to [facility] and in July 2021 to [facility].  Pasty med trials: olanzapine during his first hospitalization. Unknown what medication he was on for the second.  No known history of suicidal or violent behavior.  No known substance abuse history. He is not on any outpatient medications for last 4 months at least.  Past Medical History:  Past Medical History:   Diagnosis Date  . Bizarre behavior 12/02/2019   History reviewed. No pertinent surgical history. Family History: History reviewed. No pertinent family history. Family Psychiatric  History: unknown  Social History:  Social History   Substance and Sexual Activity  Alcohol Use None     Social History   Substance and Sexual Activity  Drug Use Not on file    Social History   Socioeconomic History  . Marital status: Single    Spouse name: Not on file  . Number of children: Not on file  . Years of education: Not on file  . Highest education level: Not on file  Occupational History  . Not on file  Tobacco Use  . Smoking status: Never Smoker  . Smokeless tobacco: Never Used  Substance and Sexual Activity  . Alcohol use: Not on file  . Drug use: Not on file  . Sexual activity: Not on file  Other Topics Concern  . Not on file  Social History Narrative  . Not on file   Social Determinants of Health   Financial Resource Strain: Not on file  Food Insecurity: Not on file  Transportation Needs: Not on file  Physical Activity: Not on file  Stress: Not on file  Social Connections: Not on file   Additional Social History:                         Sleep: Fair  Appetite:  Fair  Current Medications: Current Facility-Administered Medications  Medication Dose Route Frequency Provider Last Rate Last Admin  .  acetaminophen (TYLENOL) tablet 650 mg  650 mg Oral Q6H PRN Thalia Party, MD      . alum & mag hydroxide-simeth (MAALOX/MYLANTA) 200-200-20 MG/5ML suspension 30 mL  30 mL Oral Q4H PRN Paliy, Alisa, MD      . bismuth subsalicylate (PEPTO BISMOL) chewable tablet 524 mg  524 mg Oral Q1H PRN [name], MD      . feeding supplement (ENSURE ENLIVE / ENSURE PLUS) liquid 237 mL  237 mL Oral TID BM Thalia Party, MD   237 mL at 10/16/20 0950  . hydrOXYzine (ATARAX/VISTARIL) tablet 25 mg  25 mg Oral TID PRN Thalia Party, MD   25 mg at 10/06/20 1726  . influenza vac  split quadrivalent PF (FLUARIX) injection 0.5 mL  0.5 mL Intramuscular Tomorrow-1000 Paliy, Alisa, MD      . LORazepam (ATIVAN) tablet 1 mg  1 mg Oral TID [name], MD      . magnesium hydroxide (MILK OF MAGNESIA) suspension 30 mL  30 mL Oral Daily PRN Thalia Party, MD      . multivitamin with minerals tablet 1 tablet  1 tablet Oral Daily Thalia Party, MD   1 tablet at 10/16/20 0749  . paliperidone (INVEGA) 24 hr tablet 6 mg  6 mg Oral QHS Thalia Party, MD   6 mg at 10/15/20 2113  . traZODone (DESYREL) tablet 50 mg  50 mg Oral QHS PRN Thalia Party, MD   50 mg at 10/13/20 2133    Lab Results:  No results found for this or any previous visit (from the past 48 hour(s)).  Blood Alcohol level:  Lab Results  Component Value Date   ETH <10 10/04/2020   ETH <10 04/16/2020    Metabolic Disorder Labs: Lab Results  Component Value Date   HGBA1C 5.1 12/03/2019   MPG 99.67 12/03/2019   No results found for: PROLACTIN Lab Results  Component Value Date   CHOL 171 10/04/2020   TRIG 61 10/04/2020   HDL 55 10/04/2020   CHOLHDL 3.1 10/04/2020   VLDL 12 10/04/2020   LDLCALC 104 (H) 10/04/2020    Physical Findings: AIMS: Facial and Oral Movements Muscles of Facial Expression: None, normal Lips and Perioral Area: None, normal Jaw: None, normal Tongue: None, normal,Extremity Movements Upper (arms, wrists, hands, fingers): None, normal Lower (legs, knees, ankles, toes): None, normal, Trunk Movements Neck, shoulders, hips: None, normal, Overall Severity Severity of abnormal movements (highest score from questions above): None, normal Incapacitation due to abnormal movements: None, normal Patient's awareness of abnormal movements (rate only patient's report): No Awareness, Dental Status Current problems with teeth and/or dentures?: No Does patient usually wear dentures?: No  CIWA:    COWS:     Musculoskeletal: Strength & Muscle Tone: within normal limits Gait & Station:  normal Patient leans: N/A  Psychiatric Specialty Exam: Physical Exam Vitals and nursing note reviewed.  Constitutional:      General: He is not in acute distress. HENT:     Head: Normocephalic and atraumatic.     Right Ear: External ear normal.     Left Ear: External ear normal.     Nose: Nose normal.     Mouth/Throat:     Mouth: Mucous membranes are moist.     Pharynx: Oropharynx is clear.  Eyes:     Extraocular Movements: Extraocular movements intact.     Conjunctiva/sclera: Conjunctivae normal.     Pupils: Pupils are equal, round, and reactive to light.  Cardiovascular:  Rate and Rhythm: Normal rate.     Pulses: Normal pulses.  Pulmonary:     Effort: Pulmonary effort is normal.     Breath sounds: Normal breath sounds.  Abdominal:     General: Abdomen is flat.     Palpations: Abdomen is soft.  Musculoskeletal:        General: No swelling. Normal range of motion.     Cervical back: Normal range of motion and neck supple.  Skin:    General: Skin is warm and dry.  Neurological:     General: No focal deficit present.     Mental Status: He is alert and oriented to person, place, and time.  Psychiatric:        Attention and Perception: He is attentive. He does not perceive auditory hallucinations.        Mood and Affect: Affect normal. Mood is not anxious. Affect is not flat.        Speech: Speech normal. Speech is not delayed.        Behavior: Behavior is not withdrawn. Behavior is cooperative.        Thought Content: Thought content is not paranoid or delusional.        Judgment: Judgment normal. Judgment is not impulsive.     Review of Systems  Constitutional: Negative for appetite change and fatigue.  HENT: Negative for rhinorrhea and sore throat.   Eyes: Negative for photophobia and visual disturbance.  Respiratory: Negative for cough and shortness of breath.   Cardiovascular: Negative for chest pain and palpitations.  Gastrointestinal: Negative for  constipation, diarrhea, nausea and vomiting.  Endocrine: Negative for cold intolerance and heat intolerance.  Genitourinary: Negative for difficulty urinating and dysuria.  Musculoskeletal: Negative for arthralgias and myalgias.  Skin: Negative for rash and wound.  Allergic/Immunologic: Negative for environmental allergies and food allergies.  Neurological: Negative for dizziness and light-headedness.  Hematological: Negative for adenopathy. Does not bruise/bleed easily.  Psychiatric/Behavioral: Positive for behavioral problems. Negative for hallucinations, sleep disturbance and suicidal ideas. The patient is not nervous/anxious.     Blood pressure (!) 136/96, pulse 86, temperature 98.6 F (37 C), temperature source Oral, resp. rate 17, height 5\' 11"  (1.803 m), weight 73.5 kg, SpO2 99 %.Body mass index is 22.59 kg/m.  General Appearance: Casual  Eye Contact:  Good  Speech:  Clear and Coherent and Normal Rate  Volume:  Normal  Mood:  Anxious  Affect:  Constricted  Thought Process:  Coherent and Disorganized  Orientation:  Full (Time, Place, and Person)  Thought Content:  Logical  Suicidal Thoughts:  No  Homicidal Thoughts:  No  Memory:  Immediate;   Fair  Judgement:  Intact  Insight:  Lacking  Psychomotor Activity:  Normal  Concentration:  Concentration: Fair and Attention Span: Fair  Recall:  of Knowledge:  Fair  Language:  Fair  Akathisia:  No  Handed:  Right  AIMS (if indicated):     Assets:  Housing Physical Health Resilience Social Support  ADL's:  Impaired  Cognition:  Impaired,  Mild  Sleep:  Number of Hours: 7.3     Treatment Plan Summary: Daily contact with patient to assess and evaluate symptoms and progress in treatment and Medication management  Patient is a 25 year old male with the above-stated past psychiatric history who is seen in follow-up.  Chart reviewed. Patient discussed with nursing.Patient started oral Paliperidone  tolerated  well.Patient continues to display bizarre behavior, socially withdrawn and internally-preoccupied. Will continue current antipsychotic.  Invega 234 mg IM injection given 10/08/20.  Loading doseInvega 156 mg IM injection completed 10/15/20. Continue Ativan 1 mg BID for catatonia.  Plan:  -continue inpatient psych admission; 15-minute checks; daily contact with patient to assess and evaluate symptoms and progress in treatment; psychoeducation.  -continue scheduled medications: . feeding supplement  237 mL Oral TID BM  . influenza vac split quadrivalent PF  0.5 mL Intramuscular Tomorrow-1000  . LORazepam  1 mg Oral TID  . multivitamin with minerals  1 tablet Oral Daily  . paliperidone  6 mg Oral QHS    -continue PRN medications.  acetaminophen, alum & mag hydroxide-simeth, bismuth subsalicylate, hydrOXYzine, magnesium hydroxide, traZODone  -Pertinent Labs: no new labs ordered today   -patient`s EKG showed a sinus rhythm without prolonged QTc interval. This will be monitored as the patient will be on antipsychotic medication.    -Consults: No new consults placed since yesterday    -Disposition will be determined after the patient is stabilized. All necessary aftercare will be arranged prior to discharge Likely d/c home with outpatient psych follow-up.  -  I certify that the patient does need, on a daily basis, active treatment furnished directly by or requiring the supervision of inpatient psychiatric facility personnel.   [name], MD 10/16/2020, 12:43 PM

## 2020-10-16 NOTE — Progress Notes (Signed)
Patient refuses to have vs taken after several attempts by tech and Clinical research associate.

## 2020-10-16 NOTE — Progress Notes (Signed)
Recreation Therapy Notes   Date: 10/16/2020  Time: 9:30 am   Location: Craft room     Behavioral response: N/A   Intervention Topic: Wellness   Discussion/Intervention: Patient did not attend group.   Clinical Observations/Feedback:  Patient did not attend group.   Taryn Nave LRT/CTRS        Eric Pineda 10/16/2020 10:58 AM

## 2020-10-16 NOTE — [facility] Group Notes (Signed)
LCSW Group Therapy Note  10/16/2020 2:10 PM  Type of Therapy/Topic:  Group Therapy:  Emotion Regulation  Participation Level:  Did Not Attend   Description of Group:   The purpose of this group is to assist patients in learning to regulate negative emotions and experience positive emotions. Patients will be guided to discuss ways in which they have been vulnerable to their negative emotions. These vulnerabilities will be juxtaposed with experiences of positive emotions or situations, and patients will be challenged to use positive emotions to combat negative ones. Special emphasis will be placed on coping with negative emotions in conflict situations, and patients will process healthy conflict resolution skills.  Therapeutic Goals: 1. Patient will identify two positive emotions or experiences to reflect on in order to balance out negative emotions 2. Patient will label two or more emotions that they find the most difficult to experience 3. Patient will demonstrate positive conflict resolution skills through discussion and/or role plays  Summary of Patient Progress: X  Therapeutic Modalities:   Cognitive Behavioral Therapy Feelings Identification Dialectical Behavioral Therapy  Layson Bertsch R. Schneur Crowson, MSW, LCSW, LCAS 10/16/2020 2:10 PM   

## 2020-10-17 MED ORDER — LORAZEPAM 1 MG PO TABS: 1.0000 mg | ORAL_TABLET | Freq: Two times a day (BID) | ORAL | 0 refills | Status: DC

## 2020-10-17 MED ORDER — HYDROXYZINE HCL 25 MG PO TABS: 25.0000 mg | ORAL_TABLET | Freq: Three times a day (TID) | ORAL | 1 refills | Status: DC | PRN

## 2020-10-17 MED ORDER — PALIPERIDONE PALMITATE ER 234 MG/1.5ML IM SUSY
234.0000 mg | PREFILLED_SYRINGE | INTRAMUSCULAR | 3 refills | Status: DC
Start: 2020-11-08 — End: 2021-01-20

## 2020-10-17 MED ORDER — TRAZODONE HCL 50 MG PO TABS: 50.0000 mg | ORAL_TABLET | Freq: Every evening | ORAL | 1 refills | Status: DC | PRN

## 2020-10-17 NOTE — Progress Notes (Signed)
Recreation Therapy Notes   Date: 10/17/2020   Time: 9:30 am   Location: Craft room     Behavioral response: N/A   Intervention Topic: Decision Making    Discussion/Intervention: Patient did not attend group.   Clinical Observations/Feedback:  Patient did not attend group.   Aboubacar Matsuo LRT/CTRS        Marquize Seib 10/17/2020 11:15 AM 

## 2020-10-17 NOTE — Progress Notes (Signed)
Patient discharged and waiting for ride.

## 2020-10-17 NOTE — [facility] Suicide Risk Assessment (Signed)
[facility] Discharge Suicide Risk Assessment   Principal Problem: Schizophrenia [facility]) Discharge Diagnoses: Principal Problem:   Schizophrenia (HCC) Active Problems:   Catatonia   Total Time spent with patient: 30 minutes  Musculoskeletal: Strength & Muscle Tone: within normal limits Gait & Station: normal Patient leans: N/A  Psychiatric Specialty Exam: Review of Systems  Constitutional: Negative for appetite change and fatigue.  HENT: Negative for rhinorrhea and sore throat.   Eyes: Negative for photophobia and visual disturbance.  Respiratory: Negative for cough and shortness of breath.   Cardiovascular: Negative for chest pain and palpitations.  Gastrointestinal: Negative for constipation, diarrhea, nausea and vomiting.  Endocrine: Negative for cold intolerance and heat intolerance.  Genitourinary: Negative for difficulty urinating and dysuria.  Musculoskeletal: Negative for arthralgias and myalgias.  Skin: Negative for rash and wound.  Allergic/Immunologic: Negative for environmental allergies and food allergies.  Neurological: Negative for dizziness and headaches.  Hematological: Negative for adenopathy. Does not bruise/bleed easily.  Psychiatric/Behavioral: Negative for agitation, behavioral problems, hallucinations and suicidal ideas.    Blood pressure 110/64, pulse 64, temperature 98.1 F (36.7 C), resp. rate 17, height 5\' 11"  (1.803 m), weight 73.5 kg, SpO2 98 %.Body mass index is 22.59 kg/m.  General Appearance: Fairly Groomed  ::  Good  Speech:  Clear and Coherent  Volume:  Normal  Mood:  Euthymic  Affect:  Congruent  Thought Process:  Coherent  Orientation:  Full (Time, Place, and Person)  Thought Content:  Logical  Suicidal Thoughts:  No  Homicidal Thoughts:  No  Memory:  Immediate;   Fair  Judgement:  Intact  Insight:  Shallow  Psychomotor Activity:  Normal  Concentration:  Fair  Recall:  [phone] of Knowledge:Fair  Language: Fair  Akathisia:   Negative  Handed:  Right  AIMS (if indicated):     Assets:  Communication Skills Desire for Improvement Housing Physical Health Social Support  Sleep:  Number of Hours: 5.5  Cognition: WNL  ADL's:  Intact   Mental Status Per Nursing Assessment::   On Admission:  NA  Demographic Factors:  Male  Loss Factors: NA  Historical Factors: Impulsivity  Risk Reduction Factors:   Sense of responsibility to family, Religious beliefs about death, Living with another person, especially a relative, Positive social support, Positive therapeutic relationship and Positive coping skills or problem solving skills  Continued Clinical Symptoms:  Schizophrenia:   Paranoid or undifferentiated type Previous Psychiatric Diagnoses and Treatments  Cognitive Features That Contribute To Risk:  None    Suicide Risk:  Minimal: No identifiable suicidal ideation.  Patients presenting with no risk factors but with morbid ruminations; may be classified as minimal risk based on the severity of the depressive symptoms   Follow-up Information    [facility] Follow up.   Why: Follow up with Eric Pineda on 10/24/2020 at 10AM via phone call/Zoom.  Thanks!  Contact information: [address] [place] [phone]               Plan Of Care/Follow-up recommendations:  Activity:  as tolerated Diet:  regular diet  [phone], MD 10/17/2020, 3:19 PM

## 2020-10-17 NOTE — Plan of Care (Signed)
  Problem: Group Participation Goal: STG - Patient will engage in groups without prompting or encouragement from LRT x3 group sessions within 5 recreation therapy group sessions Description: STG - Patient will engage in groups without prompting or encouragement from LRT x3 group sessions within 5 recreation therapy group sessions 10/17/2020 1526 by Ernest Haber, LRT Outcome: Not Applicable [id number]/[id number] by Ernest Haber, LRT Outcome: Not Met (add Reason) Note: Patient did not attend any groups.

## 2020-10-17 NOTE — Discharge Summary (Signed)
Physician Discharge Summary Note  Patient:  Eric Pineda is an 25 y.o., male MRN:  [id number] DOB:  [birth year] Patient phone:  [phone] (home)  Patient address:   [address] [place] [address],  Total Time spent with patient: 30 minutes  Date of Admission:  10/05/2020 Date of Discharge: 10/17/2020  Reason for Admission:  Mr. Wenzler is a 25yo M with a history of Schizophrenia, who was admitted to [facility] unit due to exacerbation of psychosis in settings of non-compliance with psych medications.  Principal Problem: Schizophrenia [facility]) Discharge Diagnoses: Principal Problem:   Schizophrenia (HCC) Active Problems:   Catatonia   Past Psychiatric History: Psych Dx: schizophrenia; started about a year ago.  Has had 2 hospitalizations in the past year: in March 2021 to [facility] and in July 2021 to [facility].  Pasty med trials: olanzapine during his first hospitalization. Unknown what medication he was on for the second.  No known history of suicidal or violent behavior.  No known substance abuse history. He is not on any outpatient medications for last 4 months at least.  Past Medical History:  Past Medical History:  Diagnosis Date  . Bizarre behavior 12/02/2019   History reviewed. No pertinent surgical history. Family History: History reviewed. No pertinent family history. Family Psychiatric  History: Unknown Social History:  Social History   Substance and Sexual Activity  Alcohol Use None     Social History   Substance and Sexual Activity  Drug Use Not on file    Social History   Socioeconomic History  . Marital status: Single    Spouse name: Not on file  . Number of children: Not on file  . Years of education: Not on file  . Highest education level: Not on file  Occupational History  . Not on file  Tobacco Use  . Smoking status: Never Smoker  . Smokeless tobacco: Never Used  Substance and Sexual Activity  . Alcohol use: Not on file  . Drug use: Not  on file  . Sexual activity: Not on file  Other Topics Concern  . Not on file  Social History Narrative  . Not on file   Social Determinants of Health   Financial Resource Strain: Not on file  Food Insecurity: Not on file  Transportation Needs: Not on file  Physical Activity: Not on file  Stress: Not on file  Social Connections: Not on file    Hospital Course:  Mr. Schwandt is a 25yo M with a history of Schizophrenia, who was admitted to [facility] unit due to exacerbation of psychosis in settings of non-compliance with psych medications. While in the hospital he also was noted to have several symptoms of catatonia including echolalia, stereotypy, mannerisms, minimal oral intake, and selective mutism. He was started on oral Invega 6 mg daily. He received Invega Sustenna 234 mg IM injection 10/08/2020 and completed loading dose Invega Sustenna 156 mg IM injection on 10/15/2020. Will provide scripts to continue monthly Invega Sustenna 234 mg IM injections at discharge. He was also given Ativan 1 mg TID for catatonia, and symptoms resolved. Tapered to Ativan 1 mg BID prior to discharge, and encourage to taper and discontinue. He denies suicidal ideations, homicidal ideations, visual hallucinations, and auditory hallucinations at time of discharge. He did agree to follow up with [facility] to continue his medications.    Physical Findings: AIMS: Facial and Oral Movements Muscles of Facial Expression: None, normal Lips and Perioral Area: None, normal Jaw: None, normal Tongue: None,  normal,Extremity Movements Upper (arms, wrists, hands, fingers): None, normal Lower (legs, knees, ankles, toes): None, normal, Trunk Movements Neck, shoulders, hips: None, normal, Overall Severity Severity of abnormal movements (highest score from questions above): None, normal Incapacitation due to abnormal movements: None, normal Patient's awareness of abnormal movements (rate only patient's report): No Awareness, Dental  Status Current problems with teeth and/or dentures?: No Does patient usually wear dentures?: No  CIWA:    COWS:     Musculoskeletal: Strength & Muscle Tone: within normal limits Gait & Station: normal Patient leans: N/A  Psychiatric Specialty Exam: Physical Exam Vitals and nursing note reviewed.  Constitutional:      Appearance: Normal appearance.  HENT:     Head: Normocephalic and atraumatic.     Right Ear: External ear normal.     Left Ear: External ear normal.     Nose: Nose normal.     Mouth/Throat:     Mouth: Mucous membranes are moist.     Pharynx: Oropharynx is clear.  Eyes:     Extraocular Movements: Extraocular movements intact.     Conjunctiva/sclera: Conjunctivae normal.     Pupils: Pupils are equal, round, and reactive to light.  Cardiovascular:     Rate and Rhythm: Normal rate.     Pulses: Normal pulses.  Pulmonary:     Effort: Pulmonary effort is normal.     Breath sounds: Normal breath sounds.  Abdominal:     General: Abdomen is flat.     Palpations: Abdomen is soft.  Musculoskeletal:        General: No swelling. Normal range of motion.     Cervical back: Normal range of motion and neck supple.  Skin:    General: Skin is warm and dry.  Neurological:     General: No focal deficit present.     Mental Status: He is alert and oriented to person, place, and time.  Psychiatric:        Mood and Affect: Mood normal.        Behavior: Behavior normal.        Thought Content: Thought content normal.        Judgment: Judgment normal.     Review of Systems  Constitutional: Negative for appetite change and fatigue.  HENT: Negative for rhinorrhea and sore throat.   Eyes: Negative for photophobia and visual disturbance.  Respiratory: Negative for cough and shortness of breath.   Cardiovascular: Negative for chest pain and palpitations.  Gastrointestinal: Negative for constipation, diarrhea, nausea and vomiting.  Endocrine: Negative for cold intolerance and  heat intolerance.  Genitourinary: Negative for difficulty urinating and dysuria.  Musculoskeletal: Negative for arthralgias and myalgias.  Skin: Negative for rash and wound.  Allergic/Immunologic: Negative for environmental allergies and food allergies.  Neurological: Negative for dizziness and headaches.  Hematological: Negative for adenopathy. Does not bruise/bleed easily.  Psychiatric/Behavioral: Negative for agitation, behavioral problems, hallucinations and suicidal ideas.    Blood pressure 110/64, pulse 64, temperature 98.1 F (36.7 C), resp. rate 17, height 5\' 11"  (1.803 m), weight 73.5 kg, SpO2 98 %.Body mass index is 22.59 kg/m.  General Appearance: Fairly Groomed  ::  Good  Speech:  Clear and Coherent  Volume:  Normal  Mood:  Euthymic  Affect:  Congruent  Thought Process:  Coherent  Orientation:  Full (Time, Place, and Person)  Thought Content:  Logical  Suicidal Thoughts:  No  Homicidal Thoughts:  No  Memory:  Immediate;   Fair  Judgement:  Intact  Insight:  Shallow  Psychomotor Activity:  Normal  Concentration:  Fair  Recall:  Fair  Fund of Knowledge:Fair  Language: Fair  Akathisia:  Negative  Handed:  Right  AIMS (if indicated):     Assets:  Communication Skills Desire for Improvement Housing Physical Health Social Support  Sleep:  Number of Hours: 5.5  Cognition: WNL  ADL's:  Intact           Has this patient used any form of tobacco in the last 30 days? (Cigarettes, Smokeless Tobacco, Cigars, and/or Pipes) No  Blood Alcohol level:  Lab Results  Component Value Date   ETH <10 10/04/2020   ETH <10 04/16/2020    Metabolic Disorder Labs:  Lab Results  Component Value Date   HGBA1C 5.1 12/03/2019   MPG 99.67 12/03/2019   No results found for: PROLACTIN Lab Results  Component Value Date   CHOL 171 10/04/2020   TRIG 61 10/04/2020   HDL 55 10/04/2020   CHOLHDL 3.1 10/04/2020   VLDL 12 10/04/2020   LDLCALC 104 (H) 10/04/2020     See Psychiatric Specialty Exam and Suicide Risk Assessment completed by Attending Physician prior to discharge.  Discharge destination:  Home  Is patient on multiple antipsychotic therapies at discharge:  No   Has Patient had three or more failed trials of antipsychotic monotherapy by history:  No  Recommended Plan for Multiple Antipsychotic Therapies: NA  Discharge Instructions    Diet general   Complete by: As directed    Increase activity slowly   Complete by: As directed      Allergies as of 10/17/2020   No Known Allergies     Medication List    STOP taking these medications   FLUoxetine 20 MG capsule Commonly known as: PROZAC   OLANZapine zydis 20 MG disintegrating tablet Commonly known as: ZYPREXA     TAKE these medications     Indication  hydrOXYzine 25 MG tablet Commonly known as: ATARAX/VISTARIL Take 1 tablet (25 mg total) by mouth 3 (three) times daily as needed for anxiety.  Indication: Feeling Anxious   LORazepam 1 MG tablet Commonly known as: ATIVAN Take 1 tablet (1 mg total) by mouth 2 (two) times daily.  Indication: Catatonia   paliperidone 234 MG/1.5ML Susy injection Commonly known as: INVEGA SUSTENNA Inject 234 mg into the muscle every 30 (thirty) days. Start taking on: November 08, 2020  Indication: Schizoaffective Disorder   traZODone 50 MG tablet Commonly known as: DESYREL Take 1 tablet (50 mg total) by mouth at bedtime as needed for sleep.  Indication: Trouble Sleeping       Follow-up Information    [facility] Follow up.   Why: Follow up with Samaria on 10/24/2020 at 10AM via phone call/Zoom.  Thanks!  Contact information: [address] [place] [address] [phone]               Follow-up recommendations:  Activity:  as tolerated Diet:  regular diet  Comments: Printed prescriptions provided at discharge. He received Invega Sustenna 234 mg IM injection 10/08/2020 and completed loading dose  Invega Sustenna 156 mg IM injection on 10/15/2020. Will provide scripts to continue monthly Invega Sustenna 234 mg IM injections at discharge. He was also given Ativan 1 mg TID for catatonia, and symptoms resolved. Tapered to Ativan 1 mg BID prior to discharge, and encourage to taper and discontinue.  Signed: [name], MD 10/17/2020, 3:21 PM

## 2020-10-17 NOTE — BHH Group Notes (Signed)
LCSW Group Therapy Note  10/17/2020 2:11 PM  Type of Therapy/Topic:  Group Therapy:  Balance in Life  Participation Level:  Did Not Attend  Description of Group:    This group will address the concept of balance and how it feels and looks when one is unbalanced. Patients will be encouraged to process areas in their lives that are out of balance and identify reasons for remaining unbalanced. Facilitators will guide patients in utilizing problem-solving interventions to address and correct the stressor making their life unbalanced. Understanding and applying boundaries will be explored and addressed for obtaining and maintaining a balanced life. Patients will be encouraged to explore ways to assertively make their unbalanced needs known to significant others in their lives, using other group members and facilitator for support and feedback.  Therapeutic Goals: 1. Patient will identify two or more emotions or situations they have that consume much of in their lives. 2. Patient will identify signs/triggers that life has become out of balance:  3. Patient will identify two ways to set boundaries in order to achieve balance in their lives:  4. Patient will demonstrate ability to communicate their needs through discussion and/or role plays  Summary of Patient Progress: X  Therapeutic Modalities:   Cognitive Behavioral Therapy Solution-Focused Therapy Assertiveness Training  Penni Homans MSW, LCSW 10/17/2020 2:11 PM

## 2020-10-17 NOTE — Progress Notes (Signed)
  [facility] Adult Case Management Discharge Plan :  Will you be returning to the same living situation after discharge:  Yes,  pt reports that he is returning home.  At discharge, do you have transportation home?: Yes,  pt's mother will provide transportation.  Do you have the ability to pay for your medications: No.  Release of information consent forms completed and in the chart;  Patient's signature needed at discharge.  Patient to Follow up at:  Follow-up Information    [facility] Follow up.   Why: Follow up with Samaria on 10/24/2020 at 10AM via phone call/Zoom.  Thanks!  Contact information: [address] [place] [address] [phone]               Next level of care provider has access to [facility] Link:no  Safety Planning and Suicide Prevention discussed: Yes,  DPE completed with patient and mother.      Has patient been referred to the Quitline?: Patient refused referral  Patient has been referred for addiction treatment: Pt. refused referral  Harden Mo, LCSW 10/17/2020, 12:56 PM

## 2020-10-17 NOTE — Progress Notes (Signed)
Recreation Therapy Notes  INPATIENT RECREATION TR PLAN  Patient Details Name: Eric Pineda MRN: [id number] DOB: April 30, 1996 Today's Date: 10/17/2020  Rec Therapy Plan Is patient appropriate for Therapeutic Recreation?: Yes Treatment times per week: at least 3 Estimated Length of Stay: 5-7 days TR Treatment/Interventions: Group participation (Comment)  Discharge Criteria Pt will be discharged from therapy if:: Discharged Treatment plan/goals/alternatives discussed and agreed upon by:: Patient/family  Discharge Summary Short term goals set: Patient will engage in groups without prompting or encouragement from LRT x3 group sessions within 5 recreation therapy group sessions Short term goals met: Not met Reason goals not met: Patient did not attend any groups Therapeutic equipment acquired: N/A Reason patient discharged from therapy: Discharge from hospital Pt/family agrees with progress & goals achieved: Yes Date patient discharged from therapy: 10/17/20   Celia Friedland 10/17/2020, 3:27 PM

## 2021-01-07 ENCOUNTER — Emergency Department
Admission: EM | Admit: 2021-01-07 | Discharge: 2021-01-09 | Disposition: A | Discharge status: Home / Self Care | Payer: Medicaid Other | Attending: Student in an Organized Health Care Education/Training Program | Admitting: Student in an Organized Health Care Education/Training Program

## 2021-01-07 ENCOUNTER — Other Ambulatory Visit: Payer: Self-pay

## 2021-01-07 ENCOUNTER — Encounter: Payer: Self-pay | Admitting: Emergency Medicine

## 2021-01-07 DIAGNOSIS — Z79899 Other long term (current) drug therapy: Secondary | ICD-10-CM | POA: Insufficient documentation

## 2021-01-07 DIAGNOSIS — F209 Schizophrenia, unspecified: Secondary | ICD-10-CM | POA: Diagnosis present

## 2021-01-07 DIAGNOSIS — R259 Unspecified abnormal involuntary movements: Secondary | ICD-10-CM | POA: Insufficient documentation

## 2021-01-07 DIAGNOSIS — Z20822 Contact with and (suspected) exposure to covid-19: Secondary | ICD-10-CM | POA: Insufficient documentation

## 2021-01-07 DIAGNOSIS — Z046 Encounter for general psychiatric examination, requested by authority: Secondary | ICD-10-CM

## 2021-01-07 DIAGNOSIS — F2081 Schizophreniform disorder: Secondary | ICD-10-CM | POA: Insufficient documentation

## 2021-01-07 DIAGNOSIS — R462 Strange and inexplicable behavior: Secondary | ICD-10-CM

## 2021-01-07 DIAGNOSIS — F203 Undifferentiated schizophrenia: Secondary | ICD-10-CM

## 2021-01-07 LAB — URINE DRUG SCREEN, QUALITATIVE (ARMC ONLY)
Amphetamines, Ur Screen: NOT DETECTED
Barbiturates, Ur Screen: NOT DETECTED
Benzodiazepine, Ur Scrn: NOT DETECTED
Cannabinoid 50 Ng, Ur ~~LOC~~: NOT DETECTED
Cocaine Metabolite,Ur ~~LOC~~: NOT DETECTED
MDMA (Ecstasy)Ur Screen: NOT DETECTED
Methadone Scn, Ur: NOT DETECTED
Opiate, Ur Screen: NOT DETECTED
Phencyclidine (PCP) Ur S: NOT DETECTED
Tricyclic, Ur Screen: NOT DETECTED

## 2021-01-07 NOTE — ED Provider Notes (Signed)
[facility] Emergency Department Provider Note    Event Date/Time   First MD Initiated Contact with Patient 01/07/21 2234     (approximate)  I have reviewed the triage vital signs and the nursing notes.   HISTORY  Chief Complaint Psychiatric Evaluation    HPI Eric Pineda is a 25 y.o. male presents to the ER for evaluation of bizarre behavior.  Patient is IVC brought in by [employer].  Reportedly not taking his medication since February reportedly spending nights in the woods pacing back-and-forth.  There are some question of substance abuse.  Patient states that he is here for a "regular checkup.  "Does not have any complaints.    Past Medical History:  Diagnosis Date  . Bizarre behavior 12/02/2019   History reviewed. No pertinent family history. History reviewed. No pertinent surgical history. Patient Active Problem List   Diagnosis Date Noted  . 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 Schizophrenia (HCC) 10/04/2020  . 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 Bizarre behavior 12/02/2019  . Involuntary commitment       Prior to Admission medications   Medication Sig Start Date End Date Taking? Authorizing Provider  hydrOXYzine (ATARAX/VISTARIL) 25 MG tablet Take 1 tablet (25 mg total) by mouth 3 (three) times daily as needed for anxiety. 10/17/20   [name], MD  LORazepam (ATIVAN) 1 MG tablet Take 1 tablet (1 mg total) by mouth 2 (two) times daily. 10/17/20   [name], MD  paliperidone (INVEGA SUSTENNA) 234 MG/1.5ML SUSY injection Inject 234 mg into the muscle every 30 (thirty) days. 11/08/20   [name], MD  traZODone (DESYREL) 50 MG tablet Take 1 tablet (50 mg total) by mouth at bedtime as needed for sleep. 10/17/20   [name], MD    Allergies Patient has no known allergies.    Social History Social History   Tobacco Use  . Smoking status: Never Smoker  . Smokeless tobacco: Never Used    Review of  Systems Patient denies headaches, rhinorrhea, blurry vision, numbness, shortness of breath, chest pain, edema, cough, abdominal pain, nausea, vomiting, diarrhea, dysuria, fevers, rashes or hallucinations unless otherwise stated above in HPI. ____________________________________________   PHYSICAL EXAM:  VITAL SIGNS: Vitals:   01/07/21 2048  BP: (!) 143/97  Pulse: 90  Resp: 18  Temp: 98 F (36.7 C)  SpO2: 99%    Constitutional: Alert and oriented.  Eyes: Conjunctivae are normal.  Head: Atraumatic. Nose: No congestion/rhinnorhea. Mouth/Throat: Mucous membranes are moist.   Neck: No stridor. Painless ROM.  Cardiovascular: Normal rate, regular rhythm. Grossly normal heart sounds.  Good peripheral circulation. Respiratory: Normal respiratory effort.  No retractions. Lungs CTAB. Gastrointestinal: Soft and nontender. No distention. No abdominal bruits. No CVA tenderness. Genitourinary:  Musculoskeletal: No lower extremity tenderness nor edema.  No joint effusions. Neurologic:  Normal speech and language. No gross focal neurologic deficits are appreciated. No facial droop Skin:  Skin is warm, dry and intact. No rash noted. Psychiatric: odd, withdrawn.  ____________________________________________   LABS (all labs ordered are listed, but only abnormal results are displayed)  No results found for this or any previous visit (from the past 24 hour(s)). ____________________________________________ ____________________________________________  RADIOLOGY   ____________________________________________   PROCEDURES  Procedure(s) performed:  Procedures    Critical Care performed: no ____________________________________________   INITIAL IMPRESSION / ASSESSMENT AND PLAN / ED COURSE  Pertinent labs & imaging results that were available during my care of the patient were reviewed by  me and considered in my medical decision making (see chart for details).   DDX: Psychosis,  delirium, medication effect, noncompliance, polysubstance abuse, Si, Hi, depression   Eric Pineda is a 25 y.o. who presents to the ED with for evaluation of bizarre behavior.  Patient has psych history of schizophrenia.  Laboratory testing was ordered to evaluation for underlying electrolyte derangement or signs of underlying organic pathology to explain today's presentation.  Based on history and physical and laboratory evaluation, it appears that the patient's presentation is 2/2 underlying psychiatric disorder and will require further evaluation and management by inpatient psychiatry.  Patient was  made an IVC pta.  Disposition pending psychiatric evaluation.     The patient has been placed in psychiatric observation due to the need to provide a safe environment for the patient while obtaining psychiatric consultation and evaluation, as well as ongoing medical and medication management to treat the patient's condition.  The patient has been placed under full IVC at this time.   The patient was evaluated in Emergency Department today for the symptoms described in the history of present illness. He/she was evaluated in the context of the global COVID-19 pandemic, which necessitated consideration that the patient might be at risk for infection with the SARS-CoV-2 virus that causes COVID-19. Institutional protocols and algorithms that pertain to the evaluation of patients at risk for COVID-19 are in a state of rapid change based on information released by regulatory bodies including the CDC and federal and state organizations. These policies and algorithms were followed during the patient's care in the ED.  As part of my medical decision making, I reviewed the following data within the electronic MEDICAL RECORD NUMBER Nursing notes reviewed and incorporated, Labs reviewed, notes from prior ED visits and [place] Controlled Substance Database   ____________________________________________   FINAL CLINICAL  IMPRESSION(S) / ED DIAGNOSES  Final diagnoses:  Bizarre behavior      NEW MEDICATIONS STARTED DURING THIS VISIT:  New Prescriptions   No medications on file     Note:  This document was prepared using Dragon voice recognition software and may include unintentional dictation errors.    Willy Eddy, MD 01/07/21 2242

## 2021-01-07 NOTE — ED Triage Notes (Addendum)
Pt arrived via [facility] with IVC paperwork, pt has been IVC'd in the past. Per paperwork pt has been having auditory hallucinations and has not been taking medications since Feb.  Per paperwork, pt stays in the woods most of the time, walks around looking for friends and has walked out in front of vehicles without looking and also paces frequently.  Per paperwork pt has been possibly using drugs and alcohol.   During triage process, pt not answering questions, looks away from triage nurse.
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
## 2021-01-07 NOTE — ED Triage Notes (Addendum)
Pt belongings:  Hooded sweatshirt, t-shirt x 2, black socks, jean shorts, boxers, slides.   Pt insisted on going to the bathroom before changing out, pt was taken to the bathroom, but refused to use bathroom and refused to be changed out.  Pt stated his father is a "bad alcoholic" and needs help and he (the patient) doesn't think he (the patient) needs to be here

## 2021-01-07 NOTE — ED Notes (Addendum)
Pt refusing blood work and being dressed out at this time. Pt stopped changing after taking off his sweatshirt. Pt refusing to answer any other questions at this time, however, he did answer no when asked if he was having any thoughts of harming himself or others.  Psychologist, forensic with patient until roomed. Pt placed in wheelchair.

## 2021-01-08 DIAGNOSIS — F203 Undifferentiated schizophrenia: Secondary | ICD-10-CM | POA: Diagnosis not present

## 2021-01-08 LAB — COMPREHENSIVE METABOLIC PANEL
ALT: 13 U/L (ref 0–44)
AST: 18 U/L (ref 15–41)
Albumin: 4.2 g/dL (ref 3.5–5.0)
Alkaline Phosphatase: 70 U/L (ref 38–126)
Anion gap: 8 (ref 5–15)
BUN: 12 mg/dL (ref 6–20)
CO2: 26 mmol/L (ref 22–32)
Calcium: 9.3 mg/dL (ref 8.9–10.3)
Chloride: 104 mmol/L (ref 98–111)
Creatinine, Ser: 0.99 mg/dL (ref 0.61–1.24)
GFR, Estimated: >60 mL/min (ref >60)
Glucose, Bld: 107 mg/dL — ABNORMAL HIGH (ref 70–99)
Potassium: 3.6 mmol/L (ref 3.5–5.1)
Sodium: 138 mmol/L (ref 135–145)
Total Bilirubin: 0.6 mg/dL (ref 0.3–1.2)
Total Protein: 7.1 g/dL (ref 6.5–8.1)

## 2021-01-08 LAB — CBC
HCT: 39.8 % (ref 39.0–52.0)
Hemoglobin: 13.4 g/dL (ref 13.0–17.0)
MCH: 29.6 pg (ref 26.0–34.0)
MCHC: 33.7 g/dL (ref 30.0–36.0)
MCV: 87.9 fL (ref 80.0–100.0)
Platelets: 276 10*3/uL (ref 150–400)
RBC: 4.53 MIL/uL (ref 4.22–5.81)
RDW: 11.9 % (ref 11.5–15.5)
WBC: 6.9 10*3/uL (ref 4.0–10.5)
nRBC: 0 % (ref 0.0–0.2)

## 2021-01-08 LAB — RESP PANEL BY RT-PCR (FLU A&B, COVID) ARPGX2
Influenza A by PCR: NEGATIVE
Influenza B by PCR: NEGATIVE
SARS Coronavirus 2 by RT PCR: NEGATIVE

## 2021-01-08 LAB — ETHANOL: Alcohol, Ethyl (B): <10 mg/dL (ref <10)

## 2021-01-08 MED ORDER — PALIPERIDONE PALMITATE ER 234 MG/1.5ML IM SUSY
234.0000 mg | PREFILLED_SYRINGE | Freq: Once | INTRAMUSCULAR | Status: AC
  Administered 2021-01-08: 234 mg via INTRAMUSCULAR
  Filled 2021-01-08: qty 1.5

## 2021-01-08 NOTE — ED Notes (Signed)
Patient refused to give blood or covid swab done.

## 2021-01-08 NOTE — BH Assessment (Signed)
[facility] will review for admission on 01/08/21. Does not have bed availability at this time.   Referral information for Psychiatric Hospitalization faxed to:   [facility] ([phone]),   [address] [phone]),   [facility] [phone] -or- [phone]),   [facility] [phone]   [facility] [phone]),   [facility] [phone] or [phone])   [facility] [phone] or [phone])   [facility] [phone] or [phone]),   [facility] [phone])

## 2021-01-08 NOTE — Consult Note (Signed)
[facility] Face-to-Face Psychiatry Consult   Reason for Consult:Psychiatric Evaluation Referring Physician:  Dr. Roxan Hockey [name] MRN:  [id number] Principal Diagnosis: <principal problem not specified> Diagnosis:  Active Problems:   Bizarre behavior   Involuntary commitment   Schizophreniform disorder (HCC)   Schizophrenia (HCC)   Total Time spent with patient: 20 minutes  Subjective: "I think taking medication is a waste of money." Eric Pineda is a 25 y.o. male patient presented to [facility] via law enforcement under involuntary commitment status (IVC). The patient was seen lying in bed. The patient says that he has a room with a bed in it. The patient denies that he lives in the woods. He states he lives at home with his mom.  Per the ED triage nurse note, Pt arrived via [facility] with IVC paperwork, pt has been IVC'd in the past. Per paperwork pt has been having auditory hallucinations and has not been taking medications since Feb.  Per paperwork, pt stays in the woods most of the time, walks around looking for friends and has walked out in front of vehicles without looking and also paces frequently. Per paperwork pt has been possibly using drugs and alcohol.  During triage process, pt not answering questions, looks away from triage nurse. The patient was seen face-to-face by this provider; the chart was reviewed and consulted with Dr.Sung on 01/07/2021 due to the patient's care. It was discussed with the EDP that the patient does meet the criteria to be admitted to the psychiatric inpatient unit.  On evaluation, the patient is alert and oriented x4, calm, thought blocking, and at times to be cooperative and mood-congruent with affect. The patient does appear to be responding to internal stimuli but not external stimuli. The patient is presenting with some delusional thinking. The patient denies auditory or visual hallucinations. During his assessment, the  patient looked away as if listening to someone else. When he was asked if he was experiencing auditory hallucinations, he denied it. The patient denies any suicidal, homicidal, or self-harm ideations. The patient is presenting with some psychotic and paranoid behaviors. During an encounter with the patient, he was able to answer questions appropriately.  HPI: Per Dr. Roxan Hockey, Eric Pineda is a 25 y.o. male presents to the ER for evaluation of bizarre behavior.  Patient is IVC brought in by [employer].  Reportedly not taking his medication since February reportedly spending nights in the woods pacing back-and-forth.  There are some question of substance abuse.  Patient states that he is here for a "regular checkup.  "Does not have any complaints.   Past Psychiatric History:  Bizarre behavior  Risk to Self:   Risk to Others:   Prior Inpatient Therapy:   Prior Outpatient Therapy:    Past Medical History:  Past Medical History:  Diagnosis Date  . Bizarre behavior 12/02/2019   History reviewed. No pertinent surgical history. Family History: History reviewed. No pertinent family history. Family Psychiatric  History:  Social History:  Social History   Substance and Sexual Activity  Alcohol Use None     Social History   Substance and Sexual Activity  Drug Use Not on file    Social History   Socioeconomic History  . Marital status: Single    Spouse name: Not on file  . Number of children: Not on file  . Years of education: Not on file  . Highest education level: Not on file  Occupational History  . Not on file  Tobacco Use  . Smoking status: Never Smoker  . Smokeless tobacco: Never Used  Substance and Sexual Activity  . Alcohol use: Not on file  . Drug use: Not on file  . Sexual activity: Not on file  Other Topics Concern  . Not on file  Social History Narrative  . Not on file   Social Determinants of Health   Financial Resource Strain: Not on file  Food  Insecurity: Not on file  Transportation Needs: Not on file  Physical Activity: Not on file  Stress: Not on file  Social Connections: Not on file   Additional Social History:    Allergies:  No Known Allergies  Labs:  Results for orders placed or performed during the hospital encounter of 01/07/21 (from the past 48 hour(s))  Urine Drug Screen, Qualitative     Status: None   Collection Time: 01/07/21 10:21 PM  Result Value Ref Range   Tricyclic, Ur Screen NONE DETECTED NONE DETECTED   Amphetamines, Ur Screen NONE DETECTED NONE DETECTED   MDMA (Ecstasy)Ur Screen NONE DETECTED NONE DETECTED   Cocaine Metabolite,Ur [place] NONE DETECTED NONE DETECTED   Opiate, Ur Screen NONE DETECTED NONE DETECTED   Phencyclidine (PCP) Ur S NONE DETECTED NONE DETECTED   Cannabinoid 50 Ng, Ur [place] NONE DETECTED NONE DETECTED   Barbiturates, Ur Screen NONE DETECTED NONE DETECTED   Benzodiazepine, Ur Scrn NONE DETECTED NONE DETECTED   Methadone Scn, Ur NONE DETECTED NONE DETECTED    Comment: (NOTE) Tricyclics + metabolites, urine    Cutoff 1000 ng/mL Amphetamines + metabolites, urine  Cutoff 1000 ng/mL MDMA (Ecstasy), urine              Cutoff 500 ng/mL Cocaine Metabolite, urine          Cutoff 300 ng/mL Opiate + metabolites, urine        Cutoff 300 ng/mL Phencyclidine (PCP), urine         Cutoff 25 ng/mL Cannabinoid, urine                 Cutoff 50 ng/mL Barbiturates + metabolites, urine  Cutoff 200 ng/mL Benzodiazepine, urine              Cutoff 200 ng/mL Methadone, urine                   Cutoff 300 ng/mL  The urine drug screen provides only a preliminary, unconfirmed analytical test result and should not be used for non-medical purposes. Clinical consideration and professional judgment should be applied to any positive drug screen result due to possible interfering substances. A more specific alternate chemical method must be used in order to obtain a confirmed analytical result. Gas chromatography /  mass spectrometry (GC/MS) is the preferred confirm atory method. Performed at [facility], [address] [place], [place] [address]     No current facility-administered medications for this encounter.   Current Outpatient Medications  Medication Sig Dispense Refill  . hydrOXYzine (ATARAX/VISTARIL) 25 MG tablet Take 1 tablet (25 mg total) by mouth 3 (three) times daily as needed for anxiety. 90 tablet 1  . LORazepam (ATIVAN) 1 MG tablet Take 1 tablet (1 mg total) by mouth 2 (two) times daily. 14 tablet 0  . paliperidone (INVEGA SUSTENNA) 234 MG/1.5ML SUSY injection Inject 234 mg into the muscle every 30 (thirty) days. 1.5 mL 3  . traZODone (DESYREL) 50 MG tablet Take 1 tablet (50 mg total) by mouth at bedtime as needed for sleep.  30 tablet 1    Musculoskeletal: Strength & Muscle Tone: within normal limits Gait & Station: normal Patient leans: N/A            Psychiatric Specialty Exam:  Presentation  General Appearance: Bizarre; Fairly Groomed  Eye Contact:Fair  Speech:Blocked; Slow  Speech Volume:Decreased  Handedness:Right   Mood and Affect  Mood:Depressed; Euphoric  Affect:Blunt; Inappropriate; Labile   Thought Process  Thought Processes:Disorganized  Descriptions of Associations:Circumstantial  Orientation:Full (Time, Place and Person)  Thought Content:Delusions; Illogical; Scattered  History of Schizophrenia/Schizoaffective disorder:Yes  Duration of Psychotic Symptoms:Greater than six months  Hallucinations:Hallucinations: Auditory  Ideas of Reference:Paranoia  Suicidal Thoughts:Suicidal Thoughts: No  Homicidal Thoughts:Homicidal Thoughts: No   Sensorium  Memory:Immediate Poor; Recent Poor; Remote Poor  Judgment:Poor  Insight:Lacking   Executive Functions  Concentration:Poor  Attention Span:Fair  Recall:Fair  Fund of Knowledge:Poor  Language:Fair   Psychomotor Activity  Psychomotor Activity:Psychomotor  Activity: Normal   Assets  Assets:Communication Skills; Desire for Improvement; Financial Resources/Insurance; Resilience; Social Support   Sleep  Sleep:Sleep: Fair   Physical Exam: Physical Exam Vitals and nursing note reviewed.  Constitutional:      Appearance: He is normal weight.  HENT:     Nose: Nose normal.     Mouth/Throat:     Mouth: Mucous membranes are moist.  Cardiovascular:     Rate and Rhythm: Tachycardia present.  Pulmonary:     Effort: Pulmonary effort is normal.  Musculoskeletal:        General: Normal range of motion.     Cervical back: Normal range of motion and neck supple.  Neurological:     General: No focal deficit present.     Mental Status: He is alert and oriented to person, place, and time.  Psychiatric:        Attention and Perception: He perceives auditory hallucinations.        Mood and Affect: Mood is depressed. Affect is blunt.        Speech: Speech is delayed.        Behavior: Behavior is withdrawn. Behavior is cooperative.        Thought Content: Thought content is delusional.        Cognition and Memory: Cognition is impaired.        Judgment: Judgment is inappropriate.    Review of Systems  Psychiatric/Behavioral: Positive for depression and hallucinations. The patient is nervous/anxious and has insomnia.   All other systems reviewed and are negative.  Blood pressure (!) 143/97, pulse 90, temperature 98 F (36.7 C), temperature source Oral, resp. rate 18, height 5\' 11"  (1.803 m), weight 73.5 kg, SpO2 99 %. Body mass index is 22.6 kg/m.  Treatment Plan Summary: Medication management and Plan The patient is a safety risk to himself and requires psychiatric inpatient admission for stabilization and treatment.  Disposition: Recommend psychiatric Inpatient admission when medically cleared. Supportive therapy provided about ongoing stressors.  , NP 01/08/2021 1:38 AM

## 2021-01-08 NOTE — ED Notes (Signed)
Cup of water given to patient at this time 

## 2021-01-08 NOTE — BH Assessment (Signed)
Comprehensive Clinical Assessment (CCA) Note  01/08/2021 Eric Pineda [id number]   Constance Goltz, 25 year old male who presents to [facility] ED involuntarily for treatment. Per triage note, Pt arrived via [facility] with IVC paperwork, pt has been IVC'd in the past. Per paperwork pt has been having auditory hallucinations and has not been taking medications since Feb.  Per paperwork, pt stays in the woods most of the time, walks around looking for friends and has walked out in front of vehicles without looking and paces frequently.  Per paperwork pt has been possibly using drugs and alcohol. During triage process, pt not answering questions, looks away from triage nurse.    During TTS assessment pt presents alert and oriented x 4, restless but cooperative, and mood-congruent with affect. The pt does not appear to be responding to external stimuli but looks away from NP and TTS as if someone else is in the room. Pt presenting with some delusional thinking. Pt verified the information provided to triage RN.   Pt identifies his main complaint to be that he was brought to the ED for a "regular checkup". When asked what kind of check up, patient answered with " I don't know." Patient continued answering most questions throughout the assessment with "I don't know. Patient presented with thought blocking and poor insight. Pt reports no current INPT or OPT hx. Pt denies current SI/HI/AH/VH.   Per Annice Pih, NP, pt is recommended for inpatient psychiatric treatment.     Chief Complaint:  Chief Complaint  Patient presents with  . Psychiatric Evaluation   Visit Diagnosis: Schizophreniform   CCA Screening, Triage and Referral (STR)  Patient Reported Information How did you hear about [place]? Family/Friend  Referral name: No data recorded Referral phone number: [phone] (patient provided multiple different numbers.  He could not reemember)   Whom do you see for routine medical problems? Primary  Care  Practice/Facility Name: Unknown  Practice/Facility Phone Number: No data recorded Name of Contact: No data recorded Contact Number: No data recorded Contact Fax Number: No data recorded Prescriber Name: No data recorded Prescriber Address (if known): No data recorded  What Is the Reason for Your Visit/Call Today? Patient reports he is at the hospital for a "regular check up".  How Long Has This Been Causing You Problems? <Week  What Do You Feel Would Help You the Most Today? Medication(s)   Have You Recently Been in Any Inpatient Treatment (Hospital/Detox/Crisis Center/28-Day Program)? No  Name/Location of Program/Hospital:No data recorded How Long Were You There? No data recorded When Were You Discharged? No data recorded  Have You Ever Received Services From [facility] Before? No data recorded Who Do You See at [facility]? No data recorded  Have You Recently Had Any Thoughts About Hurting Yourself? No  Are You Planning to Commit Suicide/Harm Yourself At This time? No   Have you Recently Had Thoughts About Hurting Someone Karolee Ohs? No  Explanation: No data recorded  Have You Used Any Alcohol or Drugs in the Past 24 Hours? No  How Long Ago Did You Use Drugs or Alcohol? No data recorded What Did You Use and How Much? No data recorded  Do You Currently Have a Therapist/Psychiatrist? No  Name of Therapist/Psychiatrist: No data recorded  Have You Been Recently Discharged From Any Office Practice or Programs? No  Explanation of Discharge From Practice/Program: No data recorded    CCA Screening Triage Referral Assessment Type of Contact: Face-to-Face  Is this Initial or Reassessment? No  data recorded Date Telepsych consult ordered in [facility]:  04/16/2020  Time Telepsych consult ordered in [facility]:  No data recorded  Patient Reported Information Reviewed? Yes  Patient Left Without Being Seen? No data recorded Reason for Not Completing Assessment: No data  recorded  Collateral Involvement: None provided   Does Patient Have a Court Appointed Legal Guardian? No data recorded Name and Contact of Legal Guardian: self  If Minor and Not Living with Parent(s), Who has Custody? n/a  Is CPS involved or ever been involved? Never  Is APS involved or ever been involved? Never   Patient Determined To Be At Risk for Harm To Self or Others Based on Review of Patient Reported Information or Presenting Complaint? No  Method: No data recorded Availability of Means: No data recorded Intent: No data recorded Notification Required: No data recorded Additional Information for Danger to Others Potential: No data recorded Additional Comments for Danger to Others Potential: No data recorded Are There Guns or Other Weapons in Your Home? No data recorded Types of Guns/Weapons: No data recorded Are These Weapons Safely Secured?                            No data recorded Who Could Verify You Are Able To Have These Secured: No data recorded Do You Have any Outstanding Charges, Pending Court Dates, Parole/Probation? No data recorded Contacted To Inform of Risk of Harm To Self or Others: No data recorded  Location of Assessment: [facility] ED   Does Patient Present under Involuntary Commitment? Yes  IVC Papers Initial File Date: 01/08/2021   [place] of Residence: [place]   Patient Currently Receiving the Following Services: Not Receiving Services   Determination of Need: Emergent (2 hours)   Options For Referral: ED Visit; Inpatient Hospitalization; Medication Management     CCA Biopsychosocial Intake/Chief Complaint:  Patient reports he was brought to the ED for a "regular check up."  Current Symptoms/Problems: Bizarre behavior   Patient Reported Schizophrenia/Schizoaffective Diagnosis in Past: Yes   Strengths: No data recorded Preferences: No data recorded Abilities: No data recorded  Type of Services Patient Feels are Needed: No data  recorded  Initial Clinical Notes/Concerns: No data recorded  Mental Health Symptoms Depression:  None   Duration of Depressive symptoms: No data recorded  Mania:  Euphoria   Anxiety:   No data recorded  Psychosis:  Affective flattening/alogia/avolition   Duration of Psychotic symptoms: Greater than six months   Trauma:  None   Obsessions:  None   Compulsions:  Poor Insight   Inattention:  Disorganized   Hyperactivity/Impulsivity:  N/A   Oppositional/Defiant Behaviors:  None   Emotional Irregularity:  None   Other Mood/Personality Symptoms:  No data recorded   Mental Status Exam Appearance and self-care  Stature:  Average   Weight:  Average weight   Clothing:  Casual   Grooming:  Normal   Cosmetic use:  None   Posture/gait:  Normal   Motor activity:  Slowed   Sensorium  Attention:  Distractible   Concentration:  Scattered   Orientation:  X5   Recall/memory:  Defective in Recent; Defective in Remote   Affect and Mood  Affect:  Depressed; Flat; Blunted   Mood:  Depressed   Relating  Eye contact:  Normal   Facial expression:  Constricted   Attitude toward examiner:  Cooperative   Thought and Language  Speech flow: Slow   Thought content:  Appropriate to  Mood and Circumstances   Preoccupation:  None   Hallucinations:  None   Organization:  No data recorded  [employer] of Knowledge:  Average   Intelligence:  Average   Abstraction:  Functional   Judgement:  Poor   Reality Testing:  Distorted   Insight:  Poor   Decision Making:  Confused   Social Functioning  Social Maturity:  Irresponsible   Social Judgement:  Normal   Stress  Stressors:  Family conflict; Housing   Coping Ability:  No data recorded  Skill Deficits:  Communication; Decision making   Supports:  Family     Religion:    Leisure/Recreation:    Exercise/Diet: Exercise/Diet Do You Exercise?: No Have You Gained or Lost A Significant  Amount of Weight in the Past Six Months?: No Do You Follow a Special Diet?: No Do You Have Any Trouble Sleeping?: No   CCA Employment/Education Employment/Work Situation: Employment / Work Situation Employment situation: Unemployed  Education:     CCA Family/Childhood History Family and Relationship History:    Childhood History:     Child/Adolescent Assessment:     CCA Substance Use Alcohol/Drug Use: Alcohol / Drug Use Pain Medications: See mar Prescriptions: see mar Over the Counter: see mar History of alcohol / drug use?: Yes Longest period of sobriety (when/how long): UTA Negative Consequences of Use: Personal relationships,Financial Substance #1 Name of Substance 1: unk                       ASAM's:  Six Dimensions of Multidimensional Assessment  Dimension 1:  Acute Intoxication and/or Withdrawal Potential:      Dimension 2:  Biomedical Conditions and Complications:      Dimension 3:  Emotional, Behavioral, or Cognitive Conditions and Complications:     Dimension 4:  Readiness to Change:     Dimension 5:  Relapse, Continued use, or Continued Problem Potential:     Dimension 6:  Recovery/Living Environment:     ASAM Severity Score:    ASAM Recommended Level of Treatment:     Substance use Disorder (SUD) Substance Use Disorder (SUD)  Checklist Symptoms of Substance Use: Evidence of withdrawal (Comment)  Recommendations for Services/Supports/Treatments: Recommendations for Services/Supports/Treatments Recommendations For Services/Supports/Treatments: Medication Management,SAIOP (Substance Abuse Intensive Outpatient Program),Inpatient Hospitalization,Individual Therapy  DSM5 Diagnoses: Patient Active Problem List   Diagnosis Date Noted  . 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 Schizophrenia (HCC) 10/04/2020  . 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 Bizarre behavior 12/02/2019  . Involuntary commitment     Patient Centered Plan: Patient is on  the following Treatment Plan(s):     Referrals to Alternative Service(s): Referred to Alternative Service(s):   Place:   Date:   Time:    Referred to Alternative Service(s):   Place:   Date:   Time:    Referred to Alternative Service(s):   Place:   Date:   Time:    Referred to Alternative Service(s):   Place:   Date:   Time:     Eric Pineda Dierdre Searles, Counselor, LCAS-A

## 2021-01-08 NOTE — ED Notes (Signed)
Patient in room awake. RN attempted to obtain ordered covid test and blood work. Supplies taken to room and patient adamantly refused treatment. He stated that he was scared and that he was fine. Patient has been refusing labs since arrival to ED.

## 2021-01-08 NOTE — ED Notes (Signed)
Gave pt ginger ale.  

## 2021-01-08 NOTE — ED Notes (Signed)
IVC/ Pending Inpt Admit/ Moved to [facility]-1

## 2021-01-08 NOTE — ED Notes (Signed)
Report received from Bill, RN including SBAR. Patient alert and oriented, warm and dry, and in no acute distress. Patient denies SI, HI, AVH and pain. Patient made aware of Q15 minute rounds and Rover and Officer presence for their safety. Patient instructed to come to this nurse with needs or concerns.  

## 2021-01-08 NOTE — ED Notes (Signed)
Hourly rounding performed, patient currently awake in room. Patient has no complaints at this time. Q15 minute rounds and monitoring via Rover and Officer to continue. 

## 2021-01-08 NOTE — Consult Note (Signed)
[facility] Face-to-Face Psychiatry Consult   Reason for Consult: Consult for 25 year old man with a history of schizophrenia brought in under IVC filed by family because of disorganized behavior Referring Physician: Derrill Kay [name] MRN:  [id number] Principal Diagnosis: Schizophrenia (HCC) Diagnosis:  Principal Problem:   Schizophrenia (HCC) Active Problems:   Bizarre behavior   Involuntary commitment   Schizophreniform disorder (HCC)   Total Time spent with patient: 45 minutes  Subjective:   Eric Pineda is a 25 y.o. male patient admitted with "I just need a checkup".  HPI: Patient seen chart reviewed.  Patient known from previous encounters.  25 year old man petitioned by his family reporting that he is not taking his medicine and that he is acting bizarrely that he talks to people who were not there.  They suspect he may be on drugs.  On interview today the patient makes no eye contact and is flat and blunted.  At times he almost looks like he is going back into his catatonic state.  He tells me that he came to the hospital "for a checkup".  I discussed with him the concerns on the involuntary commitment and he has no reply to them.  I pointed out to him that he is just not functioning at all normally in his life and he has no reply.  He denies he is having hallucinations.  Denies suicidal or homicidal ideation.  Admits he has not followed up with any mental health care.  He denies any alcohol or drug abuse.  Past Psychiatric History: Patient has a history of several prior hospitalizations for schizophrenia.  Gradually the picture is becoming more clear.  He has very significant negative symptomatology.  At times he has had full catatonic spells but he also even when he is not catatonic will be very withdrawn and flat little communication very little verbality.  No history of violence or suicidal attempts.  Did well last time he was in the hospital on long-acting  injectables but it has worn off since then  Risk to Self:   Risk to Others:   Prior Inpatient Therapy:   Prior Outpatient Therapy:    Past Medical History:  Past Medical History:  Diagnosis Date  . Bizarre behavior 12/02/2019   History reviewed. No pertinent surgical history. Family History: History reviewed. No pertinent family history. Family Psychiatric  History: None reported Social History:  Social History   Substance and Sexual Activity  Alcohol Use None     Social History   Substance and Sexual Activity  Drug Use Not on file    Social History   Socioeconomic History  . Marital status: Single    Spouse name: Not on file  . Number of children: Not on file  . Years of education: Not on file  . Highest education level: Not on file  Occupational History  . Not on file  Tobacco Use  . Smoking status: Never Smoker  . Smokeless tobacco: Never Used  Substance and Sexual Activity  . Alcohol use: Not on file  . Drug use: Not on file  . Sexual activity: Not on file  Other Topics Concern  . Not on file  Social History Narrative  . Not on file   Social Determinants of Health   Financial Resource Strain: Not on file  Food Insecurity: Not on file  Transportation Needs: Not on file  Physical Activity: Not on file  Stress: Not on file  Social Connections: Not on file   Additional Social  History:    Allergies:  No Known Allergies  Labs:  Results for orders placed or performed during the hospital encounter of 01/07/21 (from the past 48 hour(s))  Urine Drug Screen, Qualitative     Status: None   Collection Time: 01/07/21 10:21 PM  Result Value Ref Range   Tricyclic, Ur Screen NONE DETECTED NONE DETECTED   Amphetamines, Ur Screen NONE DETECTED NONE DETECTED   MDMA (Ecstasy)Ur Screen NONE DETECTED NONE DETECTED   Cocaine Metabolite,Ur [place] NONE DETECTED NONE DETECTED   Opiate, Ur Screen NONE DETECTED NONE DETECTED   Phencyclidine (PCP) Ur S NONE DETECTED NONE  DETECTED   Cannabinoid 50 Ng, Ur [place] NONE DETECTED NONE DETECTED   Barbiturates, Ur Screen NONE DETECTED NONE DETECTED   Benzodiazepine, Ur Scrn NONE DETECTED NONE DETECTED   Methadone Scn, Ur NONE DETECTED NONE DETECTED    Comment: (NOTE) Tricyclics + metabolites, urine    Cutoff 1000 ng/mL Amphetamines + metabolites, urine  Cutoff 1000 ng/mL MDMA (Ecstasy), urine              Cutoff 500 ng/mL Cocaine Metabolite, urine          Cutoff 300 ng/mL Opiate + metabolites, urine        Cutoff 300 ng/mL Phencyclidine (PCP), urine         Cutoff 25 ng/mL Cannabinoid, urine                 Cutoff 50 ng/mL Barbiturates + metabolites, urine  Cutoff 200 ng/mL Benzodiazepine, urine              Cutoff 200 ng/mL Methadone, urine                   Cutoff 300 ng/mL  The urine drug screen provides only a preliminary, unconfirmed analytical test result and should not be used for non-medical purposes. Clinical consideration and professional judgment should be applied to any positive drug screen result due to possible interfering substances. A more specific alternate chemical method must be used in order to obtain a confirmed analytical result. Gas chromatography / mass spectrometry (GC/MS) is the preferred confirm atory method. Performed at [facility], [address] [place], [place] [address]     Current Facility-Administered Medications  Medication Dose Route Frequency Provider Last Rate Last Admin  . paliperidone (INVEGA SUSTENNA) injection 234 mg  234 mg Intramuscular Once Shayaan Parke, Jackquline Denmark, MD       Current Outpatient Medications  Medication Sig Dispense Refill  . hydrOXYzine (ATARAX/VISTARIL) 25 MG tablet Take 1 tablet (25 mg total) by mouth 3 (three) times daily as needed for anxiety. 90 tablet 1  . LORazepam (ATIVAN) 1 MG tablet Take 1 tablet (1 mg total) by mouth 2 (two) times daily. 14 tablet 0  . paliperidone (INVEGA SUSTENNA) 234 MG/1.5ML SUSY injection Inject 234 mg into the  muscle every 30 (thirty) days. 1.5 mL 3  . traZODone (DESYREL) 50 MG tablet Take 1 tablet (50 mg total) by mouth at bedtime as needed for sleep. 30 tablet 1    Musculoskeletal: Strength & Muscle Tone: within normal limits Gait & Station: normal Patient leans: N/A            Psychiatric Specialty Exam:  Presentation  General Appearance: Bizarre; Fairly Groomed  Eye Contact:Fair  Speech:Blocked; Slow  Speech Volume:Decreased  Handedness:Right   Mood and Affect  Mood:Depressed; Euphoric  Affect:Blunt; Inappropriate; Labile   Thought Process  Thought Processes:Disorganized  Descriptions of Associations:Circumstantial  Orientation:Full (Time, Place and Person)  Thought Content:Delusions; Illogical; Scattered  History of Schizophrenia/Schizoaffective disorder:Yes  Duration of Psychotic Symptoms:Greater than six months  Hallucinations:Hallucinations: Auditory  Ideas of Reference:Paranoia  Suicidal Thoughts:Suicidal Thoughts: No  Homicidal Thoughts:Homicidal Thoughts: No   Sensorium  Memory:Immediate Poor; Recent Poor; Remote Poor  Judgment:Poor  Insight:Lacking   Executive Functions  Concentration:Poor  Attention Span:Fair  Recall:Fair  Fund of Knowledge:Poor  Language:Fair   Psychomotor Activity  Psychomotor Activity:Psychomotor Activity: Normal   Assets  Assets:Communication Skills; Desire for Improvement; Financial Resources/Insurance; Resilience; Social Support   Sleep  Sleep:Sleep: Fair   Physical Exam: Physical Exam Vitals and nursing note reviewed.  Constitutional:      Appearance: Normal appearance.  HENT:     Head: Normocephalic and atraumatic.     Mouth/Throat:     Pharynx: Oropharynx is clear.  Eyes:     Pupils: Pupils are equal, round, and reactive to light.  Cardiovascular:     Rate and Rhythm: Normal rate and regular rhythm.  Pulmonary:     Effort: Pulmonary effort is normal.     Breath sounds: Normal  breath sounds.  Abdominal:     General: Abdomen is flat.     Palpations: Abdomen is soft.  Musculoskeletal:        General: Normal range of motion.  Skin:    General: Skin is warm and dry.  Neurological:     General: No focal deficit present.     Mental Status: He is alert. Mental status is at baseline.  Psychiatric:        Attention and Perception: He is inattentive.        Mood and Affect: Mood normal. Affect is blunt.        Speech: Speech is delayed.        Behavior: Behavior is slowed and withdrawn.        Thought Content: Thought content does not include homicidal or suicidal ideation.        Cognition and Memory: Cognition is impaired. Memory is impaired.        Judgment: Judgment is inappropriate.    Review of Systems  Constitutional: Negative.   HENT: Negative.   Eyes: Negative.   Respiratory: Negative.   Cardiovascular: Negative.   Gastrointestinal: Negative.   Musculoskeletal: Negative.   Skin: Negative.   Neurological: Negative.   Psychiatric/Behavioral: Negative for hallucinations, substance abuse and suicidal ideas. The patient is not nervous/anxious and does not have insomnia.    Blood pressure 96/75, pulse (!) 56, temperature 97.7 F (36.5 C), temperature source Oral, resp. rate 16, height 5\' 11"  (1.803 m), weight 73.5 kg, SpO2 100 %. Body mass index is 22.6 kg/m.  Treatment Plan Summary: Daily contact with patient to assess and evaluate symptoms and progress in treatment, Medication management and Plan This is a 25 year old man with schizophrenia who is medicine noncompliant and as a result is increasingly nonfunctional.  During our conversation at times he seemed to be getting close to being catatonic making a few stereotyped movements not responding for minutes at a time but ultimately he was able to talk although his insight is pretty much nonexistent.  Patient will be given his long-acting Invega injection 234 mg.  If he becomes catatonic we will treat it  with Ativan.  Because of the severity of his dysfunction we will continue the commitment and try to admit him to the hospital.  No beds available here we will have to refer him out to other facilities.  Patient informed of the plan and makes no response.  Disposition: Recommend psychiatric Inpatient admission when medically cleared. Supportive therapy provided about ongoing stressors.  Mordecai Rasmussen, MD 01/08/2021 11:49 AM

## 2021-01-08 NOTE — ED Provider Notes (Signed)
Emergency Medicine Observation Re-evaluation Note  Eric Pineda is a 25 y.o. male, seen on rounds today.  Pt initially presented to the ED for complaints of Psychiatric Evaluation Currently, the patient is resting, voices no medical complaints.  Physical Exam  BP (!) 143/97 (BP Location: Right Arm)   Pulse 90   Temp 98 F (36.7 C) (Oral)   Resp 18   Ht 5\' 11"  (1.803 m)   Wt 73.5 kg   SpO2 99%   BMI 22.60 kg/m  Physical Exam General: Resting in no acute distress Cardiac: No cyanosis Lungs: Equal rise and fall Psych: Not agitated  ED Course / MDM  EKG:   I have reviewed the labs performed to date as well as medications administered while in observation.  Recent changes in the last 24 hours include no events overnight.  Plan  Current plan is for psychiatric admission. Patient is under full IVC at this time.   , MD 01/08/21 [phone]

## 2021-01-08 NOTE — ED Notes (Signed)
Pt still refusing medication. Asked another Rn to go try.

## 2021-01-08 NOTE — ED Notes (Signed)
Attempted to give pt medicine - pt said he was still eating and I could give it to him in 10 mins

## 2021-01-08 NOTE — ED Notes (Signed)
Report to Morrie Sheldon, RN at this time. Pt to be moved to B[facility] 1.

## 2021-01-09 ENCOUNTER — Other Ambulatory Visit: Payer: Self-pay

## 2021-01-09 ENCOUNTER — Encounter: Payer: Self-pay | Admitting: Psychiatry

## 2021-01-09 ENCOUNTER — Inpatient Hospital Stay
Admission: RE | Admit: 2021-01-09 | Discharge: 2021-01-20 | DRG: 885 | Disposition: A | Discharge status: Home / Self Care | Payer: 59 | Source: Intra-hospital | Attending: Behavioral Health | Admitting: Behavioral Health

## 2021-01-09 DIAGNOSIS — F203 Undifferentiated schizophrenia: Secondary | ICD-10-CM | POA: Diagnosis not present

## 2021-01-09 DIAGNOSIS — R4689 Other symptoms and signs involving appearance and behavior: Secondary | ICD-10-CM | POA: Diagnosis present

## 2021-01-09 DIAGNOSIS — Z9114 Patient's other noncompliance with medication regimen: Secondary | ICD-10-CM | POA: Diagnosis not present

## 2021-01-09 DIAGNOSIS — F061 Catatonic disorder due to known physiological condition: Secondary | ICD-10-CM | POA: Diagnosis present

## 2021-01-09 DIAGNOSIS — F202 Catatonic schizophrenia: Secondary | ICD-10-CM | POA: Diagnosis present

## 2021-01-09 MED ORDER — ALUM & MAG HYDROXIDE-SIMETH 200-200-20 MG/5ML PO SUSP: 30.0000 mL | ORAL | Status: DC | PRN

## 2021-01-09 MED ORDER — MAGNESIUM HYDROXIDE 400 MG/5ML PO SUSP: 30.0000 mL | Freq: Every day | ORAL | Status: DC | PRN

## 2021-01-09 MED ORDER — ACETAMINOPHEN 325 MG PO TABS
650.0000 mg | ORAL_TABLET | Freq: Four times a day (QID) | ORAL | Status: DC | PRN
  Administered 2021-01-12: 650 mg via ORAL
  Filled 2021-01-09: qty 2

## 2021-01-09 NOTE — ED Notes (Addendum)
Pt given supplies and taking shower

## 2021-01-09 NOTE — Consult Note (Signed)
[facility] Face-to-Face Psychiatry Consult   Reason for Consult: Follow-up patient with schizophrenia who has been in the emergency room awaiting admission Referring Physician: Fuller Plan [name] MRN:  [id number] Principal Diagnosis: Schizophrenia [facility]) Diagnosis:  Principal Problem:   Schizophrenia (HCC) Active Problems:   Bizarre behavior   Involuntary commitment   Schizophreniform disorder (HCC)   Total Time spent with patient: 30 minutes  Subjective:   Eric Pineda is a 25 y.o. male patient admitted with "I am feeling alright".  HPI: Patient seen for follow-up.  He is in the emergency room at.  He has been calm.  He did get his long-acting shot yesterday after at first refusing.  Still very little speech little activity but taking care of his hygiene adequately eating and going to the bathroom.  Made better eye contact today.  Still with poor insight but not fighting against admission  Past Psychiatric History: History of schizophrenia with limited compliance and insight in part one of his obstacles is a lack of resources.  He really needs to get on disability  Risk to Self:   Risk to Others:   Prior Inpatient Therapy:   Prior Outpatient Therapy:    Past Medical History:  Past Medical History:  Diagnosis Date  . Bizarre behavior 12/02/2019   History reviewed. No pertinent surgical history. Family History: History reviewed. No pertinent family history. Family Psychiatric  History: See previous Social History:  Social History   Substance and Sexual Activity  Alcohol Use None     Social History   Substance and Sexual Activity  Drug Use Not on file    Social History   Socioeconomic History  . Marital status: Single    Spouse name: Not on file  . Number of children: Not on file  . Years of education: Not on file  . Highest education level: Not on file  Occupational History  . Not on file  Tobacco Use  . Smoking status: Never Smoker  .  Smokeless tobacco: Never Used  Substance and Sexual Activity  . Alcohol use: Not on file  . Drug use: Not on file  . Sexual activity: Not on file  Other Topics Concern  . Not on file  Social History Narrative  . Not on file   Social Determinants of Health   Financial Resource Strain: Not on file  Food Insecurity: Not on file  Transportation Needs: Not on file  Physical Activity: Not on file  Stress: Not on file  Social Connections: Not on file   Additional Social History:    Allergies:  No Known Allergies  Labs:  Results for orders placed or performed during the hospital encounter of 01/07/21 (from the past 48 hour(s))  Resp Panel by RT-PCR (Flu A&B, Covid) Nasopharyngeal Swab     Status: None   Collection Time: 01/07/21  2:30 PM   Specimen: Nasopharyngeal Swab; Nasopharyngeal(NP) swabs in vial transport medium  Result Value Ref Range   SARS Coronavirus 2 by RT PCR NEGATIVE NEGATIVE    Comment: (NOTE) SARS-CoV-2 target nucleic acids are NOT DETECTED.  The SARS-CoV-2 RNA is generally detectable in upper respiratory specimens during the acute phase of infection. The lowest concentration of SARS-CoV-2 viral copies this assay can detect is 138 copies/mL. A negative result does not preclude SARS-Cov-2 infection and should not be used as the sole basis for treatment or other patient management decisions. A negative result may occur with  improper specimen collection/handling, submission of specimen other than nasopharyngeal swab,  presence of viral mutation(s) within the areas targeted by this assay, and inadequate number of viral copies(<138 copies/mL). A negative result must be combined with clinical observations, patient history, and epidemiological information. The expected result is Negative.  Fact Sheet for Patients:  BloggerCourse.comhttps://www.fda.gov/media/152166/download  Fact Sheet for Healthcare Providers:  SeriousBroker.ithttps://www.fda.gov/media/152162/download  This test is no t yet  approved or cleared by the [place] FDA and  has been authorized for detection and/or diagnosis of SARS-CoV-2 by FDA under an Emergency Use Authorization (EUA). This EUA will remain  in effect (meaning this test can be used) for the duration of the COVID-19 declaration under Section 564(b)(1) of the Act, 21 U.S.C.section 360bbb-3(b)(1), unless the authorization is terminated  or revoked sooner.       Influenza A by PCR NEGATIVE NEGATIVE   Influenza B by PCR NEGATIVE NEGATIVE    Comment: (NOTE) The Xpert Xpress SARS-CoV-2/FLU/RSV plus assay is intended as an aid in the diagnosis of influenza from Nasopharyngeal swab specimens and should not be used as a sole basis for treatment. Nasal washings and aspirates are unacceptable for Xpert Xpress SARS-CoV-2/FLU/RSV testing.  Fact Sheet for Patients: BloggerCourse.comhttps://www.fda.gov/media/152166/download  Fact Sheet for Healthcare Providers: SeriousBroker.ithttps://www.fda.gov/media/152162/download  This test is not yet approved or cleared by the [place] FDA and has been authorized for detection and/or diagnosis of SARS-CoV-2 by FDA under an Emergency Use Authorization (EUA). This EUA will remain in effect (meaning this test can be used) for the duration of the COVID-19 declaration under Section 564(b)(1) of the Act, 21 U.S.C. section 360bbb-3(b)(1), unless the authorization is terminated or revoked.  Performed at [facility], [address] [place], [place] [address]   Urine Drug Screen, Qualitative     Status: None   Collection Time: 01/07/21 10:21 PM  Result Value Ref Range   Tricyclic, Ur Screen NONE DETECTED NONE DETECTED   Amphetamines, Ur Screen NONE DETECTED NONE DETECTED   MDMA (Ecstasy)Ur Screen NONE DETECTED NONE DETECTED   Cocaine Metabolite,Ur [place] NONE DETECTED NONE DETECTED   Opiate, Ur Screen NONE DETECTED NONE DETECTED   Phencyclidine (PCP) Ur S NONE DETECTED NONE DETECTED   Cannabinoid 50 Ng, Ur [place] NONE DETECTED NONE  DETECTED   Barbiturates, Ur Screen NONE DETECTED NONE DETECTED   Benzodiazepine, Ur Scrn NONE DETECTED NONE DETECTED   Methadone Scn, Ur NONE DETECTED NONE DETECTED    Comment: (NOTE) Tricyclics + metabolites, urine    Cutoff 1000 ng/mL Amphetamines + metabolites, urine  Cutoff 1000 ng/mL MDMA (Ecstasy), urine              Cutoff 500 ng/mL Cocaine Metabolite, urine          Cutoff 300 ng/mL Opiate + metabolites, urine        Cutoff 300 ng/mL Phencyclidine (PCP), urine         Cutoff 25 ng/mL Cannabinoid, urine                 Cutoff 50 ng/mL Barbiturates + metabolites, urine  Cutoff 200 ng/mL Benzodiazepine, urine              Cutoff 200 ng/mL Methadone, urine                   Cutoff 300 ng/mL  The urine drug screen provides only a preliminary, unconfirmed analytical test result and should not be used for non-medical purposes. Clinical consideration and professional judgment should be applied to any positive drug screen result due to possible interfering substances. A  more specific alternate chemical method must be used in order to obtain a confirmed analytical result. Gas chromatography / mass spectrometry (GC/MS) is the preferred confirm atory method. Performed at [facility], [address] [place], [place] [address]   cbc     Status: None   Collection Time: 01/08/21  2:30 PM  Result Value Ref Range   WBC 6.9 4.0 - 10.5 K/uL   RBC 4.53 4.22 - 5.81 MIL/uL   Hemoglobin 13.4 13.0 - 17.0 g/dL   HCT 84.1 32.4 - 40.1 %   MCV 87.9 80.0 - 100.0 fL   MCH 29.6 26.0 - 34.0 pg   MCHC 33.7 30.0 - 36.0 g/dL   RDW 02.7 25.3 - 66.4 %   Platelets 276 150 - 400 K/uL   nRBC 0.0 0.0 - 0.2 %    Comment: Performed at [facility], [address] [place], [place] [address]  Ethanol     Status: None   Collection Time: 01/08/21  2:30 PM  Result Value Ref Range   Alcohol, Ethyl (B) <10 <10 mg/dL    Comment: (NOTE) Lowest detectable limit for serum alcohol is 10  mg/dL.  For medical purposes only. Performed at [facility], [address] [place], [place] [address]   Comprehensive metabolic panel     Status: Abnormal   Collection Time: 01/08/21  2:30 PM  Result Value Ref Range   Sodium 138 135 - 145 mmol/L   Potassium 3.6 3.5 - 5.1 mmol/L   Chloride 104 98 - 111 mmol/L   CO2 26 22 - 32 mmol/L   Glucose, Bld 107 (H) 70 - 99 mg/dL    Comment: Glucose reference range applies only to samples taken after fasting for at least 8 hours.   BUN 12 6 - 20 mg/dL   Creatinine, Ser 6.38 0.61 - 1.24 mg/dL   Calcium 9.3 8.9 - 75.6 mg/dL   Total Protein 7.1 6.5 - 8.1 g/dL   Albumin 4.2 3.5 - 5.0 g/dL   AST 18 15 - 41 U/L   ALT 13 0 - 44 U/L   Alkaline Phosphatase 70 38 - 126 U/L   Total Bilirubin 0.6 0.3 - 1.2 mg/dL   GFR, Estimated >43 >32 mL/min    Comment: (NOTE) Calculated using the CKD-EPI Creatinine Equation (2021)    Anion gap 8 5 - 15    Comment: Performed at [facility], [address] [place], [place] [address]    No current facility-administered medications for this encounter.   Current Outpatient Medications  Medication Sig Dispense Refill  . hydrOXYzine (ATARAX/VISTARIL) 25 MG tablet Take 1 tablet (25 mg total) by mouth 3 (three) times daily as needed for anxiety. 90 tablet 1  . LORazepam (ATIVAN) 1 MG tablet Take 1 tablet (1 mg total) by mouth 2 (two) times daily. 14 tablet 0  . paliperidone (INVEGA SUSTENNA) 234 MG/1.5ML SUSY injection Inject 234 mg into the muscle every 30 (thirty) days. 1.5 mL 3  . traZODone (DESYREL) 50 MG tablet Take 1 tablet (50 mg total) by mouth at bedtime as needed for sleep. 30 tablet 1    Musculoskeletal: Strength & Muscle Tone: within normal limits Gait & Station: normal Patient leans: N/A            Psychiatric Specialty Exam:  Presentation  General Appearance: Bizarre; Fairly Groomed  Eye Contact:Fair  Speech:Blocked; Slow  Speech  Volume:Decreased  Handedness:Right   Mood and Affect  Mood:Depressed; Euphoric  Affect:Blunt;  Inappropriate; Labile   Thought Process  Thought Processes:Disorganized  Descriptions of Associations:Circumstantial  Orientation:Full (Time, Place and Person)  Thought Content:Delusions; Illogical; Scattered  History of Schizophrenia/Schizoaffective disorder:Yes  Duration of Psychotic Symptoms:Greater than six months  Hallucinations:No data recorded Ideas of Reference:Paranoia  Suicidal Thoughts:No data recorded Homicidal Thoughts:No data recorded  Sensorium  Memory:Immediate Poor; Recent Poor; Remote Poor  Judgment:Poor  Insight:Lacking   Executive Functions  Concentration:Poor  Attention Span:Fair  Recall:Fair  Fund of Knowledge:Poor  Language:Fair   Psychomotor Activity  Psychomotor Activity:No data recorded  Assets  Assets:Communication Skills; Desire for Improvement; Financial Resources/Insurance; Resilience; Social Support   Sleep  Sleep:No data recorded  Physical Exam: Physical Exam Vitals and nursing note reviewed.  Constitutional:      Appearance: Normal appearance.  HENT:     Head: Normocephalic and atraumatic.     Mouth/Throat:     Pharynx: Oropharynx is clear.  Eyes:     Pupils: Pupils are equal, round, and reactive to light.  Cardiovascular:     Rate and Rhythm: Normal rate and regular rhythm.  Pulmonary:     Effort: Pulmonary effort is normal.     Breath sounds: Normal breath sounds.  Abdominal:     General: Abdomen is flat.     Palpations: Abdomen is soft.  Musculoskeletal:        General: Normal range of motion.  Skin:    General: Skin is warm and dry.  Neurological:     General: No focal deficit present.     Mental Status: He is alert. Mental status is at baseline.  Psychiatric:        Attention and Perception: He is inattentive.        Mood and Affect: Mood normal. Affect is blunt.        Behavior: Behavior is slowed.         Thought Content: Thought content normal.    Review of Systems  Constitutional: Negative.   HENT: Negative.   Eyes: Negative.   Respiratory: Negative.   Cardiovascular: Negative.   Gastrointestinal: Negative.   Musculoskeletal: Negative.   Skin: Negative.   Neurological: Negative.   Psychiatric/Behavioral: Negative.    Blood pressure 99/65, pulse 68, temperature 98 F (36.7 C), temperature source Oral, resp. rate 18, height 5\' 11"  (1.803 m), weight 73.5 kg, SpO2 99 %. Body mass index is 22.6 kg/m.  Treatment Plan Summary: Plan Orders will be placed for admission when a bed is available downstairs.  No current change to medication plan.  No new labs at this time.  Disposition: Recommend psychiatric Inpatient admission when medically cleared.  , MD 01/09/2021 11:11 AM

## 2021-01-09 NOTE — Progress Notes (Signed)
Patient admitted from [facility], report received from [place], [place]. Patient presents with a bizarre affect, patient went into the dayroom to get some water and proceeded to drink water until he vomited. Patient redirected to the assessment room. Dayroom was cleaned by staff and EVS. Patient compliant with assessment but when asked what brought him to the hospital and the patient stated he was just here for a regular checkup. Patient presents as responding to internal stimuli. Patient oriented to the unit and his room. Patient being monitored Q 15 minutes for safety per unit protocol. Pt remains safe on the unit.

## 2021-01-09 NOTE — ED Notes (Signed)
Report received from Ariel, RN including SBAR. Patient alert and oriented, warm and dry, in no acute distress. Patient denies SI, HI, AVH and pain. Patient made aware of Q15 minute rounds and security cameras for their safety. Patient instructed to come to this nurse with needs or concerns.  

## 2021-01-09 NOTE — BH Assessment (Signed)
Referral Checks:   [facility] ([phone]- [phone]), Denied due to no insurance   [facility] [phone]), Marena Chancy reports referrals have not been reviewed yet   [facility] [phone] -or- [phone]), Denied due to no insurance   [facility] [phone]) [phone] No behavioral health intake staff until after 8am   [facility] Pl[facility] ([phone]), No answer   [place] [phone] or [phone]) Per Previous TTS staff: Per Judeth Cornfield, hospital is not currently accepting outside referrals.   [facility] [phone] or [phone]) No answer, voicemail was left   [place] [phone] or [phone]), No answer   [facility] [phone]) No answer, voicemail was left

## 2021-01-09 NOTE — Tx Team (Signed)
Initial Treatment Plan 01/09/2021 10:07 PM Rhett Mutschler [id number]    PATIENT STRESSORS: Marital or family conflict Medication change or noncompliance   PATIENT STRENGTHS: Ability for insight Supportive family/friends   PATIENT IDENTIFIED PROBLEMS: Acute Psychosis  Depression                   DISCHARGE CRITERIA:  Improved stabilization in mood, thinking, and/or behavior Motivation to continue treatment in a less acute level of care  PRELIMINARY DISCHARGE PLAN: Outpatient therapy Return to previous living arrangement  PATIENT/FAMILY INVOLVEMENT: This treatment plan has been presented to and reviewed with the patient, Eric Pineda. The patient has been given the opportunity to ask questions and make suggestions.  Elmyra Ricks, RN 01/09/2021, 10:07 PM

## 2021-01-09 NOTE — Plan of Care (Signed)
Patient new to the unit tonight, hasn't had time to progress  Problem: Education: Goal: Knowledge of [facility] Education information/materials will improve Outcome: Not Progressing Goal: Emotional status will improve Outcome: Not Progressing Goal: Mental status will improve Outcome: Not Progressing Goal: Verbalization of understanding the information provided will improve Outcome: Not Progressing   Problem: Safety: Goal: Periods of time without injury will increase Outcome: Not Progressing   Problem: Activity: Goal: Will verbalize the importance of balancing activity with adequate rest periods Outcome: Not Progressing   Problem: Safety: Goal: Ability to redirect hostility and anger into socially appropriate behaviors will improve Outcome: Not Progressing Goal: Ability to remain free from injury will improve Outcome: Not Progressing   

## 2021-01-09 NOTE — ED Provider Notes (Signed)
Emergency Medicine Observation Re-evaluation Note  Eric Pineda is a 25 y.o. male, seen on rounds today.  Pt initially presented to the ED for complaints of Psychiatric Evaluation  Currently, the patient is is no acute distress. Denies any concerns at this time.  Physical Exam  Blood pressure 104/74, pulse 66, temperature 98.1 F (36.7 C), temperature source Oral, resp. rate 18, height 5\' 11"  (1.803 m), weight 73.5 kg, SpO2 99 %.  Physical Exam General: No apparent distress HEENT: moist mucous membranes CV: RRR Pulm: Normal WOB GI: soft and non tender MSK: no edema or cyanosis Neuro: face symmetric, moving all extremities     ED Course / MDM     I have reviewed the labs performed to date as well as medications administered while in observation.  Recent changes in the last 24 hours include none  Plan   Current plan is to continue to wait for psych placement  Patient is under full IVC at this time.   , MD 01/09/21 [phone]

## 2021-01-09 NOTE — ED Notes (Signed)
IVC, patient has a bed assignment for [facility] room # 322

## 2021-01-09 NOTE — ED Notes (Signed)
Patient sleeping no v-signs at this time

## 2021-01-09 NOTE — ED Notes (Signed)
Pt given meal tray.

## 2021-01-09 NOTE — BH Assessment (Signed)
Patient is to be admitted to [facility] by Psychiatric Nurse Practitioner Clapacs Attending Physician will be Dr. Neale Burly.   Patient has been assigned to room 322, by [facility] Charge Nurse Megan.   ER staff is aware of the admission:  Ariel, Patient's Nurse   Lynelle Doctor., Patient Access.

## 2021-01-10 ENCOUNTER — Encounter: Payer: Self-pay | Admitting: Psychiatry

## 2021-01-10 DIAGNOSIS — F203 Undifferentiated schizophrenia: Principal | ICD-10-CM

## 2021-01-10 MED ORDER — LORAZEPAM 1 MG PO TABS
1.0000 mg | ORAL_TABLET | Freq: Three times a day (TID) | ORAL | Status: DC
  Administered 2021-01-10 – 2021-01-13 (×11): 1 mg via ORAL
  Filled 2021-01-10 (×11): qty 1

## 2021-01-10 MED ORDER — NICOTINE 7 MG/24HR TD PT24
7.0000 mg | MEDICATED_PATCH | Freq: Every day | TRANSDERMAL | Status: DC
  Administered 2021-01-10 – 2021-01-19 (×8): 7 mg via TRANSDERMAL
  Filled 2021-01-10 (×10): qty 1

## 2021-01-10 NOTE — B[facility] Suicide Risk Assessment (Signed)
[facility] INPATIENT:  Family/Significant Other Suicide Prevention Education  Suicide Prevention Education:  Patient Refusal for Family/Significant Other Suicide Prevention Education: The patient Eric Pineda has refused to provide written consent for family/significant other to be provided Family/Significant Other Suicide Prevention Education during admission and/or prior to discharge.  Physician notified.  SPE completed with pt, as pt refused to consent to family contact. SPI pamphlet provided to pt and pt was encouraged to share information with support network, ask questions, and talk about any concerns relating to SPE. Pt denies access to guns/firearms and verbalized understanding of information provided. Mobile Crisis information also provided to pt.  Glenis Smoker 01/10/2021, 3:35 PM

## 2021-01-10 NOTE — BHH Suicide Risk Assessment (Signed)
[facility] Admission Suicide Risk Assessment   Nursing information obtained from:  Patient Demographic factors:  NA Current Mental Status:  NA Loss Factors:  NA Historical Factors:  NA Risk Reduction Factors:  NA  Total Time spent with patient: 45 minutes Principal Problem: Schizophrenia, undifferentiated (HCC) Diagnosis:  Principal Problem:   Schizophrenia, undifferentiated (HCC) Active Problems:   Catatonia  Subjective Data: 25 year old male with schizophrenia IVC'd by his family for bizarre and disorganized behavior. Overnight patient proceeded to drink water until he vomited. He was medication compliant this morning, and was able to eat breakfast. On one-on-one assessment  patient notes that he simply came in for a check-up. He has not seen a psychiatrist or taken medications since discharged from the hospital. He denies suicidal ideations, homicidal ideations, visual hallucinations, or auditory hallucinations. He appears internally preoccupied. Affect is blunted, and appearance is somewhat bizarre. He is also exhibiting signs of catatonia including staring, selective mutism, stereotypy, mannerisms, negativism, impulsivity, and intermittent withdrawal.   Continued Clinical Symptoms:  Alcohol Use Disorder Identification Test Final Score (AUDIT): 0 The "Alcohol Use Disorders Identification Test", Guidelines for Use in Primary Care, Second Edition.  World Science writer [facility]). Score between 0-7:  no or low risk or alcohol related problems. Score between 8-15:  moderate risk of alcohol related problems. Score between 16-19:  high risk of alcohol related problems. Score 20 or above:  warrants further diagnostic evaluation for alcohol dependence and treatment.   CLINICAL FACTORS:   Schizophrenia:   Paranoid or undifferentiated type Currently Psychotic Unstable or Poor Therapeutic Relationship Previous Psychiatric Diagnoses and Treatments   Musculoskeletal: Strength & Muscle Tone:  within normal limits Gait & Station: normal Patient leans: N/A  Psychiatric Specialty Exam:  Presentation  General Appearance: Bizarre  Eye Contact:Minimal  Speech:Slow; Blocked  Speech Volume:Normal  Handedness:Right   Mood and Affect  Mood:Anxious  Affect:Blunt; Inappropriate   Thought Process  Thought Processes:Goal Directed  Descriptions of Associations:Circumstantial  Orientation:Full (Time, Place and Person)  Thought Content:Scattered  History of Schizophrenia/Schizoaffective disorder:Yes  Duration of Psychotic Symptoms:Greater than six months  Hallucinations:Hallucinations: -- (Patient denies hallucinations, but appears internally preoccupied)  Ideas of Reference:Paranoia  Suicidal Thoughts:Suicidal Thoughts: No  Homicidal Thoughts:Homicidal Thoughts: No   Sensorium  Memory:Immediate Poor; Recent Poor; Remote Poor  Judgment:Poor  Insight:Lacking   Executive Functions  Concentration:Poor  Attention Span:Poor  Recall:Poor  Fund of Knowledge:Poor  Language:Poor   Psychomotor Activity  Psychomotor Activity:Psychomotor Activity: Restlessness   Assets  Assets:Desire for Improvement; Housing; Social Support   Sleep  Sleep:Sleep: Fair    Physical Exam: Physical Exam ROS Blood pressure 104/74, pulse 75, temperature 98.1 F (36.7 C), temperature source Oral, resp. rate 18, height 5\' 11"  (1.803 m), weight 73.5 kg, SpO2 99 %. Body mass index is 22.6 kg/m.   COGNITIVE FEATURES THAT CONTRIBUTE TO RISK:  Loss of executive function    SUICIDE RISK:   Minimal: No identifiable suicidal ideation.  Patients presenting with no risk factors but with morbid ruminations; may be classified as minimal risk based on the severity of the depressive symptoms  PLAN OF CARE: Continue inpatient hospitalization, see H&P for details  I certify that inpatient services furnished can reasonably be expected to improve the patient's condition.   , MD 01/10/2021, 1:26 PM

## 2021-01-10 NOTE — H&P (Addendum)
Psychiatric Admission Assessment Adult  Patient Identification: Eric GoltzCristian Pineda MRN:  [id number] Date of Evaluation:  01/10/2021 Chief Complaint:  Schizophrenia, undifferentiated (HCC) [F20.3] Principal Diagnosis: Schizophrenia, undifferentiated (HCC) Diagnosis:  Principal Problem:   Schizophrenia, undifferentiated (HCC) Active Problems:   Catatonia  CC "Just here for my check-up."  History of Present Illness: 25 year old male with schizophrenia IVC'd by his family for bizarre and disorganized behavior. Overnight patient proceeded to drink water until he vomited. He was medication compliant this morning, and was able to eat breakfast. On one-on-one assessment  patient notes that he simply came in for a check-up. He has not seen a psychiatrist or taken medications since discharged from the hospital. He denies suicidal ideations, homicidal ideations, visual hallucinations, or auditory hallucinations. He appears internally preoccupied. Affect is blunted, and appearance is somewhat bizarre. He is also exhibiting signs of catatonia including staring, selective mutism, stereotypy, mannerisms, negativism, impulsivity, and intermittent withdrawal.   Associated Signs/Symptoms: Depression Symptoms:  psychomotor retardation, Duration of Depression Symptoms: No data recorded (Hypo) Manic Symptoms:  Hallucinations, Anxiety Symptoms:  Denies Psychotic Symptoms:  Hallucinations: Auditory Paranoia, PTSD Symptoms: Negative Total Time spent with patient: 45 minutes  Past Psychiatric History: Several prior admissions for schizophrenia and catatonia. No history of violence or suicide. Significant negative symptoms of anhedonia, flat affect, and little speech. At baseline he exhibits avolition and lack of desire to be around other people. Previously stabilized on [place]vega Sustenna.   Is the patient at risk to self? Yes.    Has the patient been a risk to self in the past 6 months? Yes.    Has the patient been a  risk to self within the distant past? Yes.    Is the patient a risk to others? No.  Has the patient been a risk to others in the past 6 months? No.  Has the patient been a risk to others within the distant past? No.   Prior Inpatient Therapy:   Prior Outpatient Therapy:    Alcohol Screening: 1. How often do you have a drink containing alcohol?: Never 2. How many drinks containing alcohol do you have on a typical day when you are drinking?: 1 or 2 3. How often do you have six or more drinks on one occasion?: Never AUDIT-C Score: 0 4. How often during the last year have you found that you were not able to stop drinking once you had started?: Never 5. How often during the last year have you failed to do what was normally expected from you because of drinking?: Never 6. How often during the last year have you needed a first drink in the morning to get yourself going after a heavy drinking session?: Never 7. How often during the last year have you had a feeling of guilt of remorse after drinking?: Never 8. How often during the last year have you been unable to remember what happened the night before because you had been drinking?: Never 9. Have you or someone else been injured as a result of your drinking?: No 10. Has a relative or friend or a doctor or another health worker been concerned about your drinking or suggested you cut down?: No Alcohol Use Disorder Identification Test Final Score (AUDIT): 0 Substance Abuse History in the last 12 months:  No. Consequences of Substance Abuse: Negative Previous Psychotropic Medications: Yes  Psychological Evaluations: Yes  Past Medical History:  Past Medical History:  Diagnosis Date  . Bizarre behavior 12/02/2019   History reviewed. No pertinent surgical  history. Family History: History reviewed. No pertinent family history. Family Psychiatric  History: None reported Tobacco Screening: Have you used any form of tobacco in the last 30 days?  (Cigarettes, Smokeless Tobacco, Cigars, and/or Pipes): No Social History:  Social History   Substance and Sexual Activity  Alcohol Use None     Social History   Substance and Sexual Activity  Drug Use Not on file    Additional Social History:                           Allergies:  No Known Allergies Lab Results:  Results for orders placed or performed during the hospital encounter of 01/07/21 (from the past 48 hour(s))  cbc     Status: None   Collection Time: 01/08/21  2:30 PM  Result Value Ref Range   WBC 6.9 4.0 - 10.5 K/uL   RBC 4.53 4.22 - 5.81 MIL/uL   Hemoglobin 13.4 13.0 - 17.0 g/dL   HCT 69.6 29.5 - 28.4 %   MCV 87.9 80.0 - 100.0 fL   MCH 29.6 26.0 - 34.0 pg   MCHC 33.7 30.0 - 36.0 g/dL   RDW 13.2 44.0 - 10.2 %   Platelets 276 150 - 400 K/uL   nRBC 0.0 0.0 - 0.2 %    Comment: Performed at [facility], [address] [place], [place] [address]  Ethanol     Status: None   Collection Time: 01/08/21  2:30 PM  Result Value Ref Range   Alcohol, Ethyl (B) <10 <10 mg/dL    Comment: (NOTE) Lowest detectable limit for serum alcohol is 10 mg/dL.  For medical purposes only. Performed at [facility], [address] [place], [place] [address]   Comprehensive metabolic panel     Status: Abnormal   Collection Time: 01/08/21  2:30 PM  Result Value Ref Range   Sodium 138 135 - 145 mmol/L   Potassium 3.6 3.5 - 5.1 mmol/L   Chloride 104 98 - 111 mmol/L   CO2 26 22 - 32 mmol/L   Glucose, Bld 107 (H) 70 - 99 mg/dL    Comment: Glucose reference range applies only to samples taken after fasting for at least 8 hours.   BUN 12 6 - 20 mg/dL   Creatinine, Ser 4.74 0.61 - 1.24 mg/dL   Calcium 9.3 8.9 - 25.9 mg/dL   Total Protein 7.1 6.5 - 8.1 g/dL   Albumin 4.2 3.5 - 5.0 g/dL   AST 18 15 - 41 U/L   ALT 13 0 - 44 U/L   Alkaline Phosphatase 70 38 - 126 U/L   Total Bilirubin 0.6 0.3 - 1.2 mg/dL   GFR, Estimated >56 >38 mL/min    Comment:  (NOTE) Calculated using the CKD-EPI Creatinine Equation (2021)    Anion gap 8 5 - 15    Comment: Performed at [facility], [address] [place], [place] [address]    Blood Alcohol level:  Lab Results  Component Value Date   [facility] <10 01/08/2021   ETH <10 10/04/2020    Metabolic Disorder Labs:  Lab Results  Component Value Date   HGBA1C 5.1 12/03/2019   MPG 99.67 12/03/2019   No results found for: PROLACTIN Lab Results  Component Value Date   CHOL 171 10/04/2020   TRIG 61 10/04/2020   HDL 55 10/04/2020   CHOLHDL 3.1 10/04/2020   VLDL 12 10/04/2020   LDLCALC 104 (H) 10/04/2020  Current Medications: Current Facility-Administered Medications  Medication Dose Route Frequency Provider Last Rate Last Admin  . acetaminophen (TYLENOL) tablet 650 mg  650 mg Oral Q6H PRN Clapacs, John T, MD      . alum & mag hydroxide-simeth (MAALOX/MYLANTA) 200-200-20 MG/5ML suspension 30 mL  30 mL Oral Q4H PRN Clapacs, John T, MD      . LORazepam (ATIVAN) tablet 1 mg  1 mg Oral TID [name], MD   1 mg at 01/10/21 1143  . magnesium hydroxide (MILK OF MAGNESIA) suspension 30 mL  30 mL Oral Daily PRN Clapacs, John T, MD      . nicotine (NICODERM CQ - dosed in mg/24 hr) patch 7 mg  7 mg Transdermal Daily [name], MD       PTA Medications: Medications Prior to Admission  Medication Sig Dispense Refill Last Dose  . hydrOXYzine (ATARAX/VISTARIL) 25 MG tablet Take 1 tablet (25 mg total) by mouth 3 (three) times daily as needed for anxiety. 90 tablet 1   . LORazepam (ATIVAN) 1 MG tablet Take 1 tablet (1 mg total) by mouth 2 (two) times daily. 14 tablet 0   . paliperidone (INVEGA SUSTENNA) 234 MG/1.5ML SUSY injection Inject 234 mg into the muscle every 30 (thirty) days. 1.5 mL 3   . traZODone (DESYREL) 50 MG tablet Take 1 tablet (50 mg total) by mouth at bedtime as needed for sleep. 30 tablet 1     Musculoskeletal: Strength & Muscle Tone: within normal limits Gait &  Station: normal Patient leans: N/A            Psychiatric Specialty Exam:  Presentation  General Appearance: Bizarre  Eye Contact:Minimal  Speech:Slow; Blocked  Speech Volume:Normal  Handedness:Right   Mood and Affect  Mood:Anxious  Affect:Blunt; Inappropriate   Thought Process  Thought Processes:Goal Directed  Duration of Psychotic Symptoms: Greater than six months  Past Diagnosis of Schizophrenia or Psychoactive disorder: Yes  Descriptions of Associations:Circumstantial  Orientation:Full (Time, Place and Person)  Thought Content:Scattered  Hallucinations:Hallucinations: -- (Patient denies hallucinations, but appears internally preoccupied)  Ideas of Reference:Paranoia  Suicidal Thoughts:Suicidal Thoughts: No  Homicidal Thoughts:Homicidal Thoughts: No   Sensorium  Memory:Immediate Poor; Recent Poor; Remote Poor  Judgment:Poor  Insight:Lacking   Executive Functions  Concentration:Poor  Attention Span:Poor  Recall:Poor  Fund of Knowledge:Poor  Language:Poor   Psychomotor Activity  Psychomotor Activity:Psychomotor Activity: Restlessness   Assets  Assets:Desire for Improvement; Housing; Social Support   Sleep  Sleep:Sleep: Fair    Physical Exam: Physical Exam Vitals and nursing note reviewed.  Constitutional:      General: He is not in acute distress. HENT:     Head: Normocephalic and atraumatic.     Right Ear: External ear normal.     Left Ear: External ear normal.     Nose: Nose normal.     Mouth/Throat:     Mouth: Mucous membranes are moist.     Pharynx: Oropharynx is clear.  Eyes:     Extraocular Movements: Extraocular movements intact.     Conjunctiva/sclera: Conjunctivae normal.     Pupils: Pupils are equal, round, and reactive to light.  Cardiovascular:     Rate and Rhythm: Normal rate.     Pulses: Normal pulses.  Pulmonary:     Effort: Pulmonary effort is normal.     Breath sounds: Normal breath sounds.   Abdominal:     General: Abdomen is flat.     Palpations: Abdomen is soft.  Musculoskeletal:  General: No swelling. Normal range of motion.     Cervical back: Normal range of motion and neck supple.  Skin:    General: Skin is warm and dry.  Neurological:     General: No focal deficit present.     Mental Status: He is alert.     Gait: Gait normal.  Psychiatric:        Attention and Perception: He is inattentive.        Mood and Affect: Affect is flat.        Speech: Speech is delayed.        Behavior: Behavior is withdrawn.        Thought Content: Thought content is paranoid.        Cognition and Memory: Cognition is impaired. Memory is impaired.        Judgment: Judgment is inappropriate.    Review of Systems  Constitutional: Negative.   HENT: Negative.   Eyes: Negative.   Respiratory: Negative.   Cardiovascular: Negative.   Gastrointestinal: Negative.   Genitourinary: Negative.   Musculoskeletal: Negative.   Skin: Negative.   Neurological: Negative.   Endo/Heme/Allergies: Negative.   Psychiatric/Behavioral: Positive for hallucinations.   Blood pressure 104/74, pulse 75, temperature 98.1 F (36.7 C), temperature source Oral, resp. rate 18, height 5\' 11"  (1.803 m), weight 73.5 kg, SpO2 99 %. Body mass index is 22.6 kg/m.  Treatment Plan Summary: Daily contact with patient to assess and evaluate symptoms and progress in treatment and Medication management 25 year old male with schizophrenia presenting with symptoms of psychosis and catatonia. Invega Sustenna 234 mg IM injection given 01/08/21. Will give remainder of loading dose 156 mg IM injection on 01/13/21. Ativan 1 mg TID started for catatonia.    Observation Level/Precautions:  15 minute checks  Laboratory:  Completed in ED  Psychotherapy:    Medications:    Consultations:    Discharge Concerns:    Estimated LOS:  Other:     Physician Treatment Plan for Primary Diagnosis: Schizophrenia, undifferentiated  (HCC) Long Term Goal(s): Improvement in symptoms so as ready for discharge  Short Term Goals: Ability to identify changes in lifestyle to reduce recurrence of condition will improve, Ability to verbalize feelings will improve, Ability to disclose and discuss suicidal ideas, Ability to demonstrate self-control will improve, Ability to identify and develop effective coping behaviors will improve, Ability to maintain clinical measurements within normal limits will improve and Compliance with prescribed medications will improve  Physician Treatment Plan for Secondary Diagnosis: Principal Problem:   Schizophrenia, undifferentiated (HCC) Active Problems:   Catatonia  Long Term Goal(s): Improvement in symptoms so as ready for discharge  Short Term Goals: Ability to identify changes in lifestyle to reduce recurrence of condition will improve, Ability to verbalize feelings will improve, Ability to disclose and discuss suicidal ideas, Ability to demonstrate self-control will improve, Ability to identify and develop effective coping behaviors will improve, Ability to maintain clinical measurements within normal limits will improve and Compliance with prescribed medications will improve  I certify that inpatient services furnished can reasonably be expected to improve the patient's condition.    01/15/21, MD 4/22/202212:24 PM

## 2021-01-10 NOTE — Progress Notes (Signed)
Patient is isolative to himself and to his room. He denies SI, HI, AVH. However, it appears he is responding to internal stimuli. Patient is not easily redirectable. He has a blank stare. Patient did not attend group and is not observed to be interacting with anyone else on the unit. Patient is medication compliant and got up to eat breakfast. Support and encouragement provided. Patient remains safe on the unit at this time and q15 min safety checks are maintained.

## 2021-01-10 NOTE — B[facility] Counselor (Signed)
During completion of assessment, pt declined aftercare treatment.   [name]. [name], MSW, LCSW, LCAS 01/10/2021 3:36 PM

## 2021-01-10 NOTE — Progress Notes (Signed)
Recreation Therapy Notes  INPATIENT RECREATION TR PLAN  Patient Details Name: Eric Pineda MRN: [id number] DOB: 09-Mar-1996 Today's Date: 01/10/2021  Rec Therapy Plan Is patient appropriate for Therapeutic Recreation?: Yes Treatment times per week: at least 3 Estimated Length of Stay: 5-7 days TR Treatment/Interventions: Group participation (Comment)  Discharge Criteria Pt will be discharged from therapy if:: Discharged Treatment plan/goals/alternatives discussed and agreed upon by:: Patient/family  Discharge Summary     Ashli Selders 01/10/2021, 1:24 PM

## 2021-01-10 NOTE — Progress Notes (Signed)
Recreation Therapy Notes  Date: 01/10/2021  Time: 09:30 am   Location: Craft room     Behavioral response: N/A   Intervention Topic: Leisure   Discussion/Intervention: Patient did not attend group.   Clinical Observations/Feedback:  Patient did not attend group.   Oval Moralez LRT/CTRS        Lillion Elbert 01/10/2021 10:25 AM

## 2021-01-10 NOTE — Progress Notes (Signed)
Recreation Therapy Notes  INPATIENT RECREATION THERAPY ASSESSMENT  Patient Details Name: Eric Pineda MRN: [id number] DOB: [birth year] Today's Date: 01/10/2021       Information Obtained From: Patient  Able to Participate in Assessment/Interview: Yes  Patient Presentation: Responsive  Reason for Admission (Per Patient): Other (Comments) (A check up)  Patient Stressors:    Coping Skills:   Read  Leisure Interests (2+):  Exercise - Walking  Frequency of Recreation/Participation: Weekly  Awareness of Community Resources:  Yes  Community Resources:  Park  Current Use: Yes  If no, Barriers?:    Expressed Interest in [employer] Information: No  County of Residence:  [place]  Patient Main Form of Transportation: Other (Comment) (Parents)  Patient Strengths:  Be active  Patient Identified Areas of Improvement:  N/A  Patient Goal for Hospitalization:  Achieve more knowledge  Current SI (including self-harm):  No  Current HI:  No  Current AVH: No  Staff Intervention Plan: Group Attendance,Collaborate with Interdisciplinary Treatment Team  Consent to Intern Participation: N/A  Corina Stacy 01/10/2021, 1:23 PM

## 2021-01-10 NOTE — BHH Counselor (Signed)
Adult Comprehensive Assessment  Patient ID: Eric Pineda, male   DOB: 08-07-1996, 25 y.o.   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, petroleum / Lack of resources (include bankruptcy): Pt reports "none" Housing / Lack of housing: Pt reports "none" Physical health (include injuries & life threatening diseases): Pt reports "diabetes and high blood pressure" Social relationships: Pt reports "none" Substance abuse: Pt reports "none" Bereavement / Loss: Pt reports "my grandfather, passed away a few years ago"   Living/Environment/Situation:  Living Arrangements: Parent, other relatives Living conditions (as described by patient or guardian): Pt reports "it works" Who else lives in the home?: Pt reports "my parents and my siblings" How long has patient lived in current situation?: Pt reports "for a few months".  What is atmosphere in current home: Comfortable, Loving, Supportive, "Good".   Family History:  Marital status: Single Are you sexually active?: No What is your sexual orientation?: Pt reports "heterosexual" Does patient have children?: No   Childhood History:  By whom was/is the patient raised?: Both parents, Grandparents Description of patient's relationship with caregiver when they were a child: Pt reports "it was pretty good" Patient's description of current relationship with people who raised him/her: Pt reports "well" How were you disciplined when you got in trouble as a child/adolescent?: Pt reports "whoopings" Does patient have siblings?: Yes Number of Siblings:  3 Description of patient's current relationship with siblings: Pt reports "casual" Did patient suffer any verbal/emotional/physical/sexual abuse as a child?: No Did patient suffer from severe childhood neglect?: Yes(Pt reports "both of my parents worked") Has patient ever been sexually abused/assaulted/raped as an adolescent or adult?: No Was the patient ever a victim of a crime or a disaster?: No Witnessed domestic violence?: No Has patient been effected by domestic violence as an adult?: No   Education:  Highest grade of school patient has completed: Pt reports "middle school" Currently a student?: No Learning disability?: Yes What learning problems does patient have?: Pt reports "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 What is the longest time patient has a held a job?: Pt reports "I dont know" Did You Receive Any Psychiatric Treatment/Services While in the Military?: No Are There Guns or Other Weapons in Your Home?: No   Financial Resources:   Financial resources: family assistance Does patient have a Lawyer or guardian?: No   Alcohol/Substance Abuse:   What has been your use of drugs/alcohol within the last 12 months?: Pt denies. Alcohol/Substance Abuse Treatment Hx: Denies past history Has alcohol/substance abuse ever caused legal problems?: Yes(Pt reports "I went to jail and got fined")    Leisure/Recreation:   Do You Have Hobbies?: Yes, "walking, listening to music"   Strengths/Needs:   What is the patient's perception of their strengths?: "I read" "walking, walking, walking." Patient states these barriers may affect/interfere with their treatment: Pt denies. Patient states these barriers may affect their return to the community: Pt denies.   Discharge Plan:   Currently receiving community mental health services: No Patient  states concerns  and preferences for aftercare planning are: Pt is declining aftercare at this time. Patient states they will know when they are safe and ready for discharge when: "this was just a check up" Does patient have access to transportation?: Yes Does patient have financial barriers related to discharge medications?: Yes Patient description of barriers related to discharge medications: Chart indicates that patient does not have insurance. Will patient be returning to same living situation after discharge?: Yes  Summary/Recommendations:   Summary and Recommendations (to be completed by the evaluator): Patient is a 25 year old male from [place] [facility] [place] Chart indicates that patient has a legal guardian, however, per review of the patient is noted to not have a guardian. Patient presents to the hospital following a report of bizarre behaviors and patient declining to take his medications.  He has a primary diagnosis of Schizophrenia. During the discussion regarding his hobbies/strengths, pt presents with echolalia and repeats walking several times before stopping. Recommendations include: crisis stabilization, therapeutic milieu, encourage group attendance and participation, medication management for detox/mood stabilization and development of comprehensive mental wellness/sobriety plan.  Glenis Smoker. 01/10/2021

## 2021-01-10 NOTE — Tx Team (Signed)
Interdisciplinary Treatment and Diagnostic Plan Update  01/10/2021 Time of Session: 9:00AM Odes Lolli MRN: [id number]  Principal Diagnosis: <principal problem not specified>  Secondary Diagnoses: Active Problems:   Schizophrenia, undifferentiated ([place])   Current Medications:  Current Facility-Administered Medications  Medication Dose Route Frequency Provider Last Rate Last Admin  . acetaminophen (TYLENOL) tablet 650 mg  650 mg Oral Q6H PRN Clapacs, John T, MD      . alum & mag hydroxide-simeth (MAALOX/MYLANTA) 200-200-20 MG/5ML suspension 30 mL  30 mL Oral Q4H PRN Clapacs, John T, MD      . LORazepam (ATIVAN) tablet 1 mg  1 mg Oral TID [name], MD   1 mg at 01/10/21 0827  . magnesium hydroxide (MILK OF MAGNESIA) suspension 30 mL  30 mL Oral Daily PRN Clapacs, Madie Reno, MD       PTA Medications: Medications Prior to Admission  Medication Sig Dispense Refill Last Dose  . hydrOXYzine (ATARAX/VISTARIL) 25 MG tablet Take 1 tablet (25 mg total) by mouth 3 (three) times daily as needed for anxiety. 90 tablet 1   . LORazepam (ATIVAN) 1 MG tablet Take 1 tablet (1 mg total) by mouth 2 (two) times daily. 14 tablet 0   . paliperidone (INVEGA SUSTENNA) 234 MG/1.5ML SUSY injection Inject 234 mg into the muscle every 30 (thirty) days. 1.5 mL 3   . traZODone (DESYREL) 50 MG tablet Take 1 tablet (50 mg total) by mouth at bedtime as needed for sleep. 30 tablet 1     Patient Stressors: Marital or family conflict Medication change or noncompliance  Patient Strengths: Ability for insight Supportive family/friends  Treatment Modalities: Medication Management, Group therapy, Case management,  1 to 1 session with clinician, Psychoeducation, Recreational therapy.   Physician Treatment Plan for Primary Diagnosis: <principal problem not specified> Long Term Goal(s):     Short Term Goals:    Medication Management: Evaluate patient's response, side effects, and tolerance of medication  regimen.  Therapeutic Interventions: 1 to 1 sessions, Unit Group sessions and Medication administration.  Evaluation of Outcomes: Not Met  Physician Treatment Plan for Secondary Diagnosis: Active Problems:   Schizophrenia, undifferentiated ([place])  Long Term Goal(s):     Short Term Goals:       Medication Management: Evaluate patient's response, side effects, and tolerance of medication regimen.  Therapeutic Interventions: 1 to 1 sessions, Unit Group sessions and Medication administration.  Evaluation of Outcomes: Not Met   RN Treatment Plan for Primary Diagnosis: <principal problem not specified> Long Term Goal(s): Knowledge of disease and therapeutic regimen to maintain health will improve  Short Term Goals: Ability to remain free from injury will improve, Ability to demonstrate self-control, Ability to participate in decision making will improve, Ability to verbalize feelings will improve, Ability to identify and develop effective coping behaviors will improve and Compliance with prescribed medications will improve  Medication Management: RN will administer medications as ordered by provider, will assess and evaluate patient's response and provide education to patient for prescribed medication. RN will report any adverse and/or side effects to prescribing provider.  Therapeutic Interventions: 1 on 1 counseling sessions, Psychoeducation, Medication administration, Evaluate responses to treatment, Monitor vital signs and CBGs as ordered, Perform/monitor CIWA, COWS, AIMS and Fall Risk screenings as ordered, Perform wound care treatments as ordered.  Evaluation of Outcomes: Not Met   LCSW Treatment Plan for Primary Diagnosis: <principal problem not specified> Long Term Goal(s): Safe transition to appropriate next level of care at discharge, Engage patient in therapeutic group  addressing interpersonal concerns.  Short Term Goals: Engage patient in aftercare planning with referrals and  resources, Increase social support, Increase ability to appropriately verbalize feelings, Increase emotional regulation, Identify triggers associated with mental health/substance abuse issues and Increase skills for wellness and recovery  Therapeutic Interventions: Assess for all discharge needs, 1 to 1 time with Social worker, Explore available resources and support systems, Assess for adequacy in community support network, Educate family and significant other(s) on suicide prevention, Complete Psychosocial Assessment, Interpersonal group therapy.  Evaluation of Outcomes: Not Met   Progress in Treatment: Attending groups: No. Participating in groups: No. Taking medication as prescribed: Yes. Toleration medication: Yes. Family/Significant other contact made: No, will contact:  when given permission. Patient understands diagnosis: Yes. Discussing patient identified problems/goals with staff: No. Medical problems stabilized or resolved: Yes. Denies suicidal/homicidal ideation: Yes. Issues/concerns per patient self-inventory: No. Other: None.  New problem(s) identified: No, Describe:  none.  New Short Term/Long Term Goal(s): elimination of symptoms of psychosis, medication management for mood stabilization; development of comprehensive mental wellness/sobriety plan.  Patient Goals: Pt declined to meet with treatment team.    Discharge Plan or Barriers: CSW will assist with development of discharge/aftercare plan.  Reason for Continuation of Hospitalization: Hallucinations Medication stabilization  Estimated Length of Stay: 1-7 days  Attendees: Patient: Eric Pineda 01/10/2021 9:23 AM  Physician: Selina Cooley, MD 01/10/2021 9:23 AM  Nursing: Marla Roe, RN 01/10/2021 9:23 AM  RN Care Manager: 01/10/2021 9:23 AM  Social Worker: Chalmers Guest. Guerry Bruin, MSW, [place], [place] 01/10/2021 9:23 AM  Recreational Therapist: Devin Going, LRT  01/10/2021 9:23 AM  Other: Kiva Martinique, MSW, LCSW-A  01/10/2021 9:23 AM  Other:  01/10/2021 9:23 AM  Other: 01/10/2021 9:23 AM    Scribe for Treatment Team: Shirl Harris, LCSW 01/10/2021 9:23 AM

## 2021-01-10 NOTE — Progress Notes (Signed)
Patient requested nicotine patch. He stated that he smokes "sometimes". When asked about the number of cigarettes he smokes per day or per week, patient states he does not know. 7mg  nicotine patch was given.

## 2021-01-10 NOTE — BHH Group Notes (Signed)
LCSW Group Therapy Note  01/10/2021 2:37 PM  Type of Therapy and Topic:  Group Therapy:  Feelings around Relapse and Recovery  Participation Level:  Did Not Attend   Description of Group:    Patients in this group will discuss emotions they experience before and after a relapse. They will process how experiencing these feelings, or avoidance of experiencing them, relates to having a relapse. Facilitator will guide patients to explore emotions they have related to recovery. Patients will be encouraged to process which emotions are more powerful. They will be guided to discuss the emotional reaction significant others in their lives may have to their relapse or recovery. Patients will be assisted in exploring ways to respond to the emotions of others without this contributing to a relapse.  Therapeutic Goals: 1. Patient will identify two or more emotions that lead to a relapse for them 2. Patient will identify two emotions that result when they relapse 3. Patient will identify two emotions related to recovery 4. Patient will demonstrate ability to communicate their needs through discussion and/or role plays   Summary of Patient Progress: X  Therapeutic Modalities:   Cognitive Behavioral Therapy Solution-Focused Therapy Assertiveness Training Relapse Prevention Therapy   Simona Huh R. [name], MSW, LCSW, LCAS 01/10/2021 2:37 PM

## 2021-01-11 LAB — GLUCOSE, CAPILLARY: Glucose-Capillary: 95 mg/dL (ref 70–99)

## 2021-01-11 NOTE — Progress Notes (Signed)
[facility] MD Progress Note  01/11/2021 10:41 AM Eric Pineda  MRN:  [id number] Subjective:    Patient reports no issues in terms of his mental health. He does communication, but offers limited information. He denies bizarre behaviors prior to his admission here. When asked if his family noticed anything strange, he merely states "they are fine." Denies depression anxiety or irritability. He is taking his medications as prescribed on the unit. Reports that he did not sleep well last night. He tells me that he really just came here for a "regular check up." Denies excessive daytime sedation.   Principal Problem: Schizophrenia, undifferentiated ([facility]) Diagnosis: Principal Problem:   Schizophrenia, undifferentiated ([facility]) Active Problems:   Catatonia  Total Time spent with patient: 25 minutes  Past Medical History:  Past Medical History:  Diagnosis Date  . Bizarre behavior 12/02/2019   History reviewed. No pertinent surgical history. Family History: History reviewed. No pertinent family history.   Social History:  Social History   Substance and Sexual Activity  Alcohol Use None     Social History   Substance and Sexual Activity  Drug Use Not on file    Social History   Socioeconomic History  . Marital status: Single    Spouse name: Not on file  . Number of children: Not on file  . Years of education: Not on file  . Highest education level: Not on file  Occupational History  . Not on file  Tobacco Use  . Smoking status: Light Tobacco Smoker  . Smokeless tobacco: Never Used  . Tobacco comment: Will not specify how much he smokes  Substance and Sexual Activity  . Alcohol use: Not on file  . Drug use: Not on file  . Sexual activity: Not on file  Other Topics Concern  . Not on file  Social History Narrative  . Not on file   Social Determinants of Health   Financial Resource Strain: Not on file  Food Insecurity: Not on file  Transportation Needs: Not on file  Physical  Activity: Not on file  Stress: Not on file  Social Connections: Not on file   Additional Social History:                         Sleep: Poor  Appetite:  Fair  Current Medications: Current Facility-Administered Medications  Medication Dose Route Frequency Provider Last Rate Last Admin  . acetaminophen (TYLENOL) tablet 650 mg  650 mg Oral Q6H PRN Clapacs, John T, MD      . alum & mag hydroxide-simeth (MAALOX/MYLANTA) 200-200-20 MG/5ML suspension 30 mL  30 mL Oral Q4H PRN Clapacs, John T, MD      . LORazepam (ATIVAN) tablet 1 mg  1 mg Oral TID [name], MD   1 mg at 01/11/21 0815  . magnesium hydroxide (MILK OF MAGNESIA) suspension 30 mL  30 mL Oral Daily PRN Clapacs, John T, MD      . nicotine (NICODERM CQ - dosed in mg/24 hr) patch 7 mg  7 mg Transdermal Daily [name], MD   7 mg at 01/11/21 0800    Lab Results: No results found for this or any previous visit (from the past 48 hour(s)).  Blood Alcohol level:  Lab Results  Component Value Date   [facility] <10 01/08/2021   ETH <10 10/04/2020    Metabolic Disorder Labs: Lab Results  Component Value Date   HGBA1C 5.1 12/03/2019   MPG 99.67 12/03/2019  No results found for: PROLACTIN Lab Results  Component Value Date   CHOL 171 10/04/2020   TRIG 61 10/04/2020   HDL 55 10/04/2020   CHOLHDL 3.1 10/04/2020   VLDL 12 10/04/2020   LDLCALC 104 (H) 10/04/2020    Physical Findings: AIMS:  , ,  ,  ,    CIWA:    COWS:     Musculoskeletal: Strength & Muscle Tone: within normal limits Gait & Station: normal Patient leans: Left  Psychiatric Specialty Exam:  Presentation  General Appearance: Bizarre  Eye Contact:Minimal  Speech:Slow; Blocked  Speech Volume:Normal  Handedness:Right   Mood and Affect  Mood:Anxious  Affect:Blunt; Inappropriate   Thought Process  Thought Processes:Goal Directed  Descriptions of Associations:Circumstantial  Orientation:Full (Time, Place and  Person)  Thought Content:Scattered  History of Schizophrenia/Schizoaffective disorder:Yes  Duration of Psychotic Symptoms:Greater than six months  Hallucinations:Hallucinations: -- (Patient denies hallucinations, but appears internally preoccupied)  Ideas of Reference:Paranoia  Suicidal Thoughts:Suicidal Thoughts: No  Homicidal Thoughts:Homicidal Thoughts: No   Sensorium  Memory:Immediate Poor; Recent Poor; Remote Poor  Judgment:Poor  Insight:Lacking   Executive Functions  Concentration:Poor  Attention Span:Poor  Recall:Poor  Fund of Knowledge:Poor  Language:Poor   Psychomotor Activity  Psychomotor Activity:Psychomotor Activity: Restlessness   Assets  Assets:Desire for Improvement; Housing; Social Support   Sleep  Sleep:Sleep: Fair    Physical Exam: Physical Exam ROS Blood pressure 101/62, pulse 61, temperature 97.7 F (36.5 C), temperature source Oral, resp. rate 18, height 5\' 11"  (1.803 m), weight 73.5 kg, SpO2 95 %. Body mass index is 22.6 kg/m.   Treatment Plan Summary: Daily contact with patient to assess and evaluate symptoms and progress in treatment and Medication management 25 year old male with schizophrenia presenting with symptoms of psychosis and catatonia. Invega Sustenna 234 mg IM injection given 01/08/21. Will give remainder of loading dose 156 mg IM injection on 01/13/21. Ativan 1 mg TID started for catatonia.    4/23 No changes  CPT: 5/23  [id number], MD 01/11/2021, 10:41 AM

## 2021-01-11 NOTE — Progress Notes (Signed)
Patient alert and oriented x 4, affect is blunted He appears less anxious interacting appropriately with peers and staff. Patient's thoughts are organized and coherent no bizarre behavior noted, he was visible in the milieu, no distress noted. Patient attended evening wrap up group, ate evening snack before going to bed. 15 minutes safety checks maintained will continue to monitor.

## 2021-01-11 NOTE — Progress Notes (Addendum)
Patient is alert and oriented x 4.  He remains in his room and reserve.  He took his medication without resistant. Denies depression or anxiety.  Stated he was here to have a "regular check-up". He has minimal eye contact. Denies SI/HI.  Will continue to monitor patient every 15 minutes for safety checks and progress.

## 2021-01-11 NOTE — BHH Group Notes (Signed)
LCSW Group Therapy Note  01/11/2021 2:15 PM  Type of Therapy and Topic:  Group Therapy: Avoiding Self-Sabotaging and Enabling Behaviors  Participation Level:  None   Description of Group:   In this group, patients will learn how to identify obstacles, self-sabotaging and enabling behaviors, as well as: what are they, why do we do them and what needs these behaviors meet. Discuss unhealthy relationships and how to have positive healthy boundaries with those that sabotage and enable. Explore aspects of self-sabotage and enabling in yourself and how to limit these self-destructive behaviors in everyday life.   Therapeutic Goals: 1. Patient will identify one obstacle that relates to self-sabotage and enabling behaviors 2. Patient will identify one personal self-sabotaging or enabling behavior they did prior to admission 3. Patient will state a plan to change the above identified behavior 4. Patient will demonstrate ability to communicate their needs through discussion and/or role play.   Summary of Patient Progress: Patient presented to group more than 30 minutes late, was verbally intrusive, and left shortly after. Contributions were not productive and complementary to group discussion.     Therapeutic Modalities:   Cognitive Behavioral Therapy Person-Centered Therapy Motivational Interviewing   Gwenevere Ghazi, MSW, [place], [place] 01/11/2021 2:15 PM

## 2021-01-12 LAB — GLUCOSE, CAPILLARY: Glucose-Capillary: 91 mg/dL (ref 70–99)

## 2021-01-12 MED ORDER — POLYETHYLENE GLYCOL 3350 17 G PO PACK
17.0000 g | PACK | Freq: Every day | ORAL | Status: AC
  Administered 2021-01-12 – 2021-01-14 (×3): 17 g via ORAL
  Filled 2021-01-12 (×3): qty 1

## 2021-01-12 NOTE — Progress Notes (Addendum)
[facility] MD Progress Note  01/12/2021 10:28 AM Eric Pineda  MRN:  [id number] Subjective:    4/24 Patient was seen in his bed today. Reports doing well. However, later indicates that he was experiencing a lot of depression recently because he has a girlfriend and cannot take her to the movies because there's no theaters in his county. He is tangential. Reports experiencing some mild abdominal pain and so was not able to eat breakfast this morning. States that he not talking too much in recent days "because my girlfriend is a workaholic."   Reports having mild abn pain this AM. Denies symptoms of psychosis. No charting this weekend indicates excessive water intake.   UPDATE: pt did not go to lunch today. Says that he can not due to moderate abdominal pain. Reports constipation but can not tell me for how long this has been going on.   4/23 Patient reports no issues in terms of his mental health. He does communication, but offers limited information. He denies bizarre behaviors prior to his admission here. When asked if his family noticed anything strange, he merely states "they are fine." Denies depression anxiety or irritability. He is taking his medications as prescribed on the unit. Reports that he did not sleep well last night. He tells me that he really just came here for a "regular check up." Denies excessive daytime sedation.   Principal Problem: Schizophrenia, undifferentiated (HCC) Diagnosis: Principal Problem:   Schizophrenia, undifferentiated (HCC) Active Problems:   Catatonia  Total Time spent with patient: 27 minutes  Past Medical History:  Past Medical History:  Diagnosis Date  . Bizarre behavior 12/02/2019   History reviewed. No pertinent surgical history. Family History: History reviewed. No pertinent family history.   Social History:  Social History   Substance and Sexual Activity  Alcohol Use None     Social History   Substance and Sexual Activity  Drug Use Not on  file    Social History   Socioeconomic History  . Marital status: Single    Spouse name: Not on file  . Number of children: Not on file  . Years of education: Not on file  . Highest education level: Not on file  Occupational History  . Not on file  Tobacco Use  . Smoking status: Light Tobacco Smoker  . Smokeless tobacco: Never Used  . Tobacco comment: Will not specify how much he smokes  Substance and Sexual Activity  . Alcohol use: Not on file  . Drug use: Not on file  . Sexual activity: Not on file  Other Topics Concern  . Not on file  Social History Narrative  . Not on file   Social Determinants of Health   Financial Resource Strain: Not on file  Food Insecurity: Not on file  Transportation Needs: Not on file  Physical Activity: Not on file  Stress: Not on file  Social Connections: Not on file   Additional Social History:                         Sleep: Poor  Appetite:  Fair  Current Medications: Current Facility-Administered Medications  Medication Dose Route Frequency Provider Last Rate Last Admin  . acetaminophen (TYLENOL) tablet 650 mg  650 mg Oral Q6H PRN Clapacs, Jackquline Denmark, MD   650 mg at 01/12/21 0900  . alum & mag hydroxide-simeth (MAALOX/MYLANTA) 200-200-20 MG/5ML suspension 30 mL  30 mL Oral Q4H PRN Clapacs, Jackquline Denmark, MD      .  LORazepam (ATIVAN) tablet 1 mg  1 mg Oral TID [name], MD   1 mg at 01/12/21 0900  . magnesium hydroxide (MILK OF MAGNESIA) suspension 30 mL  30 mL Oral Daily PRN Clapacs, John T, MD      . nicotine (NICODERM CQ - dosed in mg/24 hr) patch 7 mg  7 mg Transdermal Daily Les Pou M, MD   7 mg at 01/12/21 0900    Lab Results:  Results for orders placed or performed during the hospital encounter of 01/09/21 (from the past 48 hour(s))  Glucose, capillary     Status: None   Collection Time: 01/11/21  8:36 PM  Result Value Ref Range   Glucose-Capillary 95 70 - 99 mg/dL    Comment: Glucose reference range applies  only to samples taken after fasting for at least 8 hours.  Glucose, capillary     Status: None   Collection Time: 01/12/21  7:08 AM  Result Value Ref Range   Glucose-Capillary 91 70 - 99 mg/dL    Comment: Glucose reference range applies only to samples taken after fasting for at least 8 hours.    Blood Alcohol level:  Lab Results  Component Value Date   ETH <10 01/08/2021   ETH <10 10/04/2020    Metabolic Disorder Labs: Lab Results  Component Value Date   HGBA1C 5.1 12/03/2019   MPG 99.67 12/03/2019   No results found for: PROLACTIN Lab Results  Component Value Date   CHOL 171 10/04/2020   TRIG 61 10/04/2020   HDL 55 10/04/2020   CHOLHDL 3.1 10/04/2020   VLDL 12 10/04/2020   LDLCALC 104 (H) 10/04/2020    Physical Findings: AIMS:  , ,  ,  ,    CIWA:    COWS:     Musculoskeletal: Strength & Muscle Tone: within normal limits Gait & Station: normal Patient leans: Left  Psychiatric Specialty Exam:  Presentation  General Appearance: Bizarre  Eye Contact:Minimal  Speech:Slow; Blocked  Speech Volume:Normal  Handedness:Right   Mood and Affect  Mood:Anxious  Affect:Blunt; Inappropriate   Thought Process  Thought Processes:Goal Directed  Descriptions of Associations:Circumstantial  Orientation:Full (Time, Place and Person)  Thought Content:Scattered  History of Schizophrenia/Schizoaffective disorder:Yes  Duration of Psychotic Symptoms:Greater than six months  Hallucinations: None  Ideas of Reference:None  Suicidal Thoughts: none Homicidal Thoughts:none  Sensorium  Memory:Immediate Poor; Recent Poor; Remote Poor  Judgment:Poor  Insight:Lacking   Executive Functions  Concentration:Poor  Attention Span:Poor  Recall:Poor  Fund of Knowledge:Poor  Language:Poor   Psychomotor Activity  Psychomotor Activity:No data recorded   Assets  Assets:Desire for Improvement; Housing; Social Support   Sleep  Sleep:No data  recorded    Physical Exam: Physical Exam ROS Blood pressure (!) 108/53, pulse 62, temperature 97.6 F (36.4 C), temperature source Oral, resp. rate 18, height 5\' 11"  (1.803 m), weight 73.5 kg, SpO2 100 %. Body mass index is 22.6 kg/m.   Treatment Plan Summary: Daily contact with patient to assess and evaluate symptoms and progress in treatment and Medication management 25 year old male with schizophrenia presenting with symptoms of psychosis and catatonia. Invega Sustenna 234 mg IM injection given 01/08/21. Will give remainder of loading dose 156 mg IM injection on 01/13/21. Ativan 1 mg TID started for catatonia.    4/23 No changes  4/24 Miralax 17g PO q daily x 3 days.  Will likely need to ascertain pt's baseline through collateral.   CPT: 5/24  [id number], MD 01/12/2021, 10:28 AM

## 2021-01-12 NOTE — Progress Notes (Signed)
Patient alert and oriented x 3 with periods of confusion to situation, affect is blunted, he appears less anxious, he was noted interacting appropriately with peers and staff. Patient's thoughts are organized and coherent no bizarre behavior noted, he was visible in the milieu, no distress noted. Patient attended evening wrap up group, ate evening snack before going to bed. 15 minutes safety checks maintained will continue to monitor.

## 2021-01-12 NOTE — Progress Notes (Addendum)
Patient complained of aching in his mid abdomen this morning. He told the doctor he was constipated which was relieved with a laxative. He could not eat breakfast due to that pain. He also missed lunch and dinner. He is adamant about staying in bed all day. He stated that he "cannot walk". He appears pleasant and calm. He denies SI, HI, and AVH. Will continue to monitor with 15 minute checks.

## 2021-01-13 DIAGNOSIS — F203 Undifferentiated schizophrenia: Secondary | ICD-10-CM | POA: Diagnosis not present

## 2021-01-13 MED ORDER — LORAZEPAM 2 MG PO TABS
2.0000 mg | ORAL_TABLET | Freq: Three times a day (TID) | ORAL | Status: DC
  Administered 2021-01-13 – 2021-01-15 (×6): 2 mg via ORAL
  Filled 2021-01-13 (×6): qty 1

## 2021-01-13 MED ORDER — PALIPERIDONE PALMITATE ER 156 MG/ML IM SUSY
156.0000 mg | PREFILLED_SYRINGE | Freq: Once | INTRAMUSCULAR | Status: AC
  Administered 2021-01-13: 156 mg via INTRAMUSCULAR
  Filled 2021-01-13: qty 1

## 2021-01-13 NOTE — Plan of Care (Signed)
  Problem: Education: Goal: Mental status will improve Outcome: Progressing   Problem: Education: Goal: Verbalization of understanding the information provided will improve Outcome: Progressing   Problem: Education: Goal: Emotional status will improve Outcome: Progressing

## 2021-01-13 NOTE — [facility] Group Notes (Signed)
LCSW Group Therapy Note     01/13/2021 3:34 PM     Type of Therapy and Topic:  Group Therapy:  Overcoming Obstacles     Participation Level:  Did Not Attend     Description of Group:     In this group patients will be encouraged to explore what they see as obstacles to their own wellness and recovery. They will be guided to discuss their thoughts, feelings, and behaviors related to these obstacles. The group will process together ways to cope with barriers, with attention given to specific choices patients can make. Each patient will be challenged to identify changes they are motivated to make in order to overcome their obstacles. This group will be process-oriented, with patients participating in exploration of their own experiences as well as giving and receiving support and challenge from other group members.     Therapeutic Goals:  1.    Patient will identify personal and current obstacles as they relate to admission.  2.    Patient will identify barriers that currently interfere with their wellness or overcoming obstacles.  3.    Patient will identify feelings, thought process and behaviors related to these barriers.  4.    Patient will identify two changes they are willing to make to overcome these obstacles:        Summary of Patient Progress:  X   Therapeutic Modalities:    Cognitive Behavioral Therapy  Solution Focused Therapy  Motivational Interviewing  Relapse Prevention Therapy     Eric Pineda, MSW, LCSW-A  01/13/2021 3:34 PM

## 2021-01-13 NOTE — Progress Notes (Signed)
Patient has been isolative to his room since start of the shift. He did come out of his room for snack and spent a few moments in the milieu before returning to his room.  He denies having any SI HI AVH depression anxiety or pain at this encounter. He reports having some stomach discomfort earlier in the day but reports feeling better. He has not HS medications currently ordered and states he does not need anything to help aid him in sleep.  He was encourage to come to staff with any needs or concerns. He will continue to be monitored with 15 minute safety rounds.    Cleo Butler-Nicholson, LPN

## 2021-01-13 NOTE — Progress Notes (Signed)
Patient is isolative to his room and will not get up for meals or medications. Patient was brought fluids and medications. He denies SI, HI, AVH. He is endorsing some abd pain but does not need pain medication for it. Patient remains medication compliant. Support and encouragement provided. Patient remains safe on the unit at this time and q15 min safety checks are maintained.

## 2021-01-13 NOTE — Progress Notes (Signed)
[facility] MD Progress Note  01/13/2021 1:06 PM Eric Pineda  MRN:  [id number]  CC "Stomach hurts."  Subjective:  25 year old male with schizophrenia IVC'd by his family for bizarre and disorganized behavior. No acute events overnight, medication compliant. He has been isolative to room and refusing to come out for meals or medications. However, he did take medications once brought to him. He also eats some hashbrowns during our interview this morning. He states he is here for his check up, but is having stomach pain. He notes he was constipated, but this is better after getting medication. He cannot describe the location of the pain, or the symptoms to me. He denies any vomiting. He denies SI/HIAHVH, but appears internally preoccupied. He is also still exhibiting signs of catatonia.   Principal Problem: Schizophrenia, undifferentiated (HCC) Diagnosis: Principal Problem:   Schizophrenia, undifferentiated (HCC) Active Problems:   Catatonia  Total Time spent with patient: 20 minutes  Past Psychiatric History: see h&P  Past Medical History:  Past Medical History:  Diagnosis Date  . Bizarre behavior 12/02/2019   History reviewed. No pertinent surgical history. Family History: History reviewed. No pertinent family history. Family Psychiatric  History: See H&P Social History:  Social History   Substance and Sexual Activity  Alcohol Use None     Social History   Substance and Sexual Activity  Drug Use Not on file    Social History   Socioeconomic History  . Marital status: Single    Spouse name: Not on file  . Number of children: Not on file  . Years of education: Not on file  . Highest education level: Not on file  Occupational History  . Not on file  Tobacco Use  . Smoking status: Light Tobacco Smoker  . Smokeless tobacco: Never Used  . Tobacco comment: Will not specify how much he smokes  Substance and Sexual Activity  . Alcohol use: Not on file  . Drug use: Not on file  .  Sexual activity: Not on file  Other Topics Concern  . Not on file  Social History Narrative  . Not on file   Social Determinants of Health   Financial Resource Strain: Not on file  Food Insecurity: Not on file  Transportation Needs: Not on file  Physical Activity: Not on file  Stress: Not on file  Social Connections: Not on file   Additional Social History:                         Sleep: Fair  Appetite:  Poor  Current Medications: Current Facility-Administered Medications  Medication Dose Route Frequency Provider Last Rate Last Admin  . acetaminophen (TYLENOL) tablet 650 mg  650 mg Oral Q6H PRN Clapacs, Jackquline Denmark, MD   650 mg at 01/12/21 0900  . alum & mag hydroxide-simeth (MAALOX/MYLANTA) 200-200-20 MG/5ML suspension 30 mL  30 mL Oral Q4H PRN Clapacs, John T, MD      . LORazepam (ATIVAN) tablet 2 mg  2 mg Oral TID [name], MD      . magnesium hydroxide (MILK OF MAGNESIA) suspension 30 mL  30 mL Oral Daily PRN Clapacs, John T, MD      . nicotine (NICODERM CQ - dosed in mg/24 hr) patch 7 mg  7 mg Transdermal Daily [name], MD   7 mg at 01/12/21 0900  . paliperidone (INVEGA SUSTENNA) injection 156 mg  156 mg Intramuscular Once [name], MD      .  polyethylene glycol (MIRALAX / GLYCOLAX) packet 17 g  17 g Oral Daily Reggie Pile, MD   17 g at 01/13/21 7209    Lab Results:  Results for orders placed or performed during the hospital encounter of 01/09/21 (from the past 48 hour(s))  Glucose, capillary     Status: None   Collection Time: 01/11/21  8:36 PM  Result Value Ref Range   Glucose-Capillary 95 70 - 99 mg/dL    Comment: Glucose reference range applies only to samples taken after fasting for at least 8 hours.  Glucose, capillary     Status: None   Collection Time: 01/12/21  7:08 AM  Result Value Ref Range   Glucose-Capillary 91 70 - 99 mg/dL    Comment: Glucose reference range applies only to samples taken after fasting for at least 8  hours.    Blood Alcohol level:  Lab Results  Component Value Date   ETH <10 01/08/2021   ETH <10 10/04/2020    Metabolic Disorder Labs: Lab Results  Component Value Date   HGBA1C 5.1 12/03/2019   MPG 99.67 12/03/2019   No results found for: PROLACTIN Lab Results  Component Value Date   CHOL 171 10/04/2020   TRIG 61 10/04/2020   HDL 55 10/04/2020   CHOLHDL 3.1 10/04/2020   VLDL 12 10/04/2020   LDLCALC 104 (H) 10/04/2020    Physical Findings: AIMS:  , ,  ,  ,    CIWA:    COWS:     Musculoskeletal: Strength & Muscle Tone: within normal limits Gait & Station: normal Patient leans: N/A  Psychiatric Specialty Exam:  Presentation  General Appearance: Bizarre  Eye Contact:Minimal  Speech:Blocked; Slow  Speech Volume:Normal  Handedness:Right   Mood and Affect  Mood:Anxious  Affect:Constricted   Thought Process  Thought Processes:Disorganized  Descriptions of Associations:Loose  Orientation:Full (Time, Place and Person)  Thought Content:Scattered  History of Schizophrenia/Schizoaffective disorder:Yes  Duration of Psychotic Symptoms:Greater than six months  Hallucinations:Hallucinations: -- (Patient denies, but appears to be responding to internal stimuli)  Ideas of Reference:Paranoia  Suicidal Thoughts:Suicidal Thoughts: No  Homicidal Thoughts:Homicidal Thoughts: No   Sensorium  Memory:Immediate Poor; Recent Poor; Remote Poor  Judgment:Poor  Insight:Lacking   Executive Functions  Concentration:Poor  Attention Span:Poor  Recall:Poor  Fund of Knowledge:Poor  Language:Poor   Psychomotor Activity  Psychomotor Activity:Psychomotor Activity: Decreased   Assets  Assets:Housing; Physical Health; Social Support   Sleep  Sleep:Sleep: Good Number of Hours of Sleep: 8.25    Physical Exam: Physical Exam ROS Blood pressure 91/66, pulse 63, temperature 97.7 F (36.5 C), temperature source Oral, resp. rate 16, height 5\' 11"   (1.803 m), weight 73.5 kg, SpO2 99 %. Body mass index is 22.6 kg/m.   Treatment Plan Summary: Daily contact with patient to assess and evaluate symptoms and progress in treatment and Medication management  25 year old male with schizophrenia presenting with symptoms of psychosis and catatonia. Invega Sustenna 234 mg IM injection given 01/08/21. Will give remainder of loading dose 156 mg IM injection today. Increase Ativan 2 mg TID started for catatonia.    01/10/21, MD 01/13/2021, 1:06 PM

## 2021-01-14 DIAGNOSIS — F203 Undifferentiated schizophrenia: Secondary | ICD-10-CM | POA: Diagnosis not present

## 2021-01-14 LAB — CBC
HCT: 41 % (ref 39.0–52.0)
Hemoglobin: 14.1 g/dL (ref 13.0–17.0)
MCH: 29.9 pg (ref 26.0–34.0)
MCHC: 34.4 g/dL (ref 30.0–36.0)
MCV: 86.9 fL (ref 80.0–100.0)
Platelets: 296 10*3/uL (ref 150–400)
RBC: 4.72 MIL/uL (ref 4.22–5.81)
RDW: 11.8 % (ref 11.5–15.5)
WBC: 6.7 10*3/uL (ref 4.0–10.5)
nRBC: 0 % (ref 0.0–0.2)

## 2021-01-14 LAB — COMPREHENSIVE METABOLIC PANEL WITH GFR
ALT: 9 U/L (ref 0–44)
AST: 17 U/L (ref 15–41)
Albumin: 4.1 g/dL (ref 3.5–5.0)
Alkaline Phosphatase: 73 U/L (ref 38–126)
Anion gap: 8 (ref 5–15)
BUN: 18 mg/dL (ref 6–20)
CO2: 28 mmol/L (ref 22–32)
Calcium: 9.1 mg/dL (ref 8.9–10.3)
Chloride: 101 mmol/L (ref 98–111)
Creatinine, Ser: 1.24 mg/dL (ref 0.61–1.24)
GFR, Estimated: >60 mL/min
Glucose, Bld: 75 mg/dL (ref 70–99)
Potassium: 4.2 mmol/L (ref 3.5–5.1)
Sodium: 137 mmol/L (ref 135–145)
Total Bilirubin: 0.9 mg/dL (ref 0.3–1.2)
Total Protein: 7 g/dL (ref 6.5–8.1)

## 2021-01-14 NOTE — Progress Notes (Signed)
Recreation Therapy Notes  Date: 01/14/2021  Time: 09:30 am   Location: Craft room     Behavioral response: N/A   Intervention Topic: Goals   Discussion/Intervention: Patient did not attend group.   Clinical Observations/Feedback:  Patient did not attend group.   [name] LRT/CTRS         [name] 01/14/2021 10:44 AM

## 2021-01-14 NOTE — Plan of Care (Signed)
  Problem: Education: Goal: Knowledge of [facility] Education information/materials will improve Outcome: Not Progressing Goal: Emotional status will improve Outcome: Not Progressing Goal: Mental status will improve Outcome: Not Progressing Goal: Verbalization of understanding the information provided will improve Outcome: Not Progressing   Problem: Safety: Goal: Periods of time without injury will increase Outcome: Progressing   Problem: Activity: Goal: Will verbalize the importance of balancing activity with adequate rest periods Outcome: Not Progressing   Problem: Safety: Goal: Ability to remain free from injury will improve Outcome: Progressing

## 2021-01-14 NOTE — Plan of Care (Signed)
  Problem: Group Participation Goal: STG - Patient will engage in groups without prompting or encouragement from LRT x3 group sessions within 5 recreation therapy group sessions Description: STG - Patient will engage in groups without prompting or encouragement from LRT x3 group sessions within 5 recreation therapy group sessions Outcome: Not Progressing   

## 2021-01-14 NOTE — Plan of Care (Signed)
Patient during assessments this morning endorsed a normal mood and affect was mildly constricted but overall relaxed. Pt. Denied si/hi/avh and endorsed ability to continue to remain safe on the unit. Pt. Orientation appeared grossly intact. Pt. Denied physical pain and endorsed toleration of medications thus far. Pt. Endorsed no problems with sleeping and or eating at this time. Pt. Had no complaints for this Clinical research associate. Pt. Thought process is superficially linear and overall engagement is minimal.   Patient has been complaint with medications and unit procedures thus far. Pt. Has been observed eating good thus far, observed getting up for breakfast to eat. Pt. Has been able to remain safe on the unit thus far. Pt. Thus far has been observed isolative to room and withdrawn. Pt. Does not attend groups.    Q x 15 minute observation checks in place/maintained for safety. Patient is provided with education throughout shift when appropriate and able.  Patient is given/offered medications per orders. Patient is encouraged to attend groups, participate in unit activities and continue with plan of care. Pt. Chart and plans of care reviewed. Pt. Given support and encouragement when appropriate and able.     Problem: Education: Goal: Emotional status will improve Outcome: Not Progressing Note: Pt. Presents isolative and withdrawn to room. Pt. Thought process is superficially linear and overall engagement is minimal.  Goal: Mental status will improve Outcome: Not Progressing Note: Pt. Presents isolative and withdrawn to room. Pt. Thought process is superficially linear and overall engagement is minimal.    Problem: Safety: Goal: Periods of time without injury will increase Outcome: Progressing Note: Pt. Has been able to remain safe thus far on the unit.

## 2021-01-14 NOTE — Progress Notes (Signed)
Patient isolative to self and room. Did not come out for meals or snacks. Patient denies SI, HI, AVH. Pt states he is in hospital for his regular check up. Pt also continues to say he has pain in his abdominal area. No other concerns or complaints voiced. Encouragement and support provided. To patient. Patient remains safe on unit with q 15 min checks.

## 2021-01-14 NOTE — Progress Notes (Signed)
[facility] MD Progress Note  01/14/2021 1:42 PM Eric Pineda  MRN:  [id number]  CC "I'm okay"  Subjective:  25 year old male with schizophrenia IVC'd by his family for bizarre and disorganized behavior. No acute events overnight, medication compliant. Yesterday, patient had poor oral intake and was complaining of abdominal pain. However, this morning he denies any abdominal pain, and was observed eating breakfast. CBC and CMP remain within normal limits, afebrile. He continues to be a very superficial historian, and provides minimal eye contact. He will only answer "yes" or "no" to questions. He say not to having abdominal pain, nausea, vomiting, diarrhea, or constipation. He also says no to SI/HI/AH/VH.     Principal Problem: Schizophrenia, undifferentiated (HCC) Diagnosis: Principal Problem:   Schizophrenia, undifferentiated (HCC) Active Problems:   Catatonia  Total Time spent with patient: 20 minutes  Past Psychiatric History: see h&P  Past Medical History:  Past Medical History:  Diagnosis Date  . Bizarre behavior 12/02/2019   History reviewed. No pertinent surgical history. Family History: History reviewed. No pertinent family history. Family Psychiatric  History: See H&P Social History:  Social History   Substance and Sexual Activity  Alcohol Use None     Social History   Substance and Sexual Activity  Drug Use Not on file    Social History   Socioeconomic History  . Marital status: Single    Spouse name: Not on file  . Number of children: Not on file  . Years of education: Not on file  . Highest education level: Not on file  Occupational History  . Not on file  Tobacco Use  . Smoking status: Light Tobacco Smoker  . Smokeless tobacco: Never Used  . Tobacco comment: Will not specify how much he smokes  Substance and Sexual Activity  . Alcohol use: Not on file  . Drug use: Not on file  . Sexual activity: Not on file  Other Topics Concern  . Not on file  Social  History Narrative  . Not on file   Social Determinants of Health   Financial Resource Strain: Not on file  Food Insecurity: Not on file  Transportation Needs: Not on file  Physical Activity: Not on file  Stress: Not on file  Social Connections: Not on file   Additional Social History:                         Sleep: Fair  Appetite:  Poor  Current Medications: Current Facility-Administered Medications  Medication Dose Route Frequency Provider Last Rate Last Admin  . acetaminophen (TYLENOL) tablet 650 mg  650 mg Oral Q6H PRN Clapacs, Jackquline Denmark, MD   650 mg at 01/12/21 0900  . alum & mag hydroxide-simeth (MAALOX/MYLANTA) 200-200-20 MG/5ML suspension 30 mL  30 mL Oral Q4H PRN Clapacs, John T, MD      . LORazepam (ATIVAN) tablet 2 mg  2 mg Oral TID [name], MD   2 mg at 01/14/21 1120  . magnesium hydroxide (MILK OF MAGNESIA) suspension 30 mL  30 mL Oral Daily PRN Clapacs, John T, MD      . nicotine (NICODERM CQ - dosed in mg/24 hr) patch 7 mg  7 mg Transdermal Daily [name], MD   7 mg at 01/14/21 9166    Lab Results:  Results for orders placed or performed during the hospital encounter of 01/09/21 (from the past 48 hour(s))  CBC     Status: None   Collection  Time: 01/14/21  7:59 AM  Result Value Ref Range   WBC 6.7 4.0 - 10.5 K/uL   RBC 4.72 4.22 - 5.81 MIL/uL   Hemoglobin 14.1 13.0 - 17.0 g/dL   HCT 05.3 97.6 - 73.4 %   MCV 86.9 80.0 - 100.0 fL   MCH 29.9 26.0 - 34.0 pg   MCHC 34.4 30.0 - 36.0 g/dL   RDW 19.3 79.0 - 24.0 %   Platelets 296 150 - 400 K/uL   nRBC 0.0 0.0 - 0.2 %    Comment: Performed at [facility], [address] [place], [place] [address]  Comprehensive metabolic panel     Status: None   Collection Time: 01/14/21  7:59 AM  Result Value Ref Range   Sodium 137 135 - 145 mmol/L   Potassium 4.2 3.5 - 5.1 mmol/L   Chloride 101 98 - 111 mmol/L   CO2 28 22 - 32 mmol/L   Glucose, Bld 75 70 - 99 mg/dL    Comment: Glucose  reference range applies only to samples taken after fasting for at least 8 hours.   BUN 18 6 - 20 mg/dL   Creatinine, Ser 2.99 0.61 - 1.24 mg/dL   Calcium 9.1 8.9 - 24.2 mg/dL   Total Protein 7.0 6.5 - 8.1 g/dL   Albumin 4.1 3.5 - 5.0 g/dL   AST 17 15 - 41 U/L   ALT 9 0 - 44 U/L   Alkaline Phosphatase 73 38 - 126 U/L   Total Bilirubin 0.9 0.3 - 1.2 mg/dL   GFR, Estimated >68 >34 mL/min    Comment: (NOTE) Calculated using the CKD-EPI Creatinine Equation (2021)    Anion gap 8 5 - 15    Comment: Performed at [facility], [address] [place], [place] [address]    Blood Alcohol level:  Lab Results  Component Value Date   [facility] <10 01/08/2021   ETH <10 10/04/2020    Metabolic Disorder Labs: Lab Results  Component Value Date   HGBA1C 5.1 12/03/2019   MPG 99.67 12/03/2019   No results found for: PROLACTIN Lab Results  Component Value Date   CHOL 171 10/04/2020   TRIG 61 10/04/2020   HDL 55 10/04/2020   CHOLHDL 3.1 10/04/2020   VLDL 12 10/04/2020   LDLCALC 104 (H) 10/04/2020    Physical Findings: AIMS:  , ,  ,  ,    CIWA:    COWS:     Musculoskeletal: Strength & Muscle Tone: within normal limits Gait & Station: normal Patient leans: N/A  Psychiatric Specialty Exam:  Presentation  General Appearance: Bizarre  Eye Contact:Minimal  Speech:Slow Speech Volume:Normal  Handedness:Right   Mood and Affect  Mood:Anxious  Affect:Constricted   Thought Process  Thought Processes:Disorganized  Descriptions of Associations:Loose  Orientation:Full (Time, Place and Person)  Thought Content:Scattered  History of Schizophrenia/Schizoaffective disorder:Yes  Duration of Psychotic Symptoms:Greater than six months  Hallucinations:Hallucinations: -- (Patient denies, but appears to be responding to internal stimuli)  Ideas of Reference:Paranoia  Suicidal Thoughts:Suicidal Thoughts: No  Homicidal Thoughts:Homicidal Thoughts: No   Sensorium   Memory:Immediate Poor; Recent Poor; Remote Poor  Judgment:Poor  Insight:Lacking   Executive Functions  Concentration:Poor  Attention Span:Poor  Recall:Poor  Fund of Knowledge:Poor  Language:Poor   Psychomotor Activity  Psychomotor Activity:Psychomotor Activity: Decreased   Assets  Assets:Housing; Physical Health; Social Support   Sleep  Sleep:Sleep: Good Number of Hours of Sleep: 8.15    Physical Exam: Physical Exam  ROS  Blood  pressure 91/66, pulse 63, temperature 97.7 F (36.5 C), temperature source Oral, resp. rate 16, height 5\' 11"  (1.803 m), weight 73.5 kg, SpO2 99 %. Body mass index is 22.6 kg/m.   Treatment Plan Summary: Daily contact with patient to assess and evaluate symptoms and progress in treatment and Medication management  25 year old male with schizophrenia presenting with symptoms of psychosis and catatonia. Invega Sustenna 234 mg IM injection given 01/08/21. Remainder of loading dose 156 mg IM injection given 01/13/21. Continue Ativan 2 mg TID started for catatonia.    01/15/21, MD 01/14/2021, 1:42 PM

## 2021-01-15 DIAGNOSIS — F203 Undifferentiated schizophrenia: Secondary | ICD-10-CM | POA: Diagnosis not present

## 2021-01-15 MED ORDER — LORAZEPAM 1 MG PO TABS
1.5000 mg | ORAL_TABLET | Freq: Three times a day (TID) | ORAL | Status: DC
  Administered 2021-01-15 – 2021-01-16 (×3): 1.5 mg via ORAL
  Filled 2021-01-15 (×3): qty 1

## 2021-01-15 NOTE — Progress Notes (Signed)
Patient is assessed in his room. Patient is in bed and is refusing to get up for vitals, breakfast, and medications. Vitals and medications were taken while pt resting in bed. Patient refused his nicotine patch, stating "I will come get it after breakfast". Patient has not left his room this morning. He denies SI, HI, and AVH. Support and encouragement provided. Patient remains safe on the unit at this time and q15 min safety checks are maintained.

## 2021-01-15 NOTE — Progress Notes (Signed)
[facility] MD Progress Note  01/15/2021 2:21 PM Eric Pineda  MRN:  [id number]  CC "Can't get out of bed"  Subjective:  25 year old male with schizophrenia IVC'd by his family for bizarre and disorganized behavior. No acute events overnight, medication compliant. This morning patient would not get out of bed for breakfast or medications. On interview he states he cannot get out of bed due to feeling tired. He denied SI/HI/AH/VH. He denied abdominal pain today. He later got out of bed for lunch and medications. Will attempt to reduce Ativan 1.5 mg TID to avoid daytime sedation, but also provide adequate treatment for catatonia.      Principal Problem: Schizophrenia, undifferentiated (HCC) Diagnosis: Principal Problem:   Schizophrenia, undifferentiated (HCC) Active Problems:   Catatonia  Total Time spent with patient: 20 minutes  Past Psychiatric History: see h&P  Past Medical History:  Past Medical History:  Diagnosis Date  . Bizarre behavior 12/02/2019   History reviewed. No pertinent surgical history. Family History: History reviewed. No pertinent family history. Family Psychiatric  History: See H&P Social History:  Social History   Substance and Sexual Activity  Alcohol Use None     Social History   Substance and Sexual Activity  Drug Use Not on file    Social History   Socioeconomic History  . Marital status: Single    Spouse name: Not on file  . Number of children: Not on file  . Years of education: Not on file  . Highest education level: Not on file  Occupational History  . Not on file  Tobacco Use  . Smoking status: Light Tobacco Smoker  . Smokeless tobacco: Never Used  . Tobacco comment: Will not specify how much he smokes  Substance and Sexual Activity  . Alcohol use: Not on file  . Drug use: Not on file  . Sexual activity: Not on file  Other Topics Concern  . Not on file  Social History Narrative  . Not on file   Social Determinants of Health    Financial Resource Strain: Not on file  Food Insecurity: Not on file  Transportation Needs: Not on file  Physical Activity: Not on file  Stress: Not on file  Social Connections: Not on file   Additional Social History:                         Sleep: Fair  Appetite:  Poor  Current Medications: Current Facility-Administered Medications  Medication Dose Route Frequency Provider Last Rate Last Admin  . acetaminophen (TYLENOL) tablet 650 mg  650 mg Oral Q6H PRN Clapacs, Jackquline Denmark, MD   650 mg at 01/12/21 0900  . alum & mag hydroxide-simeth (MAALOX/MYLANTA) 200-200-20 MG/5ML suspension 30 mL  30 mL Oral Q4H PRN Clapacs, John T, MD      . LORazepam (ATIVAN) tablet 1.5 mg  1.5 mg Oral TID [name], MD      . magnesium hydroxide (MILK OF MAGNESIA) suspension 30 mL  30 mL Oral Daily PRN Clapacs, John T, MD      . nicotine (NICODERM CQ - dosed in mg/24 hr) patch 7 mg  7 mg Transdermal Daily [name], MD   7 mg at 01/14/21 6063    Lab Results:  Results for orders placed or performed during the hospital encounter of 01/09/21 (from the past 48 hour(s))  CBC     Status: None   Collection Time: 01/14/21  7:59 AM  Result Value  Ref Range   WBC 6.7 4.0 - 10.5 K/uL   RBC 4.72 4.22 - 5.81 MIL/uL   Hemoglobin 14.1 13.0 - 17.0 g/dL   HCT 46.5 03.5 - 46.5 %   MCV 86.9 80.0 - 100.0 fL   MCH 29.9 26.0 - 34.0 pg   MCHC 34.4 30.0 - 36.0 g/dL   RDW 68.1 27.5 - 17.0 %   Platelets 296 150 - 400 K/uL   nRBC 0.0 0.0 - 0.2 %    Comment: Performed at [facility], [address] [place], [place] [address]  Comprehensive metabolic panel     Status: None   Collection Time: 01/14/21  7:59 AM  Result Value Ref Range   Sodium 137 135 - 145 mmol/L   Potassium 4.2 3.5 - 5.1 mmol/L   Chloride 101 98 - 111 mmol/L   CO2 28 22 - 32 mmol/L   Glucose, Bld 75 70 - 99 mg/dL    Comment: Glucose reference range applies only to samples taken after fasting for at least 8 hours.   BUN  18 6 - 20 mg/dL   Creatinine, Ser 4.49 0.61 - 1.24 mg/dL   Calcium 9.1 8.9 - 67.5 mg/dL   Total Protein 7.0 6.5 - 8.1 g/dL   Albumin 4.1 3.5 - 5.0 g/dL   AST 17 15 - 41 U/L   ALT 9 0 - 44 U/L   Alkaline Phosphatase 73 38 - 126 U/L   Total Bilirubin 0.9 0.3 - 1.2 mg/dL   GFR, Estimated >91 >63 mL/min    Comment: (NOTE) Calculated using the CKD-EPI Creatinine Equation (2021)    Anion gap 8 5 - 15    Comment: Performed at [facility], [address] [place], [place] [address]    Blood Alcohol level:  Lab Results  Component Value Date   [facility] <10 01/08/2021   ETH <10 10/04/2020    Metabolic Disorder Labs: Lab Results  Component Value Date   HGBA1C 5.1 12/03/2019   MPG 99.67 12/03/2019   No results found for: PROLACTIN Lab Results  Component Value Date   CHOL 171 10/04/2020   TRIG 61 10/04/2020   HDL 55 10/04/2020   CHOLHDL 3.1 10/04/2020   VLDL 12 10/04/2020   LDLCALC 104 (H) 10/04/2020    Physical Findings: AIMS:  , ,  ,  ,    CIWA:    COWS:     Musculoskeletal: Strength & Muscle Tone: within normal limits Gait & Station: normal Patient leans: N/A  Psychiatric Specialty Exam:  Presentation  General Appearance: Bizarre  Eye Contact:Minimal  Speech:Slow Speech Volume:Normal  Handedness:Right   Mood and Affect  Mood:Anxious  Affect:Constricted   Thought Process  Thought Processes:Disorganized  Descriptions of Associations:Loose  Orientation:Full (Time, Place and Person)  Thought Content:Scattered  History of Schizophrenia/Schizoaffective disorder:Yes  Duration of Psychotic Symptoms:Greater than six months  Hallucinations:Denies Ideas of Reference:Paranoia  Suicidal Thoughts:Denies Homicidal Thoughts:Denies  Sensorium  Memory:Immediate Poor; Recent Poor; Remote Poor  Judgment:Poor  Insight:Lacking   Executive Functions  Concentration:Poor  Attention Span:Poor  Recall:Poor  Fund of  Knowledge:Poor  Language:Poor   Psychomotor Activity  Psychomotor Activity:No data recorded   Assets  Assets:Housing; Physical Health; Social Support   Sleep  Sleep:Sleep: Good Number of Hours of Sleep: 8.5    Physical Exam: Physical Exam  ROS  Blood pressure 112/64, pulse (!) 59, temperature 97.8 F (36.6 C), temperature source Oral, resp. rate 16, height 5\' 11"  (1.803 m), weight 73.5 kg, SpO2 98 %.  Body mass index is 22.6 kg/m.   Treatment Plan Summary: Daily contact with patient to assess and evaluate symptoms and progress in treatment and Medication management  25 year old male with schizophrenia presenting with symptoms of psychosis and catatonia. Invega Sustenna 234 mg IM injection given 01/08/21. Remainder of loading dose 156 mg IM injection given 01/13/21. Decrease Ativan 1.5 mg TID for catatonia.    [name], MD 01/15/2021, 2:21 PM

## 2021-01-15 NOTE — Progress Notes (Signed)
Recreation Therapy Notes   Date: 01/15/2021  Time: 10:00 am   Location: Craft room     Behavioral response: N/A   Intervention Topic: Happiness   Discussion/Intervention: Patient did not attend group.   Clinical Observations/Feedback:  Patient did not attend group.   Eric Pineda LRT/CTRS        Eric Pineda 01/15/2021 11:43 AM 

## 2021-01-15 NOTE — Progress Notes (Signed)
Patient alert and oriented x 3 with periods of confusion to situation, affect is flat he brightens upon approach,he appears less anxious, he was noted interacting appropriately with peers and staff. Patient's thoughts are organized and coherent no bizarre behavior noted, he was visible in the milieu, no distress noted, he attended evening wrap up group, ate evening snack before going to bed. 15 minutes safety checks maintained will continue to monitor.

## 2021-01-15 NOTE — Tx Team (Signed)
Interdisciplinary Treatment and Diagnostic Plan Update  01/15/2021 Time of Session: 9:00AM Eric Pineda MRN: [id number]  Principal Diagnosis: Schizophrenia, undifferentiated ([name])  Secondary Diagnoses: Principal Problem:   Schizophrenia, undifferentiated ([name]) Active Problems:   Catatonia   Current Medications:  Current Facility-Administered Medications  Medication Dose Route Frequency Provider Last Rate Last Admin  . acetaminophen (TYLENOL) tablet 650 mg  650 mg Oral Q6H PRN Clapacs, Madie [place]o, MD   650 mg at 01/12/21 0900  . alum & mag hydroxide-simeth (MAALOX/MYLANTA) 200-200-20 MG/5ML suspension 30 mL  30 mL Oral Q4H PRN Clapacs, John T, MD      . LORazepam (ATIVAN) tablet 2 mg  2 mg Oral TID [name], MD   2 mg at 01/15/21 0756  . magnesium hydroxide (MILK OF MAGNESIA) suspension 30 mL  30 mL Oral Daily PRN Clapacs, John T, MD      . nicotine (NICODERM CQ - dosed in mg/24 hr) patch 7 mg  7 mg Transdermal Daily [name], MD   7 mg at 01/14/21 0801   PTA Medications: Medications Prior to Admission  Medication Sig Dispense Refill Last Dose  . hydrOXYzine (ATARAX/VISTARIL) 25 MG tablet Take 1 tablet (25 mg total) by mouth 3 (three) times daily as needed for anxiety. 90 tablet 1   . LORazepam (ATIVAN) 1 MG tablet Take 1 tablet (1 mg total) by mouth 2 (two) times daily. 14 tablet 0   . paliperidone (INVEGA SUSTENNA) 234 MG/1.5ML SUSY injection Inject 234 mg into the muscle every 30 (thirty) days. 1.5 mL 3   . traZODone (DESYREL) 50 MG tablet Take 1 tablet (50 mg total) by mouth at bedtime as needed for sleep. 30 tablet 1     Patient Stressors: Marital or family conflict Medication change or noncompliance  Patient Strengths: Ability for insight Supportive family/friends  Treatment Modalities: Medication Management, Group therapy, Case management,  1 to 1 session with clinician, Psychoeducation, Recreational therapy.   Physician Treatment Plan for Primary  Diagnosis: Schizophrenia, undifferentiated ([place]) Long Term Goal(s): Improvement in symptoms so as ready for discharge Improvement in symptoms so as ready for discharge   Short Term Goals: Ability to identify changes in lifestyle to reduce recurrence of condition will improve Ability to verbalize feelings will improve Ability to disclose and discuss suicidal ideas Ability to demonstrate self-control will improve Ability to identify and develop effective coping behaviors will improve Ability to maintain clinical measurements within normal limits will improve Compliance with prescribed medications will improve Ability to identify changes in lifestyle to reduce recurrence of condition will improve Ability to verbalize feelings will improve Ability to disclose and discuss suicidal ideas Ability to demonstrate self-control will improve Ability to identify and develop effective coping behaviors will improve Ability to maintain clinical measurements within normal limits will improve Compliance with prescribed medications will improve  Medication Management: Evaluate patient's response, side effects, and tolerance of medication regimen.  Therapeutic Interventions: 1 to 1 sessions, Unit Group sessions and Medication administration.  Evaluation of Outcomes: Not Met  Physician Treatment Plan for Secondary Diagnosis: Principal Problem:   Schizophrenia, undifferentiated ([name]) Active Problems:   Catatonia  Long Term Goal(s): Improvement in symptoms so as ready for discharge Improvement in symptoms so as ready for discharge   Short Term Goals: Ability to identify changes in lifestyle to reduce recurrence of condition will improve Ability to verbalize feelings will improve Ability to disclose and discuss suicidal ideas Ability to demonstrate self-control will improve Ability to identify and develop effective  coping behaviors will improve Ability to maintain clinical measurements within normal  limits will improve Compliance with prescribed medications will improve Ability to identify changes in lifestyle to reduce recurrence of condition will improve Ability to verbalize feelings will improve Ability to disclose and discuss suicidal ideas Ability to demonstrate self-control will improve Ability to identify and develop effective coping behaviors will improve Ability to maintain clinical measurements within normal limits will improve Compliance with prescribed medications will improve     Medication Management: Evaluate patient's response, side effects, and tolerance of medication regimen.  Therapeutic Interventions: 1 to 1 sessions, Unit Group sessions and Medication administration.  Evaluation of Outcomes: Not Met   RN Treatment Plan for Primary Diagnosis: Schizophrenia, undifferentiated ([name]) Long Term Goal(s): Knowledge of disease and therapeutic regimen to maintain health will improve  Short Term Goals: Ability to remain free from injury will improve, Ability to demonstrate self-control, Ability to participate in decision making will improve, Ability to verbalize feelings will improve, Ability to identify and develop effective coping behaviors will improve and Compliance with prescribed medications will improve  Medication Management: RN will administer medications as ordered by provider, will assess and evaluate patient's response and provide education to patient for prescribed medication. RN will report any adverse and/or side effects to prescribing provider.  Therapeutic Interventions: 1 on 1 counseling sessions, Psychoeducation, Medication administration, Evaluate responses to treatment, Monitor vital signs and CBGs as ordered, Perform/monitor CIWA, COWS, AIMS and Fall Risk screenings as ordered, Perform wound care treatments as ordered.  Evaluation of Outcomes: Not Met   LCSW Treatment Plan for Primary Diagnosis: Schizophrenia, undifferentiated ([name]) Long Term  Goal(s): Safe transition to appropriate next level of care at discharge, Engage patient in therapeutic group addressing interpersonal concerns.  Short Term Goals: Engage patient in aftercare planning with referrals and resources, Increase social support, Increase ability to appropriately verbalize feelings, Increase emotional regulation, Identify triggers associated with mental health/substance abuse issues and Increase skills for wellness and recovery  Therapeutic Interventions: Assess for all discharge needs, 1 to 1 time with Social worker, Explore available resources and support systems, Assess for adequacy in community support network, Educate family and significant other(s) on suicide prevention, Complete Psychosocial Assessment, Interpersonal group therapy.  Evaluation of Outcomes: Not Met   Progress in Treatment: Attending groups: Yes. Participating in groups: No. Taking medication as prescribed: Yes. Toleration medication: Yes. Family/Significant other contact made: No, will contact:  when given permission. Patient understands diagnosis: No. Discussing patient identified problems/goals with staff: No. Medical problems stabilized or resolved: No. Denies suicidal/homicidal ideation: Yes. Issues/concerns per patient self-inventory: No. Other: None.  New problem(s) identified: No, Describe:  none. Update 01/15/21: no changes reported.  New Short Term/Long Term Goal(s): elimination of symptoms of psychosis, medication management for mood stabilization; development of comprehensive mental wellness/sobriety plan. Update 01/15/21: no change   Patient Goals: Pt declined to meet with treatment team.  Update 01/15/21: no changes reported.  Discharge Plan or Barriers: CSW will assist with development of discharge/aftercare plan.Update 01/15/21: no change  Reason for Continuation of Hospitalization: Hallucinations Medication stabilization  Estimated Length of Stay: 1-7  days  Attendees: Patient: Eric Pineda 01/15/2021 8:56 AM  Physician: Selina Cooley, MD 01/15/2021 8:56 AM  Nursing: Tyler Pita, RN  01/15/2021 8:56 AM  RN Care Manager: 01/15/2021 8:56 AM  Social Worker: Paulla Dolly, MSW, [place], [place]  01/15/2021 8:56 AM  Recreational Therapist: Devin Going, LRT  01/15/2021 8:56 AM  Other: Kiva [place]ique, MSW, LCSW-A 01/15/2021 8:56 AM  Other: Michell Heinrich, MSW, [place], [place]  01/15/2021 8:56 AM  Other: 01/15/2021 8:56 AM    Scribe for Treatment Team: Durenda Hurt, Latanya Presser 01/15/2021 8:56 AM

## 2021-01-15 NOTE — Plan of Care (Signed)
  Problem: Education: Goal: Emotional status will improve Outcome: Progressing   Problem: Education: Goal: Mental status will improve Outcome: Progressing   Problem: Education: Goal: Verbalization of understanding the information provided will improve Outcome: Progressing   Problem: Activity: Goal: Will verbalize the importance of balancing activity with adequate rest periods Outcome: Progressing

## 2021-01-15 NOTE — [facility] Group Notes (Signed)
LCSW Group Therapy Note  01/15/2021 2:19 PM  Type of Therapy/Topic:  Group Therapy:  Emotion Regulation  Participation Level:  Did Not Attend   Description of Group:   The purpose of this group is to assist patients in learning to regulate negative emotions and experience positive emotions. Patients will be guided to discuss ways in which they have been vulnerable to their negative emotions. These vulnerabilities will be juxtaposed with experiences of positive emotions or situations, and patients will be challenged to use positive emotions to combat negative ones. Special emphasis will be placed on coping with negative emotions in conflict situations, and patients will process healthy conflict resolution skills.  Therapeutic Goals: 1. Patient will identify two positive emotions or experiences to reflect on in order to balance out negative emotions 2. Patient will label two or more emotions that they find the most difficult to experience 3. Patient will demonstrate positive conflict resolution skills through discussion and/or role plays  Summary of Patient Progress: X  Therapeutic Modalities:   Cognitive Behavioral Therapy Feelings Identification Dialectical Behavioral Therapy  Maymie Brunke R. [name], MSW, LCSW, LCAS 01/15/2021 2:19 PM

## 2021-01-15 NOTE — Progress Notes (Signed)
Patient got out of bed to eat lunch and get his medication.

## 2021-01-15 NOTE — Progress Notes (Signed)
Patient has been more active this evening. He has been hanging out in the milieu and engaging with others.  He asked for clothes other than scrubs to wear.  He denies SI HI AVH depression and pain at this time. He also denies having any anxiety.  He does not have any medication to take at [facility] and reports not needing any medication to sleep. He continues to be monitored with 15 minute safety rounds and was encouraged to seek staff with any concerns.    Cleo Butler-Nicholson, LPN

## 2021-01-16 DIAGNOSIS — F203 Undifferentiated schizophrenia: Secondary | ICD-10-CM | POA: Diagnosis not present

## 2021-01-16 MED ORDER — LORAZEPAM 1 MG PO TABS
1.0000 mg | ORAL_TABLET | Freq: Three times a day (TID) | ORAL | Status: DC
  Administered 2021-01-16 – 2021-01-17 (×3): 1 mg via ORAL
  Filled 2021-01-16 (×3): qty 1

## 2021-01-16 NOTE — Progress Notes (Signed)
[name] Progress Note  01/16/2021 2:24 PM Eric Pineda  MRN:  [id number]  CC "When am I discharging?"  Subjective:  25 year old male with schizophrenia IVC'd by his family for bizarre and disorganized behavior. No acute events overnight, medication compliant. This morning patient would not get out of bed for breakfast or medications. However, he was compliant when medication brought to him. Later in the day around lunch time he knocks on my door spontaneously and requests to speak. He makes good eye contact, speech flows clearly without slowing or thought blocking apparent. He notes that sometimes he stays here for almost three months, and is curious what his discharge plans look like. He denies any SI/HI/AH/VH. He is able to engage in explanation of catatonia symptoms, and rationale of Ativan use. He is agreeable to try weaning off Ativan slowly to avoid daytime sedation without re-emergence of catatonic symptoms.     Principal Problem: Schizophrenia, undifferentiated (HCC) Diagnosis: Principal Problem:   Schizophrenia, undifferentiated (HCC) Active Problems:   Catatonia  Total Time spent with patient: 20 minutes  Past Psychiatric History: see h&P  Past Medical History:  Past Medical History:  Diagnosis Date  . Bizarre behavior 12/02/2019   History reviewed. No pertinent surgical history. Family History: History reviewed. No pertinent family history. Family Psychiatric  History: See H&P Social History:  Social History   Substance and Sexual Activity  Alcohol Use None     Social History   Substance and Sexual Activity  Drug Use Not on file    Social History   Socioeconomic History  . Marital status: Single    Spouse name: Not on file  . Number of children: Not on file  . Years of education: Not on file  . Highest education level: Not on file  Occupational History  . Not on file  Tobacco Use  . Smoking status: Light Tobacco Smoker  . Smokeless tobacco: Never Used  .  Tobacco comment: Will not specify how much he smokes  Substance and Sexual Activity  . Alcohol use: Not on file  . Drug use: Not on file  . Sexual activity: Not on file  Other Topics Concern  . Not on file  Social History Narrative  . Not on file   Social Determinants of Health   Financial Resource Strain: Not on file  Food Insecurity: Not on file  Transportation Needs: Not on file  Physical Activity: Not on file  Stress: Not on file  Social Connections: Not on file   Additional Social History:                         Sleep: Fair  Appetite:  Poor  Current Medications: Current Facility-Administered Medications  Medication Dose Route Frequency Provider Last Rate Last Admin  . acetaminophen (TYLENOL) tablet 650 mg  650 mg Oral Q6H PRN Clapacs, Jackquline Denmark, MD   650 mg at 01/12/21 0900  . alum & mag hydroxide-simeth (MAALOX/MYLANTA) 200-200-20 MG/5ML suspension 30 mL  30 mL Oral Q4H PRN Clapacs, John T, MD      . LORazepam (ATIVAN) tablet 1 mg  1 mg Oral TID [name], MD      . magnesium hydroxide (MILK OF MAGNESIA) suspension 30 mL  30 mL Oral Daily PRN Clapacs, John T, MD      . nicotine (NICODERM CQ - dosed in mg/24 hr) patch 7 mg  7 mg Transdermal Daily [name], MD   7 mg at  01/15/21 1551    Lab Results:  No results found for this or any previous visit (from the past 48 hour(s)).  Blood Alcohol level:  Lab Results  Component Value Date   ETH <10 01/08/2021   ETH <10 10/04/2020    Metabolic Disorder Labs: Lab Results  Component Value Date   HGBA1C 5.1 12/03/2019   MPG 99.67 12/03/2019   No results found for: PROLACTIN Lab Results  Component Value Date   CHOL 171 10/04/2020   TRIG 61 10/04/2020   HDL 55 10/04/2020   CHOLHDL 3.1 10/04/2020   VLDL 12 10/04/2020   LDLCALC 104 (H) 10/04/2020    Physical Findings: AIMS:  , ,  ,  ,    CIWA:    COWS:     Musculoskeletal: Strength & Muscle Tone: within normal limits Gait & Station:  normal Patient leans: N/A  Psychiatric Specialty Exam:  Presentation  General Appearance: Casual  Eye Contact:Good  Speech:Slow Speech Volume:Normal  Handedness:Right   Mood and Affect  Mood:Euthymic  Affect:Congruent   Thought Process  Thought Processes:Goal Directed  Descriptions of Associations:Intact  Orientation:Full (Time, Place and Person)  Thought Content:Logical  History of Schizophrenia/Schizoaffective disorder:Yes  Duration of Psychotic Symptoms:Greater than six months  Hallucinations:Denies Ideas of Reference:None  Suicidal Thoughts:Denies Homicidal Thoughts:Denies  Sensorium  Memory:Immediate Fair; Recent Fair; Remote Fair  Judgment:Intact  Insight:Shallow   Executive Functions  Concentration:Fair  Attention Span:Fair  Recall:Fair  Fund of Knowledge:Fair  Language:Fair   Psychomotor Activity  Psychomotor Activity:Psychomotor Activity: Normal   Assets  Assets:Housing; Physical Health; Social Support   Sleep  Sleep:Sleep: Good Number of Hours of Sleep: 7.3    Physical Exam: Physical Exam  ROS  Blood pressure 103/78, pulse (!) 59, temperature 97.8 F (36.6 C), temperature source Oral, resp. rate 16, height 5\' 11"  (1.803 m), weight 73.5 kg, SpO2 98 %. Body mass index is 22.6 kg/m.   Treatment Plan Summary: Daily contact with patient to assess and evaluate symptoms and progress in treatment and Medication management  25 year old male with schizophrenia presenting with symptoms of psychosis and catatonia. Invega Sustenna 234 mg IM injection given 01/08/21. Remainder of loading dose 156 mg IM injection given 01/13/21. Decrease Ativan 1 mg TID for catatonia.    01/15/21, MD 01/16/2021, 2:24 PM

## 2021-01-16 NOTE — Progress Notes (Signed)
BRIEF PHARMACY NOTE   This patient attended and participated in Medication Management Group counseling led by [facility] staff pharmacist.  This interactive class reviews basic information about prescription medications and education on personal responsibility in medication management.  The class also includes general knowledge of 3 main classes of behavioral medications, including antipsychotics, antidepressants, and mood stabilizers.     Patient behavior was appropriate for group setting.   Educational materials sourced from:  "Medication Do's and Don'ts" from [email or web address]   "Mental Health Medications" from [facility] FaxRack.[email or web address].shtml#[email or web address]    Albina Billet, PharmD, BCPS Clinical Pharmacist 01/16/2021 3:25 PM

## 2021-01-16 NOTE — [facility] Group Notes (Signed)
LCSW Group Therapy Note     01/16/2021 2:38 PM     Type of Therapy/Topic:  Group Therapy:  Balance in Life     Participation Level:  Minimal     Description of Group:    This group will address the concept of balance and how it feels and looks when one is unbalanced. Patients will be encouraged to process areas in their lives that are out of balance and identify reasons for remaining unbalanced. Facilitators will guide patients in utilizing problem-solving interventions to address and correct the stressor making their life unbalanced. Understanding and applying boundaries will be explored and addressed for obtaining and maintaining a balanced life. Patients will be encouraged to explore ways to assertively make their unbalanced needs known to significant others in their lives, using other group members and facilitator for support and feedback.     Therapeutic Goals:  1.      Patient will identify two or more emotions or situations they have that consume much of in their lives.  2.      Patient will identify signs/triggers that life has become out of balance:  3.      Patient will identify two ways to set boundaries in order to achieve balance in their lives:  4.      Patient will demonstrate ability to communicate their needs through discussion and/or role plays     Summary of Patient Progress: Patient was present for the portion of the group session. Patient did not contribute and left the group before CSW ended the session.      Therapeutic Modalities:   Cognitive Behavioral Therapy  Solution-Focused Therapy  Assertiveness Training     Mar Zettler [place] MSW, LCSW-A  01/16/2021 2:38 PM

## 2021-01-16 NOTE — Progress Notes (Signed)
Recreation Therapy Notes  Date: 01/16/2021   Time: 10:00 am   Location: Craft room     Behavioral response: N/A   Intervention Topic: Strengths    Discussion/Intervention: Patient did not attend group.   Clinical Observations/Feedback:  Patient did not attend group.   Kayin Kettering LRT/CTRS        Khaiden Segreto 01/16/2021 10:44 AM

## 2021-01-16 NOTE — Progress Notes (Addendum)
Pt is alert and oriented to person, place, time and situation. Pt is calm, cooperative, denies suicidal and homicidal ideation. Pt denies hallucinations, depression and anxiety. Eye contact is poor. Affect is blunted. Pt has been quiet, at times bizarre, will sit on the floor, or stand in front of doors staring at the doors for long periods of time, appears to be responding to internal stimuli. Pt also isolates in his room quietly in bed often. Pt is medication complaint. Pt voices no complaints. Will continue to monitor pt per Q15 minute face checks and monitor for safety and progress.

## 2021-01-17 DIAGNOSIS — F203 Undifferentiated schizophrenia: Secondary | ICD-10-CM | POA: Diagnosis not present

## 2021-01-17 MED ORDER — LORAZEPAM 0.5 MG PO TABS
0.5000 mg | ORAL_TABLET | Freq: Three times a day (TID) | ORAL | Status: DC
  Administered 2021-01-17 – 2021-01-19 (×4): 0.5 mg via ORAL
  Filled 2021-01-17 (×4): qty 1

## 2021-01-17 NOTE — [facility] Group Notes (Signed)
LCSW Group Therapy Note  01/17/2021 3:02 PM  Type of Therapy and Topic:  Group Therapy:  Feelings around Relapse and Recovery  Participation Level:  Minimal   Description of Group:    Patients in this group will discuss emotions they experience before and after a relapse. They will process how experiencing these feelings, or avoidance of experiencing them, relates to having a relapse. Facilitator will guide patients to explore emotions they have related to recovery. Patients will be encouraged to process which emotions are more powerful. They will be guided to discuss the emotional reaction significant others in their lives may have to their relapse or recovery. Patients will be assisted in exploring ways to respond to the emotions of others without this contributing to a relapse.  Therapeutic Goals: 1. Patient will identify two or more emotions that lead to a relapse for them 2. Patient will identify two emotions that result when they relapse 3. Patient will identify two emotions related to recovery 4. Patient will demonstrate ability to communicate their needs through discussion and/or role plays   Summary of Patient Progress: Patient was present for majority of group, rarely engaged in topic of discussion. Contributions were tangential to topic and often lacking credibility.     Therapeutic Modalities:   Cognitive Behavioral Therapy Solution-Focused Therapy Assertiveness Training Relapse Prevention Therapy   Gwenevere Ghazi, MSW, [place], [place] 01/17/2021 3:02 PM

## 2021-01-17 NOTE — Progress Notes (Signed)
[facility] MD Progress Note  01/17/2021 2:27 PM Eric Pineda  MRN:  [id number]  CC "I'm okay"  Subjective:  25 year old male with schizophrenia IVC'd by his family for bizarre and disorganized behavior. No acute events overnight, medication compliant. This morning patient seen out of bed, eating, and speaking with others. He is making better eye contact, and speech more coherent. Catatonia appears to be resolving. Will trial a further decrease in Ativan. He denies any SI/HI/AH/VH.   Principal Problem: Schizophrenia, undifferentiated (HCC) Diagnosis: Principal Problem:   Schizophrenia, undifferentiated (HCC) Active Problems:   Catatonia  Total Time spent with patient: 20 minutes  Past Psychiatric History: see h&P  Past Medical History:  Past Medical History:  Diagnosis Date  . Bizarre behavior 12/02/2019   History reviewed. No pertinent surgical history. Family History: History reviewed. No pertinent family history. Family Psychiatric  History: See H&P Social History:  Social History   Substance and Sexual Activity  Alcohol Use None     Social History   Substance and Sexual Activity  Drug Use Not on file    Social History   Socioeconomic History  . Marital status: Single    Spouse name: Not on file  . Number of children: Not on file  . Years of education: Not on file  . Highest education level: Not on file  Occupational History  . Not on file  Tobacco Use  . Smoking status: Light Tobacco Smoker  . Smokeless tobacco: Never Used  . Tobacco comment: Will not specify how much he smokes  Substance and Sexual Activity  . Alcohol use: Not on file  . Drug use: Not on file  . Sexual activity: Not on file  Other Topics Concern  . Not on file  Social History Narrative  . Not on file   Social Determinants of Health   Financial Resource Strain: Not on file  Food Insecurity: Not on file  Transportation Needs: Not on file  Physical Activity: Not on file  Stress: Not on file   Social Connections: Not on file   Additional Social History:                         Sleep: Fair  Appetite:  Poor  Current Medications: Current Facility-Administered Medications  Medication Dose Route Frequency Provider Last Rate Last Admin  . acetaminophen (TYLENOL) tablet 650 mg  650 mg Oral Q6H PRN Clapacs, Jackquline Denmark, MD   650 mg at 01/12/21 0900  . alum & mag hydroxide-simeth (MAALOX/MYLANTA) 200-200-20 MG/5ML suspension 30 mL  30 mL Oral Q4H PRN Clapacs, John T, MD      . LORazepam (ATIVAN) tablet 0.5 mg  0.5 mg Oral TID [name], MD      . magnesium hydroxide (MILK OF MAGNESIA) suspension 30 mL  30 mL Oral Daily PRN Clapacs, John T, MD      . nicotine (NICODERM CQ - dosed in mg/24 hr) patch 7 mg  7 mg Transdermal Daily [name], MD   7 mg at 01/17/21 4782    Lab Results:  No results found for this or any previous visit (from the past 48 hour(s)).  Blood Alcohol level:  Lab Results  Component Value Date   [facility] <10 01/08/2021   ETH <10 10/04/2020    Metabolic Disorder Labs: Lab Results  Component Value Date   HGBA1C 5.1 12/03/2019   MPG 99.67 12/03/2019   No results found for: PROLACTIN Lab Results  Component  Value Date   CHOL 171 10/04/2020   TRIG 61 10/04/2020   HDL 55 10/04/2020   CHOLHDL 3.1 10/04/2020   VLDL 12 10/04/2020   LDLCALC 104 (H) 10/04/2020    Physical Findings: AIMS:  , ,  ,  ,    CIWA:    COWS:     Musculoskeletal: Strength & Muscle Tone: within normal limits Gait & Station: normal Patient leans: N/A  Psychiatric Specialty Exam:  Presentation  General Appearance: Casual  Eye Contact:Good  Speech:Slow Speech Volume:Normal  Handedness:Right   Mood and Affect  Mood:Euthymic  Affect:Congruent   Thought Process  Thought Processes:Goal Directed  Descriptions of Associations:Intact  Orientation:Full (Time, Place and Person)  Thought Content:Logical  History of Schizophrenia/Schizoaffective  disorder:Yes  Duration of Psychotic Symptoms:Greater than six months  Hallucinations:Denies Ideas of Reference:None  Suicidal Thoughts:Denies Homicidal Thoughts:Denies  Sensorium  Memory:Immediate Fair; Recent Fair; Remote Fair  Judgment:Intact  Insight:Shallow   Executive Functions  Concentration:Fair  Attention Span:Fair  Recall:Fair  Fund of Knowledge:Fair  Language:Fair   Psychomotor Activity  Psychomotor Activity:Psychomotor Activity: Normal   Assets  Assets:Housing; Physical Health; Social Support   Sleep  Sleep:Sleep: Good Number of Hours of Sleep: 7.3    Physical Exam: Physical Exam  ROS  Blood pressure 103/78, pulse (!) 59, temperature 97.8 F (36.6 C), temperature source Oral, resp. rate 16, height 5\' 11"  (1.803 m), weight 73.5 kg, SpO2 98 %. Body mass index is 22.6 kg/m.   Treatment Plan Summary: Daily contact with patient to assess and evaluate symptoms and progress in treatment and Medication management  25 year old male with schizophrenia presenting with symptoms of psychosis and catatonia. Invega Sustenna 234 mg IM injection given 01/08/21. Remainder of loading dose 156 mg IM injection given 01/13/21. Decrease Ativan 0.5 mg TID for catatonia.    01/15/21, MD 01/17/2021, 2:27 PM

## 2021-01-17 NOTE — Progress Notes (Signed)
Patient alert and oriented x4, affect is flat he brightens upon approach,heappears less anxious, he was notedinteracting appropriately with peers and staff. Patient's thoughts are organized and coherent no bizarre behavior noted, he was visible in the milieu, no distress noted, he attended evening wrap up group, ate evening snack before going to bed. 15 minutes safety checks maintained will continue to monitor.

## 2021-01-17 NOTE — Progress Notes (Signed)
Pt is alert and oriented to person, place, time. Pt is calm, cooperative, affect is blunted, pt paces the hallways often, at times has bizarre behavior, stares at the walls or stands in front of doorways for extended periods of time. Pt appears to be internally preoccupied. Pt makes poor eye contact, does not interact with peers or staff, isolates to his room often resting quietly in bed. Pt is out for meals and is medication compliant. Pt denies suicidal and homicidal ideation, denies hallucinations, denies feelings of depression and anxiety. Will continue to monitor pt per Q15 minute face checks and monitor for safety and progress.

## 2021-01-17 NOTE — Progress Notes (Signed)
Recreation Therapy Notes   Date: 01/17/2021  Time: 10:00 am   Location: Craft room   Behavioral response: Appropriate  Intervention Topic: Relaxation   Discussion/Intervention:  Group content today was focused on relaxation. The group defined relaxation and identified healthy ways to relax. Individuals expressed how much time they spend relaxing. Patients expressed how much their life would be if they did not make time for themselves to relax. The group stated ways they could improve their relaxation techniques in the future.  Individuals participated in the intervention "Time to Relax" where they had a chance to experience different relaxation techniques.  Clinical Observations/Feedback: Patient came to group late at the last ten minutes. He sat quietly in group.  Eldon Zietlow LRT/CTRS         Shannell Mikkelsen 01/17/2021 12:12 PM

## 2021-01-18 DIAGNOSIS — F203 Undifferentiated schizophrenia: Secondary | ICD-10-CM | POA: Diagnosis not present

## 2021-01-18 NOTE — [facility] Group Notes (Signed)
[facility] Group Notes: (Clinical Social Work)   01/18/2021      Type of Therapy:  Group Therapy   Participation Level:  Did Not Attend despite MHT prompting   Cela Newcom N Seleta Hovland, LCSW  01/18/2021 2:13 PM   

## 2021-01-18 NOTE — Progress Notes (Addendum)
Patient alert and oriented x4, affect isflat he brightens upon approach,heappears less anxious, but noted responding to internal stimuli, he was notinteracting appropriately with peers and staff. Patient's thoughts are disorganized and incoherent, he was delusional and intrusive at the nursing station, with bizarre behavior. Patient was offered emotional support and encouragement, 15 minutes safety checks maintained will continue to monitor.

## 2021-01-18 NOTE — Progress Notes (Signed)
[facility] MD Progress Note  01/18/2021 10:52 AM Eric Pineda  MRN:  [id number]  Principal Problem: Schizophrenia, undifferentiated (HCC) Diagnosis: Principal Problem:   Schizophrenia, undifferentiated (HCC) Active Problems:   Catatonia  Eric Pineda is a 25 y.o. male patient with history of Schizophrenia, who presents to the [facility] unit due to bizarre and disorganized behavior at home.   Interval History Patient was seen today for re-evaluation.  Nursing reports no events overnight. The patient has no issues with performing ADLs.  Patient has been medication compliant. He continues to shuffle his feet at times.  He has not been interacting with peers and staff.  Subjective:  Patient requested to speak to the Dr this morning. He asked me about his discharge. He reports feeling "good" and does not report any complaints. He denies feeling depressed, anxious, suicidal, homicidal, having auditory or visual hallucinations. He denies side effects from his current medications.  He continues pacing hallways and active a little bizarre, although he expresses appropriate and safe behavior and his catatonia appears to be improving significantly.  Labs: no new results for review.   Total Time spent with patient: 15 minutes  Past Psychiatric History: see H&P   Past Medical History:  Past Medical History:  Diagnosis Date  . Bizarre behavior 12/02/2019   History reviewed. No pertinent surgical history. Family History: History reviewed. No pertinent family history. Family Psychiatric  History: see H&P  Social History:  Social History   Substance and Sexual Activity  Alcohol Use None     Social History   Substance and Sexual Activity  Drug Use Not on file    Social History   Socioeconomic History  . Marital status: Single    Spouse name: Not on file  . Number of children: Not on file  . Years of education: Not on file  . Highest education level: Not on file  Occupational History  . Not on file   Tobacco Use  . Smoking status: Light Tobacco Smoker  . Smokeless tobacco: Never Used  . Tobacco comment: Will not specify how much he smokes  Substance and Sexual Activity  . Alcohol use: Not on file  . Drug use: Not on file  . Sexual activity: Not on file  Other Topics Concern  . Not on file  Social History Narrative  . Not on file   Social Determinants of Health   Financial Resource Strain: Not on file  Food Insecurity: Not on file  Transportation Needs: Not on file  Physical Activity: Not on file  Stress: Not on file  Social Connections: Not on file   Additional Social History:                         Sleep: Fair  Appetite:  Fair  Current Medications: Current Facility-Administered Medications  Medication Dose Route Frequency Provider Last Rate Last Admin  . acetaminophen (TYLENOL) tablet 650 mg  650 mg Oral Q6H PRN Clapacs, Jackquline Denmark, MD   650 mg at 01/12/21 0900  . alum & mag hydroxide-simeth (MAALOX/MYLANTA) 200-200-20 MG/5ML suspension 30 mL  30 mL Oral Q4H PRN Clapacs, John T, MD      . LORazepam (ATIVAN) tablet 0.5 mg  0.5 mg Oral TID [name], MD   0.5 mg at 01/18/21 0751  . magnesium hydroxide (MILK OF MAGNESIA) suspension 30 mL  30 mL Oral Daily PRN Clapacs, Jackquline Denmark, MD      . nicotine (NICODERM CQ - dosed  in mg/24 hr) patch 7 mg  7 mg Transdermal Daily [name], MD   7 mg at 01/17/21 2229    Lab Results: No results found for this or any previous visit (from the past 48 hour(s)).  Blood Alcohol level:  Lab Results  Component Value Date   ETH <10 01/08/2021   ETH <10 10/04/2020    Metabolic Disorder Labs: Lab Results  Component Value Date   HGBA1C 5.1 12/03/2019   MPG 99.67 12/03/2019   No results found for: PROLACTIN Lab Results  Component Value Date   CHOL 171 10/04/2020   TRIG 61 10/04/2020   HDL 55 10/04/2020   CHOLHDL 3.1 10/04/2020   VLDL 12 10/04/2020   LDLCALC 104 (H) 10/04/2020    Physical Findings: AIMS:  ,  ,  ,  ,    CIWA:    COWS:     Musculoskeletal: Strength & Muscle Tone: within normal limits Gait & Station: normal Patient leans: N/A  Psychiatric Specialty Exam:  Presentation  General Appearance: Casual  Eye Contact:Good  Speech:Normal Rate  Speech Volume:Normal  Handedness:Right   Mood and Affect  Mood:Euthymic  Affect:Congruent   Thought Process  Thought Processes:Goal Directed  Descriptions of Associations:Intact  Orientation:Full (Time, Place and Person)  Thought Content:Logical  History of Schizophrenia/Schizoaffective disorder:Yes  Duration of Psychotic Symptoms:Greater than six months  Hallucinations:No data recorded Ideas of Reference:None  Suicidal Thoughts:No data recorded Homicidal Thoughts:No data recorded  Sensorium  Memory:Immediate Fair; Recent Fair; Remote Fair  Judgment:Intact  Insight:Shallow   Executive Functions  Concentration:Fair  Attention Span:Fair  Recall:Fair  Fund of Knowledge:Fair  Language:Fair   Psychomotor Activity  Psychomotor Activity:No data recorded  Assets  Assets:Housing; Physical Health; Social Support   Sleep  Sleep:No data recorded   Physical Exam: Physical Exam ROS Blood pressure 103/78, pulse (!) 59, temperature 97.8 F (36.6 C), temperature source Oral, resp. rate 16, height 5\' 11"  (1.803 m), weight 73.5 kg, SpO2 98 %. Body mass index is 22.6 kg/m.   Treatment Plan Summary: Daily contact with patient to assess and evaluate symptoms and progress in treatment and Medication management   25 year old male with schizophrenia presenting with symptoms of psychosis and catatonia. Chart reviewed. Patient discussed with nursing. Invega Sustenna 234 mg IM injection given 01/08/21. Remainder of loading dose 156 mg IM injection given 01/13/21. Yesterday Ativan decreased to 0.5 mg TID for catatonia. Patient`s status continues to improve. No medication changes today.  Plan: -continue inpatient  psych admission; 15-minute checks; daily contact with patient to assess and evaluate symptoms and progress in treatment; psychoeducation.  -continue scheduled medications: . LORazepam  0.5 mg Oral TID  . nicotine  7 mg Transdermal Daily    -continue PRN medications.  acetaminophen, alum & mag hydroxide-simeth, magnesium hydroxide  -Pertinent Labs: no new labs ordered today   -Disposition: Estimated duration of hospitalization: early next week. All necessary aftercare will be arranged prior to discharge Likely d/c home with outpatient psych follow-up.  -  I certify that the patient does need, on a daily basis, active treatment furnished directly by or requiring the supervision of inpatient psychiatric facility personnel.   01/15/21, MD 01/18/2021, 10:52 AM

## 2021-01-18 NOTE — Progress Notes (Signed)
Patient is alert and oriented x 4. He is does not appear to be anxious.  He is fixed on changes his clothes and asking for new clothes throughout the morning shift. His thoughts are disorganized and incoherent.  He is delusional with strange behavior.  Patient was offered emotional support and encouragement. He continues to shuffle his feet at times.  He has not been interacting with peers and staff, 15 minutes safety check maintained and will continue to monitor patient.

## 2021-01-19 MED ORDER — LORAZEPAM 0.5 MG PO TABS
0.5000 mg | ORAL_TABLET | Freq: Two times a day (BID) | ORAL | Status: DC
  Administered 2021-01-19 – 2021-01-20 (×3): 0.5 mg via ORAL
  Filled 2021-01-19 (×3): qty 1

## 2021-01-19 NOTE — Plan of Care (Signed)
  Problem: Education: Goal: Knowledge of [facility] Education information/materials will improve Outcome: Progressing Goal: Emotional status will improve Outcome: Progressing Goal: Mental status will improve Outcome: Progressing Goal: Verbalization of understanding the information provided will improve Outcome: Progressing   Problem: Safety: Goal: Periods of time without injury will increase Outcome: Progressing   Problem: Activity: Goal: Will verbalize the importance of balancing activity with adequate rest periods Outcome: Progressing   Problem: Safety: Goal: Ability to redirect hostility and anger into socially appropriate behaviors will improve Outcome: Progressing Goal: Ability to remain free from injury will improve Outcome: Progressing   

## 2021-01-19 NOTE — Progress Notes (Signed)
D: Pt alert and oriented. Pt denies experiencing any anxiety/depression at this time. Pt denies experiencing any pain. Pt denies experiencing any SI/HI, or AVH at this time.   Pt is cooperative with taking medication and vital signs. Pt is observed outside of room throughout the day with a shuffled gait.  A: Scheduled medications administered to pt, per MD orders. Support and encouragement provided. Frequent verbal contact made. Routine safety checks conducted q15 minutes.   R: No adverse drug reactions noted. Pt verbally contracts for safety at this time. Pt complaint with medications and treatment plan. Pt interacts well with others on the unit. Pt remains safe at this time. Will continue to monitor.

## 2021-01-19 NOTE — Progress Notes (Signed)
Patient has been isolative to room. Denies SI, HI, and AVH. Has dirty linen lying all over the room and is asking for more. He says he pees on himself when it's cold.

## 2021-01-19 NOTE — Progress Notes (Signed)
[facility]: [name] MD Progress Note  01/19/2021 11:24 AM Eric Pineda  MRN:  [id number]  Principal Problem: Schizophrenia, undifferentiated (HCC) Diagnosis: Principal Problem:   Schizophrenia, undifferentiated (HCC) Active Problems:   Catatonia  [name] is a 25 y.o. male patient with history of Schizophrenia, who presents to the [facility] unit due to bizarre and disorganized behavior at home.   Interval History Patient was seen today for re-evaluation.  Nursing reports no events overnight. The patient has no issues with performing ADLs.  Patient has been medication compliant. He continues to shuffle his feet at times.  He has not been interacting with peers and staff.  Subjective:  Patient reports feeling "good" and denies having any mental or physical complaints. He denies feeling depressed, anxious, suicidal, homicidal, having auditory or visual hallucinations. He denies side effects from his current medications.  He continues pacing hallways and acts a little bizarre, although he expresses appropriate and safe behavior and his catatonia appears to be improving significantly.  Labs: no new results for review.   Total Time spent with patient: 15 minutes  Past Psychiatric History: see H&P   Past Medical History:  Past Medical History:  Diagnosis Date  . Bizarre behavior 12/02/2019   History reviewed. No pertinent surgical history. Family History: History reviewed. No pertinent family history. Family Psychiatric  History: see H&P  Social History:  Social History   Substance and Sexual Activity  Alcohol Use None     Social History   Substance and Sexual Activity  Drug Use Not on file    Social History   Socioeconomic History  . Marital status: Single    Spouse name: Not on file  . Number of children: Not on file  . Years of education: Not on file  . Highest education level: Not on file  Occupational History  . Not on file  Tobacco Use  . Smoking status: Light Tobacco Smoker  .  Smokeless tobacco: Never Used  . Tobacco comment: Will not specify how much he smokes  Substance and Sexual Activity  . Alcohol use: Not on file  . Drug use: Not on file  . Sexual activity: Not on file  Other Topics Concern  . Not on file  Social History Narrative  . Not on file   Social Determinants of Health   Financial Resource Strain: Not on file  Food Insecurity: Not on file  Transportation Needs: Not on file  Physical Activity: Not on file  Stress: Not on file  Social Connections: Not on file   Additional Social History:                         Sleep: Fair  Appetite:  Fair  Current Medications: Current Facility-Administered Medications  Medication Dose Route Frequency Provider Last Rate Last Admin  . acetaminophen (TYLENOL) tablet 650 mg  650 mg Oral Q6H PRN Clapacs, Jackquline Denmark, MD   650 mg at 01/12/21 0900  . alum & mag hydroxide-simeth (MAALOX/MYLANTA) 200-200-20 MG/5ML suspension 30 mL  30 mL Oral Q4H PRN Clapacs, John T, MD      . LORazepam (ATIVAN) tablet 0.5 mg  0.5 mg Oral TID [name], MD   0.5 mg at 01/19/21 0858  . magnesium hydroxide (MILK OF MAGNESIA) suspension 30 mL  30 mL Oral Daily PRN Clapacs, John T, MD      . nicotine (NICODERM CQ - dosed in mg/24 hr) patch 7 mg  7 mg Transdermal Daily Les Pou  M, MD   7 mg at 01/19/21 2542    Lab Results: No results found for this or any previous visit (from the past 48 hour(s)).  Blood Alcohol level:  Lab Results  Component Value Date   ETH <10 01/08/2021   ETH <10 10/04/2020    Metabolic Disorder Labs: Lab Results  Component Value Date   HGBA1C 5.1 12/03/2019   MPG 99.67 12/03/2019   No results found for: PROLACTIN Lab Results  Component Value Date   CHOL 171 10/04/2020   TRIG 61 10/04/2020   HDL 55 10/04/2020   CHOLHDL 3.1 10/04/2020   VLDL 12 10/04/2020   LDLCALC 104 (H) 10/04/2020    Physical Findings: AIMS:  , ,  ,  ,    CIWA:    COWS:      Musculoskeletal: Strength & Muscle Tone: within normal limits Gait & Station: normal Patient leans: N/A  Psychiatric Specialty Exam:  Presentation  General Appearance: Casual  Eye Contact:Good  Speech:Normal Rate  Speech Volume:Normal  Handedness:Right   Mood and Affect  Mood:Euthymic  Affect:Congruent   Thought Process  Thought Processes:Goal Directed  Descriptions of Associations:Intact  Orientation:Full (Time, Place and Person)  Thought Content:Logical  History of Schizophrenia/Schizoaffective disorder:Yes  Duration of Psychotic Symptoms:Greater than six months  Hallucinations:No data recorded Ideas of Reference:None  Suicidal Thoughts:No data recorded Homicidal Thoughts:No data recorded  Sensorium  Memory:Immediate Fair; Recent Fair; Remote Fair  Judgment:Intact  Insight:Shallow   Executive Functions  Concentration:Fair  Attention Span:Fair  Recall:Fair  Fund of Knowledge:Fair  Language:Fair   Psychomotor Activity  Psychomotor Activity:No data recorded  Assets  Assets:Housing; Physical Health; Social Support   Sleep  Sleep:No data recorded   Physical Exam: Physical Exam  ROS  Blood pressure (!) 144/92, pulse 83, temperature 98.5 F (36.9 C), temperature source Oral, resp. rate 18, height 5\' 11"  (1.803 m), weight 73.5 kg, SpO2 99 %. Body mass index is 22.6 kg/m.   Treatment Plan Summary: Daily contact with patient to assess and evaluate symptoms and progress in treatment and Medication management   25 year old male with schizophrenia presenting with symptoms of psychosis and catatonia. Chart reviewed. Patient discussed with nursing. Invega Sustenna 234 mg IM injection given 01/08/21. Remainder of loading dose 156 mg IM injection given 01/13/21. Ativan decreased on 4/29 to 0.5 mg TID for catatonia. Patient`s status continues to improve. Will decrease Ativan today to 0.5 mg BID.  Plan: -continue inpatient psych admission;  15-minute checks; daily contact with patient to assess and evaluate symptoms and progress in treatment; psychoeducation.  -continue scheduled medications: . LORazepam  0.5 mg Oral BID  . nicotine  7 mg Transdermal Daily    -continue PRN medications.  acetaminophen, alum & mag hydroxide-simeth, magnesium hydroxide  -Pertinent Labs: no new labs ordered today   -Disposition: Estimated duration of hospitalization: early next week. All necessary aftercare will be arranged prior to discharge Likely d/c home with outpatient psych follow-up.  -  I certify that the patient does need, on a daily basis, active treatment furnished directly by or requiring the supervision of inpatient psychiatric facility personnel.   5/29, MD 01/19/2021, 11:24 AM

## 2021-01-20 DIAGNOSIS — F203 Undifferentiated schizophrenia: Secondary | ICD-10-CM | POA: Diagnosis not present

## 2021-01-20 MED ORDER — LORAZEPAM 0.5 MG PO TABS: 0.5000 mg | ORAL_TABLET | Freq: Two times a day (BID) | ORAL | 1 refills | Status: DC

## 2021-01-20 MED ORDER — PALIPERIDONE PALMITATE ER 156 MG/ML IM SUSY: 156.0000 mg | PREFILLED_SYRINGE | INTRAMUSCULAR | 1 refills | Status: DC

## 2021-01-20 MED ORDER — PALIPERIDONE PALMITATE ER 156 MG/ML IM SUSY: 156.0000 mg | PREFILLED_SYRINGE | INTRAMUSCULAR | Status: DC

## 2021-01-20 NOTE — Discharge Summary (Signed)
Physician Discharge Summary Note  Patient:  Eric Pineda is an 25 y.o., male MRN:  [id number] DOB:  October 16, 1995 Patient phone:  [phone] (home)  Patient address:   [address] [place] [address],  Total Time spent with patient: 45 minutes  Date of Admission:  01/09/2021 Date of Discharge: Jan 20, 2021  Reason for Admission: Presented to the emergency room under IVC with psychotic disorganized behavior at home and noncompliance with medicine  Principal Problem: Schizophrenia, undifferentiated (HCC) Discharge Diagnoses: Principal Problem:   Schizophrenia, undifferentiated (HCC) Active Problems:   Catatonia   Past Psychiatric History: History of schizophrenia out several prior hospitalizations and poor outpatient compliance  Past Medical History:  Past Medical History:  Diagnosis Date  . Bizarre behavior 12/02/2019   History reviewed. No pertinent surgical history. Family History: History reviewed. No pertinent family history. Family Psychiatric  History: None reported Social History:  Social History   Substance and Sexual Activity  Alcohol Use None     Social History   Substance and Sexual Activity  Drug Use Not on file    Social History   Socioeconomic History  . Marital status: Single    Spouse name: Not on file  . Number of children: Not on file  . Years of education: Not on file  . Highest education level: Not on file  Occupational History  . Not on file  Tobacco Use  . Smoking status: Light Tobacco Smoker  . Smokeless tobacco: Never Used  . Tobacco comment: Will not specify how much he smokes  Substance and Sexual Activity  . Alcohol use: Not on file  . Drug use: Not on file  . Sexual activity: Not on file  Other Topics Concern  . Not on file  Social History Narrative  . Not on file   Social Determinants of Health   Financial Resource Strain: Not on file  Food Insecurity: Not on file  Transportation Needs: Not on file  Physical  Activity: Not on file  Stress: Not on file  Social Connections: Not on file    Hospital Course: Catatonia resolved.  Slow improvement in mental state.  Ultimately started on long-acting medication shots.  Currently on long-acting Invega and low-dose Ativan.  Not expressing any hallucinations.  Able to interact with others more appropriately.  Still blunted at times in his affect and does not talk much but is much more lucid than he was previously.  No sign of acute dangerousness.  Patient has been given much education about the importance of staying on medicine and follow-up appointments through local mental health agencies.  At this time no longer meets commitment criteria nor requires further acute inpatient treatment and will be discharged home.  Physical Findings: AIMS:  , ,  ,  ,    CIWA:    COWS:     Musculoskeletal: Strength & Muscle Tone: within normal limits Gait & Station: normal Patient leans: N/A                Psychiatric Specialty Exam:  Presentation  General Appearance: Casual  Eye Contact:Good  Speech:Normal Rate  Speech Volume:Normal  Handedness:Right   Mood and Affect  Mood:Euthymic  Affect:Congruent   Thought Process  Thought Processes:Goal Directed  Descriptions of Associations:Intact  Orientation:Full (Time, Place and Person)  Thought Content:Logical  History of Schizophrenia/Schizoaffective disorder:Yes  Duration of Psychotic Symptoms:Greater than six months  Hallucinations:No data recorded Ideas of Reference:None  Suicidal Thoughts:No data recorded Homicidal Thoughts:No data recorded  Sensorium  Memory:Immediate Fair; Recent Fair; Remote Fair  Judgment:Intact  Insight:Shallow   Executive Functions  Concentration:Fair  Attention Span:Fair  Recall:Fair  Fund of Knowledge:Fair  Language:Fair   Psychomotor Activity  Psychomotor Activity:No data recorded  Assets  Assets:Housing; Physical Health; Social  Support   Sleep  Sleep:No data recorded   Physical Exam: Physical Exam Vitals and nursing note reviewed.  Constitutional:      Appearance: Normal appearance.  HENT:     Head: Normocephalic and atraumatic.     Mouth/Throat:     Pharynx: Oropharynx is clear.  Eyes:     Pupils: Pupils are equal, round, and reactive to light.  Cardiovascular:     Rate and Rhythm: Normal rate and regular rhythm.  Pulmonary:     Effort: Pulmonary effort is normal.     Breath sounds: Normal breath sounds.  Abdominal:     General: Abdomen is flat.     Palpations: Abdomen is soft.  Musculoskeletal:        General: Normal range of motion.  Skin:    General: Skin is warm and dry.  Neurological:     General: No focal deficit present.     Mental Status: He is alert. Mental status is at baseline.  Psychiatric:        Mood and Affect: Mood normal.        Thought Content: Thought content normal.    Review of Systems  Constitutional: Negative.   HENT: Negative.   Eyes: Negative.   Respiratory: Negative.   Cardiovascular: Negative.   Gastrointestinal: Negative.   Musculoskeletal: Negative.   Skin: Negative.   Neurological: Negative.   Psychiatric/Behavioral: Negative.    Blood pressure (!) 144/92, pulse 83, temperature 98.5 F (36.9 C), temperature source Oral, resp. rate 18, height 5\' 11"  (1.803 m), weight 73.5 kg, SpO2 99 %. Body mass index is 22.6 kg/m.   Have you used any form of tobacco in the last 30 days? (Cigarettes, Smokeless Tobacco, Cigars, and/or Pipes): No  Has this patient used any form of tobacco in the last 30 days? (Cigarettes, Smokeless Tobacco, Cigars, and/or Pipes) Yes, Yes, A prescription for an FDA-approved tobacco cessation medication was offered at discharge and the patient refused  Blood Alcohol level:  Lab Results  Component Value Date   [facility] <10 01/08/2021   ETH <10 10/04/2020    Metabolic Disorder Labs:  Lab Results  Component Value Date   HGBA1C 5.1  12/03/2019   MPG 99.67 12/03/2019   No results found for: PROLACTIN Lab Results  Component Value Date   CHOL 171 10/04/2020   TRIG 61 10/04/2020   HDL 55 10/04/2020   CHOLHDL 3.1 10/04/2020   VLDL 12 10/04/2020   LDLCALC 104 (H) 10/04/2020    See Psychiatric Specialty Exam and Suicide Risk Assessment completed by Attending Physician prior to discharge.  Discharge destination:  Home  Is patient on multiple antipsychotic therapies at discharge:  No   Has Patient had three or more failed trials of antipsychotic monotherapy by history:  No  Recommended Plan for Multiple Antipsychotic Therapies: NA  Discharge Instructions    Diet - low sodium heart healthy   Complete by: As directed    Increase activity slowly   Complete by: As directed      Allergies as of 01/20/2021   No Known Allergies     Medication List    STOP taking these medications   hydrOXYzine 25 MG tablet Commonly known as: ATARAX/VISTARIL   traZODone 50 MG  tablet Commonly known as: DESYREL     TAKE these medications     Indication  LORazepam 0.5 MG tablet Commonly known as: ATIVAN Take 1 tablet (0.5 mg total) by mouth 2 (two) times daily. What changed:   medication strength  how much to take  Indication: Feeling Anxious   paliperidone 156 MG/ML Susy injection Commonly known as: INVEGA SUSTENNA Inject 1 mL (156 mg total) into the muscle every 28 (twenty-eight) days. Start taking on: Feb 12, 2021 What changed:   medication strength  how much to take  when to take this  Indication: Schizophrenia       Follow-up Information    [facility] Follow up on 02/04/2021.   Why: You have appointments scheduled for primary care/med managment and behavioral health follow up Tuesday May 17th. First appt is at 10:40am and second appt. is at 11:20am. You can request to see both providers at the same time. Thanks! Contact information: [address] [place]  [address] [phone]               Follow-up recommendations:  Activity:  Activity as tolerated Diet:  Regular diet Other:  Follow-up outpatient mental health treatment as noted in the order.  [facility].  Continue on outpatient injections.  Comments: Prescription provided at discharge  Signed: Mordecai Rasmussen, MD 01/20/2021, 10:41 AM

## 2021-01-20 NOTE — Progress Notes (Signed)
  [facility] Adult Case Management Discharge Plan :  Will you be returning to the same living situation after discharge:  Yes,  pt's home At discharge, do you have transportation home?: Yes,  pt's parents Do you have the ability to pay for your medications: No.  Release of information consent forms completed and in the chart;  Patient's signature needed at discharge.  Patient to Follow up at:  Follow-up [employer], [facility] Follow up on 02/04/2021.   Why: You have appointments scheduled for primary care/med managment and behavioral health follow up Tuesday May 17th. First appt is at 10:40am and second appt. is at 11:20am. You can request to see both providers at the same time. Thanks! Contact information: [address] [place] [address] [phone]               Next level of care provider has access to [facility] Link:no  Safety Planning and Suicide Prevention discussed: Yes,  completed with pt  Have you used any form of tobacco in the last 30 days? (Cigarettes, Smokeless Tobacco, Cigars, and/or Pipes): No  Has patient been referred to the Quitline?: Patient refused referral  Patient has been referred for addiction treatment: Pt. refused referral  Lamonda Noxon A [place], LCSWA 01/20/2021, 9:37 AM

## 2021-01-20 NOTE — Progress Notes (Signed)
Recreation Therapy Notes  Date: 01/20/2021  Time: 10:00 am   Location: Craft room     Behavioral response: N/A   Intervention Topic: Self- esteem    Discussion/Intervention: Patient did not attend group.   Clinical Observations/Feedback:  Patient did not attend group.   Jayjay Littles LRT/CTRS        Skie Vitrano 01/20/2021 11:52 AM

## 2021-01-20 NOTE — Progress Notes (Signed)
D: Pt alert and oriented. Pt denies experiencing any anxiety/depression at this time. Pt denies experiencing any pain at this time. Pt denies experiencing any SI/HI, or AVH at this time.   A: Scheduled medications administered to pt, per MD orders. Support and encouragement provided. Frequent verbal contact made. Routine safety checks conducted q15 minutes.   R: No adverse drug reactions noted. Pt verbally contracts for safety at this time. Pt complaint with medications and treatment plan. Pt interacts well with others on the unit. Pt remains safe at this time. Will continue to monitor.  

## 2021-01-20 NOTE — Plan of Care (Signed)
  Problem: Group Participation Goal: STG - Patient will engage in groups without prompting or encouragement from LRT x3 group sessions within 5 recreation therapy group sessions Description: STG - Patient will engage in groups without prompting or encouragement from LRT x3 group sessions within 5 recreation therapy group sessions 01/20/2021 1331 by Ernest Haber, LRT Outcome: Not Applicable 09/27/107 3235 by Ernest Haber, LRT Outcome: Not Met (add Reason) Note: Patient spent most of his time in his room and walking the halls.  01/20/2021 1330 by Ernest Haber, LRT Outcome: Not Met (add Reason)

## 2021-01-20 NOTE — Tx Team (Signed)
Interdisciplinary Treatment and Diagnostic Plan Update  01/20/2021 Time of Session: 9:00AM Eric Pineda MRN: [id number]  Principal Diagnosis: Schizophrenia, undifferentiated (HCC)  Secondary Diagnoses: Principal Problem:   Schizophrenia, undifferentiated (HCC) Active Problems:   Catatonia   Current Medications:  Current Facility-Administered Medications  Medication Dose Route Frequency Provider Last Rate Last Admin  . acetaminophen (TYLENOL) tablet 650 mg  650 mg Oral Q6H PRN Clapacs, John T, MD   650 mg at 01/12/21 0900  . alum & mag hydroxide-simeth (MAALOX/MYLANTA) 200-200-20 MG/5ML suspension 30 mL  30 mL Oral Q4H PRN Clapacs, John T, MD      . LORazepam (ATIVAN) tablet 0.5 mg  0.5 mg Oral BID Paliy, Alisa, MD   0.5 mg at 01/20/21 0814  . magnesium hydroxide (MILK OF MAGNESIA) suspension 30 mL  30 mL Oral Daily PRN Clapacs, John T, MD      . nicotine (NICODERM CQ - dosed in mg/24 hr) patch 7 mg  7 mg Transdermal Daily Freeman, Megan M, MD   7 mg at 01/19/21 0859   PTA Medications: Medications Prior to Admission  Medication Sig Dispense Refill Last Dose  . hydrOXYzine (ATARAX/VISTARIL) 25 MG tablet Take 1 tablet (25 mg total) by mouth 3 (three) times daily as needed for anxiety. 90 tablet 1   . LORazepam (ATIVAN) 1 MG tablet Take 1 tablet (1 mg total) by mouth 2 (two) times daily. 14 tablet 0   . paliperidone (INVEGA SUSTENNA) 234 MG/1.5ML SUSY injection Inject 234 mg into the muscle every 30 (thirty) days. 1.5 mL 3   . traZODone (DESYREL) 50 MG tablet Take 1 tablet (50 mg total) by mouth at bedtime as needed for sleep. 30 tablet 1     Patient Stressors: Marital or family conflict Medication change or noncompliance  Patient Strengths: Ability for insight Supportive family/friends  Treatment Modalities: Medication Management, Group therapy, Case management,  1 to 1 session with clinician, Psychoeducation, Recreational therapy.   Physician Treatment Plan for Primary  Diagnosis: Schizophrenia, undifferentiated (HCC) Long Term Goal(s): Improvement in symptoms so as ready for discharge Improvement in symptoms so as ready for discharge   Short Term Goals: Ability to identify changes in lifestyle to reduce recurrence of condition will improve Ability to verbalize feelings will improve Ability to disclose and discuss suicidal ideas Ability to demonstrate self-control will improve Ability to identify and develop effective coping behaviors will improve Ability to maintain clinical measurements within normal limits will improve Compliance with prescribed medications will improve Ability to identify changes in lifestyle to reduce recurrence of condition will improve Ability to verbalize feelings will improve Ability to disclose and discuss suicidal ideas Ability to demonstrate self-control will improve Ability to identify and develop effective coping behaviors will improve Ability to maintain clinical measurements within normal limits will improve Compliance with prescribed medications will improve  Medication Management: Evaluate patient's response, side effects, and tolerance of medication regimen.  Therapeutic Interventions: 1 to 1 sessions, Unit Group sessions and Medication administration.  Evaluation of Outcomes: Met  Physician Treatment Plan for Secondary Diagnosis: Principal Problem:   Schizophrenia, undifferentiated (HCC) Active Problems:   Catatonia  Long Term Goal(s): Improvement in symptoms so as ready for discharge Improvement in symptoms so as ready for discharge   Short Term Goals: Ability to identify changes in lifestyle to reduce recurrence of condition will improve Ability to verbalize feelings will improve Ability to disclose and discuss suicidal ideas Ability to demonstrate self-control will improve Ability to identify and develop effective coping behaviors   will improve Ability to maintain clinical measurements within normal limits  will improve Compliance with prescribed medications will improve Ability to identify changes in lifestyle to reduce recurrence of condition will improve Ability to verbalize feelings will improve Ability to disclose and discuss suicidal ideas Ability to demonstrate self-control will improve Ability to identify and develop effective coping behaviors will improve Ability to maintain clinical measurements within normal limits will improve Compliance with prescribed medications will improve     Medication Management: Evaluate patient's response, side effects, and tolerance of medication regimen.  Therapeutic Interventions: 1 to 1 sessions, Unit Group sessions and Medication administration.  Evaluation of Outcomes: Met   RN Treatment Plan for Primary Diagnosis: Schizophrenia, undifferentiated ([place]) Long Term Goal(s): Knowledge of disease and therapeutic regimen to maintain health will improve  Short Term Goals: Ability to remain free from injury will improve, Ability to demonstrate self-control, Ability to participate in decision making will improve, Ability to verbalize feelings will improve, Ability to identify and develop effective coping behaviors will improve and Compliance with prescribed medications will improve  Medication Management: RN will administer medications as ordered by provider, will assess and evaluate patient's response and provide education to patient for prescribed medication. RN will report any adverse and/or side effects to prescribing provider.  Therapeutic Interventions: 1 on 1 counseling sessions, Psychoeducation, Medication administration, Evaluate responses to treatment, Monitor vital signs and CBGs as ordered, Perform/monitor CIWA, COWS, AIMS and Fall Risk screenings as ordered, Perform wound care treatments as ordered.  Evaluation of Outcomes: Met   LCSW Treatment Plan for Primary Diagnosis: Schizophrenia, undifferentiated ([place]) Long Term Goal(s): Safe  transition to appropriate next level of care at discharge, Engage patient in therapeutic group addressing interpersonal concerns.  Short Term Goals: Engage patient in aftercare planning with referrals and resources, Increase social support, Increase ability to appropriately verbalize feelings, Increase emotional regulation, Identify triggers associated with mental health/substance abuse issues and Increase skills for wellness and recovery  Therapeutic Interventions: Assess for all discharge needs, 1 to 1 time with Social worker, Explore available resources and support systems, Assess for adequacy in community support network, Educate family and significant other(s) on suicide prevention, Complete Psychosocial Assessment, Interpersonal group therapy.  Evaluation of Outcomes: Met   Progress in Treatment: Attending groups: Yes. Participating in groups: Yes. Taking medication as prescribed: Yes. Toleration medication: Yes. Family/Significant other contact made: No, will contact:  when given permission. Patient understands diagnosis: No. Discussing patient identified problems/goals with staff: No. Medical problems stabilized or resolved: Yes. Denies suicidal/homicidal ideation: Yes. Issues/concerns per patient self-inventory: No. Other: None.  New problem(s) identified: No, Describe:  none. Update 01/15/21: no changes reported. Update 01/20/21:  No change   New Short Term/Long Term Goal(s): elimination of symptoms of psychosis, medication management for mood stabilization; development of comprehensive mental wellness/sobriety plan. Update 01/15/21: no change Update 01/20/21:  No change  Patient Goals: Pt declined to meet with treatment team.  Update 01/15/21: no changes reported. Update 01/20/21:  N  Discharge Plan or Barriers: CSW will assist with development of discharge/aftercare plan.Update 01/15/21: no change Update 01/20/21:  Patient is ready for discharge.  Reason for Continuation of  Hospitalization: Hallucinations Medication stabilization  Estimated Length of Stay: 1-7 days  Attendees: Patient: Eric Pineda 01/20/2021 10:38 AM  Physician: Selina Cooley, MD 01/20/2021 10:38 AM  Nursing: Tyler Pita, RN  01/20/2021 10:38 AM  RN Care Manager: 01/20/2021 10:38 AM  Social Worker: Paulla Dolly, MSW, [place], [place]  01/20/2021 10:38 AM  Recreational Therapist: Isaias Sakai  Marcello Fennel, LRT  01/20/2021 10:38 AM  Other: Kiva [place], MSW, LCSW-A 01/20/2021 10:38 AM  Other: Michell Heinrich, MSW, LCSW, LCAS  01/20/2021 10:38 AM  Other: 01/20/2021 10:38 AM    Scribe for Treatment Team: Durenda Hurt, [place] 01/20/2021 10:38 AM

## 2021-01-20 NOTE — BHH Group Notes (Signed)
LCSW Group Therapy Note   01/20/2021 2:31 PM  Type of Therapy and Topic:  Group Therapy:  Overcoming Obstacles   Participation Level:  Minimal   Description of Group:    In this group patients will be encouraged to explore what they see as obstacles to their own wellness and recovery. They will be guided to discuss their thoughts, feelings, and behaviors related to these obstacles. The group will process together ways to cope with barriers, with attention given to specific choices patients can make. Each patient will be challenged to identify changes they are motivated to make in order to overcome their obstacles. This group will be process-oriented, with patients participating in exploration of their own experiences as well as giving and receiving support and challenge from other group members.   Therapeutic Goals: 1. Patient will identify personal and current obstacles as they relate to admission. 2. Patient will identify barriers that currently interfere with their wellness or overcoming obstacles.  3. Patient will identify feelings, thought process and behaviors related to these barriers. 4. Patient will identify two changes they are willing to make to overcome these obstacles:      Summary of Patient Progress Patient presented to group shortly before closing. Patient shared during discussion, albeit tangential with loose associations.      Therapeutic Modalities:   Cognitive Behavioral Therapy Solution Focused Therapy Motivational Interviewing Relapse Prevention Therapy  Gwenevere Ghazi, MSW, [place], Bridget Hartshorn 01/20/2021 2:31 PM

## 2021-01-20 NOTE — Progress Notes (Signed)
D: Pt alert and oriented. Pt denies experiencing any pain, SI/HI, or AVH at this time. Pt reports he will be able to keep himself safe when he returns home.   A: Pt and caregiver received discharge and medication education/information. Pt belongings were returned and signed for at this time.   R: Pt and caregiver verbalized understanding of discharge and medication education/information.  Pt escorted to the medical mall front lobby where mother picked pt up.

## 2021-01-20 NOTE — Progress Notes (Signed)
Recreation Therapy Notes  INPATIENT RECREATION TR PLAN  Patient Details Name: Eric Pineda MRN: [id number] DOB: 1996-07-30 Today's Date: 01/20/2021  Rec Therapy Plan Is patient appropriate for Therapeutic Recreation?: Yes Treatment times per week: at least 3 Estimated Length of Stay: 5-7 days TR Treatment/Interventions: Group participation (Comment)  Discharge Criteria Pt will be discharged from therapy if:: Discharged Treatment plan/goals/alternatives discussed and agreed upon by:: Patient/family  Discharge Summary Short term goals set: Patient will engage in groups without prompting or encouragement from LRT x3 group sessions within 5 recreation therapy group sessions Short term goals met: Not met Progress toward goals comments: Groups attended Which groups?: Other (Comment) (Relaxation) Reason goals not met: N/A Therapeutic equipment acquired: N/A Reason patient discharged from therapy: Discharge from hospital Pt/family agrees with progress & goals achieved: Yes Date patient discharged from therapy: 01/20/21   Eric Pineda 01/20/2021, 1:32 PM

## 2021-01-20 NOTE — Plan of Care (Signed)
  Problem: Education: Goal: Knowledge of [facility] Education information/materials will improve Outcome: Progressing Goal: Emotional status will improve Outcome: Progressing Goal: Mental status will improve Outcome: Progressing Goal: Verbalization of understanding the information provided will improve Outcome: Progressing   Problem: Safety: Goal: Periods of time without injury will increase Outcome: Progressing   Problem: Activity: Goal: Will verbalize the importance of balancing activity with adequate rest periods Outcome: Progressing   Problem: Safety: Goal: Ability to redirect hostility and anger into socially appropriate behaviors will improve Outcome: Progressing Goal: Ability to remain free from injury will improve Outcome: Progressing   

## 2021-01-20 NOTE — BHH Suicide Risk Assessment (Signed)
[facility] Discharge Suicide Risk Assessment   Principal Problem: Schizophrenia, undifferentiated (HCC) Discharge Diagnoses: Principal Problem:   Schizophrenia, undifferentiated (HCC) Active Problems:   Catatonia   Total Time spent with patient: 45 minutes  Musculoskeletal: Strength & Muscle Tone: within normal limits Gait & Station: normal Patient leans: N/A  Psychiatric Specialty Exam: Review of Systems  Constitutional: Negative.   HENT: Negative.   Eyes: Negative.   Respiratory: Negative.   Cardiovascular: Negative.   Gastrointestinal: Negative.   Musculoskeletal: Negative.   Skin: Negative.   Neurological: Negative.   Psychiatric/Behavioral: Negative.     Blood pressure (!) 144/92, pulse 83, temperature 98.5 F (36.9 C), temperature source Oral, resp. rate 18, height 5\' 11"  (1.803 m), weight 73.5 kg, SpO2 99 %.Body mass index is 22.6 kg/m.  General Appearance: Casual  Eye Contact::  Good  Speech:  Clear and Coherent409  Volume:  Decreased  Mood:  Euthymic  Affect:  Constricted  Thought Process:  Coherent  Orientation:  Full (Time, Place, and Person)  Thought Content:  Logical  Suicidal Thoughts:  No  Homicidal Thoughts:  No  Memory:  Immediate;   Fair Recent;   Fair Remote;   Fair  Judgement:  Fair  Insight:  Fair  Psychomotor Activity:  Normal  Concentration:  Fair  Recall:  [phone] of Knowledge:Fair  Language: Fair  Akathisia:  No  Handed:  Right  AIMS (if indicated):     Assets:  Desire for Improvement Housing Physical Health Resilience Social Support  Sleep:  Number of Hours: 8.5  Cognition: Impaired,  Mild  ADL's:  Intact   Mental Status Per Nursing Assessment::   On Admission:  NA  Demographic Factors:  Male  Loss Factors: NA  Historical Factors: History of noncompliance  Risk Reduction Factors:   Sense of responsibility to family, Employed, Living with another person, especially a relative and Positive social support  Continued  Clinical Symptoms:  Schizophrenia:   Paranoid or undifferentiated type  Cognitive Features That Contribute To Risk:  Thought constriction (tunnel vision)    Suicide Risk:  Minimal: No identifiable suicidal ideation.  Patients presenting with no risk factors but with morbid ruminations; may be classified as minimal risk based on the severity of the depressive symptoms   Follow-up Information    [facility] Follow up on 02/04/2021.   Why: You have appointments scheduled for primary care/med managment and behavioral health follow up Tuesday May 17th. First appt is at 10:40am and second appt. is at 11:20am. You can request to see both providers at the same time. Thanks! Contact information: [address] [place] [phone]               Plan Of Care/Follow-up recommendations:  Activity:  Activity as tolerated Diet:  Regular diet Other:  Follow-up outpatient mental health treatment as recommended [facility] health services  [facility], MD 01/20/2021, 10:37 AM

## 2021-01-20 NOTE — Progress Notes (Signed)
Patient has been observed walking around the unit taking a step forward, a step back, stand in place, and then stepping forward again when he goes through doorways. Has not been asking for things or interacting with staff very much this shift. Denies SI, HI and AVH

## 2021-03-18 IMAGING — CT CT HEAD W/O CM
3 series · 15 of 47 positions shown, 18 images · non-contrast
Comparison: CT head without contrast 10/03/2019 at [REDACTED].

CLINICAL DATA: Mental status change.  Delusions.  Psychosis.

EXAM:
CT HEAD WITHOUT CONTRAST
TECHNIQUE: Contiguous axial images were obtained from the base of the skull
through the vertex without intravenous contrast.

[Series 3: coronal soft tissue · coronal · 0.30mm/px · 3 of 67 slices shown]
[im 23/67  brain]
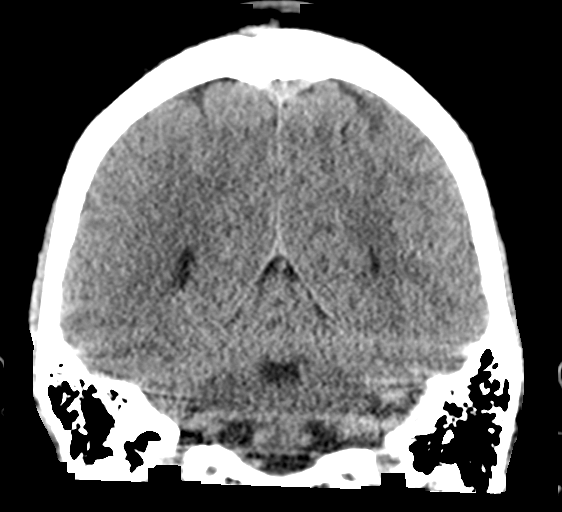
[im 30/67  brain]
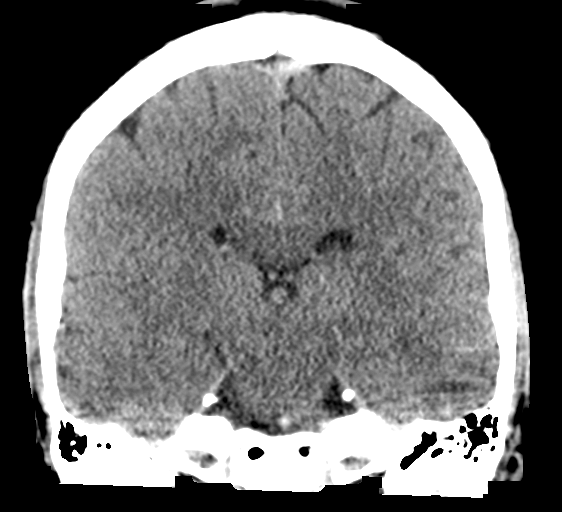
[im 37/67  brain]
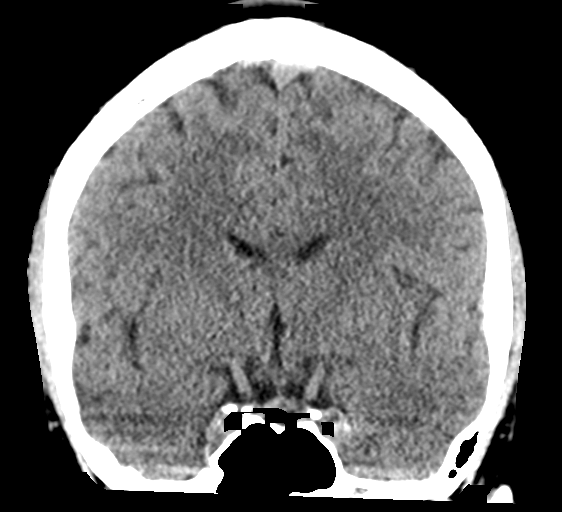

[Series 4: head wo · axial · 0.47mm/px · z∈[-137,-7]mm · 9 of 32 slices shown, 12 images]
[im 3/32  brain]
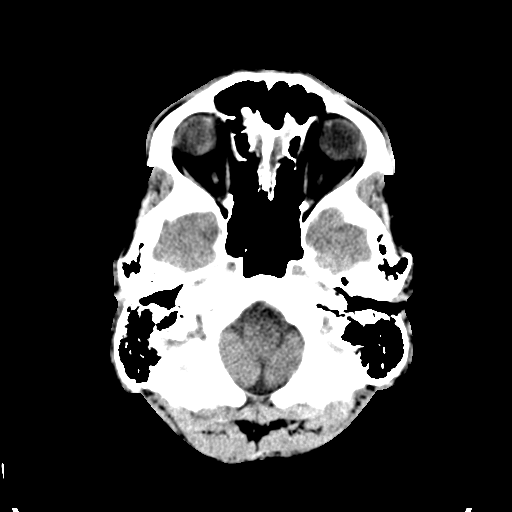
[im 3/32  bone]
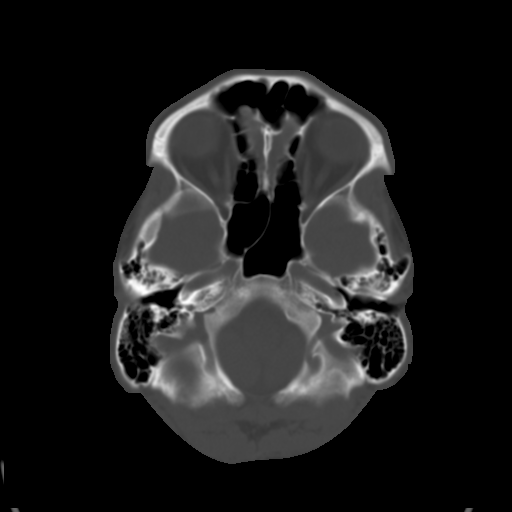
[im 6/32  brain]
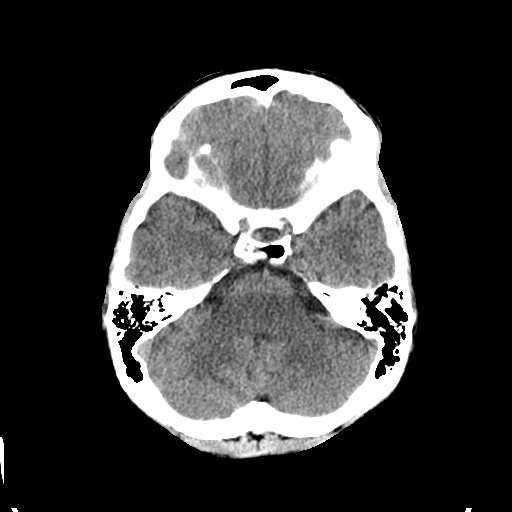
[im 9/32  brain]
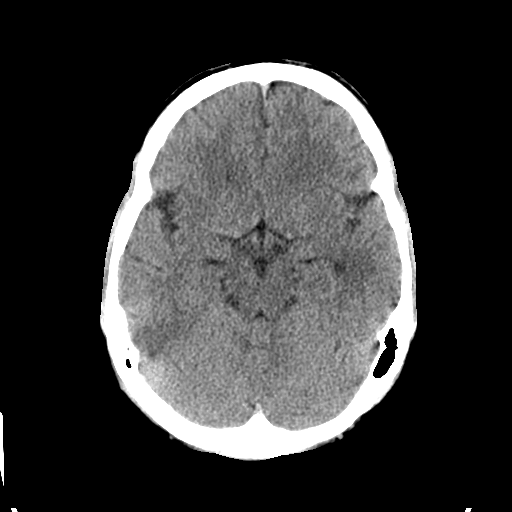
[im 12/32  brain]
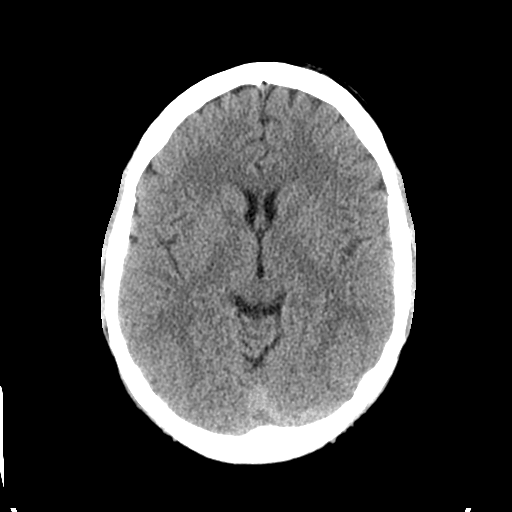
[im 17/32  brain]
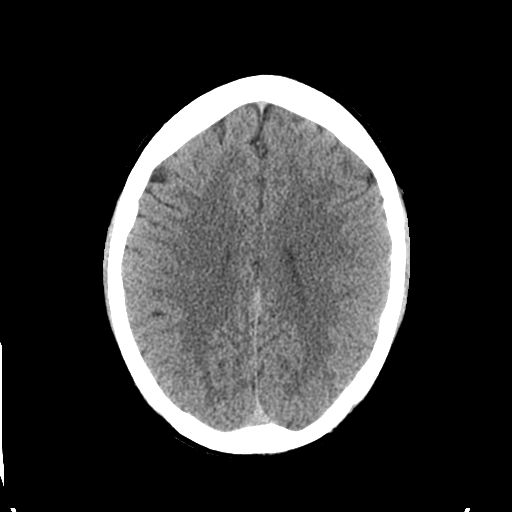
[im 17/32  bone]
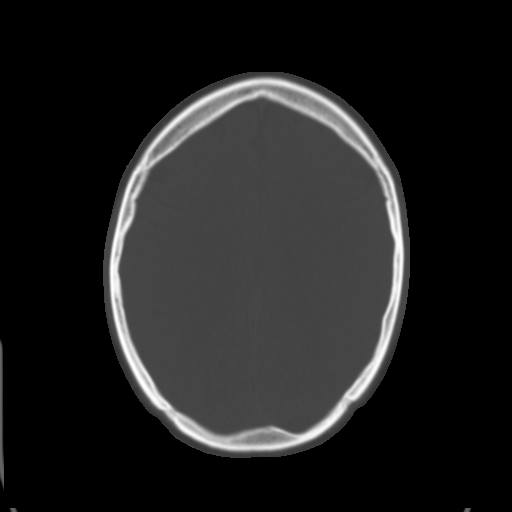
[im 20/32  brain]
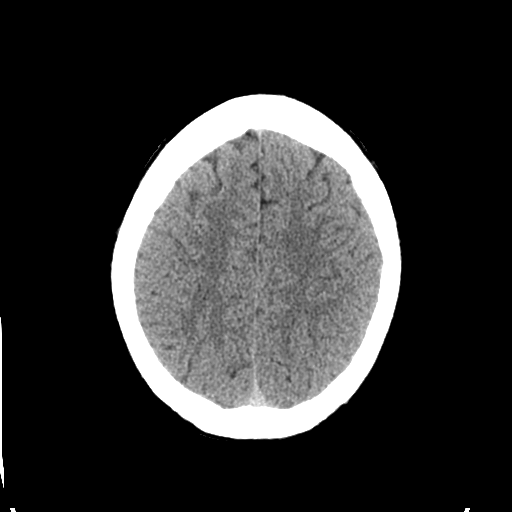
[im 23/32  brain]
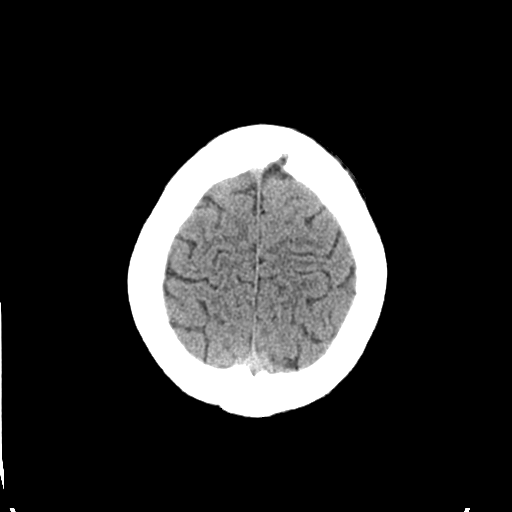
[im 26/32  brain]
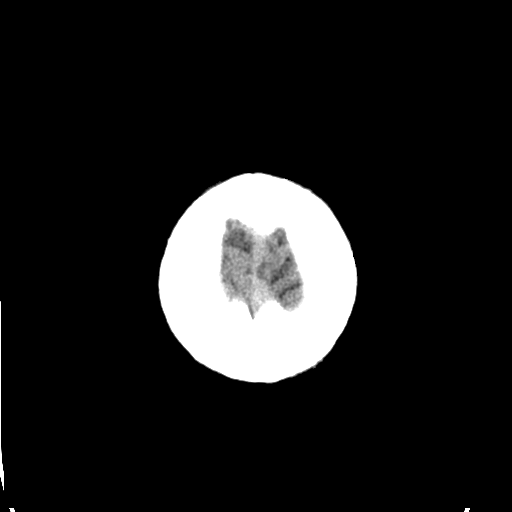
[im 29/32  brain]
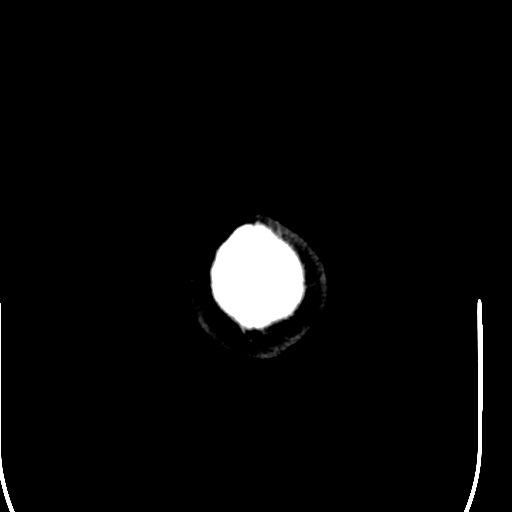
[im 29/32  bone]
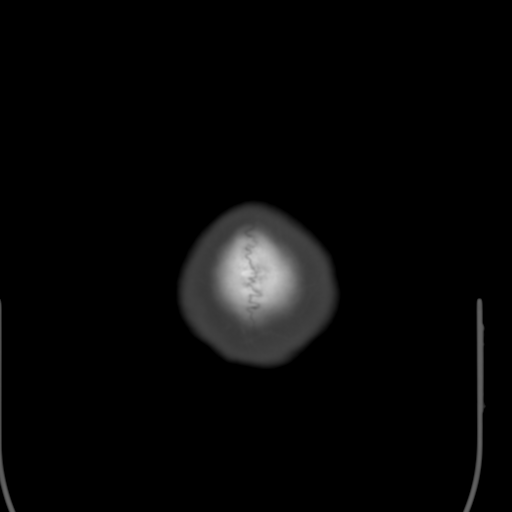

[Series 5: sagittal soft tissue · sagittal · 0.30mm/px · 3 of 57 slices shown]
[im 19/57  brain]
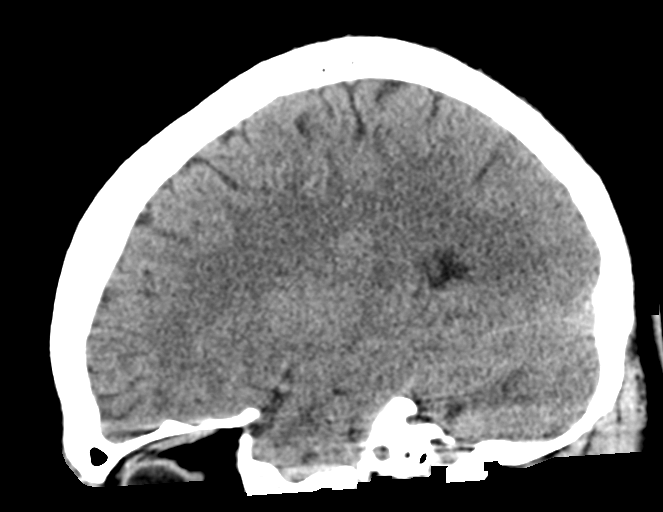
[im 29/57  brain]
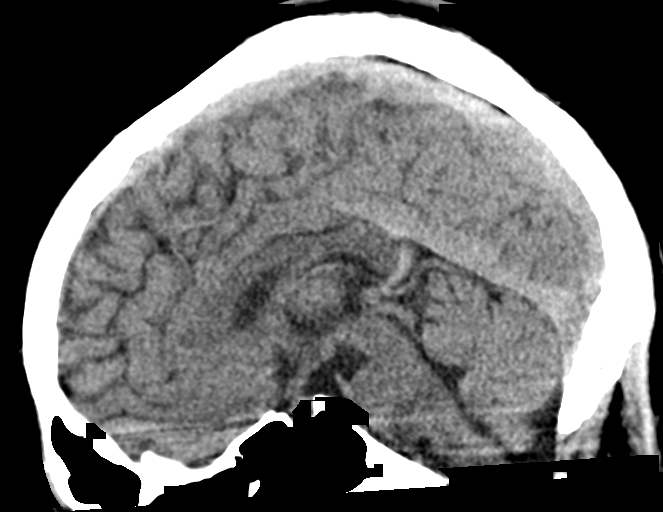
[im 38/57  brain]
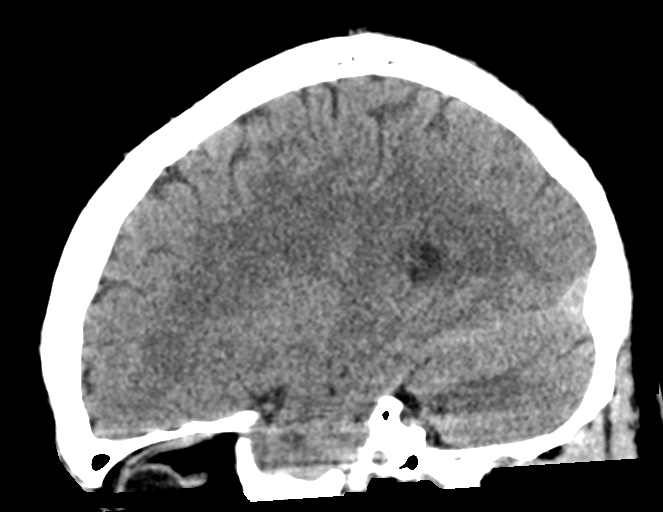

[15 of 47 positions shown; findings below may reference images not displayed]

FINDINGS: Brain: No acute infarct, hemorrhage, or mass lesion is present. No
significant white matter lesions are present. The ventricles are of
normal size. No significant extraaxial fluid collection is present.
The brainstem and cerebellum are within normal limits.

Vascular: No hyperdense vessel or unexpected calcification.

Skull: Calvarium is intact. No focal lytic or blastic lesions are
present. No significant extracranial soft tissue lesion is present.

Sinuses/Orbits: The paranasal sinuses and mastoid air cells are
clear. The globes and orbits are within normal limits.
IMPRESSION: Negative CT of the head.

## 2023-01-13 ENCOUNTER — Other Ambulatory Visit: Payer: Self-pay

## 2023-01-13 DIAGNOSIS — F203 Undifferentiated schizophrenia: Secondary | ICD-10-CM | POA: Diagnosis not present

## 2023-01-13 DIAGNOSIS — R45851 Suicidal ideations: Secondary | ICD-10-CM | POA: Insufficient documentation

## 2023-01-13 DIAGNOSIS — F489 Nonpsychotic mental disorder, unspecified: Secondary | ICD-10-CM

## 2023-01-13 DIAGNOSIS — R462 Strange and inexplicable behavior: Secondary | ICD-10-CM | POA: Diagnosis not present

## 2023-01-13 DIAGNOSIS — R461 Bizarre personal appearance: Secondary | ICD-10-CM | POA: Diagnosis present

## 2023-01-13 LAB — URINE DRUG SCREEN, QUALITATIVE (ARMC ONLY)
Amphetamines, Ur Screen: NOT DETECTED
Barbiturates, Ur Screen: NOT DETECTED
Benzodiazepine, Ur Scrn: NOT DETECTED
Cannabinoid 50 Ng, Ur ~~LOC~~: NOT DETECTED
Cocaine Metabolite,Ur ~~LOC~~: NOT DETECTED
MDMA (Ecstasy)Ur Screen: NOT DETECTED
Methadone Scn, Ur: NOT DETECTED
Opiate, Ur Screen: NOT DETECTED
Phencyclidine (PCP) Ur S: NOT DETECTED
Tricyclic, Ur Screen: NOT DETECTED

## 2023-01-13 LAB — CBC
HCT: 45.4 % (ref 39.0–52.0)
Hemoglobin: 15.3 g/dL (ref 13.0–17.0)
MCH: 30.7 pg (ref 26.0–34.0)
MCHC: 33.7 g/dL (ref 30.0–36.0)
MCV: 91 fL (ref 80.0–100.0)
Platelets: 311 10*3/uL (ref 150–400)
RBC: 4.99 MIL/uL (ref 4.22–5.81)
RDW: 12.1 % (ref 11.5–15.5)
WBC: 11.3 10*3/uL — ABNORMAL HIGH (ref 4.0–10.5)
nRBC: 0 % (ref 0.0–0.2)

## 2023-01-13 LAB — COMPREHENSIVE METABOLIC PANEL
ALT: 14 U/L (ref 0–44)
AST: 24 U/L (ref 15–41)
Albumin: 5.1 g/dL — ABNORMAL HIGH (ref 3.5–5.0)
Alkaline Phosphatase: 67 U/L (ref 38–126)
Anion gap: 9 (ref 5–15)
BUN: 15 mg/dL (ref 6–20)
CO2: 26 mmol/L (ref 22–32)
Calcium: 9.6 mg/dL (ref 8.9–10.3)
Chloride: 101 mmol/L (ref 98–111)
Creatinine, Ser: 1.04 mg/dL (ref 0.61–1.24)
GFR, Estimated: >60 mL/min (ref >60)
Glucose, Bld: 84 mg/dL (ref 70–99)
Potassium: 4 mmol/L (ref 3.5–5.1)
Sodium: 136 mmol/L (ref 135–145)
Total Bilirubin: 1.1 mg/dL (ref 0.3–1.2)
Total Protein: 8 g/dL (ref 6.5–8.1)

## 2023-01-13 LAB — ETHANOL: Alcohol, Ethyl (B): <10 mg/dL (ref <10)

## 2023-01-13 LAB — ACETAMINOPHEN LEVEL: Acetaminophen (Tylenol), Serum: <10 ug/mL — ABNORMAL LOW (ref 10–30)

## 2023-01-13 LAB — SALICYLATE LEVEL: Salicylate Lvl: <7 mg/dL — ABNORMAL LOW (ref 7.0–30.0)

## 2023-01-13 NOTE — ED Notes (Signed)
Pt dressed out in triage room 1 with this Clinical research associate and EDT [employer].  Pt dressed out into burgundy colored BH scrubs.  Pt belongings placed into pt belongings bag and labeled accordingly.    Pt belongings: Blue jeans  Red and white boxer shorts  Optician, dispensing

## 2023-01-13 NOTE — ED Triage Notes (Addendum)
Pt presents to ER with [facility] under IVC.  Pt states he does not want to talk about incidents that happened today.  Per IVC papers, pt was seen trespassing, and PD responded.  Pt was unwilling to talk with police, and became combative.  Pt reportedly has hx of mental illness and has not been taking his meds, and has been losing weight recently related to some possible intentional vomiting.  Pt is cooperative in triage at this time.  Pt denies any pain.  Pt otherwise A&O x4 and in NAD.    Pt denies SI/HI/AVH

## 2023-01-13 NOTE — ED Provider Notes (Signed)
[facility] Provider Note    Event Date/Time   First MD Initiated Contact with Patient 01/13/23 2309     (approximate)   History   Psychiatric Evaluation   HPI  Nial Hawe is a 27 y.o. male   Past medical history of schizophrenia who presents to the emergency department under police IVC for disruptive behaviors at home, combativeness reported by police IVC report.  Family has been concerned about his behavior, refusing to take his medications, refusing to eat.  Reports that he has been vomiting at home.  The patient denies any acute medical complaints.  States that he understands his family wants him here but denies any of the above.  He denies any pain or vomiting.  He is not suicidal, homicidal and denies hallucinations  Independent Historian contributed to assessment above: Emergency planning/management officer who spoke with family and issued the IVC is by the patient's side today to corroborate information given above  External Medical Documents Reviewed: Discharge summary dated 01/20/2021 when she was admitted in psychiatric note states catatonia resolving upon discharge improvement in mental state      Physical Exam   Triage Vital Signs: ED Triage Vitals  Enc Vitals Group     BP 01/13/23 2227 (!) 126/93     Pulse Rate 01/13/23 2227 79     Resp 01/13/23 2227 17     Temp 01/13/23 2227 97.7 F (36.5 C)     Temp Source 01/13/23 2227 Oral     SpO2 01/13/23 2227 99 %     Weight 01/13/23 2232 180 lb (81.6 kg)     Height 01/13/23 2232  (1.803 m)     Head Circumference --      Peak Flow --      Pain Score 01/13/23 2232 0     Pain Loc --      Pain Edu? --      Excl. in GC? --     Most recent vital signs: Vitals:   01/13/23 2227  BP: (!) 126/93  Pulse: 79  Resp: 17  Temp: 97.7 F (36.5 C)  SpO2: 99%    General: Awake, no distress.  CV:  Good peripheral perfusion.  Resp:  Normal effort. Abd:  No distention.  Other:  Wake alert comfortable normal  vital signs cooperative.  Abdomen soft and nontender skin appears warm well-perfused.   ED Results / Procedures / Treatments   Labs (all labs ordered are listed, but only abnormal results are displayed) Labs Reviewed  COMPREHENSIVE METABOLIC PANEL - Abnormal; Notable for the following components:      Result Value   Albumin 5.1 (*)    All other components within normal limits  SALICYLATE LEVEL - Abnormal; Notable for the following components:   Salicylate Lvl <7.0 (*)    All other components within normal limits  ACETAMINOPHEN LEVEL - Abnormal; Notable for the following components:   Acetaminophen (Tylenol), Serum <10 (*)    All other components within normal limits  CBC - Abnormal; Notable for the following components:   WBC 11.3 (*)    All other components within normal limits  ETHANOL  URINE DRUG SCREEN, QUALITATIVE ([facility] ONLY)     I ordered and reviewed the above labs they are notable for mildly elevated white blood cell count otherwise labs within normal limits    PROCEDURES:  Critical Care performed: No  Procedures   MEDICATIONS ORDERED IN ED: Medications - No data to display  IMPRESSION /  MDM / ASSESSMENT AND PLAN / ED COURSE  I reviewed the triage vital signs and the nursing notes.                                Patient's presentation is most consistent with acute presentation with potential threat to life or bodily function.  Differential diagnosis includes, but is not limited to, decompensated psychiatric illness, toxidrome   The patient is on the cardiac monitor to evaluate for evidence of arrhythmia and/or significant heart rate changes.  MDM: Patient noncompliant with medications history of schizophrenia with bizarre behavior, combativeness, home disruptions with no acute medical complaints.  No ingestions.  Obtain basic labs and toxicologic labs with are unremarkable.  He is cooperative at this time.  IVC issued by police.  Psychiatric  consultation        FINAL CLINICAL IMPRESSION(S) / ED DIAGNOSES   Final diagnoses:  Mental health problem     Rx / DC Orders   ED Discharge Orders     None        Note:  This document was prepared using Dragon voice recognition software and may include unintentional dictation errors.    Pilar Jarvis, MD 01/13/23 2322

## 2023-01-14 ENCOUNTER — Emergency Department
Admission: EM | Admit: 2023-01-14 | Discharge: 2023-01-14 | Disposition: A | Discharge status: Home / Self Care | Payer: No Typology Code available for payment source | Attending: Emergency Medicine | Admitting: Emergency Medicine

## 2023-01-14 ENCOUNTER — Encounter: Payer: Self-pay | Admitting: Psychiatry

## 2023-01-14 ENCOUNTER — Inpatient Hospital Stay
Admission: AD | Admit: 2023-01-14 | Discharge: 2023-01-21 | DRG: 885 | Disposition: A | Discharge status: Home / Self Care | Payer: No Typology Code available for payment source | Source: Intra-hospital | Attending: Psychiatry | Admitting: Psychiatry

## 2023-01-14 DIAGNOSIS — F209 Schizophrenia, unspecified: Secondary | ICD-10-CM | POA: Diagnosis present

## 2023-01-14 DIAGNOSIS — F203 Undifferentiated schizophrenia: Secondary | ICD-10-CM | POA: Diagnosis present

## 2023-01-14 DIAGNOSIS — F250 Schizoaffective disorder, bipolar type: Secondary | ICD-10-CM | POA: Insufficient documentation

## 2023-01-14 DIAGNOSIS — F17200 Nicotine dependence, unspecified, uncomplicated: Secondary | ICD-10-CM | POA: Diagnosis present

## 2023-01-14 DIAGNOSIS — R462 Strange and inexplicable behavior: Secondary | ICD-10-CM | POA: Diagnosis present

## 2023-01-14 DIAGNOSIS — Z91199 Patient's noncompliance with other medical treatment and regimen due to unspecified reason: Secondary | ICD-10-CM

## 2023-01-14 DIAGNOSIS — F489 Nonpsychotic mental disorder, unspecified: Secondary | ICD-10-CM

## 2023-01-14 MED ORDER — HALOPERIDOL LACTATE 5 MG/ML IJ SOLN: 5.0000 mg | Freq: Four times a day (QID) | INTRAMUSCULAR | Status: DC | PRN

## 2023-01-14 MED ORDER — DIPHENHYDRAMINE HCL 50 MG/ML IJ SOLN: 50.0000 mg | Freq: Four times a day (QID) | INTRAMUSCULAR | Status: DC | PRN

## 2023-01-14 MED ORDER — LORAZEPAM 2 MG PO TABS: 2.0000 mg | ORAL_TABLET | Freq: Four times a day (QID) | ORAL | Status: DC | PRN

## 2023-01-14 MED ORDER — OLANZAPINE 5 MG PO TBDP
5.0000 mg | ORAL_TABLET | Freq: Two times a day (BID) | ORAL | Status: DC
  Administered 2023-01-15: 5 mg via ORAL
  Filled 2023-01-14: qty 1

## 2023-01-14 MED ORDER — LORAZEPAM 2 MG/ML IJ SOLN: 2.0000 mg | Freq: Four times a day (QID) | INTRAMUSCULAR | Status: DC | PRN

## 2023-01-14 MED ORDER — HALOPERIDOL 5 MG PO TABS: 5.0000 mg | ORAL_TABLET | Freq: Four times a day (QID) | ORAL | Status: DC | PRN

## 2023-01-14 MED ORDER — ACETAMINOPHEN 325 MG PO TABS: 650.0000 mg | ORAL_TABLET | Freq: Four times a day (QID) | ORAL | Status: DC | PRN

## 2023-01-14 MED ORDER — OLANZAPINE 5 MG PO TBDP: 5.0000 mg | ORAL_TABLET | Freq: Two times a day (BID) | ORAL | Status: DC

## 2023-01-14 MED ORDER — DIPHENHYDRAMINE HCL 25 MG PO CAPS: 50.0000 mg | ORAL_CAPSULE | Freq: Four times a day (QID) | ORAL | Status: DC | PRN

## 2023-01-14 MED ORDER — MAGNESIUM HYDROXIDE 400 MG/5ML PO SUSP: 30.0000 mL | Freq: Every day | ORAL | Status: DC | PRN

## 2023-01-14 MED ORDER — ALUM & MAG HYDROXIDE-SIMETH 200-200-20 MG/5ML PO SUSP: 30.0000 mL | ORAL | Status: DC | PRN

## 2023-01-14 MED ORDER — OLANZAPINE 5 MG PO TBDP
5.0000 mg | ORAL_TABLET | Freq: Every day | ORAL | Status: DC
  Filled 2023-01-14: qty 1

## 2023-01-14 NOTE — ED Provider Notes (Signed)
-----------------------------------------   7:57 AM on 01/14/2023 -----------------------------------------   Blood pressure (!) 126/93, pulse 79, temperature 97.7 F (36.5 C), temperature source Oral, resp. rate 17, height  (1.803 m), weight 81.6 kg, SpO2 99 %.  Is resting in bed at this time.There have been no acute events since the last update.  Awaiting disposition plan from psychiatry,   Sharyn Creamer, MD 01/14/23 [phone]

## 2023-01-14 NOTE — BH Assessment (Signed)
Spoke with the patient's sister [phone]), she provided additional collateral information regarding the patient's behaviors and mental state.

## 2023-01-14 NOTE — Tx Team (Signed)
Initial Treatment Plan 01/14/2023 6:58 PM Gregori Abril MWN:[id number]    PATIENT STRESSORS: Medication change or noncompliance     PATIENT STRENGTHS: Supportive family/friends    PATIENT IDENTIFIED PROBLEMS: Medication non compliance  Schizophrenia                   DISCHARGE CRITERIA:  Improved stabilization in mood, thinking, and/or behavior Verbal commitment to aftercare and medication compliance  PRELIMINARY DISCHARGE PLAN: Return to previous living arrangement  PATIENT/FAMILY INVOLVEMENT: This treatment plan has been presented to and reviewed with the patient, Eric Pineda. The patient has been given the opportunity to ask questions and make suggestions.  Roseanne Reno, RN 01/14/2023, 6:58 PM

## 2023-01-14 NOTE — Consult Note (Addendum)
[facility] Face-to-Face Psychiatry Consult   Reason for Consult:  psych consult Referring Physician:  Pilar Jarvis, MD  Patient Identification: Gorje Iyer MRN:  [id number] Principal Diagnosis: Schizophrenia, undifferentiated Diagnosis:  Principal Problem:   Schizophrenia, undifferentiated Active Problems:   Bizarre behavior   Schizophrenia   Total Time spent with patient: 30 minutes  Subjective:   Tahsin Benyo is a 27 y.o. male patient admitted with schizophrenia.  Patient presents alert and oriented to person, place; unable to recall proper date or situation. He reports reason for admission as 'just for a routine check-up'. He is calm and cooperative. Guarded; blunt, incongruent affect. Minimal eye contact. Inconsistent historian. Minimal insight. Reports last [place] IM LAI in January. He denies taking any other medications or treatments. Unable to state current provider or last appointment.   He denies any SI/HI/AVH; appears to be minimizing symptoms and possibly attending to external/internal stimuli. Provided verbal permission to obtain collateral information.   Collateral: Gus Height (mother) [phone] not working  Jamarion [place] (sister) [phone]  Says patient has been dealing with psychiatric issues for a few years. Has had several illicit substance overdoses and hasn't been 'back to normal since'. Has been at home not talking or working, doesn't speak unless asking for a cigarette and wanders off. Patient refuses to take medications; no IM LAI over a year. Suspect 2 weeks ago while family was on vacation patient was acting bizarrely with increased psychotic symptoms; talking to himself and laughing inappropriately.   HPI:  Almon Whitford is a 27 year old male with past psychiatric history of schizophrenia who presented to [facility] ED involuntarily via law enforcement for disruptive behaviors at home, combativeness, refusing to eat, medication non-compliance per  family report.   Past Psychiatric History: schizophreniform disorder, cocaine use disorder, overdose, psychosis,   Risk to Self:  Risk to Others:  Prior Inpatient Therapy:  Prior Outpatient Therapy:   Past Medical History:  Past Medical History:  Diagnosis Date   Bizarre behavior 12/02/2019   History reviewed. No pertinent surgical history. Family History: History reviewed. No pertinent family history. Family Psychiatric History: none noted Social History:  Social History   Substance and Sexual Activity  Alcohol Use None     Social History   Substance and Sexual Activity  Drug Use Not on file    Social History   Socioeconomic History   Marital status: Single    Spouse name: Not on file   Number of children: Not on file   Years of education: Not on file   Highest education level: Not on file  Occupational History   Not on file  Tobacco Use   Smoking status: Light Smoker   Smokeless tobacco: Never   Tobacco comments:    Will not specify how much he smokes  Substance and Sexual Activity   Alcohol use: Not on file   Drug use: Not on file   Sexual activity: Not on file  Other Topics Concern   Not on file  Social History Narrative   Not on file   Social Determinants of Health   Financial Resource Strain: Not on file  Food Insecurity: Not on file  Transportation Needs: Not on file  Physical Activity: Not on file  Stress: Not on file  Social Connections: Not on file   Additional Social History:   Allergies:  No Known Allergies  Labs:  Results for orders placed or performed during the hospital encounter of 01/14/23 (from the past 48 hour(s))  Comprehensive  metabolic panel     Status: Abnormal   Collection Time: 01/13/23 10:35 PM  Result Value Ref Range   Sodium 136 135 - 145 mmol/L   Potassium 4.0 3.5 - 5.1 mmol/L   Chloride 101 98 - 111 mmol/L   CO2 26 22 - 32 mmol/L   Glucose, Bld 84 70 - 99 mg/dL    Comment: Glucose reference range applies only to  samples taken after fasting for at least 8 hours.   BUN 15 6 - 20 mg/dL   Creatinine, Ser 1.61 0.61 - 1.24 mg/dL   Calcium 9.6 8.9 - 09.6 mg/dL   Total Protein 8.0 6.5 - 8.1 g/dL   Albumin 5.1 (H) 3.5 - 5.0 g/dL   AST 24 15 - 41 U/L   ALT 14 0 - 44 U/L   Alkaline Phosphatase 67 38 - 126 U/L   Total Bilirubin 1.1 0.3 - 1.2 mg/dL   GFR, Estimated >04 >54 mL/min    Comment: (NOTE) Calculated using the CKD-EPI Creatinine Equation (2021)    Anion gap 9 5 - 15    Comment: Performed at [facility], [address] [place], [place] [address]  Ethanol     Status: None   Collection Time: 01/13/23 10:35 PM  Result Value Ref Range   Alcohol, Ethyl (B) <10 <10 mg/dL    Comment: (NOTE) Lowest detectable limit for serum alcohol is 10 mg/dL.  For medical purposes only. Performed at [facility], [address] [place], [place] [address]   Salicylate level     Status: Abnormal   Collection Time: 01/13/23 10:35 PM  Result Value Ref Range   Salicylate Lvl <7.0 (L) 7.0 - 30.0 mg/dL    Comment: Performed at [facility], [address] [place], [place] [address]  Acetaminophen level     Status: Abnormal   Collection Time: 01/13/23 10:35 PM  Result Value Ref Range   Acetaminophen (Tylenol), Serum <10 (L) 10 - 30 ug/mL    Comment: (NOTE) Therapeutic concentrations vary significantly. A range of 10-30 ug/mL  may be an effective concentration for many patients. However, some  are best treated at concentrations outside of this range. Acetaminophen concentrations >150 ug/mL at 4 hours after ingestion  and >50 ug/mL at 12 hours after ingestion are often associated with  toxic reactions.  Performed at [facility], [address] [place], [place] [address]   cbc     Status: Abnormal   Collection Time: 01/13/23 10:35 PM  Result Value Ref Range   WBC 11.3 (H) 4.0 - 10.5 K/uL   RBC 4.99 4.22 - 5.81 MIL/uL   Hemoglobin 15.3 13.0 - 17.0 g/dL   HCT 57.8  46.9 - 62.9 %   MCV 91.0 80.0 - 100.0 fL   MCH 30.7 26.0 - 34.0 pg   MCHC 33.7 30.0 - 36.0 g/dL   RDW 52.8 41.3 - 24.4 %   Platelets 311 150 - 400 K/uL   nRBC 0.0 0.0 - 0.2 %    Comment: Performed at [facility], [address], [place], [place] [address]  Urine Drug Screen, Qualitative     Status: None   Collection Time: 01/13/23 10:35 PM  Result Value Ref Range   Tricyclic, Ur Screen NONE DETECTED NONE DETECTED   Amphetamines, Ur Screen NONE DETECTED NONE DETECTED   MDMA (Ecstasy)Ur Screen NONE DETECTED NONE DETECTED   Cocaine Metabolite,Ur [place] NONE DETECTED NONE DETECTED   Opiate, Ur Screen NONE DETECTED NONE  DETECTED   Phencyclidine (PCP) Ur S NONE DETECTED NONE DETECTED   Cannabinoid 50 Ng, Ur [place] NONE DETECTED NONE DETECTED   Barbiturates, Ur Screen NONE DETECTED NONE DETECTED   Benzodiazepine, Ur Scrn NONE DETECTED NONE DETECTED   Methadone Scn, Ur NONE DETECTED NONE DETECTED    Comment: (NOTE) Tricyclics + metabolites, urine    Cutoff 1000 ng/mL Amphetamines + metabolites, urine  Cutoff 1000 ng/mL MDMA (Ecstasy), urine              Cutoff 500 ng/mL Cocaine Metabolite, urine          Cutoff 300 ng/mL Opiate + metabolites, urine        Cutoff 300 ng/mL Phencyclidine (PCP), urine         Cutoff 25 ng/mL Cannabinoid, urine                 Cutoff 50 ng/mL Barbiturates + metabolites, urine  Cutoff 200 ng/mL Benzodiazepine, urine              Cutoff 200 ng/mL Methadone, urine                   Cutoff 300 ng/mL  The urine drug screen provides only a preliminary, unconfirmed analytical test result and should not be used for non-medical purposes. Clinical consideration and professional judgment should be applied to any positive drug screen result due to possible interfering substances. A more specific alternate chemical method must be used in order to obtain a confirmed analytical result. Gas chromatography / mass spectrometry (GC/MS) is the preferred confirm atory  method. Performed at [facility], [address] [place], [place] [address]     Current Facility-Administered Medications  Medication Dose Route Frequency Provider Last Rate Last Admin   diphenhydrAMINE (BENADRYL) capsule 50 mg  50 mg Oral Q6H PRN Leevy-Johnson, Daveyon Kitchings A, NP       Or   diphenhydrAMINE (BENADRYL) injection 50 mg  50 mg Intramuscular Q6H PRN Leevy-Johnson, Baptiste Littler A, NP       haloperidol (HALDOL) tablet 5 mg  5 mg Oral Q6H PRN Leevy-Johnson, Tkai Serfass A, NP       Or   haloperidol lactate (HALDOL) injection 5 mg  5 mg Intramuscular Q6H PRN Leevy-Johnson, Bonni Neuser A, NP       LORazepam (ATIVAN) tablet 2 mg  2 mg Oral Q6H PRN Leevy-Johnson, Kel Senn A, NP       Or   LORazepam (ATIVAN) injection 2 mg  2 mg Intramuscular Q6H PRN Leevy-Johnson, Jet Traynham A, NP       OLANZapine zydis (ZYPREXA) disintegrating tablet 5 mg  5 mg Oral Daily Leevy-Johnson, Markala Sitts A, NP       Followed by   Melene Muller ON 01/15/2023] OLANZapine zydis (ZYPREXA) disintegrating tablet 5 mg  5 mg Oral BID Leevy-Johnson, Mallary Kreger A, NP       Current Outpatient Medications  Medication Sig Dispense Refill   LORazepam (ATIVAN) 0.5 MG tablet Take 1 tablet (0.5 mg total) by mouth 2 (two) times daily. (Patient not taking: Reported on 01/14/2023) 60 tablet 1   paliperidone (INVEGA SUSTENNA) 156 MG/ML SUSY injection Inject 1 mL (156 mg total) into the muscle every 28 (twenty-eight) days. (Patient not taking: Reported on 01/14/2023) 1.2 mL 1    Musculoskeletal: Strength & Muscle Tone: within normal limits Gait & Station: normal Patient leans: N/A  Psychiatric Specialty Exam:  Presentation  General Appearance:  Casual  Eye Contact: Minimal  Speech: Slow  Speech Volume: Decreased  Handedness: Right   Mood and Affect  Mood: Dysphoric  Affect: Flat; Restricted   Thought Process  Thought Processes: Disorganized  Descriptions of Associations:Circumstantial  Orientation:Partial  Thought  Content:Illogical; Perseveration  History of Schizophrenia/Schizoaffective disorder:Yes  Duration of Psychotic Symptoms:No data recorded Hallucinations:Hallucinations: None  Ideas of Reference:None  Suicidal Thoughts:Suicidal Thoughts: No  Homicidal Thoughts:Homicidal Thoughts: No  Sensorium  Memory: Immediate Poor; Recent Poor; Remote Poor  Judgment: Poor  Insight: Poor  Executive Functions  Concentration: Poor  Attention Span: Poor  Recall: Poor  Fund of Knowledge: Poor  Language: Poor  Psychomotor Activity  Psychomotor Activity: Psychomotor Activity: Normal  Assets  Assets: Social Support; Physical Health  Sleep  Sleep: Sleep: Poor  Physical Exam: Physical Exam Vitals and nursing note reviewed.  Constitutional:      Appearance: He is normal weight.  HENT:     Head: Normocephalic.  Neurological:     Mental Status: He is alert.  Psychiatric:        Attention and Perception: He is inattentive. He does not perceive auditory hallucinations.        Mood and Affect: Affect is blunt and flat.        Behavior: Behavior is withdrawn.        Thought Content: Thought content does not include homicidal or suicidal ideation. Thought content does not include homicidal or suicidal plan.        Judgment: Judgment is inappropriate.    Review of Systems  Psychiatric/Behavioral:  Positive for hallucinations.    Blood pressure 93/68, pulse 60, temperature 97.9 F (36.6 C), temperature source Oral, resp. rate 18, height 5\' 11"  (1.803 m), weight 81.6 kg, SpO2 99 %. Body mass index is 25.1 kg/m.  Treatment Plan Summary: Daily contact with patient to assess and evaluate symptoms and progress in treatment, Medication management, and Plan   PLAN:  Start:  Olanzapine 5 mg daily x1 dose; increase to 5 mg BID 01/15/23  Agitation Orders: Haldol 5 mg PO/IM, Benadryl 50 mg IM/PO, or Lorazepam 2 mg IM/PO q6hrs for agitation.   Disposition: Recommend psychiatric  Inpatient admission when medically cleared. Supportive therapy provided about ongoing stressors.  Loletta Parish, NP 01/14/2023 3:51 PM

## 2023-01-14 NOTE — BH Assessment (Signed)
Writer spoke with the patient to complete an updated/reassessment. Patient denies SI/HI and AV/H. However, patient is having some thought blocking. He admits to not taking medications since January. While in his room, he has appeared to be responding to internal stimuli.

## 2023-01-14 NOTE — ED Notes (Signed)
Hospital meal provided, pt tolerated w/o complaints.  Waste discarded appropriately.  

## 2023-01-14 NOTE — BH Assessment (Signed)
Comprehensive Clinical Assessment (CCA) Note  01/14/2023 Eric Pineda [id number]  Chief Complaint: Patient is a 27 year old male presenting to [facility] ED under IVC. Per triage Pt presents to ER with [facility] dept under IVC.  Pt states he does not want to talk about incidents that happened today.  Per IVC papers, pt was seen trespassing, and PD responded.  Pt was unwilling to talk with police, and became combative.  Pt reportedly has hx of mental illness and has not been taking his meds, and has been losing weight recently related to some possible intentional vomiting.  Pt is cooperative in triage at this time.  Pt denies any pain.  Pt otherwise A&O x4 and in NAD.  Pt denies SI/HI/AVH. During assessment patient appears alert and oriented x3, patient is calm and cooperative. Patient does not understand why he is presenting to the ED and his thoughts are somewhat disorganized. When asked about his family he reports "family resort." Patient denies SI/HI/AH/VH.  Per Psyc NP Elenore Paddy patient to be reassessed  Chief Complaint  Patient presents with   Psychiatric Evaluation   Visit Diagnosis: Schizophrenia    CCA Screening, Triage and Referral (STR)  Patient Reported Information How did you hear about [place]? Legal System  Referral name: No data recorded Referral phone number: No data recorded  Whom do you see for routine medical problems? No data recorded Practice/Facility Name: No data recorded Practice/Facility Phone Number: No data recorded Name of Contact: No data recorded Contact Number: No data recorded Contact Fax Number: No data recorded Prescriber Name: No data recorded Prescriber Address (if known): No data recorded  What Is the Reason for Your Visit/Call Today? Pt presents to ER with [facility] under IVC.  Pt states he does not want to talk about incidents that happened today.  Per IVC papers, pt was seen trespassing, and PD responded.  Pt was  unwilling to talk with police, and became combative.  Pt reportedly has hx of mental illness and has not been taking his meds, and has been losing weight recently related to some possible intentional vomiting.  Pt is cooperative in triage at this time.  Pt denies any pain.  Pt otherwise A&O x4 and in NAD.       Pt denies SI/HI/AVH  How Long Has This Been Causing You Problems? > than 6 months  What Do You Feel Would Help You the Most Today? No data recorded  Have You Recently Been in Any Inpatient Treatment (Hospital/Detox/Crisis Center/28-Day Program)? No data recorded Name/Location of Program/Hospital:No data recorded How Long Were You There? No data recorded When Were You Discharged? No data recorded  Have You Ever Received Services From [facility] Before? No data recorded Who Do You See at [facility]? No data recorded  Have You Recently Had Any Thoughts About Hurting Yourself? No  Are You Planning to Commit Suicide/Harm Yourself At This time? No   Have you Recently Had Thoughts About Hurting Someone Karolee Ohs? No  Explanation: No data recorded  Have You Used Any Alcohol or Drugs in the Past 24 Hours? No  How Long Ago Did You Use Drugs or Alcohol? No data recorded What Did You Use and How Much? No data recorded  Do You Currently Have a Therapist/Psychiatrist? No  Name of Therapist/Psychiatrist: No data recorded  Have You Been Recently Discharged From Any Office Practice or Programs? No  Explanation of Discharge From Practice/Program: No data recorded    CCA Screening  Triage Referral Assessment Type of Contact: Face-to-Face  Is this Initial or Reassessment? No data recorded Date Telepsych consult ordered in [facility]:  No data recorded Time Telepsych consult ordered in [facility]:  No data recorded  Patient Reported Information Reviewed? No data recorded Patient Left Without Being Seen? No data recorded Reason for Not Completing Assessment: No data recorded  Collateral Involvement:  No data recorded  Does Patient Have a Court Appointed Legal Guardian? No data recorded Name and Contact of Legal Guardian: No data recorded If Minor and Not Living with Parent(s), Who has Custody? No data recorded Is CPS involved or ever been involved? Never  Is APS involved or ever been involved? Never   Patient Determined To Be At Risk for Harm To Self or Others Based on Review of Patient Reported Information or Presenting Complaint? No  Method: No data recorded Availability of Means: No data recorded Intent: No data recorded Notification Required: No data recorded Additional Information for Danger to Others Potential: No data recorded Additional Comments for Danger to Others Potential: No data recorded Are There Guns or Other Weapons in Your Home? No  Types of Guns/Weapons: No data recorded Are These Weapons Safely Secured?                            No data recorded Who Could Verify You Are Able To Have These Secured: No data recorded Do You Have any Outstanding Charges, Pending Court Dates, Parole/Probation? No data recorded Contacted To Inform of Risk of Harm To Self or Others: No data recorded  Location of Assessment: [facility] ED   Does Patient Present under Involuntary Commitment? Yes  IVC Papers Initial File Date: No data recorded  [place] of Residence: [place]   Patient Currently Receiving the Following Services: No data recorded  Determination of Need: Emergent (2 hours)   Options For Referral: No data recorded    CCA Biopsychosocial Intake/Chief Complaint:  No data recorded Current Symptoms/Problems: No data recorded  Patient Reported Schizophrenia/Schizoaffective Diagnosis in Past: Yes   Strengths: Patient is able to communicate  Preferences: No data recorded Abilities: No data recorded  Type of Services Patient Feels are Needed: No data recorded  Initial Clinical Notes/Concerns: No data recorded  Mental Health Symptoms Depression:   None    Duration of Depressive symptoms: No data recorded  Mania:   Increased Energy; Racing thoughts; Recklessness   Anxiety:    Irritability; Restlessness   Psychosis:   None   Duration of Psychotic symptoms: No data recorded  Trauma:   None   Obsessions:   None   Compulsions:   Absent insight/delusional; "Driven" to perform behaviors/acts; Poor Insight   Inattention:   None   Hyperactivity/Impulsivity:   None   Oppositional/Defiant Behaviors:   Aggression towards people/animals; Temper   Emotional Irregularity:   None   Other Mood/Personality Symptoms:  No data recorded   Mental Status Exam Appearance and self-care  Stature:   Average   Weight:   Average weight   Clothing:   Casual   Grooming:   Normal   Cosmetic use:   None   Posture/gait:   Normal   Motor activity:   Not Remarkable   Sensorium  Attention:   Normal   Concentration:   Normal   Orientation:   Person; Place   Recall/memory:   Defective in Short-term; Defective in Recent   Affect and Mood  Affect:   Anxious   Mood:  Anxious   Relating  Eye contact:   Normal   Facial expression:   Responsive   Attitude toward examiner:   Cooperative   Thought and Language  Speech flow:  Clear and Coherent   Thought content:   Appropriate to Mood and Circumstances   Preoccupation:   None   Hallucinations:   None   Organization:  No data recorded  [employer] of Knowledge:   Fair   Intelligence:   Average   Abstraction:   Functional   Judgement:   Poor   Reality Testing:   Distorted   Insight:   Lacking; Poor   Decision Making:   Impulsive   Social Functioning  Social Maturity:   Impulsive   Social Judgement:   Heedless   Stress  Stressors:   Other (Comment)   Coping Ability:   Exhausted   Skill Deficits:   None   Supports:   Family     Religion: Religion/Spirituality Are You A Religious Person?:  No  Leisure/Recreation: Leisure / Recreation Do You Have Hobbies?: No  Exercise/Diet: Exercise/Diet Do You Exercise?: No Have You Gained or Lost A Significant Amount of Weight in the Past Six Months?: No Do You Follow a Special Diet?: No Do You Have Any Trouble Sleeping?: No   CCA Employment/Education Employment/Work Situation: Employment / Work Situation Employment Situation: Unemployed Patient's Job has Been Impacted by Current Illness: No Has Patient ever Been in Equities trader?: No  Education: Education Is Patient Currently Attending School?: No Did You Have An Individualized Education Program (IIEP): No Did You Have Any Difficulty At [employer]?: No Patient's Education Has Been Impacted by Current Illness: No   CCA Family/Childhood History Family and Relationship History: Family history Marital status: Single Does patient have children?: No  Childhood History:  Childhood History Did patient suffer any verbal/emotional/physical/sexual abuse as a child?: No Did patient suffer from severe childhood neglect?: No Has patient ever been sexually abused/assaulted/raped as an adolescent or adult?: No Was the patient ever a victim of a crime or a disaster?: No Witnessed domestic violence?: No Has patient been affected by domestic violence as an adult?: No  Child/Adolescent Assessment:     CCA Substance Use Alcohol/Drug Use: Alcohol / Drug Use Pain Medications: SEE MAR Prescriptions: SEE MAR Over the Counter: SEE MAR History of alcohol / drug use?: No history of alcohol / drug abuse                         ASAM's:  Six Dimensions of Multidimensional Assessment  Dimension 1:  Acute Intoxication and/or Withdrawal Potential:      Dimension 2:  Biomedical Conditions and Complications:      Dimension 3:  Emotional, Behavioral, or Cognitive Conditions and Complications:     Dimension 4:  Readiness to Change:     Dimension 5:  Relapse, Continued use, or  Continued Problem Potential:     Dimension 6:  Recovery/Living Environment:     ASAM Severity Score:    ASAM Recommended Level of Treatment:     Substance use Disorder (SUD)    Recommendations for Services/Supports/Treatments:    DSM5 Diagnoses: Patient Active Problem List   Diagnosis Date Noted   Schizophrenia, 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

## 2023-01-14 NOTE — Progress Notes (Addendum)
Eric Pineda 27 year old male involuntary admitted from [facility] ED due to disorganized/bizarre behavior,combative with police, medication non-compliance, and lack of appetite with vomiting at home. Patient was seen trespassing and PD was contacted and responded which led to patient acting combative when confronted. Family showing concern for patient's current disruptive behavior at home and reports patient has not taken his Invega injection medications for over 1 year. Per report patient has past illicit substance overdoses and "has not been the same since."   Currently, patient presents flat and guarded with short worded answers. Patient has brief eye contact and appears preoccupied but overall cooperative. Patient denies SI,HI, and A/V/H with no plan or intent. Appears to be minimizing. Disoriented to situation stating he is here for "a check up" and disoriented to time writing the year "2023" on his paperwork despite orientating him on correct date. Patient states he lives with his mom and is unemployed. Patient plans to return home with his mother upon discharge.UDS negative but has history of prior substance abuse. Patient oriented to unit/unit rules. Provided with meal and in no current distress.

## 2023-01-14 NOTE — Consult Note (Signed)
[facility] Face-to-Face Psychiatry Consult   Reason for Consult:Psychiatric Evaluation   Referring Physician: Dr. Modesto Charon [name] MRN:  [id number] Principal Diagnosis: <principal problem not specified> Diagnosis:  Active Problems:   Bizarre behavior   Schizophreniform disorder   Schizophrenia   Schizophrenia, undifferentiated   Total Time spent with patient: 1 hour  Subjective:   Eric Pineda is a 27 y.o. male patient presented to [facility] ED under involuntary commitment status (IVC) via law enforcement, prompted by concerns from the patient's family regarding behavioral issues such as medication refusal and lack of appetite, with reports of vomiting at home. After a face-to-face evaluation by this provider, chart review, and consultation with Dr. Modesto Charon on 01/14/2023, it was determined that the patient would remain under observation overnight for further assessment the following morning. During evaluation, the patient demonstrated alertness and orientation across all four domains, maintaining a calm and cooperative demeanor with mood-congruent affect. Notably, the patient did not display responsiveness to internal or external stimuli and exhibited no signs of delusional thinking, auditory or visual hallucinations, suicidal, homicidal, or self-harm ideations. Moreover, there were no indications of psychotic or paranoid behaviors observed. However, despite appearing cooperative, the patient struggled to provide appropriate responses to assessment questions during the encounter.  HPI: Per Dr. Modesto Charon, [name] is a 27 y.o. male   Past medical history of schizophrenia who presents to the emergency department under police IVC for disruptive behaviors at home, combativeness reported by police IVC report.  Family has been concerned about his behavior, refusing to take his medications, refusing to eat.  Reports that he has been vomiting at home.   The patient denies any acute medical  complaints.  States that he understands his family wants him here but denies any of the above.  He denies any pain or vomiting.  He is not suicidal, homicidal and denies hallucinations   Independent Historian contributed to assessment above: Emergency planning/management officer who spoke with family and issued the IVC is by the patient's side today to corroborate information given above   External Medical Documents Reviewed: Discharge summary dated 01/20/2021 when she was admitted in psychiatric note states catatonia resolving upon discharge improvement in mental state     Past Psychiatric History:  Bizarre behavior  Risk to Self:   Risk to Others:   Prior Inpatient Therapy:   Prior Outpatient Therapy:    Past Medical History:  Past Medical History:  Diagnosis Date   Bizarre behavior 12/02/2019   History reviewed. No pertinent surgical history. Family History: History reviewed. No pertinent family history. Family Psychiatric  History:  Social History:  Social History   Substance and Sexual Activity  Alcohol Use None     Social History   Substance and Sexual Activity  Drug Use Not on file    Social History   Socioeconomic History   Marital status: Single    Spouse name: Not on file   Number of children: Not on file   Years of education: Not on file   Highest education level: Not on file  Occupational History   Not on file  Tobacco Use   Smoking status: Light Smoker   Smokeless tobacco: Never   Tobacco comments:    Will not specify how much he smokes  Substance and Sexual Activity   Alcohol use: Not on file   Drug use: Not on file   Sexual activity: Not on file  Other Topics Concern   Not on file  Social History Narrative  Not on file   Social Determinants of Health   Financial Resource Strain: Not on file  Food Insecurity: Not on file  Transportation Needs: Not on file  Physical Activity: Not on file  Stress: Not on file  Social Connections: Not on file   Additional Social  History:    Allergies:  No Known Allergies  Labs:  Results for orders placed or performed during the hospital encounter of 01/14/23 (from the past 48 hour(s))  Comprehensive metabolic panel     Status: Abnormal   Collection Time: 01/13/23 10:35 PM  Result Value Ref Range   Sodium 136 135 - 145 mmol/L   Potassium 4.0 3.5 - 5.1 mmol/L   Chloride 101 98 - 111 mmol/L   CO2 26 22 - 32 mmol/L   Glucose, Bld 84 70 - 99 mg/dL    Comment: Glucose reference range applies only to samples taken after fasting for at least 8 hours.   BUN 15 6 - 20 mg/dL   Creatinine, Ser 1.61 0.61 - 1.24 mg/dL   Calcium 9.6 8.9 - 09.6 mg/dL   Total Protein 8.0 6.5 - 8.1 g/dL   Albumin 5.1 (H) 3.5 - 5.0 g/dL   AST 24 15 - 41 U/L   ALT 14 0 - 44 U/L   Alkaline Phosphatase 67 38 - 126 U/L   Total Bilirubin 1.1 0.3 - 1.2 mg/dL   GFR, Estimated >04 >54 mL/min    Comment: (NOTE) Calculated using the CKD-EPI Creatinine Equation (2021)    Anion gap 9 5 - 15    Comment: Performed at [facility], [address] [place], [place] [address]  Ethanol     Status: None   Collection Time: 01/13/23 10:35 PM  Result Value Ref Range   Alcohol, Ethyl (B) <10 <10 mg/dL    Comment: (NOTE) Lowest detectable limit for serum alcohol is 10 mg/dL.  For medical purposes only. Performed at [facility], [address] [place], [place] [address]   Salicylate level     Status: Abnormal   Collection Time: 01/13/23 10:35 PM  Result Value Ref Range   Salicylate Lvl <7.0 (L) 7.0 - 30.0 mg/dL    Comment: Performed at [facility], [address] [place], [place] [address]  Acetaminophen level     Status: Abnormal   Collection Time: 01/13/23 10:35 PM  Result Value Ref Range   Acetaminophen (Tylenol), Serum <10 (L) 10 - 30 ug/mL    Comment: (NOTE) Therapeutic concentrations vary significantly. A range of 10-30 ug/mL  may be an effective concentration for many patients. However, some  are best  treated at concentrations outside of this range. Acetaminophen concentrations >150 ug/mL at 4 hours after ingestion  and >50 ug/mL at 12 hours after ingestion are often associated with  toxic reactions.  Performed at [facility], [address] [place], [place] [address]   cbc     Status: Abnormal   Collection Time: 01/13/23 10:35 PM  Result Value Ref Range   WBC 11.3 (H) 4.0 - 10.5 K/uL   RBC 4.99 4.22 - 5.81 MIL/uL   Hemoglobin 15.3 13.0 - 17.0 g/dL   HCT 57.8 46.9 - 62.9 %   MCV 91.0 80.0 - 100.0 fL   MCH 30.7 26.0 - 34.0 pg   MCHC 33.7 30.0 - 36.0 g/dL   RDW 52.8 41.3 - 24.4 %   Platelets 311 150 - 400 K/uL   nRBC 0.0 0.0 - 0.2 %  Comment: Performed at [facility], [address] [place], [place] [address]  Urine Drug Screen, Qualitative     Status: None   Collection Time: 01/13/23 10:35 PM  Result Value Ref Range   Tricyclic, Ur Screen NONE DETECTED NONE DETECTED   Amphetamines, Ur Screen NONE DETECTED NONE DETECTED   MDMA (Ecstasy)Ur Screen NONE DETECTED NONE DETECTED   Cocaine Metabolite,Ur [place] NONE DETECTED NONE DETECTED   Opiate, Ur Screen NONE DETECTED NONE DETECTED   Phencyclidine (PCP) Ur S NONE DETECTED NONE DETECTED   Cannabinoid 50 Ng, Ur [place] NONE DETECTED NONE DETECTED   Barbiturates, Ur Screen NONE DETECTED NONE DETECTED   Benzodiazepine, Ur Scrn NONE DETECTED NONE DETECTED   Methadone Scn, Ur NONE DETECTED NONE DETECTED    Comment: (NOTE) Tricyclics + metabolites, urine    Cutoff 1000 ng/mL Amphetamines + metabolites, urine  Cutoff 1000 ng/mL MDMA (Ecstasy), urine              Cutoff 500 ng/mL Cocaine Metabolite, urine          Cutoff 300 ng/mL Opiate + metabolites, urine        Cutoff 300 ng/mL Phencyclidine (PCP), urine         Cutoff 25 ng/mL Cannabinoid, urine                 Cutoff 50 ng/mL Barbiturates + metabolites, urine  Cutoff 200 ng/mL Benzodiazepine, urine              Cutoff 200 ng/mL Methadone, urine                    Cutoff 300 ng/mL  The urine drug screen provides only a preliminary, unconfirmed analytical test result and should not be used for non-medical purposes. Clinical consideration and professional judgment should be applied to any positive drug screen result due to possible interfering substances. A more specific alternate chemical method must be used in order to obtain a confirmed analytical result. Gas chromatography / mass spectrometry (GC/MS) is the preferred confirm atory method. Performed at [facility], [address] [place], [place] [address]     No current facility-administered medications for this encounter.   Current Outpatient Medications  Medication Sig Dispense Refill   LORazepam (ATIVAN) 0.5 MG tablet Take 1 tablet (0.5 mg total) by mouth 2 (two) times daily. 60 tablet 1   paliperidone (INVEGA SUSTENNA) 156 MG/ML SUSY injection Inject 1 mL (156 mg total) into the muscle every 28 (twenty-eight) days. 1.2 mL 1    Musculoskeletal: Strength & Muscle Tone: within normal limits Gait & Station: normal Patient leans: N/A Psychiatric Specialty Exam:  Presentation  General Appearance:  Casual  Eye Contact: Minimal  Speech: Slow  Speech Volume: Other (comment)  Handedness: Right   Mood and Affect  Mood: Euphoric  Affect: Congruent   Thought Process  Thought Processes: Disorganized  Descriptions of Associations:Loose  Orientation:Partial  Thought Content:Other (comment)  History of Schizophrenia/Schizoaffective disorder:No data recorded Duration of Psychotic Symptoms:No data recorded Hallucinations:Hallucinations: None  Ideas of Reference:None  Suicidal Thoughts:Suicidal Thoughts: No  Homicidal Thoughts:Homicidal Thoughts: No   Sensorium  Memory: Immediate Poor; Recent Poor; Remote Poor  Judgment: Poor  Insight: Poor   Executive Functions  Concentration: Poor  Attention Span: Poor  Recall: Poor  Fund of  Knowledge: Poor  Language: Poor   Psychomotor Activity  Psychomotor Activity: Psychomotor Activity: Normal   Assets  Assets: Social Support   Sleep  Sleep: Sleep: Good  Physical Exam: Physical Exam Vitals and nursing note reviewed.  Constitutional:      Appearance: Normal appearance.  HENT:     Head: Normocephalic and atraumatic.     Right Ear: External ear normal.     Left Ear: External ear normal.     Nose: Nose normal.     Mouth/Throat:     Pharynx: Oropharynx is clear.  Cardiovascular:     Rate and Rhythm: Normal rate.     Pulses: Normal pulses.  Pulmonary:     Effort: Pulmonary effort is normal.  Musculoskeletal:        General: Normal range of motion.     Cervical back: Normal range of motion and neck supple.  Neurological:     Mental Status: He is alert. He is disoriented.  Psychiatric:        Attention and Perception: Attention and perception normal.        Mood and Affect: Affect is flat and inappropriate.        Speech: Speech is delayed.        Behavior: Behavior is slowed. Behavior is cooperative.        Thought Content: Thought content normal.        Cognition and Memory: Memory normal. Cognition is impaired.        Judgment: Judgment is inappropriate.    Review of Systems  Psychiatric/Behavioral:  The patient is nervous/anxious.    Blood pressure (!) 126/93, pulse 79, temperature 97.7 F (36.5 C), temperature source Oral, resp. rate 17, height  (1.803 m), weight 81.6 kg, SpO2 99 %. Body mass index is 25.1 kg/m.  Treatment Plan Summary: Plan   The patient will remain under observation overnight and be reassessed in the morning to determine the appropriate course of action.   Disposition: Supportive therapy provided about ongoing stressors. The patient will remain under observation overnight and be reassessed in the morning to determine the appropriate course of action.   Gillermo Murdoch, NP 01/14/2023 4:30 AM

## 2023-01-15 DIAGNOSIS — F203 Undifferentiated schizophrenia: Secondary | ICD-10-CM | POA: Diagnosis not present

## 2023-01-15 MED ORDER — BENZTROPINE MESYLATE 1 MG PO TABS
0.5000 mg | ORAL_TABLET | Freq: Every day | ORAL | Status: DC
  Administered 2023-01-15 – 2023-01-20 (×5): 0.5 mg via ORAL
  Filled 2023-01-15 (×6): qty 1

## 2023-01-15 MED ORDER — LORAZEPAM 1 MG PO TABS
1.0000 mg | ORAL_TABLET | Freq: Four times a day (QID) | ORAL | Status: DC
  Administered 2023-01-15 – 2023-01-21 (×19): 1 mg via ORAL
  Filled 2023-01-15 (×20): qty 1

## 2023-01-15 MED ORDER — PALIPERIDONE ER 3 MG PO TB24
9.0000 mg | ORAL_TABLET | Freq: Every day | ORAL | Status: DC
  Administered 2023-01-15 – 2023-01-20 (×5): 9 mg via ORAL
  Filled 2023-01-15 (×6): qty 3

## 2023-01-15 NOTE — [facility] Suicide Risk Assessment (Signed)
[facility] Admission Suicide Risk Assessment   Nursing information obtained from:  Patient Demographic factors:  Male, Adolescent or young adult, Low socioeconomic status Current Mental Status:  Suicidal ideation indicated by others Loss Factors:  NA Historical Factors:  Prior suicide attempts Risk Reduction Factors:  Sense of responsibility to family, Living with another person, especially a relative, Positive social support  Total Time spent with patient: 30 minutes Principal Problem: Schizophrenia (HCC) Diagnosis:  Principal Problem:   Schizophrenia (HCC)  Subjective Data: Patient seen and chart reviewed.  Patient also attended treatment team although very briefly.  27 year old man with a history of schizophrenia.  Brought to the hospital because of worsening confusion and agitation and bizarre behavior.  Patient was unable to engage in an interview or share any information today.  He has been pacing around the unit glaring at things in an uncomfortable way.  When he sat down to talk with me he immediately walked out of the room saying that he needed to use the bathroom and never came back.  There has been no report of any violent behavior or threats of suicide and he has no history of suicidal behavior  Continued Clinical Symptoms:  Alcohol Use Disorder Identification Test Final Score (AUDIT): 0 The "Alcohol Use Disorders Identification Test", Guidelines for Use in Primary Care, Second Edition.  [employer] [facility]). Score between 0-7:  no or low risk or alcohol related problems. Score between 8-15:  moderate risk of alcohol related problems. Score between 16-19:  high risk of alcohol related problems. Score 20 or above:  warrants further diagnostic evaluation for alcohol dependence and treatment.   CLINICAL FACTORS:   Schizophrenia:   Less than 93 years old   Musculoskeletal: Strength & Muscle Tone: within normal limits Gait & Station: normal Patient leans:  N/A  Psychiatric Specialty Exam:  Presentation  General Appearance:  Casual  Eye Contact: Minimal  Speech: Slow  Speech Volume: Decreased  Handedness: Right   Mood and Affect  Mood: Dysphoric  Affect: Flat; Restricted   Thought Process  Thought Processes: Disorganized  Descriptions of Associations:Circumstantial  Orientation:Partial  Thought Content:Illogical; Perseveration  History of Schizophrenia/Schizoaffective disorder:Yes  Duration of Psychotic Symptoms:No data recorded Hallucinations:Hallucinations: None  Ideas of Reference:None  Suicidal Thoughts:Suicidal Thoughts: No  Homicidal Thoughts:Homicidal Thoughts: No   Sensorium  Memory: Immediate Poor; Recent Poor; Remote Poor  Judgment: Poor  Insight: Poor   Executive Functions  Concentration: Poor  Attention Span: Poor  Recall: Poor  Fund of Knowledge: Poor  Language: Poor   Psychomotor Activity  Psychomotor Activity: Psychomotor Activity: Normal   Assets  Assets: Social Support; Physical Health   Sleep  Sleep: Sleep: Poor    Physical Exam: Physical Exam Vitals and nursing note reviewed.  Constitutional:      Appearance: Normal appearance.  HENT:     Head: Normocephalic and atraumatic.     Mouth/Throat:     Pharynx: Oropharynx is clear.  Eyes:     Pupils: Pupils are equal, round, and reactive to light.  Cardiovascular:     Rate and Rhythm: Normal rate and regular rhythm.  Pulmonary:     Effort: Pulmonary effort is normal.     Breath sounds: Normal breath sounds.  Abdominal:     General: Abdomen is flat.     Palpations: Abdomen is soft.  Musculoskeletal:        General: Normal range of motion.  Skin:    General: Skin is warm and dry.  Neurological:  General: No focal deficit present.     Mental Status: He is alert. Mental status is at baseline.  Psychiatric:        Attention and Perception: He is inattentive.        Mood and Affect: Mood  normal. Affect is blunt.        Speech: He is noncommunicative.        Behavior: Behavior is withdrawn.        Cognition and Memory: Cognition is impaired.        Judgment: Judgment is inappropriate.    Review of Systems  Unable to perform ROS: Psychiatric disorder   Blood pressure 114/77, pulse 65, temperature 97.7 F (36.5 C), temperature source Oral, resp. rate 18, height 5\' 11"  (1.803 m), weight 66.2 kg, SpO2 100 %. Body mass index is 20.36 kg/m.   COGNITIVE FEATURES THAT CONTRIBUTE TO RISK:  Loss of executive function    SUICIDE RISK:   Minimal: No identifiable suicidal ideation.  Patients presenting with no risk factors but with morbid ruminations; may be classified as minimal risk based on the severity of the depressive symptoms  PLAN OF CARE: Continue 15-minute checks.  Patient is being started on Invega as the long-acting injectable with his previous medication.  Labs reviewed.  Daily assessment of condition and dangerousness along with any discharge planning  I certify that inpatient services furnished can reasonably be expected to improve the patient's condition.   Mordecai Rasmussen, MD 01/15/2023, 4:26 PM

## 2023-01-15 NOTE — Plan of Care (Signed)
  Problem: Education: Goal: Knowledge of General Education information will improve Description: Including pain rating scale, medication(s)/side effects and non-pharmacologic comfort measures Outcome: Not Progressing   Problem: Safety: Goal: Ability to remain free from injury will improve Outcome: Progressing   

## 2023-01-15 NOTE — H&P (Signed)
Psychiatric Admission Assessment Adult  [name] MRN:  [id number] Date of Evaluation:  01/15/2023 Chief Complaint:  Schizoaffective disorder, bipolar type (HCC) [F25.0] Principal Diagnosis: Schizophrenia (HCC) Diagnosis:  Principal Problem:   Schizophrenia (HCC)  History of Present Illness: Patient seen and chart reviewed.  This is a 27 year old man with a history of schizophrenia who is well-known to our service.  He came to the emergency room with reports from his family that he has been increasingly bizarre in his behavior not communicating not taking care of himself well.  Today the patient was resistant to engaging in any kind of interview.  He came to treatment team meeting and sat down for a moment and then immediately dismissed himself from the room and never came back.  He did the same thing when I tried to sit down with him individually this afternoon.  He has been spending the day pacing back and forth between his room in the day room.  Not doing anything violent but not really interacting much.  Just sort of staring.  Drug screen was negative.  Family collateral was that he has been off medication probably for a indefinite or long amount of time. Associated Signs/Symptoms: Depression Symptoms:  difficulty concentrating, (Hypo) Manic Symptoms:  Impulsivity, Anxiety Symptoms:   Unable to subjectively report Psychotic Symptoms:   Very disorganized.  Uncooperative.  Bordering on the catatonic PTSD Symptoms: Negative Total Time spent with patient: 45 minutes  Past Psychiatric History: Patient has a history of schizophrenia or schizoaffective disorder.  In the past he has occasionally presented with catatonic symptoms that will come and go.  Last time he was in the hospital we treated him with long-acting injectable antipsychotic and modest doses of benzodiazepines.  Right now he looks like he is on the border of possibly having a catatonic spell with his  inability to cooperate and some of his stereotyped behaviors.  I am going to start him back on Invega and Ativan.  Hopefully he will take it and we will be able to start the transition to long-acting injectable.  Continue 15-minute checks.  Labs reviewed.  Is the patient at risk to self? Yes.    Has the patient been a risk to self in the past 6 months? Yes.    Has the patient been a risk to self within the distant past? Yes.    Is the patient a risk to others? No.  Has the patient been a risk to others in the past 6 months? No.  Has the patient been a risk to others within the distant past? No.   [place] Scale:  Flowsheet Row Admission (Current) from 01/14/2023 in [facility] INPATIENT BEHAVIORAL MEDICINE Most recent reading at 01/14/2023  6:15 PM ED from 01/14/2023 in [facility] Emergency Department at [facility] Most recent reading at 01/13/2023 10:33 PM Admission (Discharged) from 01/09/2021 in [facility] INPATIENT BEHAVIORAL MEDICINE Most recent reading at 01/09/2021  9:54 PM  C-SSRS RISK CATEGORY No Risk No Risk No Risk        Prior Inpatient Therapy: Yes.   If yes, describe last 1 we know of was about 2 years ago Prior Outpatient Therapy: Yes.   If yes, describe unclear how long it has been since he followed up  Alcohol Screening: 1. How often do you have a drink containing alcohol?: Never 2. How many drinks containing alcohol do you have on a typical day when you are drinking?: 1 or 2 3. How often do you have  six or more drinks on one occasion?: Never AUDIT-C Score: 0 4. How often during the last year have you found that you were not able to stop drinking once you had started?: Never 5. How often during the last year have you failed to do what was normally expected from you because of drinking?: Never 6. How often during the last year have you needed a first drink in the morning to get yourself going after a heavy drinking session?: Never 7. How often during the last year have you had a  feeling of guilt of remorse after drinking?: Never 8. How often during the last year have you been unable to remember what happened the night before because you had been drinking?: Never 9. Have you or someone else been injured as a result of your drinking?: No 10. Has a relative or friend or a doctor or another health worker been concerned about your drinking or suggested you cut down?: No Alcohol Use Disorder Identification Test Final Score (AUDIT): 0 Alcohol Brief Interventions/Follow-up: Patient Refused Substance Abuse History in the last 12 months:  No. Consequences of Substance Abuse: Does not have a past history of substance use as an issue Previous Psychotropic Medications: Yes  Psychological Evaluations: Yes  Past Medical History:  Past Medical History:  Diagnosis Date   Bizarre behavior 12/02/2019   History reviewed. No pertinent surgical history. Family History: History reviewed. No pertinent family history. Family Psychiatric  History: Nothing reported Tobacco Screening:  Social History   Tobacco Use  Smoking Status Light Smoker  Smokeless Tobacco Never  Tobacco Comments   Will not specify how much he smokes    BH Tobacco Counseling     Are you interested in Tobacco Cessation Medications?  No, patient refused Counseled patient on smoking cessation:  Refused/Declined practical counseling Reason Tobacco Screening Not Completed: No value filed.       Social History:  Social History   Substance and Sexual Activity  Alcohol Use None     Social History   Substance and Sexual Activity  Drug Use Not on file    Additional Social History: Marital status: Single Are you sexually active?: No What is your sexual orientation?: Pt reports "heterosexual" Has your sexual activity been affected by drugs, alcohol, medication, or emotional stress?: N/A Does patient have children?: No                         Allergies:  No Known Allergies Lab Results:  Results  for orders placed or performed during the hospital encounter of 01/14/23 (from the past 48 hour(s))  Comprehensive metabolic panel     Status: Abnormal   Collection Time: 01/13/23 10:35 PM  Result Value Ref Range   Sodium 136 135 - 145 mmol/L   Potassium 4.0 3.5 - 5.1 mmol/L   Chloride 101 98 - 111 mmol/L   CO2 26 22 - 32 mmol/L   Glucose, Bld 84 70 - 99 mg/dL    Comment: Glucose reference range applies only to samples taken after fasting for at least 8 hours.   BUN 15 6 - 20 mg/dL   Creatinine, Ser 1.61 0.61 - 1.24 mg/dL   Calcium 9.6 8.9 - 09.6 mg/dL   Total Protein 8.0 6.5 - 8.1 g/dL   Albumin 5.1 (H) 3.5 - 5.0 g/dL   AST 24 15 - 41 U/L   ALT 14 0 - 44 U/L   Alkaline Phosphatase 67 38 - 126 U/L  Total Bilirubin 1.1 0.3 - 1.2 mg/dL   GFR, Estimated >16 >10 mL/min    Comment: (NOTE) Calculated using the CKD-EPI Creatinine Equation (2021)    Anion gap 9 5 - 15    Comment: Performed at [facility], [address] [place], [place] [address]  Ethanol     Status: None   Collection Time: 01/13/23 10:35 PM  Result Value Ref Range   Alcohol, Ethyl (B) <10 <10 mg/dL    Comment: (NOTE) Lowest detectable limit for serum alcohol is 10 mg/dL.  For medical purposes only. Performed at [facility], [address] [place], [place] [address]   Salicylate level     Status: Abnormal   Collection Time: 01/13/23 10:35 PM  Result Value Ref Range   Salicylate Lvl <7.0 (L) 7.0 - 30.0 mg/dL    Comment: Performed at [facility], [address] [place], [place] [address]  Acetaminophen level     Status: Abnormal   Collection Time: 01/13/23 10:35 PM  Result Value Ref Range   Acetaminophen (Tylenol), Serum <10 (L) 10 - 30 ug/mL    Comment: (NOTE) Therapeutic concentrations vary significantly. A range of 10-30 ug/mL  may be an effective concentration for many patients. However, some  are best treated at concentrations outside of this range. Acetaminophen  concentrations >150 ug/mL at 4 hours after ingestion  and >50 ug/mL at 12 hours after ingestion are often associated with  toxic reactions.  Performed at [facility], [address] [place], [place] [address]   cbc     Status: Abnormal   Collection Time: 01/13/23 10:35 PM  Result Value Ref Range   WBC 11.3 (H) 4.0 - 10.5 K/uL   RBC 4.99 4.22 - 5.81 MIL/uL   Hemoglobin 15.3 13.0 - 17.0 g/dL   HCT 21.3 08.6 - 57.8 %   MCV 91.0 80.0 - 100.0 fL   MCH 30.7 26.0 - 34.0 pg   MCHC 33.7 30.0 - 36.0 g/dL   RDW 46.9 62.9 - 52.8 %   Platelets 311 150 - 400 K/uL   nRBC 0.0 0.0 - 0.2 %    Comment: Performed at [facility], [address] [place], [place] [address]  Urine Drug Screen, Qualitative     Status: None   Collection Time: 01/13/23 10:35 PM  Result Value Ref Range   Tricyclic, Ur Screen NONE DETECTED NONE DETECTED   Amphetamines, Ur Screen NONE DETECTED NONE DETECTED   MDMA (Ecstasy)Ur Screen NONE DETECTED NONE DETECTED   Cocaine Metabolite,Ur [place] NONE DETECTED NONE DETECTED   Opiate, Ur Screen NONE DETECTED NONE DETECTED   Phencyclidine (PCP) Ur S NONE DETECTED NONE DETECTED   Cannabinoid 50 Ng, Ur  NONE DETECTED NONE DETECTED   Barbiturates, Ur Screen NONE DETECTED NONE DETECTED   Benzodiazepine, Ur Scrn NONE DETECTED NONE DETECTED   Methadone Scn, Ur NONE DETECTED NONE DETECTED    Comment: (NOTE) Tricyclics + metabolites, urine    Cutoff 1000 ng/mL Amphetamines + metabolites, urine  Cutoff 1000 ng/mL MDMA (Ecstasy), urine              Cutoff 500 ng/mL Cocaine Metabolite, urine          Cutoff 300 ng/mL Opiate + metabolites, urine        Cutoff 300 ng/mL Phencyclidine (PCP), urine         Cutoff 25 ng/mL Cannabinoid, urine  Cutoff 50 ng/mL Barbiturates + metabolites, urine  Cutoff 200 ng/mL Benzodiazepine, urine              Cutoff 200 ng/mL Methadone, urine                   Cutoff 300 ng/mL  The urine drug screen provides only a  preliminary, unconfirmed analytical test result and should not be used for non-medical purposes. Clinical consideration and professional judgment should be applied to any positive drug screen result due to possible interfering substances. A more specific alternate chemical method must be used in order to obtain a confirmed analytical result. Gas chromatography / mass spectrometry (GC/MS) is the preferred confirm atory method. Performed at [facility], [address] [place], [place] [address]     Blood Alcohol level:  Lab Results  Component Value Date   [facility] <10 01/13/2023   ETH <10 01/08/2021    Metabolic Disorder Labs:  Lab Results  Component Value Date   HGBA1C 5.1 12/03/2019   MPG 99.67 12/03/2019   No results found for: "PROLACTIN" Lab Results  Component Value Date   CHOL 171 10/04/2020   TRIG 61 10/04/2020   HDL 55 10/04/2020   CHOLHDL 3.1 10/04/2020   VLDL 12 10/04/2020   LDLCALC 104 (H) 10/04/2020    Current Medications: Current Facility-Administered Medications  Medication Dose Route Frequency Provider Last Rate Last Admin   acetaminophen (TYLENOL) tablet 650 mg  650 mg Oral Q6H PRN Leevy-Johnson, Brooke A, NP       alum & mag hydroxide-simeth (MAALOX/MYLANTA) 200-200-20 MG/5ML suspension 30 mL  30 mL Oral Q4H PRN Leevy-Johnson, Brooke A, NP       benztropine (COGENTIN) tablet 0.5 mg  0.5 mg Oral QHS Adore Kithcart T, MD       diphenhydrAMINE (BENADRYL) capsule 50 mg  50 mg Oral Q6H PRN Leevy-Johnson, Brooke A, NP       Or   diphenhydrAMINE (BENADRYL) injection 50 mg  50 mg Intramuscular Q6H PRN Leevy-Johnson, Brooke A, NP       haloperidol (HALDOL) tablet 5 mg  5 mg Oral Q6H PRN Leevy-Johnson, Brooke A, NP       Or   haloperidol lactate (HALDOL) injection 5 mg  5 mg Intramuscular Q6H PRN Leevy-Johnson, Brooke A, NP       LORazepam (ATIVAN) tablet 2 mg  2 mg Oral Q6H PRN Leevy-Johnson, Brooke A, NP       Or   LORazepam (ATIVAN) injection 2 mg  2  mg Intramuscular Q6H PRN Leevy-Johnson, Brooke A, NP       magnesium hydroxide (MILK OF MAGNESIA) suspension 30 mL  30 mL Oral Daily PRN Leevy-Johnson, Brooke A, NP       paliperidone (INVEGA) 24 hr tablet 9 mg  9 mg Oral QHS Lynze Reddy T, MD       PTA Medications: Medications Prior to Admission  Medication Sig Dispense Refill Last Dose   LORazepam (ATIVAN) 0.5 MG tablet Take 1 tablet (0.5 mg total) by mouth 2 (two) times daily. (Patient not taking: Reported on 01/14/2023) 60 tablet 1    paliperidone (INVEGA SUSTENNA) 156 MG/ML SUSY injection Inject 1 mL (156 mg total) into the muscle every 28 (twenty-eight) days. (Patient not taking: Reported on 01/14/2023) 1.2 mL 1     Musculoskeletal: Strength & Muscle Tone: within normal limits Gait & Station: normal Patient leans: N/A            Psychiatric Specialty  Exam:  Presentation  General Appearance:  Casual  Eye Contact: Minimal  Speech: Slow  Speech Volume: Decreased  Handedness: Right   Mood and Affect  Mood: Dysphoric  Affect: Flat; Restricted   Thought Process  Thought Processes: Disorganized  Duration of Psychotic Symptoms: Chronic.  Not clear whether they have ever completely subsided.  This current period of worsening seems to be over a week or more Past Diagnosis of Schizophrenia or Psychoactive disorder: Yes  Descriptions of Associations:Circumstantial  Orientation:Partial  Thought Content:Illogical; Perseveration  Hallucinations:Hallucinations: None  Ideas of Reference:None  Suicidal Thoughts:Suicidal Thoughts: No  Homicidal Thoughts:Homicidal Thoughts: No   Sensorium  Memory: Immediate Poor; Recent Poor; Remote Poor  Judgment: Poor  Insight: Poor   Executive Functions  Concentration: Poor  Attention Span: Poor  Recall: Poor  Fund of Knowledge: Poor  Language: Poor   Psychomotor Activity  Psychomotor Activity: Psychomotor Activity: Normal   Assets   Assets: Social Support; Physical Health   Sleep  Sleep: Sleep: Poor    Physical Exam: Physical Exam Vitals and nursing note reviewed.  Constitutional:      Appearance: Normal appearance.  HENT:     Head: Normocephalic and atraumatic.     Mouth/Throat:     Pharynx: Oropharynx is clear.  Eyes:     Pupils: Pupils are equal, round, and reactive to light.  Cardiovascular:     Rate and Rhythm: Normal rate and regular rhythm.  Pulmonary:     Effort: Pulmonary effort is normal.     Breath sounds: Normal breath sounds.  Abdominal:     General: Abdomen is flat.     Palpations: Abdomen is soft.  Musculoskeletal:        General: Normal range of motion.  Skin:    General: Skin is warm and dry.  Neurological:     General: No focal deficit present.     Mental Status: He is alert. Mental status is at baseline.  Psychiatric:        Attention and Perception: He is inattentive.        Mood and Affect: Affect is blunt.        Speech: He is noncommunicative.        Behavior: Behavior is withdrawn.        Cognition and Memory: Cognition is impaired.        Judgment: Judgment is inappropriate.    Review of Systems  Unable to perform ROS: Psychiatric disorder   Blood pressure 114/77, pulse 65, temperature 97.7 F (36.5 C), temperature source Oral, resp. rate 18, height 5\' 11"  (1.803 m), weight 66.2 kg, SpO2 100 %. Body mass index is 20.36 kg/m.  Treatment Plan Summary: Medication management and Plan change medicine to oral Invega and Ativan.  Hope that we can switch to long-acting injectable.  Continue every 15 minute checks.  Check hemoglobin A1c and lipid panel if not already done.  Observation Level/Precautions:  15 minute checks  Laboratory:  Chemistry Profile  Psychotherapy:    Medications:    Consultations:    Discharge Concerns:    Estimated LOS:  Other:     Physician Treatment Plan for Primary Diagnosis: Schizophrenia (HCC) Long Term Goal(s): Improvement in symptoms  so as ready for discharge  Short Term Goals: Ability to demonstrate self-control will improve and Compliance with prescribed medications will improve  Physician Treatment Plan for Secondary Diagnosis: Principal Problem:   Schizophrenia (HCC)  Long Term Goal(s): Improvement in symptoms so as ready for discharge  Short Term  Goals: Ability to maintain clinical measurements within normal limits will improve  I certify that inpatient services furnished can reasonably be expected to improve the patient's condition.    Mordecai Rasmussen, MD 4/26/20244:29 PM

## 2023-01-15 NOTE — [facility] Suicide Risk Assessment (Signed)
[facility] INPATIENT:  Family/Significant Other Suicide Prevention Education  Suicide Prevention Education:  Patient Refusal for Family/Significant Other Suicide Prevention Education: The patient Eric Pineda has refused to provide written consent for family/significant other to be provided Family/Significant Other Suicide Prevention Education during admission and/or prior to discharge.  Physician notified.  SPE completed with pt, as pt refused to consent to family contact. SPI pamphlet provided to pt and pt was encouraged to share information with support network, ask questions, and talk about any concerns relating to SPE. Pt denies access to guns/firearms and verbalized understanding of information provided. Mobile Crisis information also provided to pt.  Glenis Smoker 01/15/2023, 3:33 PM

## 2023-01-15 NOTE — BHH Counselor (Signed)
Adult Comprehensive Assessment  Patient ID: Eric Pineda, male   DOB: May 24, 1996, 27 y.o.   MRN: [id number]  Information Source: Information source: Patient  Current Stressors:  Patient states their primary concerns and needs for treatment are:: "Just a regular routine check up." Patient states their goals for this hospitilization and ongoing recovery are:: Pt expresses that he is here to get a routine check up. Educational / Learning stressors: Pt denies Employment / Job issues: Pt denies Family Relationships: Pt denies Surveyor, quantity / Lack of resources (include bankruptcy): Pt denies Housing / Lack of housing: Pt denies Physical health (include injuries & life threatening diseases): Pt denies Social relationships: Pt denies Substance abuse: Pt denies Bereavement / Loss: Previous shared that his "grandfather, passed away a few years ago."  Living/Environment/Situation:  Living Arrangements: Parent, Other relatives Living conditions (as described by patient or guardian): "It works." Who else lives in the home?: "My parents and my siblings." How long has patient lived in current situation?: Previously noted as "for a few months." Unable to assess at this point in time. What is atmosphere in current home: Comfortable, Supportive, Loving, Other (Comment) ("Good")  Family History:  Marital status: Single Are you sexually active?: No What is your sexual orientation?: Pt reports "heterosexual" Has your sexual activity been affected by drugs, alcohol, medication, or emotional stress?: N/A Does patient have children?: No  Childhood History:  By whom was/is the patient raised?: Both parents, Grandparents Description of patient's relationship with caregiver when they were a child: Pt reports "it was pretty good" Patient's description of current relationship with people who raised him/her: "Good: How were you disciplined when you got in trouble as a child/adolescent?: Pt reports  "whoopings" Does patient have siblings?: Yes Number of Siblings: 3 (Older brother and two younger sisters) Description of patient's current relationship with siblings: "Good" Did patient suffer any verbal/emotional/physical/sexual abuse as a child?: No Did patient suffer from severe childhood neglect?: Yes Patient description of severe childhood neglect: He reported that "both of my parents worked." Has patient ever been sexually abused/assaulted/raped as an adolescent or adult?: No Was the patient ever a victim of a crime or a disaster?: No Witnessed domestic violence?: No Has patient been affected by domestic violence as an adult?: No  Education:  Highest grade of school patient has completed: "Middle school" Currently a student?: No Learning disability?: Yes What learning problems does patient have?: Pt reports, "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?: No  Financial Resources:   Financial resources: Support from parents / caregiver Does patient have a Lawyer or guardian?: No  Alcohol/Substance Abuse:   What has been your use of drugs/alcohol within the last 12 months?: Pt denies any substance use in the past year. If attempted suicide, did drugs/alcohol play a role in this?: No Alcohol/Substance Abuse Treatment Hx: Denies past history If yes, describe treatment: N/A Has alcohol/substance abuse ever caused legal problems?: Yes (Previously reported, "I went to jail and got fined.")  Social Support System:   Patient's Community Support System:  (Unable to assess) Describe Community Support System: Unable to assess Type of faith/religion: Unable to assess How does patient's faith help to  cope with current illness?: Unable to assess  Leisure/Recreation:  Do You Have Hobbies?: Yes Leisure and Hobbies: "Walking and listening to music."  Strengths/Needs:   What is the patient's perception of their strengths?: "I read, walking, walking, walking." Patient states they can use these personal strengths during their treatment to contribute to their recovery: Pt denies Patient states these barriers may affect/interfere with their treatment: Pt denies Patient states these barriers may affect their return to the community: Pt denies Other important information patient would like considered in planning for their treatment: N/A  Discharge Plan:   Currently receiving community mental health services: No Patient states concerns and preferences for aftercare planning are: Pt is declining aftercare at this time. Patient states they will know when they are safe and ready for discharge when: Pt expresses that he is just here for a check up. Does patient have access to transportation?: Yes Does patient have financial barriers related to discharge medications?: Yes Patient description of barriers related to discharge medications: Chart indicates that pt does not have insurance. Will patient be returning to same living situation after discharge?: Yes  Summary/Recommendations:   Summary and Recommendations (to be completed by the evaluator): Patient is a 27 year old [place] [place] [facility] Chart indicates that patient has a legal guardian, however, per review of the patient is noted to not have a guardian. Pt presents to the hospital involuntary with law enforcement following a report of disruptive behaviors at home, combativeness, refusing to eat, medication non-compliance per family report.  He shared that he came into the hospital for "just a regular, routine check up," which was also expressed as his goal for hospitalization. Pt lives with his parents and siblings, where he  plans to return upon discharge. He declined aftercare appointment. He has a primary diagnosis of Schizophrenia. During interaction, pt was guarded but calm and cooperative.  Recommendations include: crisis stabilization, therapeutic milieu, encourage group attendance and participation, medication management for detox/mood stabilization and development of comprehensive mental wellness/sobriety plan.  Glenis Smoker. 01/15/2023

## 2023-01-15 NOTE — Progress Notes (Signed)
Patient is A+O x 2. He denies SI/HI/AVH. Affect flat. Mood preoccupied. Appetite good. Pain 0/10. Patient is mainly isolative to his room and needs encouragement to participate in activities and take medications. Patient will often state that he will be right back and then never return to engage/accept meds.  Zyprexa 5 mg dc'd. Ativan 1 mg every 6 hours, Cogentin 0.5 mg daily at bedtime, and Invega 9 mg daily at bedtime added to scheduled meds.  Q15 minute unit checks in place.

## 2023-01-15 NOTE — BH IP Treatment Plan (Signed)
Interdisciplinary Treatment and Diagnostic Plan Update  01/15/2023 Time of Session: 09:12 Eric Pineda MRN: [id number]  Principal Diagnosis: Schizoaffective disorder, bipolar type [facility])  Secondary Diagnoses: Principal Problem:   Schizoaffective disorder, bipolar type (HCC)   Current Medications:  Current Facility-Administered Medications  Medication Dose Route Frequency Provider Last Rate Last Admin   acetaminophen (TYLENOL) tablet 650 mg  650 mg Oral Q6H PRN Leevy-Johnson, Brooke A, NP       alum & mag hydroxide-simeth (MAALOX/MYLANTA) 200-200-20 MG/5ML suspension 30 mL  30 mL Oral Q4H PRN Leevy-Johnson, Brooke A, NP       diphenhydrAMINE (BENADRYL) capsule 50 mg  50 mg Oral Q6H PRN Leevy-Johnson, Brooke A, NP       Or   diphenhydrAMINE (BENADRYL) injection 50 mg  50 mg Intramuscular Q6H PRN Leevy-Johnson, Brooke A, NP       haloperidol (HALDOL) tablet 5 mg  5 mg Oral Q6H PRN Leevy-Johnson, Brooke A, NP       Or   haloperidol lactate (HALDOL) injection 5 mg  5 mg Intramuscular Q6H PRN Leevy-Johnson, Brooke A, NP       LORazepam (ATIVAN) tablet 2 mg  2 mg Oral Q6H PRN Leevy-Johnson, Brooke A, NP       Or   LORazepam (ATIVAN) injection 2 mg  2 mg Intramuscular Q6H PRN Leevy-Johnson, Brooke A, NP       magnesium hydroxide (MILK OF MAGNESIA) suspension 30 mL  30 mL Oral Daily PRN Leevy-Johnson, Brooke A, NP       OLANZapine zydis (ZYPREXA) disintegrating tablet 5 mg  5 mg Oral BID Leevy-Johnson, Brooke A, NP   5 mg at 01/15/23 0911   PTA Medications: Medications Prior to Admission  Medication Sig Dispense Refill Last Dose   LORazepam (ATIVAN) 0.5 MG tablet Take 1 tablet (0.5 mg total) by mouth 2 (two) times daily. (Patient not taking: Reported on 01/14/2023) 60 tablet 1    paliperidone (INVEGA SUSTENNA) 156 MG/ML SUSY injection Inject 1 mL (156 mg total) into the muscle every 28 (twenty-eight) days. (Patient not taking: Reported on 01/14/2023) 1.2 mL 1     Patient Stressors: Medication  change or noncompliance    Patient Strengths: Supportive family/friends   Treatment Modalities: Medication Management, Group therapy, Case management,  1 to 1 session with clinician, Psychoeducation, Recreational therapy.   Physician Treatment Plan for Primary Diagnosis: Schizoaffective disorder, bipolar type (HCC) Long Term Goal(s):     Short Term Goals:    Medication Management: Evaluate patient's response, side effects, and tolerance of medication regimen.  Therapeutic Interventions: 1 to 1 sessions, Unit Group sessions and Medication administration.  Evaluation of Outcomes: Not Met  Physician Treatment Plan for Secondary Diagnosis: Principal Problem:   Schizoaffective disorder, bipolar type (HCC)  Long Term Goal(s):     Short Term Goals:       Medication Management: Evaluate patient's response, side effects, and tolerance of medication regimen.  Therapeutic Interventions: 1 to 1 sessions, Unit Group sessions and Medication administration.  Evaluation of Outcomes: Not Met   RN Treatment Plan for Primary Diagnosis: Schizoaffective disorder, bipolar type (HCC) Long Term Goal(s): Knowledge of disease and therapeutic regimen to maintain health will improve  Short Term Goals: Ability to remain free from injury will improve, Ability to verbalize frustration and anger appropriately will improve, Ability to demonstrate self-control, Ability to participate in decision making will improve, Ability to verbalize feelings will improve, Ability to disclose and discuss suicidal ideas, Ability to identify and develop effective coping  behaviors will improve, and Compliance with prescribed medications will improve  Medication Management: RN will administer medications as ordered by provider, will assess and evaluate patient's response and provide education to patient for prescribed medication. RN will report any adverse and/or side effects to prescribing provider.  Therapeutic Interventions:  1 on 1 counseling sessions, Psychoeducation, Medication administration, Evaluate responses to treatment, Monitor vital signs and CBGs as ordered, Perform/monitor CIWA, COWS, AIMS and Fall Risk screenings as ordered, Perform wound care treatments as ordered.  Evaluation of Outcomes: Not Met   LCSW Treatment Plan for Primary Diagnosis: Schizoaffective disorder, bipolar type (HCC) Long Term Goal(s): Safe transition to appropriate next level of care at discharge, Engage patient in therapeutic group addressing interpersonal concerns.  Short Term Goals: Engage patient in aftercare planning with referrals and resources, Increase social support, Increase ability to appropriately verbalize feelings, Increase emotional regulation, Facilitate acceptance of mental health diagnosis and concerns, and Increase skills for wellness and recovery  Therapeutic Interventions: Assess for all discharge needs, 1 to 1 time with Social worker, Explore available resources and support systems, Assess for adequacy in community support network, Educate family and significant other(s) on suicide prevention, Complete Psychosocial Assessment, Interpersonal group therapy.  Evaluation of Outcomes: Not Met   Progress in Treatment: Attending groups: No. Participating in groups: No. Taking medication as prescribed: Yes. Toleration medication: Yes. Family/Significant other contact made: No, will contact:  if given permission. Patient understands diagnosis: No. Discussing patient identified problems/goals with staff: No. Medical problems stabilized or resolved: Yes. Denies suicidal/homicidal ideation: No. Issues/concerns per patient self-inventory: No. Other: none.  New problem(s) identified: No, Describe:  none identified.  New Short Term/Long Term Goal(s): elimination of symptoms of psychosis, medication management for mood stabilization; elimination of SI thoughts; development of comprehensive mental wellness  plan.   Patient Goals:  Pt declined to participate in the treatment team meeting. He came into the room and introduction were made. However, after being asked about his goal he stated that he had to use the bathroom and left the room without returning.  Discharge Plan or Barriers: CSW will assist pt with development of an appropriate aftercare/discharge plan.   Reason for Continuation of Hospitalization: Delusions  Hallucinations Medication stabilization  Estimated Length of Stay: 1-7 days  Last 3 [place] Suicide Severity Risk Score: Flowsheet Row Admission (Current) from [facility]14/2023 in [facility] INPATIENT BEHAVIORAL MEDICINE Most recent reading at 01/14/2023  6:15 PM ED from 01/14/2023 in [facility] Department at [facility] Most recent reading at 01/13/2023 10:33 PM Admission (Discharged) from 01/09/2021 in [facility] INPATIENT BEHAVIORAL MEDICINE Most recent reading at 01/09/2021  9:54 PM  C-SSRS RISK CATEGORY No Risk No Risk No Risk       Last PHQ 2/9 Scores:     No data to display          Scribe for Treatment Team: Glenis Smoker, LCSW 01/15/2023 10:07 AM

## 2023-01-15 NOTE — Group Note (Signed)
Recreation Therapy Group Note   Group Topic:Leisure Education  Group Date: 01/15/2023 Start Time: 1000 End Time: 1100 Facilitators: Rosina Lowenstein, LRT, CTRS Location:  Craft Room  Group Description: Leisure. Patients were given the option to choose from singing karaoke, make origami, playing with a deck of cards, or coloring mandalas. LRT and pts discussed the importance of participating in leisure during their free time and when they're outside of the hospital. Pt identified two leisure interests and shared with the group.   Goal Area(s) Addressed:  Patient will identify a current leisure interest.  Patient will practice making a positive decision. Patient will have the opportunity to try a new leisure activity.  Affect/Mood: N/A   Participation Level: Did not attend    Clinical Observations/Individualized Feedback: Eric Pineda did not attend group due to resting in his room.  Plan: Continue to engage patient in RT group sessions 2-3x/week.   Rosina Lowenstein, LRT, CTRS 01/15/2023 11:49 AM

## 2023-01-15 NOTE — BHH Counselor (Signed)
During meeting to complete assessment, pt declined outpatient follow up.   [name]. [name], MSW, LCSW, LCAS 01/15/2023 3:33 PM

## 2023-01-15 NOTE — Progress Notes (Signed)
Patient isolative to self and room. No interaction with peers. Minimal with staff. No meds due this shift. Slept well. Disorganized. Encouragement and support provided. Safety checks maintained. Fluid and nutrition offered. Patient remains safe on unit with q 15 min checks.

## 2023-01-15 NOTE — Plan of Care (Signed)
  Problem: Pain Managment: Goal: General experience of comfort will improve Outcome: Progressing   Problem: Safety: Goal: Ability to remain free from injury will improve Outcome: Progressing   

## 2023-01-16 DIAGNOSIS — F203 Undifferentiated schizophrenia: Secondary | ICD-10-CM | POA: Diagnosis not present

## 2023-01-16 MED ORDER — PALIPERIDONE PALMITATE ER 234 MG/1.5ML IM SUSY
234.0000 mg | PREFILLED_SYRINGE | Freq: Once | INTRAMUSCULAR | Status: AC
  Administered 2023-01-16: 234 mg via INTRAMUSCULAR
  Filled 2023-01-16: qty 1.5

## 2023-01-16 NOTE — Group Note (Signed)
Date:  01/16/2023 Time:  9:00 AM  Group Topic/Focus:  Goals Group:   The focus of this group is to help patients establish daily goals to achieve during treatment and discuss how the patient can incorporate goal setting into their daily lives to aide in recovery.  Community Group   Participation Level:  Did Not Attend  Eric Pineda 01/16/2023, 6:05 PM

## 2023-01-16 NOTE — Progress Notes (Signed)
Patient was cooperative on shift. He denies SI, HI & AVH. No new issues to report on shift at this time.

## 2023-01-16 NOTE — Progress Notes (Signed)
[facility] MD Progress Note  01/16/2023 1:39 PM Eric Pineda  MRN:  [id number] Subjective: Patient seen for follow-up.  No reports of specific behavior problems.  Cooperative with medicine.  Denies acute hallucinations or psychosis.  Seems blunted and somewhat withdrawn still. Principal Problem: Schizophrenia (HCC) Diagnosis: Principal Problem:   Schizophrenia (HCC)  Total Time spent with patient: 30 minutes  Past Psychiatric History: Past history of schizophrenia  Past Medical History:  Past Medical History:  Diagnosis Date   Bizarre behavior 12/02/2019   History reviewed. No pertinent surgical history. Family History: History reviewed. No pertinent family history. Family Psychiatric  History: See previous Social History:  Social History   Substance and Sexual Activity  Alcohol Use None     Social History   Substance and Sexual Activity  Drug Use Not on file    Social History   Socioeconomic History   Marital status: Single    Spouse name: Not on file   Number of children: Not on file   Years of education: Not on file   Highest education level: Not on file  Occupational History   Not on file  Tobacco Use   Smoking status: Light Smoker   Smokeless tobacco: Never   Tobacco comments:    Will not specify how much he smokes  Substance and Sexual Activity   Alcohol use: Not on file   Drug use: Not on file   Sexual activity: Not on file  Other Topics Concern   Not on file  Social History Narrative   Not on file   Social Determinants of Health   Financial Resource Strain: Not on file  Food Insecurity: No Food Insecurity (01/14/2023)   Hunger Vital Sign    Worried About Running Out of Food in the Last Year: Never true    Ran Out of Food in the Last Year: Never true  Transportation Needs: No Transportation Needs (01/14/2023)   PRAPARE - Administrator, Civil Service (Medical): No    Lack of Transportation (Non-Medical): No  Physical Activity: Not on file   Stress: Not on file  Social Connections: Not on file   Additional Social History:                         Sleep: Fair  Appetite:  Fair  Current Medications: Current Facility-Administered Medications  Medication Dose Route Frequency Provider Last Rate Last Admin   acetaminophen (TYLENOL) tablet 650 mg  650 mg Oral Q6H PRN Leevy-Johnson, Brooke A, NP       alum & mag hydroxide-simeth (MAALOX/MYLANTA) 200-200-20 MG/5ML suspension 30 mL  30 mL Oral Q4H PRN Leevy-Johnson, Brooke A, NP       benztropine (COGENTIN) tablet 0.5 mg  0.5 mg Oral QHS Ercole Georg T, MD   0.5 mg at 01/15/23 2122   diphenhydrAMINE (BENADRYL) capsule 50 mg  50 mg Oral Q6H PRN Leevy-Johnson, Brooke A, NP       Or   diphenhydrAMINE (BENADRYL) injection 50 mg  50 mg Intramuscular Q6H PRN Leevy-Johnson, Brooke A, NP       haloperidol (HALDOL) tablet 5 mg  5 mg Oral Q6H PRN Leevy-Johnson, Brooke A, NP       Or   haloperidol lactate (HALDOL) injection 5 mg  5 mg Intramuscular Q6H PRN Leevy-Johnson, Brooke A, NP       LORazepam (ATIVAN) tablet 2 mg  2 mg Oral Q6H PRN Leevy-Johnson, Lina Sar, NP  Or   LORazepam (ATIVAN) injection 2 mg  2 mg Intramuscular Q6H PRN Leevy-Johnson, Brooke A, NP       LORazepam (ATIVAN) tablet 1 mg  1 mg Oral Q6H Graylin Sperling T, MD   1 mg at 01/16/23 1116   magnesium hydroxide (MILK OF MAGNESIA) suspension 30 mL  30 mL Oral Daily PRN Leevy-Johnson, Brooke A, NP       paliperidone (INVEGA SUSTENNA) injection 234 mg  234 mg Intramuscular Once Fantasy Donald T, MD       paliperidone (INVEGA) 24 hr tablet 9 mg  9 mg Oral QHS Marsia Cino, Jackquline Denmark, MD   9 mg at 01/15/23 2122    Lab Results: No results found for this or any previous visit (from the past 48 hour(s)).  Blood Alcohol level:  Lab Results  Component Value Date   ETH <10 01/13/2023   ETH <10 01/08/2021    Metabolic Disorder Labs: Lab Results  Component Value Date   HGBA1C 5.1 12/03/2019   MPG 99.67 12/03/2019   No  results found for: "PROLACTIN" Lab Results  Component Value Date   CHOL 171 10/04/2020   TRIG 61 10/04/2020   HDL 55 10/04/2020   CHOLHDL 3.1 10/04/2020   VLDL 12 10/04/2020   LDLCALC 104 (H) 10/04/2020    Physical Findings: AIMS:  , ,  ,  ,    CIWA:    COWS:     Musculoskeletal: Strength & Muscle Tone: within normal limits Gait & Station: normal Patient leans: N/A  Psychiatric Specialty Exam:  Presentation  General Appearance:  Casual  Eye Contact: Minimal  Speech: Slow  Speech Volume: Decreased  Handedness: Right   Mood and Affect  Mood: Dysphoric  Affect: Flat; Restricted   Thought Process  Thought Processes: Disorganized  Descriptions of Associations:Circumstantial  Orientation:Partial  Thought Content:Illogical; Perseveration  History of Schizophrenia/Schizoaffective disorder:Yes  Duration of Psychotic Symptoms:No data recorded Hallucinations:No data recorded Ideas of Reference:None  Suicidal Thoughts:No data recorded Homicidal Thoughts:No data recorded  Sensorium  Memory: Immediate Poor; Recent Poor; Remote Poor  Judgment: Poor  Insight: Poor   Executive Functions  Concentration: Poor  Attention Span: Poor  Recall: Poor  Fund of Knowledge: Poor  Language: Poor   Psychomotor Activity  Psychomotor Activity:No data recorded  Assets  Assets: Social Support; Physical Health   Sleep  Sleep:No data recorded   Physical Exam: Physical Exam Vitals reviewed.  Constitutional:      Appearance: Normal appearance.  HENT:     Head: Normocephalic and atraumatic.     Mouth/Throat:     Pharynx: Oropharynx is clear.  Eyes:     Pupils: Pupils are equal, round, and reactive to light.  Cardiovascular:     Rate and Rhythm: Normal rate and regular rhythm.  Pulmonary:     Effort: Pulmonary effort is normal.     Breath sounds: Normal breath sounds.  Abdominal:     General: Abdomen is flat.     Palpations: Abdomen  is soft.  Musculoskeletal:        General: Normal range of motion.  Skin:    General: Skin is warm and dry.  Neurological:     General: No focal deficit present.     Mental Status: He is alert. Mental status is at baseline.  Psychiatric:        Attention and Perception: He is inattentive.        Mood and Affect: Mood normal. Affect is blunt.  Speech: Speech is delayed.        Behavior: Behavior is withdrawn.        Thought Content: Thought content normal.    Review of Systems  Constitutional: Negative.   HENT: Negative.    Eyes: Negative.   Respiratory: Negative.    Cardiovascular: Negative.   Gastrointestinal: Negative.   Musculoskeletal: Negative.   Skin: Negative.   Neurological: Negative.   Psychiatric/Behavioral: Negative.     Blood pressure 126/81, pulse 65, temperature 98.4 F (36.9 C), temperature source Oral, resp. rate 18, height 5\' 11"  (1.803 m), weight 66.2 kg, SpO2 99 %. Body mass index is 20.36 kg/m.   Treatment Plan Summary: Daily contact with patient to assess and evaluate symptoms and progress in treatment and Plan restart long-acting injectable with his agreement.  Ordered long-acting Invega 254 mg injection today.  Encourage patient to let us know about any other needs or concerns.  Mordecai Rasmussen, MD 01/16/2023, 1:39 PM

## 2023-01-16 NOTE — Progress Notes (Signed)
Patient observed laying down in bedroom and remains mostly to self. Patient minimally interactive on unit but med compliant and denies SI,HI, and A/V/H with no plan or intent.    01/16/23 1610  Psych Admission Type (Psych Patients Only)  Admission Status Involuntary  Psychosocial Assessment  Patient Complaints None  Eye Contact Fair  Facial Expression Flat  Affect Flat  Speech Logical/coherent  Interaction Guarded;Minimal  Motor Activity Other (Comment) (WDL)  Appearance/Hygiene In scrubs  Behavior Characteristics Cooperative  Mood Preoccupied  Thought Process  Coherency Blocking  Content Preoccupation  Delusions None reported or observed  Perception WDL  Hallucination None reported or observed  Judgment Impaired  Confusion None  Danger to Self  Current suicidal ideation? Denies  Agreement Not to Harm Self Yes  Description of Agreement verbal  Danger to Others  Danger to Others None reported or observed

## 2023-01-16 NOTE — Progress Notes (Signed)
   01/16/23 2200  Psych Admission Type (Psych Patients Only)  Admission Status Involuntary  Psychosocial Assessment  Patient Complaints None  Eye Contact Fair  Facial Expression Flat  Affect Flat  Speech Logical/coherent  Interaction Guarded;Isolative  Appearance/Hygiene In scrubs  Behavior Characteristics Cooperative  Mood Pleasant  Thought Process  Coherency WDL  Content Preoccupation  Delusions None reported or observed  Perception WDL  Hallucination None reported or observed  Judgment Limited  Confusion None  Danger to Self  Current suicidal ideation? Denies  Danger to Others  Danger to Others None reported or observed

## 2023-01-17 DIAGNOSIS — F203 Undifferentiated schizophrenia: Secondary | ICD-10-CM | POA: Diagnosis not present

## 2023-01-17 NOTE — Group Note (Signed)
LCSW Group Therapy Note   Group Date: 01/17/2023 Start Time: 1300 End Time: 1330   Type of Therapy and Topic:  Group Therapy: Stress Management  Participation Level:  Did Not Attend    Summary of Patient Progress:  The patient did not attend group.    Addley Ballinger S Qusai Kem, LCSWA 01/17/2023  2:53 PM    

## 2023-01-17 NOTE — Progress Notes (Signed)
Pt encouraged to drink more fluids.  Pt is alert.  Ativan dose held.

## 2023-01-17 NOTE — Progress Notes (Signed)
[facility] MD Progress Note  01/17/2023 11:59 AM Eric Pineda  MRN:  [id number] Subjective: Patient seen and chart reviewed.  Patient has not had any disruptive behavior.  He appears to be taking care of himself okay.  Patient does not speak very much often stays aloof but does come out into the day area does not appear to be frightened.  Makes minimal eye contact but answers questions briefly.  Denies suicidal ideation and denies hallucinations Principal Problem: Schizophrenia (HCC) Diagnosis: Principal Problem:   Schizophrenia (HCC)  Total Time spent with patient: 30 minutes  Past Psychiatric History: Past history of schizophrenia  Past Medical History:  Past Medical History:  Diagnosis Date   Bizarre behavior 12/02/2019   History reviewed. No pertinent surgical history. Family History: History reviewed. No pertinent family history. Family Psychiatric  History: See previous Social History:  Social History   Substance and Sexual Activity  Alcohol Use None     Social History   Substance and Sexual Activity  Drug Use Not on file    Social History   Socioeconomic History   Marital status: Single    Spouse name: Not on file   Number of children: Not on file   Years of education: Not on file   Highest education level: Not on file  Occupational History   Not on file  Tobacco Use   Smoking status: Light Smoker   Smokeless tobacco: Never   Tobacco comments:    Will not specify how much he smokes  Substance and Sexual Activity   Alcohol use: Not on file   Drug use: Not on file   Sexual activity: Not on file  Other Topics Concern   Not on file  Social History Narrative   Not on file   Social Determinants of Health   Financial Resource Strain: Not on file  Food Insecurity: No Food Insecurity (01/14/2023)   Hunger Vital Sign    Worried About Running Out of Food in the Last Year: Never true    Ran Out of Food in the Last Year: Never true  Transportation Needs: No  Transportation Needs (01/14/2023)   PRAPARE - Administrator, Civil Service (Medical): No    Lack of Transportation (Non-Medical): No  Physical Activity: Not on file  Stress: Not on file  Social Connections: Not on file   Additional Social History:                         Sleep: Fair  Appetite:  Fair  Current Medications: Current Facility-Administered Medications  Medication Dose Route Frequency Provider Last Rate Last Admin   acetaminophen (TYLENOL) tablet 650 mg  650 mg Oral Q6H PRN Leevy-Johnson, Brooke A, NP       alum & mag hydroxide-simeth (MAALOX/MYLANTA) 200-200-20 MG/5ML suspension 30 mL  30 mL Oral Q4H PRN Leevy-Johnson, Brooke A, NP       benztropine (COGENTIN) tablet 0.5 mg  0.5 mg Oral QHS Ilyssa Grennan T, MD   0.5 mg at 01/16/23 2232   diphenhydrAMINE (BENADRYL) capsule 50 mg  50 mg Oral Q6H PRN Leevy-Johnson, Brooke A, NP       Or   diphenhydrAMINE (BENADRYL) injection 50 mg  50 mg Intramuscular Q6H PRN Leevy-Johnson, Brooke A, NP       haloperidol (HALDOL) tablet 5 mg  5 mg Oral Q6H PRN Leevy-Johnson, Brooke A, NP       Or   haloperidol lactate (HALDOL) injection 5 mg  5 mg Intramuscular Q6H PRN Leevy-Johnson, Brooke A, NP       LORazepam (ATIVAN) tablet 2 mg  2 mg Oral Q6H PRN Leevy-Johnson, Brooke A, NP       Or   LORazepam (ATIVAN) injection 2 mg  2 mg Intramuscular Q6H PRN Leevy-Johnson, Brooke A, NP       LORazepam (ATIVAN) tablet 1 mg  1 mg Oral Q6H Kandance Yano T, MD   1 mg at 01/16/23 2232   magnesium hydroxide (MILK OF MAGNESIA) suspension 30 mL  30 mL Oral Daily PRN Leevy-Johnson, Brooke A, NP       paliperidone (INVEGA) 24 hr tablet 9 mg  9 mg Oral QHS Tanush Drees T, MD   9 mg at 01/16/23 2231    Lab Results: No results found for this or any previous visit (from the past 48 hour(s)).  Blood Alcohol level:  Lab Results  Component Value Date   ETH <10 01/13/2023   ETH <10 01/08/2021    Metabolic Disorder Labs: Lab Results   Component Value Date   HGBA1C 5.1 12/03/2019   MPG 99.67 12/03/2019   No results found for: "PROLACTIN" Lab Results  Component Value Date   CHOL 171 10/04/2020   TRIG 61 10/04/2020   HDL 55 10/04/2020   CHOLHDL 3.1 10/04/2020   VLDL 12 10/04/2020   LDLCALC 104 (H) 10/04/2020    Physical Findings: AIMS:  , ,  ,  ,    CIWA:    COWS:     Musculoskeletal: Strength & Muscle Tone: within normal limits Gait & Station: normal Patient leans: N/A  Psychiatric Specialty Exam:  Presentation  General Appearance:  Casual  Eye Contact: Minimal  Speech: Slow  Speech Volume: Decreased  Handedness: Right   Mood and Affect  Mood: Dysphoric  Affect: Flat; Restricted   Thought Process  Thought Processes: Disorganized  Descriptions of Associations:Circumstantial  Orientation:Partial  Thought Content:Illogical; Perseveration  History of Schizophrenia/Schizoaffective disorder:Yes  Duration of Psychotic Symptoms:No data recorded Hallucinations:No data recorded Ideas of Reference:None  Suicidal Thoughts:No data recorded Homicidal Thoughts:No data recorded  Sensorium  Memory: Immediate Poor; Recent Poor; Remote Poor  Judgment: Poor  Insight: Poor   Executive Functions  Concentration: Poor  Attention Span: Poor  Recall: Poor  Fund of Knowledge: Poor  Language: Poor   Psychomotor Activity  Psychomotor Activity:No data recorded  Assets  Assets: Social Support; Physical Health   Sleep  Sleep:No data recorded   Physical Exam: Physical Exam Vitals reviewed.  Constitutional:      Appearance: Normal appearance.  HENT:     Head: Normocephalic and atraumatic.     Mouth/Throat:     Pharynx: Oropharynx is clear.  Eyes:     Pupils: Pupils are equal, round, and reactive to light.  Cardiovascular:     Rate and Rhythm: Normal rate and regular rhythm.  Pulmonary:     Effort: Pulmonary effort is normal.     Breath sounds: Normal  breath sounds.  Abdominal:     General: Abdomen is flat.     Palpations: Abdomen is soft.  Musculoskeletal:        General: Normal range of motion.  Skin:    General: Skin is warm and dry.  Neurological:     General: No focal deficit present.     Mental Status: He is alert. Mental status is at baseline.  Psychiatric:        Attention and Perception: He is inattentive.  Mood and Affect: Mood normal. Affect is blunt.        Speech: Speech is delayed.        Behavior: Behavior is slowed.        Thought Content: Thought content normal.    Review of Systems  Constitutional: Negative.   HENT: Negative.    Eyes: Negative.   Respiratory: Negative.    Cardiovascular: Negative.   Gastrointestinal: Negative.   Musculoskeletal: Negative.   Skin: Negative.   Neurological: Negative.   Psychiatric/Behavioral: Negative.     Blood pressure 118/76, pulse 86, temperature 97.9 F (36.6 C), temperature source Oral, resp. rate 18, height 5\' 11"  (1.803 m), weight 66.2 kg, SpO2 100 %. Body mass index is 20.36 kg/m.   Treatment Plan Summary: Medication management and Plan no change to medication management.  He got his long-acting injectable shot.  Mordecai Rasmussen, MD 01/17/2023, 11:59 AM

## 2023-01-17 NOTE — Progress Notes (Signed)
D- Patient alert and oriented. Denies SI, HI, AVH, and pain. Pt unable to identify goals at this time.  When asked about his goals pt stated "none really." A- Scheduled medications administered to patient, per MD orders. Support and encouragement provided.  Routine safety checks conducted every 15 minutes.  Patient informed to notify staff with problems or concerns. R- No adverse drug reactions noted. Patient contracts for safety at this time. Patient compliant with medications and treatment plan. Patient receptive, calm, and cooperative. Patient interacts well with others on the unit.  Patient remains safe at this time.        01/17/23 0900  Psych Admission Type (Psych Patients Only)  Admission Status Involuntary  Psychosocial Assessment  Patient Complaints None  Eye Contact Poor  Facial Expression Flat  Affect Flat  Speech Logical/coherent  Interaction Isolative  Appearance/Hygiene In scrubs  Behavior Characteristics Cooperative  Mood Pleasant  Thought Process  Coherency WDL  Content Ambivalence  Delusions None reported or observed  Perception WDL  Hallucination None reported or observed  Judgment Limited  Confusion None  Danger to Self  Current suicidal ideation? Denies  Agreement Not to Harm Self Yes  Description of Agreement verbal  Danger to Others  Danger to Others None reported or observed

## 2023-01-18 DIAGNOSIS — F203 Undifferentiated schizophrenia: Secondary | ICD-10-CM | POA: Diagnosis not present

## 2023-01-18 LAB — LIPID PANEL
Cholesterol: 159 mg/dL (ref 0–200)
HDL: 57 mg/dL (ref >40)
LDL Cholesterol: 81 mg/dL (ref 0–99)
Total CHOL/HDL Ratio: 2.8 RATIO
Triglycerides: 103 mg/dL (ref <150)
VLDL: 21 mg/dL (ref 0–40)

## 2023-01-18 LAB — HEMOGLOBIN A1C
Hgb A1c MFr Bld: 5 % (ref 4.8–5.6)
Mean Plasma Glucose: 96.8 mg/dL

## 2023-01-18 NOTE — Progress Notes (Signed)
Pt denies SI/HI/AVH and verbally agrees to approach staff if these become apparent or before harming themselves/others. Rates depression 0/10. Rates anxiety 0/10. Rates pain 0/10.  Pt will pace around the unit. Pt will side-eye when walking by. Pt has turned around when seeing RN walk by. Pt seems to have gotten better throughout the day. Pt has been out of his room for most of the day but does not interact much with others. Scheduled medications administered to pt, per MD orders. RN provided support and encouragement to pt. Q15 min safety checks implemented and continued. Pt is safe on the unit. Plan of care on going and no other concerns expressed at this time.  01/18/23 0800  Psych Admission Type (Psych Patients Only)  Admission Status Involuntary  Psychosocial Assessment  Patient Complaints None  Eye Contact Watchful  Facial Expression Flat  Affect Flat  Speech Logical/coherent;Soft  Interaction Forwards little;Guarded;Minimal  Motor Activity Slow  Appearance/Hygiene In scrubs;Unremarkable  Behavior Characteristics Cooperative;Appropriate to situation;Calm;Guarded  Mood Pleasant  Aggressive Behavior  Effect No apparent injury  Thought Process  Coherency WDL  Content Paranoia  Delusions Paranoid  Perception WDL  Hallucination None reported or observed  Judgment Limited  Confusion None  Danger to Self  Current suicidal ideation? Denies  Danger to Others  Danger to Others None reported or observed

## 2023-01-18 NOTE — Progress Notes (Signed)
Patient calm and pleasant during assessment denying SI/HI/AVH. Pt observed interacting appropriately with staff and peers on the unit. Pt stated he would come take his scheduled medications but never came up the medication room. Pt given education, still refused. Pt given education, support, and encouragement to be active in his treatment plan. Pt being monitored Q 15 minutes for safety per unit protocol, remains safe on the unit

## 2023-01-18 NOTE — Group Note (Signed)
[facility] LCSW Group Therapy Note    Group Date: 01/18/2023 Start Time: 1300 End Time: 1400  Type of Therapy and Topic:  Group Therapy:  Overcoming Obstacles  Participation Level:  [facility] PARTICIPATION LEVEL: Did Not Attend  Mood:  Description of Group:   In this group patients will be encouraged to explore what they see as obstacles to their own wellness and recovery. They will be guided to discuss their thoughts, feelings, and behaviors related to these obstacles. The group will process together ways to cope with barriers, with attention given to specific choices patients can make. Each patient will be challenged to identify changes they are motivated to make in order to overcome their obstacles. This group will be process-oriented, with patients participating in exploration of their own experiences as well as giving and receiving support and challenge from other group members.  Therapeutic Goals: 1. Patient will identify personal and current obstacles as they relate to admission. 2. Patient will identify barriers that currently interfere with their wellness or overcoming obstacles.  3. Patient will identify feelings, thought process and behaviors related to these barriers. 4. Patient will identify two changes they are willing to make to overcome these obstacles:    Summary of Patient Progress Patient did not attend group despite encouraged participation.     Therapeutic Modalities:   Cognitive Behavioral Therapy Solution Focused Therapy Motivational Interviewing Relapse Prevention Therapy   Lenette Rau W Brewer Hitchman, LCSWA 

## 2023-01-18 NOTE — Group Note (Signed)
Recreation Therapy Group Note   Group Topic:Emotion Expression  Group Date: 01/18/2023 Start Time: 1000 End Time: 1040 Facilitators: Rosina Lowenstein, LRT, CTRS Location:  Craft Room  Group Description: Gratitude Journaling. Patients and LRT discussed what gratitude means, how we can express it and what it means to [place], personally. LRT gave an education handout on the definition of gratitude that also gave different examples of gratitude exercises that they could try. One of the examples was "Gratitude Letter", which prompted you to write a letter to someone you appreciate. LRT played soft music while everyone wrote their letter. Once letter was completed, LRT encouraged people to read their letter, if they wanted to, or share who they wrote it to, at minimum. LRT and pts processed how showing gratitude towards themselves, and others can be applied to everyday life post-discharge.   Goal Area(s) Addressed:  Patient will identify the definition of gratitude. Patient will learn different gratitude exercises. Patient will practice writing/journaling as a coping skill.   Affect/Mood: N/A   Participation Level: Did not attend    Clinical Observations/Individualized Feedback: Eric Pineda did not attend group due to resting in his room.   Plan: Continue to engage patient in RT group sessions 2-3x/week.   Rosina Lowenstein, LRT, CTRS 01/18/2023 11:08 AM

## 2023-01-18 NOTE — Progress Notes (Signed)
[facility] MD Progress Note  01/18/2023 10:59 AM Eric Pineda  MRN:  [id number] Subjective: Patient seen and chart reviewed.  27 year old man with schizophrenia.  Appears to be doing better today.  He is able to sit down and hold a conversation keeping eye contact and answering questions appropriately.  Self-care is good.  Tolerating medication. Principal Problem: Schizophrenia (HCC) Diagnosis: Principal Problem:   Schizophrenia (HCC)  Total Time spent with patient: 30 minutes  Past Psychiatric History: Past history of schizophrenia and has done well on long-acting injectables in the past when he is compliant  Past Medical History:  Past Medical History:  Diagnosis Date   Bizarre behavior 12/02/2019   History reviewed. No pertinent surgical history. Family History: History reviewed. No pertinent family history. Family Psychiatric  History: See previous Social History:  Social History   Substance and Sexual Activity  Alcohol Use None     Social History   Substance and Sexual Activity  Drug Use Not on file    Social History   Socioeconomic History   Marital status: Single    Spouse name: Not on file   Number of children: Not on file   Years of education: Not on file   Highest education level: Not on file  Occupational History   Not on file  Tobacco Use   Smoking status: Light Smoker   Smokeless tobacco: Never   Tobacco comments:    Will not specify how much he smokes  Substance and Sexual Activity   Alcohol use: Not on file   Drug use: Not on file   Sexual activity: Not on file  Other Topics Concern   Not on file  Social History Narrative   Not on file   Social Determinants of Health   Financial Resource Strain: Not on file  Food Insecurity: No Food Insecurity (01/14/2023)   Hunger Vital Sign    Worried About Running Out of Food in the Last Year: Never true    Ran Out of Food in the Last Year: Never true  Transportation Needs: No Transportation Needs (01/14/2023)    PRAPARE - Administrator, Civil Service (Medical): No    Lack of Transportation (Non-Medical): No  Physical Activity: Not on file  Stress: Not on file  Social Connections: Not on file   Additional Social History:                         Sleep: Fair  Appetite:  Fair  Current Medications: Current Facility-Administered Medications  Medication Dose Route Frequency Provider Last Rate Last Admin   acetaminophen (TYLENOL) tablet 650 mg  650 mg Oral Q6H PRN Leevy-Johnson, Brooke A, NP       alum & mag hydroxide-simeth (MAALOX/MYLANTA) 200-200-20 MG/5ML suspension 30 mL  30 mL Oral Q4H PRN Leevy-Johnson, Brooke A, NP       benztropine (COGENTIN) tablet 0.5 mg  0.5 mg Oral QHS Ksenia Kunz T, MD   0.5 mg at 01/16/23 2232   diphenhydrAMINE (BENADRYL) capsule 50 mg  50 mg Oral Q6H PRN Leevy-Johnson, Brooke A, NP       Or   diphenhydrAMINE (BENADRYL) injection 50 mg  50 mg Intramuscular Q6H PRN Leevy-Johnson, Brooke A, NP       haloperidol (HALDOL) tablet 5 mg  5 mg Oral Q6H PRN Leevy-Johnson, Brooke A, NP       Or   haloperidol lactate (HALDOL) injection 5 mg  5 mg Intramuscular Q6H PRN Leevy-Johnson,  Brooke A, NP       LORazepam (ATIVAN) tablet 2 mg  2 mg Oral Q6H PRN Leevy-Johnson, Brooke A, NP       Or   LORazepam (ATIVAN) injection 2 mg  2 mg Intramuscular Q6H PRN Leevy-Johnson, Brooke A, NP       LORazepam (ATIVAN) tablet 1 mg  1 mg Oral Q6H Amazing Cowman T, MD   1 mg at 01/17/23 1747   magnesium hydroxide (MILK OF MAGNESIA) suspension 30 mL  30 mL Oral Daily PRN Leevy-Johnson, Brooke A, NP       paliperidone (INVEGA) 24 hr tablet 9 mg  9 mg Oral QHS Derell Bruun T, MD   9 mg at 01/16/23 2231    Lab Results: No results found for this or any previous visit (from the past 48 hour(s)).  Blood Alcohol level:  Lab Results  Component Value Date   ETH <10 01/13/2023   ETH <10 01/08/2021    Metabolic Disorder Labs: Lab Results  Component Value Date   HGBA1C 5.1  12/03/2019   MPG 99.67 12/03/2019   No results found for: "PROLACTIN" Lab Results  Component Value Date   CHOL 171 10/04/2020   TRIG 61 10/04/2020   HDL 55 10/04/2020   CHOLHDL 3.1 10/04/2020   VLDL 12 10/04/2020   LDLCALC 104 (H) 10/04/2020    Physical Findings: AIMS: Facial and Oral Movements Muscles of Facial Expression: None, normal Lips and Perioral Area: None, normal Jaw: None, normal Tongue: None, normal,Extremity Movements Upper (arms, wrists, hands, fingers): None, normal Lower (legs, knees, ankles, toes): None, normal, Trunk Movements Neck, shoulders, hips: None, normal, Overall Severity Severity of abnormal movements (highest score from questions above): None, normal Incapacitation due to abnormal movements: None, normal Patient's awareness of abnormal movements (rate only patient's report): No Awareness, Dental Status Current problems with teeth and/or dentures?: No Does patient usually wear dentures?: No  CIWA:    COWS:     Musculoskeletal: Strength & Muscle Tone: within normal limits Gait & Station: normal Patient leans: N/A  Psychiatric Specialty Exam:  Presentation  General Appearance:  Casual  Eye Contact: Minimal  Speech: Slow  Speech Volume: Decreased  Handedness: Right   Mood and Affect  Mood: Dysphoric  Affect: Flat; Restricted   Thought Process  Thought Processes: Disorganized  Descriptions of Associations:Circumstantial  Orientation:Partial  Thought Content:Illogical; Perseveration  History of Schizophrenia/Schizoaffective disorder:Yes  Duration of Psychotic Symptoms:No data recorded Hallucinations:No data recorded Ideas of Reference:None  Suicidal Thoughts:No data recorded Homicidal Thoughts:No data recorded  Sensorium  Memory: Immediate Poor; Recent Poor; Remote Poor  Judgment: Poor  Insight: Poor   Executive Functions  Concentration: Poor  Attention Span: Poor  Recall: Poor  Fund of  Knowledge: Poor  Language: Poor   Psychomotor Activity  Psychomotor Activity:No data recorded  Assets  Assets: Social Support; Physical Health   Sleep  Sleep:No data recorded   Physical Exam: Physical Exam Vitals and nursing note reviewed.  Constitutional:      Appearance: Normal appearance.  HENT:     Head: Normocephalic and atraumatic.     Mouth/Throat:     Pharynx: Oropharynx is clear.  Eyes:     Pupils: Pupils are equal, round, and reactive to light.  Cardiovascular:     Rate and Rhythm: Normal rate and regular rhythm.  Pulmonary:     Effort: Pulmonary effort is normal.     Breath sounds: Normal breath sounds.  Abdominal:     General: Abdomen is flat.  Palpations: Abdomen is soft.  Musculoskeletal:        General: Normal range of motion.  Skin:    General: Skin is warm and dry.  Neurological:     General: No focal deficit present.     Mental Status: He is alert. Mental status is at baseline.  Psychiatric:        Attention and Perception: Attention normal.        Mood and Affect: Mood normal. Affect is blunt.        Speech: Speech is delayed.        Behavior: Behavior is slowed.        Thought Content: Thought content normal.    Review of Systems  Constitutional: Negative.   HENT: Negative.    Eyes: Negative.   Respiratory: Negative.    Cardiovascular: Negative.   Gastrointestinal: Negative.   Musculoskeletal: Negative.   Skin: Negative.   Neurological: Negative.   Psychiatric/Behavioral: Negative.     Blood pressure 103/72, pulse 61, temperature 98 F (36.7 C), temperature source Oral, resp. rate 18, height 5\' 11"  (1.803 m), weight 66.2 kg, SpO2 98 %. Body mass index is 20.36 kg/m.   Treatment Plan Summary: Medication management and Plan no change to medication management.  It has only been a couple of days since his first [place] shot.  Probably need to wait at least 3 more before the second 1.  Hope we can discharge him by the end of the  week.  Mordecai Rasmussen, MD 01/18/2023, 10:59 AM

## 2023-01-19 DIAGNOSIS — F203 Undifferentiated schizophrenia: Secondary | ICD-10-CM | POA: Diagnosis not present

## 2023-01-19 NOTE — Plan of Care (Signed)
  Problem: Education: Goal: Knowledge of General Education information will improve Description: Including pain rating scale, medication(s)/side effects and non-pharmacologic comfort measures Outcome: Progressing   Problem: Activity: Goal: Risk for activity intolerance will decrease Outcome: Progressing   Problem: Nutrition: Goal: Adequate nutrition will be maintained Outcome: Progressing   Problem: Coping: Goal: Level of anxiety will decrease Outcome: Progressing   

## 2023-01-19 NOTE — Progress Notes (Signed)
[facility] MD Progress Note  01/19/2023 12:00 PM Eric Pineda  MRN:  [id number] Subjective: Follow-up 27 year old man with schizophrenia.  Continues to look improved compared to admission.  Not showing any catatonic like symptoms.  Able to sit down in a room and hold a appropriate conversation without any obvious psychotic behavior or symptoms.  Patient acknowledges that he has not actively followed up with outpatient providers in the past but has been willing to take medicine in the hospital.  No sign of any side effects Principal Problem: Schizophrenia (HCC) Diagnosis: Principal Problem:   Schizophrenia (HCC)  Total Time spent with patient: 30 minutes  Past Psychiatric History: Past history of schizophrenia several prior admissions  Past Medical History:  Past Medical History:  Diagnosis Date   Bizarre behavior 12/02/2019   History reviewed. No pertinent surgical history. Family History: History reviewed. No pertinent family history. Family Psychiatric  History: See previous Social History:  Social History   Substance and Sexual Activity  Alcohol Use None     Social History   Substance and Sexual Activity  Drug Use Not on file    Social History   Socioeconomic History   Marital status: Single    Spouse name: Not on file   Number of children: Not on file   Years of education: Not on file   Highest education level: Not on file  Occupational History   Not on file  Tobacco Use   Smoking status: Light Smoker   Smokeless tobacco: Never   Tobacco comments:    Will not specify how much he smokes  Substance and Sexual Activity   Alcohol use: Not on file   Drug use: Not on file   Sexual activity: Not on file  Other Topics Concern   Not on file  Social History Narrative   Not on file   Social Determinants of Health   Financial Resource Strain: Not on file  Food Insecurity: No Food Insecurity (01/14/2023)   Hunger Vital Sign    Worried About Running Out of Food in the Last  Year: Never true    Ran Out of Food in the Last Year: Never true  Transportation Needs: No Transportation Needs (01/14/2023)   PRAPARE - Administrator, Civil Service (Medical): No    Lack of Transportation (Non-Medical): No  Physical Activity: Not on file  Stress: Not on file  Social Connections: Not on file   Additional Social History:                         Sleep: Fair  Appetite:  Fair  Current Medications: Current Facility-Administered Medications  Medication Dose Route Frequency Provider Last Rate Last Admin   acetaminophen (TYLENOL) tablet 650 mg  650 mg Oral Q6H PRN Leevy-Johnson, Brooke A, NP       alum & mag hydroxide-simeth (MAALOX/MYLANTA) 200-200-20 MG/5ML suspension 30 mL  30 mL Oral Q4H PRN Leevy-Johnson, Brooke A, NP       benztropine (COGENTIN) tablet 0.5 mg  0.5 mg Oral QHS Aarilyn Dye T, MD   0.5 mg at 01/18/23 2143   diphenhydrAMINE (BENADRYL) capsule 50 mg  50 mg Oral Q6H PRN Leevy-Johnson, Brooke A, NP       Or   diphenhydrAMINE (BENADRYL) injection 50 mg  50 mg Intramuscular Q6H PRN Leevy-Johnson, Brooke A, NP       haloperidol (HALDOL) tablet 5 mg  5 mg Oral Q6H PRN Leevy-Johnson, Lina Sar, NP  Or   haloperidol lactate (HALDOL) injection 5 mg  5 mg Intramuscular Q6H PRN Leevy-Johnson, Brooke A, NP       LORazepam (ATIVAN) tablet 2 mg  2 mg Oral Q6H PRN Leevy-Johnson, Brooke A, NP       Or   LORazepam (ATIVAN) injection 2 mg  2 mg Intramuscular Q6H PRN Leevy-Johnson, Brooke A, NP       LORazepam (ATIVAN) tablet 1 mg  1 mg Oral Q6H Hallie Ishida T, MD   1 mg at 01/19/23 1151   magnesium hydroxide (MILK OF MAGNESIA) suspension 30 mL  30 mL Oral Daily PRN Leevy-Johnson, Brooke A, NP       paliperidone (INVEGA) 24 hr tablet 9 mg  9 mg Oral QHS Masiyah Engen, Jackquline Denmark, MD   9 mg at 01/18/23 2143    Lab Results:  Results for orders placed or performed during the hospital encounter of 01/14/23 (from the past 48 hour(s))  Lipid panel      Status: None   Collection Time: 01/18/23 10:30 AM  Result Value Ref Range   Cholesterol 159 0 - 200 mg/dL   Triglycerides 213 <086 mg/dL   HDL 57 >57 mg/dL   Total CHOL/HDL Ratio 2.8 RATIO   VLDL 21 0 - 40 mg/dL   LDL Cholesterol 81 0 - 99 mg/dL    Comment:        Total Cholesterol/HDL:CHD Risk Coronary Heart Disease Risk Table                     Men   Women  1/2 Average Risk   3.4   3.3  Average Risk       5.0   4.4  2 X Average Risk   9.6   7.1  3 X Average Risk  23.4   11.0        Use the calculated Patient Ratio above and the CHD Risk Table to determine the patient's CHD Risk.        ATP III CLASSIFICATION (LDL):  <100     mg/dL   Optimal  846-962  mg/dL   Near or Above                    Optimal  130-159  mg/dL   Borderline  952-841  mg/dL   High  >324     mg/dL   Very High Performed at [facility], [address] [place], [place] [address]   Hemoglobin A1c     Status: None   Collection Time: 01/18/23 10:30 AM  Result Value Ref Range   Hgb A1c MFr Bld 5.0 4.8 - 5.6 %    Comment: (NOTE) Pre diabetes:          5.7%-6.4%  Diabetes:              >6.4%  Glycemic control for   <7.0% adults with diabetes    Mean Plasma Glucose 96.8 mg/dL    Comment: Performed at [facility], [address], [place], [place] [address]    Blood Alcohol level:  Lab Results  Component Value Date   [facility] <10 01/13/2023   ETH <10 01/08/2021    Metabolic Disorder Labs: Lab Results  Component Value Date   HGBA1C 5.0 01/18/2023   MPG 96.8 01/18/2023   MPG 99.67 12/03/2019   No results found for: "PROLACTIN" Lab Results  Component Value Date   CHOL 159 01/18/2023   TRIG 103  01/18/2023   HDL 57 01/18/2023   CHOLHDL 2.8 01/18/2023   VLDL 21 01/18/2023   LDLCALC 81 01/18/2023   LDLCALC 104 (H) 10/04/2020    Physical Findings: AIMS: Facial and Oral Movements Muscles of Facial Expression: None, normal Lips and Perioral Area: None, normal Jaw: None,  normal Tongue: None, normal,Extremity Movements Upper (arms, wrists, hands, fingers): None, normal Lower (legs, knees, ankles, toes): None, normal, Trunk Movements Neck, shoulders, hips: None, normal, Overall Severity Severity of abnormal movements (highest score from questions above): None, normal Incapacitation due to abnormal movements: None, normal Patient's awareness of abnormal movements (rate only patient's report): No Awareness, Dental Status Current problems with teeth and/or dentures?: No Does patient usually wear dentures?: No  CIWA:    COWS:     Musculoskeletal: Strength & Muscle Tone: within normal limits Gait & Station: normal Patient leans: N/A  Psychiatric Specialty Exam:  Presentation  General Appearance:  Casual  Eye Contact: Minimal  Speech: Slow  Speech Volume: Decreased  Handedness: Right   Mood and Affect  Mood: Dysphoric  Affect: Flat; Restricted   Thought Process  Thought Processes: Disorganized  Descriptions of Associations:Circumstantial  Orientation:Partial  Thought Content:Illogical; Perseveration  History of Schizophrenia/Schizoaffective disorder:Yes  Duration of Psychotic Symptoms:No data recorded Hallucinations:No data recorded Ideas of Reference:None  Suicidal Thoughts:No data recorded Homicidal Thoughts:No data recorded  Sensorium  Memory: Immediate Poor; Recent Poor; Remote Poor  Judgment: Poor  Insight: Poor   Executive Functions  Concentration: Poor  Attention Span: Poor  Recall: Poor  Fund of Knowledge: Poor  Language: Poor   Psychomotor Activity  Psychomotor Activity:No data recorded  Assets  Assets: Social Support; Physical Health   Sleep  Sleep:No data recorded   Physical Exam: Physical Exam Vitals and nursing note reviewed.  Constitutional:      Appearance: Normal appearance.  HENT:     Head: Normocephalic and atraumatic.     Mouth/Throat:     Pharynx: Oropharynx is  clear.  Eyes:     Pupils: Pupils are equal, round, and reactive to light.  Cardiovascular:     Rate and Rhythm: Normal rate and regular rhythm.  Pulmonary:     Effort: Pulmonary effort is normal.     Breath sounds: Normal breath sounds.  Abdominal:     General: Abdomen is flat.     Palpations: Abdomen is soft.  Musculoskeletal:        General: Normal range of motion.  Skin:    General: Skin is warm and dry.  Neurological:     General: No focal deficit present.     Mental Status: He is alert. Mental status is at baseline.  Psychiatric:        Attention and Perception: Attention normal.        Mood and Affect: Mood normal.        Speech: Speech normal.        Behavior: Behavior normal.        Thought Content: Thought content normal.        Cognition and Memory: Cognition normal.        Judgment: Judgment normal.    Review of Systems  Constitutional: Negative.   HENT: Negative.    Eyes: Negative.   Respiratory: Negative.    Cardiovascular: Negative.   Gastrointestinal: Negative.   Musculoskeletal: Negative.   Skin: Negative.   Neurological: Negative.   Psychiatric/Behavioral: Negative.     Blood pressure 103/61, pulse (!) 54, temperature 98 F (36.7 C), temperature source Oral,  resp. rate 18, height 5\' 11"  (1.803 m), weight 66.2 kg, SpO2 98 %. Body mass index is 20.36 kg/m.   Treatment Plan Summary: Plan patient just got his first injection a few days ago.  I had like to wait probably another 2 days before giving him the second Invega shot since I doubt he will take the oral after discharge.  Patient understands the plan.  Urged him to continue his current good self-care and participation on the unit.  Did a little bit of psychoeducation to encourage him to take outpatient treatment more seriously.  Eric Rasmussen, MD 01/19/2023, 12:00 PM

## 2023-01-19 NOTE — Progress Notes (Signed)
Pt denies SI/HI/AVH and verbally agrees to approach staff if these become apparent or before harming themselves/others. Rates depression 0/10. Rates anxiety 0/10. Rates pain 0/10.  Pt has been in the dayroom for a lot of the day. Pt asked about when his discharge date was. Pt asked about shaving and told that we could try later. Pt then proceeded to ask a male MHT if he could. Scheduled medications administered to pt, per MD orders. RN provided support and encouragement to pt. Q15 min safety checks implemented and continued. Pt is safe on the unit. Plan of care on going and no other concerns expressed at this time.  01/19/23 0900  Psych Admission Type (Psych Patients Only)  Admission Status Involuntary  Psychosocial Assessment  Patient Complaints None  Eye Contact Fair  Facial Expression Flat  Affect Flat  Speech Logical/coherent  Interaction Guarded;Isolative  Motor Activity Slow  Appearance/Hygiene Unremarkable;In scrubs  Behavior Characteristics Cooperative;Appropriate to situation;Calm  Mood Pleasant  Aggressive Behavior  Effect No apparent injury  Thought Process  Coherency WDL  Content WDL  Delusions None reported or observed  Perception WDL  Hallucination None reported or observed  Judgment Impaired  Confusion None  Danger to Self  Current suicidal ideation? Denies  Danger to Others  Danger to Others None reported or observed

## 2023-01-19 NOTE — Group Note (Signed)
Recreation Therapy Group Note   Group Topic:Coping Skills  Group Date: 01/19/2023 Start Time: 1000 End Time: 1035 Facilitators: Clinton Gallant, CTRS Location:  Craft Room  Group Description: Mind Map.  Patient was provided a blank template of a diagram with 32 blank boxes in a tiered system, branching from the center (similar to a bubble chart). LRT directed patients to label the middle of the diagram "Coping Skills". LRT and patients then came up with 8 different coping skills as examples. Pt were directed to record their coping skills in the 2nd tier boxes closest to the center.  Patients would then share their coping skills with the group as LRT wrote them out. LRT gave a handout of 100 different coping skills at the end of group.   Goal Area(s) Addressed: Patients will be able to define "coping skills". Patient will identify new coping skills.  Patient will identify new possible leisure interests.   Affect/Mood: N/A   Participation Level: Did not attend    Clinical Observations/Individualized Feedback: Eric Pineda originally came to group, however got up and said that he needed to use the restroom and that he would be back. Pt never returned to group.  Plan: Continue to engage patient in RT group sessions 2-3x/week.   Eric Pineda, LRT, CTRS 01/19/2023 11:05 AM

## 2023-01-19 NOTE — Group Note (Signed)
[facility] LCSW Group Therapy Note   Group Date: 01/19/2023 Start Time: 1300 End Time: 1325  Type of Therapy/Topic:  Group Therapy:  Feelings about Diagnosis  Participation Level:  Minimal    Description of Group:    This group will allow patients to explore their thoughts and feelings about diagnoses they have received. Patients will be guided to explore their level of understanding and acceptance of these diagnoses. Facilitator will encourage patients to process their thoughts and feelings about the reactions of others to their diagnosis, and will guide patients in identifying ways to discuss their diagnosis with significant others in their lives. This group will be process-oriented, with patients participating in exploration of their own experiences as well as giving and receiving support and challenge from other group members.   Therapeutic Goals: 1. Patient will demonstrate understanding of diagnosis as evidence by identifying two or more symptoms of the disorder:  2. Patient will be able to express two feelings regarding the diagnosis 3. Patient will demonstrate ability to communicate their needs through discussion and/or role plays  Summary of Patient Progress: Patient was present for the majority of the group process. He defined diagnosis as health. Pt shared that he feels good about his diagnosis, stating that all that he talks about with the provider is work. Outside of these few comments pt was not vocal during group. However, he did appear to attend to the conversation.    Therapeutic Modalities:   Cognitive Behavioral Therapy Brief Therapy Feelings Identification    Glenis Smoker, LCSW

## 2023-01-19 NOTE — Progress Notes (Signed)
Patient is quiet and reserved.  He is isolative to his room with minimal engagement with staff or peers.  He is med compliant at this encounter and took his meds without issue. He denies si/hi/avh anxiety and depression. Will continue to monitor with q15 minute safety checks.     C Butler-Nicholson, LPN

## 2023-01-20 ENCOUNTER — Other Ambulatory Visit: Payer: Self-pay

## 2023-01-20 DIAGNOSIS — F203 Undifferentiated schizophrenia: Secondary | ICD-10-CM | POA: Diagnosis not present

## 2023-01-20 MED ORDER — PALIPERIDONE PALMITATE ER 156 MG/ML IM SUSY
156.0000 mg | PREFILLED_SYRINGE | Freq: Once | INTRAMUSCULAR | Status: AC
  Administered 2023-01-21: 156 mg via INTRAMUSCULAR
  Filled 2023-01-20: qty 1

## 2023-01-20 MED ORDER — BENZTROPINE MESYLATE 0.5 MG PO TABS
0.5000 mg | ORAL_TABLET | Freq: Every day | ORAL | 0 refills | Status: DC
  Filled 2023-01-20: qty 30, 30d supply, fill #0

## 2023-01-20 MED ORDER — BENZTROPINE MESYLATE 0.5 MG PO TABS: 0.5000 mg | ORAL_TABLET | Freq: Every day | ORAL | 1 refills | Status: DC

## 2023-01-20 NOTE — Progress Notes (Signed)
   01/20/23 1950  Psych Admission Type (Psych Patients Only)  Admission Status Involuntary  Psychosocial Assessment  Patient Complaints None  Eye Contact Fair  Facial Expression Flat  Affect Flat  Speech Logical/coherent  Interaction Assertive  Motor Activity Slow  Appearance/Hygiene In scrubs  Behavior Characteristics Cooperative  Mood Preoccupied  Thought Process  Coherency Circumstantial  Content Preoccupation  Delusions None reported or observed  Perception WDL  Hallucination None reported or observed  Judgment UTA  Confusion None  Danger to Self  Current suicidal ideation? Denies  Agreement Not to Harm Self Yes  Description of Agreement verbal  Danger to Others  Danger to Others None reported or observed   Progress note   D: Pt seen in his room in bed. Pt denies SI, HI, AVH. Contracts for safety. Pt rates pain  0/10. Pt did not respond when asked about depression and anxiety. "I have to go to the bathroom. I'll get back to you later." Pt kept repeating this but never got out of the bed. No other concerns noted at this time.  A: Pt provided support and encouragement. Pt given scheduled medication as prescribed. PRNs as appropriate. Q15 min checks for safety.   R: Pt safe on the unit. Will continue to monitor.

## 2023-01-20 NOTE — Group Note (Signed)
[facility] LCSW Group Therapy Note   Group Date: 01/20/2023 Start Time: 1300 End Time: 1400   Type of Therapy/Topic:  Group Therapy:  Emotion Regulation  Participation Level:  Minimal   Description of Group:    The purpose of this group is to assist patients in learning to regulate negative emotions and experience positive emotions. Patients will be guided to discuss ways in which they have been vulnerable to their negative emotions. These vulnerabilities will be juxtaposed with experiences of positive emotions or situations, and patients challenged to use positive emotions to combat negative ones. Special emphasis will be placed on coping with negative emotions in conflict situations, and patients will process healthy conflict resolution skills.  Therapeutic Goals: Patient will identify two positive emotions or experiences to reflect on in order to balance out negative emotions:  Patient will label two or more emotions that they find the most difficult to experience:  Patient will be able to demonstrate positive conflict resolution skills through discussion or role plays:   Summary of Patient Progress:  Patient was present for the entirety of group session. Patient participated in opening and closing remarks. However, patient did not contribute at all to the topic of discussion despite encouraged participation.     Therapeutic Modalities:   Cognitive Behavioral Therapy Feelings Identification Dialectical Behavioral Therapy   Corky Crafts, [place]

## 2023-01-20 NOTE — Group Note (Signed)
Recreation Therapy Group Note   Group Topic:Leisure Education  Group Date: 01/20/2023 Start Time: 1000 End Time: 1100 Facilitators: Rosina Lowenstein, LRT, CTRS Location: Courtyard  Group Description: Leisure. Patients were given the option to choose from listening to music, playing basketball, drawing with sidewalk chalk, or playing with a deck of cards. LRT and pts discussed the importance of participating in leisure during their free time and when they're outside of the hospital. Pt identified two leisure interests and shared with the group.   Goal Area(s) Addressed:  Patient will identify a current leisure interest.  Patient will practice making a positive decision. Patient will have the opportunity to try a new leisure activity.  Affect/Mood: Appropriate   Participation Level: Active and Engaged   Participation Quality: Independent   Behavior: Appropriate, Calm, and Cooperative   Speech/Thought Process: Coherent   Insight: Good   Judgement: Good   Modes of Intervention: Activity   Patient Response to Interventions:  Attentive, Engaged, Interested , and Receptive   Education Outcome:  Acknowledges education   Clinical Observations/Individualized Feedback: Eric Pineda was active in their participation of session activities and group discussion. Pt identified "I like to listen to music, mainly country" as things he enjoys doing in his free time. Pt chose to play basketball while outside. Pt interacted well with LRT and peers while outside.    Plan: Continue to engage patient in RT group sessions 2-3x/week.   Rosina Lowenstein, LRT, CTRS 01/20/2023 11:20 AM

## 2023-01-20 NOTE — Progress Notes (Signed)
PT refused PM medications, irritable/agitated when awakened to take medications and refused.  However, pt was calm, cooperative and willing to take AM medication and vital signs around 6 AM.  Pt denies SI HI AVH.  Continued monitoring for safety.    01/19/23 2200  Psych Admission Type (Psych Patients Only)  Admission Status Involuntary  Psychosocial Assessment  Patient Complaints None  Eye Contact None  Facial Expression Flat  Interaction Isolative  Appearance/Hygiene In scrubs  Behavior Characteristics Resistant to care  Mood Irritable  Thought Process  Coherency Unable to assess  Content UTA  Delusions UTA  Perception UTA  Hallucination UTA  Judgment UTA  Confusion UTA  Danger to Others  Danger to Others None reported or observed

## 2023-01-20 NOTE — Group Note (Signed)
Date:  01/20/2023 Time:  9:33 PM  Group Topic/Focus:  Personal Choices and Values:   The focus of this group is to help patients assess and explore the importance of values in their lives, how their values affect their decisions, how they express their values and what opposes their expression.    Participation Level:  Did Not Attend  Additional Comments:  Patient did not attend group.  Dallie Piles Shayon Trompeter 01/20/2023, 9:33 PM

## 2023-01-20 NOTE — BH IP Treatment Plan (Signed)
Interdisciplinary Treatment and Diagnostic Plan Update  01/20/2023 Time of Session: 0830 Eric Pineda MRN: [id number]  Principal Diagnosis: Schizophrenia [facility])  Secondary Diagnoses: Principal Problem:   Schizophrenia (HCC)   Current Medications:  Current Facility-Administered Medications  Medication Dose Route Frequency Provider Last Rate Last Admin   acetaminophen (TYLENOL) tablet 650 mg  650 mg Oral Q6H PRN Leevy-Johnson, Brooke A, NP       alum & mag hydroxide-simeth (MAALOX/MYLANTA) 200-200-20 MG/5ML suspension 30 mL  30 mL Oral Q4H PRN Leevy-Johnson, Brooke A, NP       benztropine (COGENTIN) tablet 0.5 mg  0.5 mg Oral QHS Clapacs, John T, MD   0.5 mg at 01/18/23 2143   diphenhydrAMINE (BENADRYL) capsule 50 mg  50 mg Oral Q6H PRN Leevy-Johnson, Brooke A, NP       Or   diphenhydrAMINE (BENADRYL) injection 50 mg  50 mg Intramuscular Q6H PRN Leevy-Johnson, Brooke A, NP       haloperidol (HALDOL) tablet 5 mg  5 mg Oral Q6H PRN Leevy-Johnson, Brooke A, NP       Or   haloperidol lactate (HALDOL) injection 5 mg  5 mg Intramuscular Q6H PRN Leevy-Johnson, Brooke A, NP       LORazepam (ATIVAN) tablet 2 mg  2 mg Oral Q6H PRN Leevy-Johnson, Brooke A, NP       Or   LORazepam (ATIVAN) injection 2 mg  2 mg Intramuscular Q6H PRN Leevy-Johnson, Brooke A, NP       LORazepam (ATIVAN) tablet 1 mg  1 mg Oral Q6H Clapacs, John T, MD   1 mg at 01/20/23 4098   magnesium hydroxide (MILK OF MAGNESIA) suspension 30 mL  30 mL Oral Daily PRN Leevy-Johnson, Brooke A, NP       paliperidone (INVEGA) 24 hr tablet 9 mg  9 mg Oral QHS Clapacs, John T, MD   9 mg at 01/18/23 2143   PTA Medications: Medications Prior to Admission  Medication Sig Dispense Refill Last Dose   LORazepam (ATIVAN) 0.5 MG tablet Take 1 tablet (0.5 mg total) by mouth 2 (two) times daily. (Patient not taking: Reported on 01/14/2023) 60 tablet 1    paliperidone (INVEGA SUSTENNA) 156 MG/ML SUSY injection Inject 1 mL (156 mg total) into the  muscle every 28 (twenty-eight) days. (Patient not taking: Reported on 01/14/2023) 1.2 mL 1     Patient Stressors: Medication change or noncompliance    Patient Strengths: Supportive family/friends   Treatment Modalities: Medication Management, Group therapy, Case management,  1 to 1 session with clinician, Psychoeducation, Recreational therapy.   Physician Treatment Plan for Primary Diagnosis: Schizophrenia (HCC) Long Term Goal(s): Improvement in symptoms so as ready for discharge   Short Term Goals: Ability to maintain clinical measurements within normal limits will improve Ability to demonstrate self-control will improve Compliance with prescribed medications will improve  Medication Management: Evaluate patient's response, side effects, and tolerance of medication regimen.  Therapeutic Interventions: 1 to 1 sessions, Unit Group sessions and Medication administration.  Evaluation of Outcomes: Progressing  Physician Treatment Plan for Secondary Diagnosis: Principal Problem:   Schizophrenia (HCC)  Long Term Goal(s): Improvement in symptoms so as ready for discharge   Short Term Goals: Ability to maintain clinical measurements within normal limits will improve Ability to demonstrate self-control will improve Compliance with prescribed medications will improve     Medication Management: Evaluate patient's response, side effects, and tolerance of medication regimen.  Therapeutic Interventions: 1 to 1 sessions, Unit Group sessions and Medication administration.  Evaluation of Outcomes: Progressing   RN Treatment Plan for Primary Diagnosis: Schizophrenia (HCC) Long Term Goal(s): Knowledge of disease and therapeutic regimen to maintain health will improve  Short Term Goals: Ability to remain free from injury will improve, Ability to verbalize frustration and anger appropriately will improve, Ability to demonstrate self-control, Ability to participate in decision making will  improve, Ability to verbalize feelings will improve, Ability to disclose and discuss suicidal ideas, Ability to identify and develop effective coping behaviors will improve, and Compliance with prescribed medications will improve  Medication Management: RN will administer medications as ordered by provider, will assess and evaluate patient's response and provide education to patient for prescribed medication. RN will report any adverse and/or side effects to prescribing provider.  Therapeutic Interventions: 1 on 1 counseling sessions, Psychoeducation, Medication administration, Evaluate responses to treatment, Monitor vital signs and CBGs as ordered, Perform/monitor CIWA, COWS, AIMS and Fall Risk screenings as ordered, Perform wound care treatments as ordered.  Evaluation of Outcomes: Progressing   LCSW Treatment Plan for Primary Diagnosis: Schizophrenia (HCC) Long Term Goal(s): Safe transition to appropriate next level of care at discharge, Engage patient in therapeutic group addressing interpersonal concerns.  Short Term Goals: Engage patient in aftercare planning with referrals and resources, Increase social support, Increase ability to appropriately verbalize feelings, Increase emotional regulation, Facilitate acceptance of mental health diagnosis and concerns, Facilitate patient progression through stages of change regarding substance use diagnoses and concerns, Identify triggers associated with mental health/substance abuse issues, and Increase skills for wellness and recovery  Therapeutic Interventions: Assess for all discharge needs, 1 to 1 time with Social worker, Explore available resources and support systems, Assess for adequacy in community support network, Educate family and significant other(s) on suicide prevention, Complete Psychosocial Assessment, Interpersonal group therapy.  Evaluation of Outcomes: Progressing   Progress in Treatment: Attending groups: No. Participating in  groups: No. Taking medication as prescribed: Yes. Toleration medication: Yes. Family/Significant other contact made: No, will contact:  patient declined  Patient understands diagnosis: No. Discussing patient identified problems/goals with staff: Yes. Medical problems stabilized or resolved: Yes. Denies suicidal/homicidal ideation: Yes. Issues/concerns per patient self-inventory: Yes. Other: none  New problem(s) identified: No, Describe:  none  New Short Term/Long Term Goal(s): Patient to work towards elimination of symptoms of psychosis, medication management for mood stabilization; development of comprehensive mental wellness plan.   Patient Goals:   No additional goals identified at this time. Patient to continue to work towards original goals identified in initial treatment team meeting. CSW will remain available to patient should they voice additional treatment goals.   Discharge Plan or Barriers: No psychosocial barriers identified at this time, patient to return to place of residence when appropriate for discharge.   Reason for Continuation of Hospitalization: Medication stabilization Other; describe psychosis   Estimated Length of Stay : 01/22/2023  Scribe for Treatment Team: Corky Crafts, Theresia Majors 01/20/2023 10:12 AM

## 2023-01-20 NOTE — Plan of Care (Signed)
D- Patient alert and oriented. Patient presented in a pleasant mood on assessment stating that he slept "good" last night and had no complaints to voice to this Clinical research associate. Patient denied SI, HI, AVH, and pain at this time. Patient also denied any signs/symptoms of depression/anxiety stating "no, ma'am, y'all have been very cooperative". Patient had no stated goals for today, he was just preoccupied with when he'll be discharging.   A- Scheduled medications administered to patient, per MD orders. Support and encouragement provided.  Routine safety checks conducted every 15 minutes.  Patient informed to notify staff with problems or concerns.  R- No adverse drug reactions noted. Patient contracts for safety at this time. Patient compliant with medications and treatment plan. Patient receptive, calm, and cooperative. Patient interacts well with others on the unit. Patient remains safe at this time.  Problem: Education: Goal: Knowledge of General Education information will improve Description: Including pain rating scale, medication(s)/side effects and non-pharmacologic comfort measures Outcome: Progressing   Problem: Health Behavior/Discharge Planning: Goal: Ability to manage health-related needs will improve Outcome: Progressing   Problem: Clinical Measurements: Goal: Ability to maintain clinical measurements within normal limits will improve Outcome: Progressing Goal: Will remain free from infection Outcome: Progressing Goal: Diagnostic test results will improve Outcome: Progressing Goal: Respiratory complications will improve Outcome: Progressing Goal: Cardiovascular complication will be avoided Outcome: Progressing   Problem: Activity: Goal: Risk for activity intolerance will decrease Outcome: Progressing   Problem: Nutrition: Goal: Adequate nutrition will be maintained Outcome: Progressing   Problem: Coping: Goal: Level of anxiety will decrease Outcome: Progressing   Problem:  Elimination: Goal: Will not experience complications related to bowel motility Outcome: Progressing Goal: Will not experience complications related to urinary retention Outcome: Progressing   Problem: Pain Managment: Goal: General experience of comfort will improve Outcome: Progressing   Problem: Safety: Goal: Ability to remain free from injury will improve Outcome: Progressing   Problem: Skin Integrity: Goal: Risk for impaired skin integrity will decrease Outcome: Progressing

## 2023-01-20 NOTE — Progress Notes (Signed)
[facility] MD Progress Note  01/20/2023 10:40 AM Eric Pineda  MRN:  [id number] Subjective: Follow-up for this 27 year old man with schizophrenia.  Patient seen today and he has no new complaints.  Once again I note that he is significantly improved from when he first came to the hospital.  He is able to sit still and make eye contact and have an appropriate conversation.  Taking his medicine fine.  Able to take the news that it will be another day or 2 before discharge without any problem.  Not showing any problem behavior on the unit Principal Problem: Schizophrenia (HCC) Diagnosis: Principal Problem:   Schizophrenia (HCC)  Total Time spent with patient: 30 minutes  Past Psychiatric History: Past history of schizophrenia  Past Medical History:  Past Medical History:  Diagnosis Date   Bizarre behavior 12/02/2019   History reviewed. No pertinent surgical history. Family History: History reviewed. No pertinent family history. Family Psychiatric  History: See previous Social History:  Social History   Substance and Sexual Activity  Alcohol Use None     Social History   Substance and Sexual Activity  Drug Use Not on file    Social History   Socioeconomic History   Marital status: Single    Spouse name: Not on file   Number of children: Not on file   Years of education: Not on file   Highest education level: Not on file  Occupational History   Not on file  Tobacco Use   Smoking status: Light Smoker   Smokeless tobacco: Never   Tobacco comments:    Will not specify how much he smokes  Substance and Sexual Activity   Alcohol use: Not on file   Drug use: Not on file   Sexual activity: Not on file  Other Topics Concern   Not on file  Social History Narrative   Not on file   Social Determinants of Health   Financial Resource Strain: Not on file  Food Insecurity: No Food Insecurity (01/14/2023)   Hunger Vital Sign    Worried About Running Out of Food in the Last Year: Never  true    Ran Out of Food in the Last Year: Never true  Transportation Needs: No Transportation Needs (01/14/2023)   PRAPARE - Administrator, Civil Service (Medical): No    Lack of Transportation (Non-Medical): No  Physical Activity: Not on file  Stress: Not on file  Social Connections: Not on file   Additional Social History:                         Sleep: Fair  Appetite:  Negative  Current Medications: Current Facility-Administered Medications  Medication Dose Route Frequency Provider Last Rate Last Admin   acetaminophen (TYLENOL) tablet 650 mg  650 mg Oral Q6H PRN Leevy-Johnson, Brooke A, NP       alum & mag hydroxide-simeth (MAALOX/MYLANTA) 200-200-20 MG/5ML suspension 30 mL  30 mL Oral Q4H PRN Leevy-Johnson, Brooke A, NP       benztropine (COGENTIN) tablet 0.5 mg  0.5 mg Oral QHS Lannis Lichtenwalner T, MD   0.5 mg at 01/18/23 2143   diphenhydrAMINE (BENADRYL) capsule 50 mg  50 mg Oral Q6H PRN Leevy-Johnson, Brooke A, NP       Or   diphenhydrAMINE (BENADRYL) injection 50 mg  50 mg Intramuscular Q6H PRN Leevy-Johnson, Brooke A, NP       haloperidol (HALDOL) tablet 5 mg  5 mg Oral  Q6H PRN Leevy-Johnson, Brooke A, NP       Or   haloperidol lactate (HALDOL) injection 5 mg  5 mg Intramuscular Q6H PRN Leevy-Johnson, Brooke A, NP       LORazepam (ATIVAN) tablet 2 mg  2 mg Oral Q6H PRN Leevy-Johnson, Brooke A, NP       Or   LORazepam (ATIVAN) injection 2 mg  2 mg Intramuscular Q6H PRN Leevy-Johnson, Brooke A, NP       LORazepam (ATIVAN) tablet 1 mg  1 mg Oral Q6H Tayley Mudrick T, MD   1 mg at 01/20/23 1610   magnesium hydroxide (MILK OF MAGNESIA) suspension 30 mL  30 mL Oral Daily PRN Leevy-Johnson, Brooke A, NP       paliperidone (INVEGA) 24 hr tablet 9 mg  9 mg Oral QHS [place] Cantero, Jackquline Denmark, MD   9 mg at 01/18/23 2143    Lab Results: No results found for this or any previous visit (from the past 48 hour(s)).  Blood Alcohol level:  Lab Results  Component Value Date    ETH <10 01/13/2023   ETH <10 01/08/2021    Metabolic Disorder Labs: Lab Results  Component Value Date   HGBA1C 5.0 01/18/2023   MPG 96.8 01/18/2023   MPG 99.67 12/03/2019   No results found for: "PROLACTIN" Lab Results  Component Value Date   CHOL 159 01/18/2023   TRIG 103 01/18/2023   HDL 57 01/18/2023   CHOLHDL 2.8 01/18/2023   VLDL 21 01/18/2023   LDLCALC 81 01/18/2023   LDLCALC 104 (H) 10/04/2020    Physical Findings: AIMS: Facial and Oral Movements Muscles of Facial Expression: None, normal Lips and Perioral Area: None, normal Jaw: None, normal Tongue: None, normal,Extremity Movements Upper (arms, wrists, hands, fingers): None, normal Lower (legs, knees, ankles, toes): None, normal, Trunk Movements Neck, shoulders, hips: None, normal, Overall Severity Severity of abnormal movements (highest score from questions above): None, normal Incapacitation due to abnormal movements: None, normal Patient's awareness of abnormal movements (rate only patient's report): No Awareness, Dental Status Current problems with teeth and/or dentures?: No Does patient usually wear dentures?: No  CIWA:    COWS:     Musculoskeletal: Strength & Muscle Tone: within normal limits Gait & Station: normal Patient leans: N/A  Psychiatric Specialty Exam:  Presentation  General Appearance:  Casual  Eye Contact: Minimal  Speech: Slow  Speech Volume: Decreased  Handedness: Right   Mood and Affect  Mood: Dysphoric  Affect: Flat; Restricted   Thought Process  Thought Processes: Disorganized  Descriptions of Associations:Circumstantial  Orientation:Partial  Thought Content:Illogical; Perseveration  History of Schizophrenia/Schizoaffective disorder:Yes  Duration of Psychotic Symptoms:No data recorded Hallucinations:No data recorded Ideas of Reference:None  Suicidal Thoughts:No data recorded Homicidal Thoughts:No data recorded  Sensorium  Memory: Immediate  Poor; Recent Poor; Remote Poor  Judgment: Poor  Insight: Poor   Executive Functions  Concentration: Poor  Attention Span: Poor  Recall: Poor  Fund of Knowledge: Poor  Language: Poor   Psychomotor Activity  Psychomotor Activity:No data recorded  Assets  Assets: Social Support; Physical Health   Sleep  Sleep:No data recorded   Physical Exam: Physical Exam Vitals and nursing note reviewed.  Constitutional:      Appearance: Normal appearance.  HENT:     Head: Normocephalic and atraumatic.     Mouth/Throat:     Pharynx: Oropharynx is clear.  Eyes:     Pupils: Pupils are equal, round, and reactive to light.  Cardiovascular:  Rate and Rhythm: Normal rate and regular rhythm.  Pulmonary:     Effort: Pulmonary effort is normal.     Breath sounds: Normal breath sounds.  Abdominal:     General: Abdomen is flat.     Palpations: Abdomen is soft.  Musculoskeletal:        General: Normal range of motion.  Skin:    General: Skin is warm and dry.  Neurological:     General: No focal deficit present.     Mental Status: He is alert. Mental status is at baseline.  Psychiatric:        Attention and Perception: Attention normal.        Mood and Affect: Mood normal. Affect is blunt.        Speech: Speech normal.        Behavior: Behavior is cooperative.        Thought Content: Thought content normal.    Review of Systems  Constitutional: Negative.   HENT: Negative.    Eyes: Negative.   Respiratory: Negative.    Cardiovascular: Negative.   Gastrointestinal: Negative.   Musculoskeletal: Negative.   Skin: Negative.   Neurological: Negative.   Psychiatric/Behavioral: Negative.     Blood pressure 108/69, pulse 60, temperature 97.8 F (36.6 C), temperature source Oral, resp. rate 20, height 5\' 11"  (1.803 m), weight 66.2 kg, SpO2 98 %. Body mass index is 20.36 kg/m.   Treatment Plan Summary: Medication management and Plan he has been doing so well here on  the ward that I think we can safely go ahead and give him his second Invega injection tomorrow and discontinue the oral and then plan for likely discharge tomorrow.  He has of course been counseled multiple times about the benefits of following up with outpatient treatment.  Not sure that any of it is getting through to him.  Mordecai Rasmussen, MD 01/20/2023, 10:40 AM

## 2023-01-21 ENCOUNTER — Other Ambulatory Visit: Payer: Self-pay

## 2023-01-21 DIAGNOSIS — F203 Undifferentiated schizophrenia: Secondary | ICD-10-CM | POA: Diagnosis not present

## 2023-01-21 MED ORDER — PALIPERIDONE PALMITATE ER 156 MG/ML IM SUSY: 156.0000 mg | PREFILLED_SYRINGE | INTRAMUSCULAR | 1 refills | Status: DC

## 2023-01-21 MED ORDER — PALIPERIDONE PALMITATE ER 156 MG/ML IM SUSY: 156.0000 mg | PREFILLED_SYRINGE | INTRAMUSCULAR | Status: DC

## 2023-01-21 NOTE — Progress Notes (Signed)
Patient ID: Eric Pineda, male   DOB: 03/30/1996, 27 y.o.   MRN: [id number]  Discharge Note:  Patient denies SI/HI/AVH at this time. Discharge instructions, AVS, prescriptions, medication supply and transition record gone over with patient. Patient given a copy of his Suicide Safety Plan. Patient agrees to comply with medication management, follow-up visit, and outpatient therapy. Patient belongings returned to patient. Patient questions and concerns addressed and answered. Patient ambulatory off unit. Patient discharged to home with his Sister.

## 2023-01-21 NOTE — Plan of Care (Signed)

## 2023-01-21 NOTE — Group Note (Signed)
LCSW Group Therapy Note  Group Date: 01/21/2023 Start Time: 1300 End Time: 1400   Type of Therapy and Topic:  Group Therapy - How To Cope with Nervousness about Discharge   Participation Level:  Minimal   Description of Group This process group involved identification of patients' feelings about discharge. Some of them are scheduled to be discharged soon, while others are new admissions, but each of them was asked to share thoughts and feelings surrounding discharge from the hospital. One common theme was that they are excited at the prospect of going home, while another was that many of them are apprehensive about sharing why they were hospitalized. Patients were given the opportunity to discuss these feelings with their peers in preparation for discharge.  Therapeutic Goals  Patient will identify their overall feelings about pending discharge. Patient will think about how they might proactively address issues that they believe will once again arise once they get home (i.e. with parents). Patients will participate in discussion about having hope for change.   Summary of Patient Progress:  Patient was present for the entirety of group session. Patient participated in opening and closing remarks. However, patient did not contribute at all to the topic of discussion despite encouraged participation.    Therapeutic Modalities Cognitive Behavioral Therapy   Almedia Balls 01/21/2023  3:17 PM

## 2023-01-21 NOTE — Progress Notes (Signed)
  [facility] Adult Case Management Discharge Plan :  Will you be returning to the same living situation after discharge:  Yes,  Patient to return to sister's residence.  At discharge, do you have transportation home?: Yes,  family to provide transportation from hospital.  Do you have the ability to pay for your medications: No. Patient has no listed insurance, to be provided with 7 day supply of medication at discharge. CSW has provided patient with clinic information for free/reduced medications.   Release of information consent forms completed and in the chart;  Patient's signature needed at discharge.  Patient to Follow up at:  Follow-up Information     [facility] Go to.   Why: Please present for ne patient clinic walk in hours at 0800 AM to begin services. Contact information: [address] [place] [address] [phone]                 Next level of care provider has access to [facility] Link:no  Safety Planning and Suicide Prevention discussed: Yes,  SPE compelted with patient by nursing staff, patient declined consent for CSW to reach family/friend.    Has patient been referred to the Quitline?: Patient refused referral Tobacco Use: High Risk (01/14/2023)   Patient History    Smoking Tobacco Use: Light Smoker    Smokeless Tobacco Use: Never    Passive Exposure: Not on file   Patient has been referred for addiction treatment: N/A Patient denies active substance use, UDS Negative for all, screened low risk during nursing admission (see [facility] quick tab).  Social History   Substance and Sexual Activity  Alcohol Use None   Social History   Substance and Sexual Activity  Drug Use Not on file  Drugs of Abuse     Component Value Date/Time   LABOPIA NONE DETECTED 01/13/2023 2235   COCAINSCRNUR NONE DETECTED 01/13/2023 2235   LABBENZ NONE DETECTED 01/13/2023 2235   AMPHETMU NONE DETECTED 01/13/2023 2235   THCU NONE DETECTED 01/13/2023 2235   LABBARB  NONE DETECTED 01/13/2023 2235    Corky Crafts, LCSWA 01/21/2023, 9:31 AM

## 2023-01-21 NOTE — Group Note (Signed)
Recreation Therapy Group Note   Group Topic:Health and Wellness  Group Date: 01/21/2023 Start Time: 1000 End Time: 1100 Facilitators: Rosina Lowenstein, LRT, CTRS Location: Courtyard  Group Description: [employer]. LRT and patients played games of basketball and drew with chalk while outside in the courtyard while getting fresh air and sunlight. Music was being played in the background. LRT and peers conversed about different games they have played before. LRT encouraged pts to drink water after being outside, sweating and getting their heart rate up.  Goal Area(s) Addressed: Patient will build on frustration tolerance skills. Patients will partake in a competitive play game with peers. Patients will gain knowledge of new leisure interest/hobby.   Affect/Mood: Appropriate   Participation Level: Active and Engaged   Participation Quality: Independent   Behavior: Appropriate and Calm   Speech/Thought Process: Coherent   Insight: Fair   Judgement: Fair    Modes of Intervention: Activity and Competitive Play   Patient Response to Interventions:  Attentive, Engaged, and Receptive   Education Outcome:  Acknowledges education   Clinical Observations/Individualized Feedback: Eric Pineda was active in their participation of session activities and group discussion. Pt was in and out of group multiple times. Pt played basketball with peers briefly and sat and listened to music. Pt interacted well with peers and LRT duration of session.   Plan: Continue to engage patient in RT group sessions 2-3x/week.   Rosina Lowenstein, LRT, CTRS 01/21/2023 11:32 AM

## 2023-01-21 NOTE — BHH Suicide Risk Assessment (Signed)
[facility] Discharge Suicide Risk Assessment   Principal Problem: Schizophrenia [facility]) Discharge Diagnoses: Principal Problem:   Schizophrenia (HCC)   Total Time spent with patient: 30 minutes  Musculoskeletal: Strength & Muscle Tone: within normal limits Gait & Station: normal Patient leans: N/A  Psychiatric Specialty Exam  Presentation  General Appearance:  Casual  Eye Contact: Minimal  Speech: Slow  Speech Volume: Decreased  Handedness: Right   Mood and Affect  Mood: Dysphoric  Duration of Depression Symptoms: No data recorded Affect: Flat; Restricted   Thought Process  Thought Processes: Disorganized  Descriptions of Associations:Circumstantial  Orientation:Partial  Thought Content:Illogical; Perseveration  History of Schizophrenia/Schizoaffective disorder:Yes  Duration of Psychotic Symptoms:No data recorded Hallucinations:No data recorded Ideas of Reference:None  Suicidal Thoughts:No data recorded Homicidal Thoughts:No data recorded  Sensorium  Memory: Immediate Poor; Recent Poor; Remote Poor  Judgment: Poor  Insight: Poor   Executive Functions  Concentration: Poor  Attention Span: Poor  Recall: Poor  Fund of Knowledge: Poor  Language: Poor   Psychomotor Activity  Psychomotor Activity:No data recorded  Assets  Assets: Social Support; Physical Health   Sleep  Sleep:No data recorded  Physical Exam: Physical Exam Vitals and nursing note reviewed.  Constitutional:      Appearance: Normal appearance.  HENT:     Head: Normocephalic and atraumatic.     Mouth/Throat:     Pharynx: Oropharynx is clear.  Eyes:     Pupils: Pupils are equal, round, and reactive to light.  Cardiovascular:     Rate and Rhythm: Normal rate and regular rhythm.  Pulmonary:     Effort: Pulmonary effort is normal.     Breath sounds: Normal breath sounds.  Abdominal:     General: Abdomen is flat.     Palpations: Abdomen is soft.   Musculoskeletal:        General: Normal range of motion.  Skin:    General: Skin is warm and dry.  Neurological:     General: No focal deficit present.     Mental Status: He is alert. Mental status is at baseline.  Psychiatric:        Attention and Perception: Attention normal.        Mood and Affect: Mood normal.        Speech: Speech normal.        Behavior: Behavior normal.        Thought Content: Thought content normal.        Cognition and Memory: Cognition normal.        Judgment: Judgment normal.    Review of Systems  Constitutional: Negative.   HENT: Negative.    Eyes: Negative.   Respiratory: Negative.    Cardiovascular: Negative.   Gastrointestinal: Negative.   Musculoskeletal: Negative.   Skin: Negative.   Neurological: Negative.   Psychiatric/Behavioral: Negative.     Blood pressure 109/64, pulse 60, temperature 98.1 F (36.7 C), temperature source Oral, resp. rate 18, height 5\' 11"  (1.803 m), weight 66.2 kg, SpO2 99 %. Body mass index is 20.36 kg/m.  Mental Status Per Nursing Assessment::   On Admission:  Suicidal ideation indicated by others  Demographic Factors:  Male  Loss Factors: NA  Historical Factors: NA  Risk Reduction Factors:   Positive social support  Continued Clinical Symptoms:  Schizophrenia:   Less than 82 years old  Cognitive Features That Contribute To Risk:  None    Suicide Risk:  Minimal: No identifiable suicidal ideation.  Patients presenting with no risk factors but with  morbid ruminations; may be classified as minimal risk based on the severity of the depressive symptoms    Plan Of Care/Follow-up recommendations:  Other:  Patient is given a supply of medications and prescriptions for medications and will be referred to follow-up through the [facility] in his home county.  Education provided regarding the importance of continued medication management and follow-up.  Mordecai Rasmussen, MD 01/21/2023, 9:15 AM

## 2023-01-21 NOTE — Discharge Summary (Signed)
Physician Discharge Summary Note  Patient:  Eric Pineda is an 27 y.o., male MRN:  [id number] DOB:  Nov 05, 1995 Patient phone:  [phone] (home)  Patient address:   [address] [place] [address],  Total Time spent with patient: 30 minutes  Date of Admission:  01/14/2023 Date of Discharge: 01/21/2023  Reason for Admission: Patient was admitted because of worsening bizarre behavior social withdrawal and isolation and poor self-care at home being off medication.  Principal Problem: Schizophrenia [facility]) Discharge Diagnoses: Principal Problem:   Schizophrenia [facility])   Past Psychiatric History: History of schizophrenia with intermittent presentations with catatonic symptoms.  History of noncompliance with outpatient treatment.  Past Medical History:  Past Medical History:  Diagnosis Date   Bizarre behavior 12/02/2019   History reviewed. No pertinent surgical history. Family History: History reviewed. No pertinent family history. Family Psychiatric  History: None reported Social History:  Social History   Substance and Sexual Activity  Alcohol Use None     Social History   Substance and Sexual Activity  Drug Use Not on file    Social History   Socioeconomic History   Marital status: Single    Spouse name: Not on file   Number of children: Not on file   Years of education: Not on file   Highest education level: Not on file  Occupational History   Not on file  Tobacco Use   Smoking status: Light Smoker   Smokeless tobacco: Never   Tobacco comments:    Will not specify how much he smokes  Substance and Sexual Activity   Alcohol use: Not on file   Drug use: Not on file   Sexual activity: Not on file  Other Topics Concern   Not on file  Social History Narrative   Not on file   Social Determinants of Health   Financial Resource Strain: Not on file  Food Insecurity: No Food Insecurity (01/14/2023)   Hunger Vital Sign    Worried About Running Out of  Food in the Last Year: Never true    Ran Out of Food in the Last Year: Never true  Transportation Needs: No Transportation Needs (01/14/2023)   PRAPARE - Administrator, Civil Service (Medical): No    Lack of Transportation (Non-Medical): No  Physical Activity: Not on file  Stress: Not on file  Social Connections: Not on file    Hospital Course: Patient admitted to psychiatric unit.  15-minute checks continued.  Patient did not display any violent or dangerous behavior on the unit.  Initially quite withdrawn and unable to communicate effectively.  He was compliant with medication particularly after getting his first long-acting Invega injection.  Gradually improved to where he could hold a reasonable conversation and make eye contact be around other people.  He was able to take care of his ADLs appropriately and did not display dangerous behavior.  At the time of discharge he has been given both loading doses of Invega and will be discharged with a prescription for the follow-up Invega injection as well as for Cogentin.  Otherwise not on any medication.  Patient has been educated that he can follow-up with [facility] in [place].  He actually expressed a little bit of interest in that today giving [place] some hope that perhaps he will follow-up.  He has been educated that doing so would greatly improve his long-term functioning.  Physical Findings: AIMS: Facial and Oral Movements Muscles of Facial Expression: None, normal Lips and  Perioral Area: None, normal Jaw: None, normal Tongue: None, normal,Extremity Movements Upper (arms, wrists, hands, fingers): None, normal Lower (legs, knees, ankles, toes): None, normal, Trunk Movements Neck, shoulders, hips: None, normal, Overall Severity Severity of abnormal movements (highest score from questions above): None, normal Incapacitation due to abnormal movements: None, normal Patient's awareness of abnormal movements (rate only patient's report):  No Awareness, Dental Status Current problems with teeth and/or dentures?: No Does patient usually wear dentures?: No  CIWA:    COWS:     Musculoskeletal: Strength & Muscle Tone: within normal limits Gait & Station: normal Patient leans: N/A   Psychiatric Specialty Exam:  Presentation  General Appearance:  Casual  Eye Contact: Minimal  Speech: Slow  Speech Volume: Decreased  Handedness: Right   Mood and Affect  Mood: Dysphoric  Affect: Flat; Restricted   Thought Process  Thought Processes: Disorganized  Descriptions of Associations:Circumstantial  Orientation:Partial  Thought Content:Illogical; Perseveration  History of Schizophrenia/Schizoaffective disorder:Yes  Duration of Psychotic Symptoms:No data recorded Hallucinations:No data recorded Ideas of Reference:None  Suicidal Thoughts:No data recorded Homicidal Thoughts:No data recorded  Sensorium  Memory: Immediate Poor; Recent Poor; Remote Poor  Judgment: Poor  Insight: Poor   Executive Functions  Concentration: Poor  Attention Span: Poor  Recall: Poor  Fund of Knowledge: Poor  Language: Poor   Psychomotor Activity  Psychomotor Activity:No data recorded  Assets  Assets: Social Support; Physical Health   Sleep  Sleep:No data recorded   Physical Exam: Physical Exam Constitutional:      Appearance: Normal appearance.  HENT:     Head: Normocephalic and atraumatic.     Mouth/Throat:     Pharynx: Oropharynx is clear.  Eyes:     Pupils: Pupils are equal, round, and reactive to light.  Cardiovascular:     Rate and Rhythm: Normal rate and regular rhythm.  Pulmonary:     Effort: Pulmonary effort is normal.     Breath sounds: Normal breath sounds.  Abdominal:     General: Abdomen is flat.     Palpations: Abdomen is soft.  Musculoskeletal:        General: Normal range of motion.  Skin:    General: Skin is warm and dry.  Neurological:     General: No focal  deficit present.     Mental Status: He is alert. Mental status is at baseline.  Psychiatric:        Attention and Perception: Attention normal.        Mood and Affect: Mood normal.        Speech: Speech normal.        Behavior: Behavior normal.        Thought Content: Thought content normal.        Cognition and Memory: Cognition normal.        Judgment: Judgment normal.    Review of Systems  Constitutional: Negative.   HENT: Negative.    Eyes: Negative.   Respiratory: Negative.    Cardiovascular: Negative.   Gastrointestinal: Negative.   Musculoskeletal: Negative.   Skin: Negative.   Neurological: Negative.   Psychiatric/Behavioral: Negative.     Blood pressure 109/64, pulse 60, temperature 98.1 F (36.7 C), temperature source Oral, resp. rate 18, height 5\' 11"  (1.803 m), weight 66.2 kg, SpO2 99 %. Body mass index is 20.36 kg/m.   Social History   Tobacco Use  Smoking Status Light Smoker  Smokeless Tobacco Never  Tobacco Comments   Will not specify how much he smokes  Tobacco Cessation:  A prescription for an FDA-approved tobacco cessation medication was offered at discharge and the patient refused   Blood Alcohol level:  Lab Results  Component Value Date   [facility] <10 01/13/2023   ETH <10 01/08/2021    Metabolic Disorder Labs:  Lab Results  Component Value Date   HGBA1C 5.0 01/18/2023   MPG 96.8 01/18/2023   MPG 99.67 12/03/2019   No results found for: "PROLACTIN" Lab Results  Component Value Date   CHOL 159 01/18/2023   TRIG 103 01/18/2023   HDL 57 01/18/2023   CHOLHDL 2.8 01/18/2023   VLDL 21 01/18/2023   LDLCALC 81 01/18/2023   LDLCALC 104 (H) 10/04/2020    See Psychiatric Specialty Exam and Suicide Risk Assessment completed by Attending Physician prior to discharge.  Discharge destination:  Home  Is patient on multiple antipsychotic therapies at discharge:  No   Has Patient had three or more failed trials of antipsychotic monotherapy by  history:  No  Recommended Plan for Multiple Antipsychotic Therapies: NA  Discharge Instructions     Diet - low sodium heart healthy   Complete by: As directed    Increase activity slowly   Complete by: As directed       Allergies as of 01/21/2023   No Known Allergies      Medication List     STOP taking these medications    LORazepam 0.5 MG tablet Commonly known as: ATIVAN       TAKE these medications      Indication  benztropine 0.5 MG tablet Commonly known as: COGENTIN Take 1 tablet (0.5 mg total) by mouth at bedtime.  Indication: Extrapyramidal Reaction caused by Medications   paliperidone 156 MG/ML Susy injection Commonly known as: INVEGA SUSTENNA Inject 1 mL (156 mg total) into the muscle every 28 (twenty-eight) days. Start taking on: Feb 18, 2023  Indication: Schizophrenia         Follow-up recommendations:  Other:  Strongly encouraged follow-up with mental health agency in home county.  Information provided.  Prescription provided.  Comments: See above.  Signed: Mordecai Rasmussen, MD 01/21/2023, 9:19 AM

## 2023-01-21 NOTE — Progress Notes (Signed)
D- Patient alert and oriented. Patient presents in a pleasant mood on assessment stating that he slept ok last night and had no complaints to voice to this Clinical research associate. Patient denies SI, HI, AVH, and pain at this time. Patient also denies any signs/symptoms of depression and anxiety. Patient had no stated goals for today, however, he is fixated on discharging.  A- Scheduled medications administered to patient, per MD orders. Support and encouragement provided.  Routine safety checks conducted every 15 minutes.  Patient informed to notify staff with problems or concerns.  R- No adverse drug reactions noted. Patient contracts for safety at this time. Patient compliant with medications and treatment plan. Patient receptive, calm, and cooperative. Patient interacts well with others on the unit. Patient remains safe at this time.

## 2024-01-14 ENCOUNTER — Emergency Department
Admission: EM | Admit: 2024-01-14 | Discharge: 2024-01-17 | Disposition: A | Discharge status: Home / Self Care | Payer: MEDICAID | Attending: Emergency Medicine | Admitting: Emergency Medicine

## 2024-01-14 ENCOUNTER — Encounter: Payer: Self-pay | Admitting: Intensive Care

## 2024-01-14 ENCOUNTER — Other Ambulatory Visit: Payer: Self-pay

## 2024-01-14 DIAGNOSIS — R462 Strange and inexplicable behavior: Secondary | ICD-10-CM | POA: Insufficient documentation

## 2024-01-14 DIAGNOSIS — F209 Schizophrenia, unspecified: Secondary | ICD-10-CM | POA: Diagnosis present

## 2024-01-14 DIAGNOSIS — R4689 Other symptoms and signs involving appearance and behavior: Secondary | ICD-10-CM | POA: Diagnosis present

## 2024-01-14 LAB — COMPREHENSIVE METABOLIC PANEL WITH GFR
ALT: 14 U/L (ref 0–44)
AST: 19 U/L (ref 15–41)
Albumin: 4.9 g/dL (ref 3.5–5.0)
Alkaline Phosphatase: 60 U/L (ref 38–126)
Anion gap: 5 (ref 5–15)
BUN: 18 mg/dL (ref 6–20)
CO2: 26 mmol/L (ref 22–32)
Calcium: 9.3 mg/dL (ref 8.9–10.3)
Chloride: 105 mmol/L (ref 98–111)
Creatinine, Ser: 1.11 mg/dL (ref 0.61–1.24)
GFR, Estimated: >60 mL/min (ref >60)
Glucose, Bld: 88 mg/dL (ref 70–99)
Potassium: 3.8 mmol/L (ref 3.5–5.1)
Sodium: 136 mmol/L (ref 135–145)
Total Bilirubin: 0.5 mg/dL (ref 0.0–1.2)
Total Protein: 7.5 g/dL (ref 6.5–8.1)

## 2024-01-14 LAB — URINE DRUG SCREEN, QUALITATIVE (ARMC ONLY)
Amphetamines, Ur Screen: NOT DETECTED
Barbiturates, Ur Screen: NOT DETECTED
Benzodiazepine, Ur Scrn: NOT DETECTED
Cannabinoid 50 Ng, Ur ~~LOC~~: NOT DETECTED
Cocaine Metabolite,Ur ~~LOC~~: NOT DETECTED
MDMA (Ecstasy)Ur Screen: NOT DETECTED
Methadone Scn, Ur: NOT DETECTED
Opiate, Ur Screen: NOT DETECTED
Phencyclidine (PCP) Ur S: NOT DETECTED
Tricyclic, Ur Screen: NOT DETECTED

## 2024-01-14 LAB — CBC
HCT: 41.7 % (ref 39.0–52.0)
Hemoglobin: 14.4 g/dL (ref 13.0–17.0)
MCH: 30.6 pg (ref 26.0–34.0)
MCHC: 34.5 g/dL (ref 30.0–36.0)
MCV: 88.5 fL (ref 80.0–100.0)
Platelets: 284 10*3/uL (ref 150–400)
RBC: 4.71 MIL/uL (ref 4.22–5.81)
RDW: 11.9 % (ref 11.5–15.5)
WBC: 8.4 10*3/uL (ref 4.0–10.5)
nRBC: 0 % (ref 0.0–0.2)

## 2024-01-14 LAB — ACETAMINOPHEN LEVEL: Acetaminophen (Tylenol), Serum: <10 ug/mL — ABNORMAL LOW (ref 10–30)

## 2024-01-14 LAB — SALICYLATE LEVEL: Salicylate Lvl: <7 mg/dL — ABNORMAL LOW (ref 7.0–30.0)

## 2024-01-14 LAB — ETHANOL: Alcohol, Ethyl (B): <15 mg/dL (ref <15)

## 2024-01-14 MED ORDER — ALUM & MAG HYDROXIDE-SIMETH 200-200-20 MG/5ML PO SUSP: 30.0000 mL | Freq: Four times a day (QID) | ORAL | Status: DC | PRN

## 2024-01-14 MED ORDER — ONDANSETRON HCL 4 MG PO TABS: 4.0000 mg | ORAL_TABLET | Freq: Three times a day (TID) | ORAL | Status: DC | PRN

## 2024-01-14 MED ORDER — IBUPROFEN 600 MG PO TABS: 600.0000 mg | ORAL_TABLET | Freq: Three times a day (TID) | ORAL | Status: DC | PRN

## 2024-01-14 NOTE — ED Notes (Signed)
 IVC PENDING  CONSULT ?

## 2024-01-14 NOTE — BH Assessment (Signed)
 Comprehensive Clinical Assessment (CCA) Note  01/14/2024 Eric Pineda [id number]  Chief Complaint: No chief complaint on file.  Visit Diagnosis: Schizophrenia   Eric Pineda is a 28 year old male who presents to the ER, under IVC. Per the patient sister [name] [phone]) they have noticed a change in his behaviors within the last two weeks. He is becoming paranoid and distant. He is walking around the house ten times and then the barn for ten times. When they call him or asked what he is doing, he will respond with an attitude or verbally aggressive. They also state, for the last two months, after he eats, he goes outside and vomits and then comes in the home and eat again. They family believes his current behaviors are due to substance use. Patient's UDS is negative for any substances. Patient has a history of schizophrenia and he refuses to take medications, as well as follow up with a provider.  During the interview, the patient was calm and pleasant. However, he was withdrawn and guarded. Majority of his answers were "I don't know and the usual."  CCA Screening, Triage and Referral (STR)  Patient Reported Information How did you hear about us ? Self  What Is the Reason for Your Visit/Call Today? Patient brought to the ER due to concerns about his mental health and behaviors.  How Long Has This Been Causing You Problems? 1 wk - 1 month  What Do You Feel Would Help You the Most Today? Treatment for Depression or other mood problem; Alcohol or Drug Use Treatment   Have You Recently Had Any Thoughts About Hurting Yourself? No  Are You Planning to Commit Suicide/Harm Yourself At This time? No   Flowsheet Row ED from 01/14/2024 in [facility] Emergency Department at [facility] Most recent reading at 01/14/2024  3:55 PM Admission (Discharged) from 01/14/2023 in [facility] INPATIENT BEHAVIORAL MEDICINE Most recent reading at 01/14/2023  6:15 PM ED from 01/14/2023 in [facility]  Emergency Department at [facility] Most recent reading at 01/13/2023 10:33 PM  C-SSRS RISK CATEGORY No Risk No Risk No Risk       Have you Recently Had Thoughts About Hurting Someone Marigene Shoulder? No  Are You Planning to Harm Someone at This Time? No  Explanation: No data recorded  Have You Used Any Alcohol or Drugs in the Past 24 Hours? No  How Long Ago Did You Use Drugs or Alcohol? No data recorded What Did You Use and How Much? No data recorded  Do You Currently Have a Therapist/Psychiatrist? No  Name of Therapist/Psychiatrist:    Have You Been Recently Discharged From Any Office Practice or Programs? No  Explanation of Discharge From Practice/Program: No data recorded    CCA Screening Triage Referral Assessment Type of Contact: Face-to-Face  Telemedicine Service Delivery:   Is this Initial or Reassessment?   Date Telepsych consult ordered in [facility]:    Time Telepsych consult ordered in [facility]:    Location of Assessment: [facility] ED  Provider Location: [facility] ED   Collateral Involvement: No data recorded  Does Patient Have a Court Appointed Legal Guardian? No  Legal Guardian Contact Information: No data recorded Copy of Legal Guardianship Form: No data recorded Legal Guardian Notified of Arrival: No data recorded Legal Guardian Notified of Pending Discharge: No data recorded If Minor and Not Living with Parent(s), Who has Custody? No data recorded Is CPS involved or ever been involved? Never  Is APS involved or ever been involved? Never   Patient Determined To  Be At Risk for Harm To Self or Others Based on Review of Patient Reported Information or Presenting Complaint? Yes, for Harm to Others  Method: No data recorded Availability of Means: No data recorded Intent: No data recorded Notification Required: No data recorded Additional Information for Danger to Others Potential: No data recorded Additional Comments for Danger to Others Potential: No data recorded Are  There Guns or Other Weapons in Your Home? No  Types of Guns/Weapons: No data recorded Are These Weapons Safely Secured?                            No  Who Could Verify You Are Able To Have These Secured: No data recorded Do You Have any Outstanding Charges, Pending Court Dates, Parole/Probation? No data recorded Contacted To Inform of Risk of Harm To Self or Others: No data recorded   Does Patient Present under Involuntary Commitment? Yes    [place] of Residence: [place]   Patient Currently Receiving the Following Services: Not Receiving Services   Determination of Need: Emergent (2 hours)   Options For Referral: ED Referral     CCA Biopsychosocial Patient Reported Schizophrenia/Schizoaffective Diagnosis in Past: No   Strengths: Have support system, pleasant and able to take of his ADL's.   Mental Health Symptoms Depression:  Change in energy/activity; Hopelessness   Duration of Depressive symptoms: Duration of Depressive Symptoms: Greater than two weeks   Mania:  N/A   Anxiety:   N/A   Psychosis:  None   Duration of Psychotic symptoms:    Trauma:  N/A   Obsessions:  N/A   Compulsions:  N/A   Inattention:  N/A   Hyperactivity/Impulsivity:  N/A   Oppositional/Defiant Behaviors:  N/A   Emotional Irregularity:  N/A   Other Mood/Personality Symptoms:  No data recorded   Mental Status Exam Appearance and self-care  Stature:  Average   Weight:  Average weight   Clothing:  Casual   Grooming:  Normal   Cosmetic use:  None   Posture/gait:  Normal   Motor activity:  -- (Within normal range)   Sensorium  Attention:  Normal   Concentration:  Variable   Orientation:  X5   Recall/memory:  -- (Unable to asses)   Affect and Mood  Affect:  Blunted; Flat   Mood:  Depressed   Relating  Eye contact:  Normal   Facial expression:  Constricted   Attitude toward examiner:  Guarded   Thought and Language  Speech flow: Clear and Coherent    Thought content:  Appropriate to Mood and Circumstances   Preoccupation:  -- (Unable to asses)   Hallucinations:  None   Organization:  -- (Unable to asses)   [employer] of Knowledge:  Average   Intelligence:  Average   Abstraction:  -- (Unable to asses)   Judgement:  -- (Unable to asses)   Reality Testing:  -- (Unable to asses)   Insight:  -- (Unable to asses)   Decision Making:  -- (Unable to asses)   Social Functioning  Social Maturity:  -- (Unable to asses)   Social Judgement:  -- (Unable to asses)   Stress  Stressors:  -- (Unable to asses)   Coping Ability:  Exhausted   Skill Deficits:  None   Supports:  Family     Religion: Religion/Spirituality Are You A Religious Person?: No  Leisure/Recreation: Leisure / Recreation Do You Have Hobbies?: No  Exercise/Diet:  Exercise/Diet Do You Exercise?: No Have You Gained or Lost A Significant Amount of Weight in the Past Six Months?: No Do You Follow a Special Diet?: No Do You Have Any Trouble Sleeping?: No   CCA Employment/Education Employment/Work Situation: Employment / Work Situation Employment Situation: Unemployed Patient's Job has Been Impacted by Current Illness: No Has Patient ever Been in Equities trader?: No  Education: Education Is Patient Currently Attending School?: No Did You Have An Individualized Education Program (IIEP): No Did You Have Any Difficulty At [employer]?: No Patient's Education Has Been Impacted by Current Illness: No   CCA Family/Childhood History Family and Relationship History: Family history Marital status: Single Does patient have children?: No  Childhood History:  Childhood History By whom was/is the patient raised?: Both parents Did patient suffer any verbal/emotional/physical/sexual abuse as a child?: No Did patient suffer from severe childhood neglect?: No Has patient ever been sexually abused/assaulted/raped as an adolescent or adult?: No Was  the patient ever a victim of a crime or a disaster?: No Witnessed domestic violence?: No Has patient been affected by domestic violence as an adult?: No       CCA Substance Use Alcohol/Drug Use: Alcohol / Drug Use Pain Medications: See MAR Prescriptions: See MAR Over the Counter: See MAR History of alcohol / drug use?: No history of alcohol / drug abuse Longest period of sobriety (when/how long): n/a                         ASAM's:  Six Dimensions of Multidimensional Assessment  Dimension 1:  Acute Intoxication and/or Withdrawal Potential:      Dimension 2:  Biomedical Conditions and Complications:      Dimension 3:  Emotional, Behavioral, or Cognitive Conditions and Complications:     Dimension 4:  Readiness to Change:     Dimension 5:  Relapse, Continued use, or Continued Problem Potential:     Dimension 6:  Recovery/Living Environment:     ASAM Severity Score:    ASAM Recommended Level of Treatment:     Substance use Disorder (SUD)    Recommendations for Services/Supports/Treatments:    Disposition Recommendation per psychiatric provider: Observe overnight and reasses tomorrow (01/15/2024).   

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
## 2024-01-14 NOTE — Consult Note (Signed)
 Eric Pineda is a 28  yo male presenting to [facility] ED under IVC with a psychiatric history of schizophrenia and a behavioral change of "being uncommunicative at home, forgetful, leaving the stove on. Patient is alert and oriented to self and place. He is unable to identify the year. Patient states his reason for ED admission was due to "the usual". Patient unwilling to engage further during the psychiatric evaluation, responding "I don't know" to further assessment questions. Recommend continued monitoring of symptoms and a reassessment when patient is willing to engage.

## 2024-01-14 NOTE — ED Provider Notes (Signed)
 [facility] Provider Note    Event Date/Time   First MD Initiated Contact with Patient 01/14/24 1403     (approximate)   History   Chief Complaint: Bizarre behavior  HPI  Eric Pineda is a 28 y.o. male patient brought to the ED under IVC due to being uncommunicative at home, forgetful, leaving the stove on.  To me the patient denies SI HI or hallucinations, denies any recent illness, denies pain.  Denies being on any medications or previously being prescribed any medications.  He answers all questions with "no."      Physical Exam   Triage Vital Signs: ED Triage Vitals [01/14/24 1328]  Encounter Vitals Group     BP (!) 132/93     Systolic BP Percentile      Diastolic BP Percentile      Pulse Rate 92     Resp 18     Temp 98.5 F (36.9 C)     Temp Source Oral     SpO2 100 %     Weight 190 lb (86.2 kg)     Height 5\' 11"  (1.803 m)     Head Circumference      Peak Flow      Pain Score 0     Pain Loc      Pain Education      Exclude from Growth Chart     Most recent vital signs: Vitals:   01/14/24 1328  BP: (!) 132/93  Pulse: 92  Resp: 18  Temp: 98.5 F (36.9 C)  SpO2: 100%    General: Awake, no distress. CV:  Good peripheral perfusion.  Resp:  Normal effort.  Abd:  No distention.  Other:  No wounds   ED Results / Procedures / Treatments   Labs (all labs ordered are listed, but only abnormal results are displayed) Labs Reviewed  SALICYLATE LEVEL - Abnormal; Notable for the following components:      Result Value   Salicylate Lvl <7.0 (*)    All other components within normal limits  ACETAMINOPHEN  LEVEL - Abnormal; Notable for the following components:   Acetaminophen  (Tylenol ), Serum <10 (*)    All other components within normal limits  COMPREHENSIVE METABOLIC PANEL WITH GFR  ETHANOL  CBC  URINE DRUG SCREEN, QUALITATIVE ([facility] ONLY)     EKG    RADIOLOGY    PROCEDURES:  Procedures   MEDICATIONS ORDERED  IN ED: Medications  ibuprofen (ADVIL) tablet 600 mg (has no administration in time range)  ondansetron (ZOFRAN) tablet 4 mg (has no administration in time range)  alum & mag hydroxide-simeth (MAALOX/MYLANTA) 200-200-20 MG/5ML suspension 30 mL (has no administration in time range)     IMPRESSION / MDM / ASSESSMENT AND PLAN / ED COURSE  I reviewed the triage vital signs and the nursing notes.  Patient's presentation is most consistent with severe exacerbation of chronic illness.  Patient presents under IVC due to bizarre behavior, he appears withdrawn and I suspect negative symptoms of schizophrenia, likely with medication noncompliance.  Will continue IVC and consult psychiatry.  The patient has been placed in psychiatric observation due to the need to provide a safe environment for the patient while obtaining psychiatric consultation and evaluation, as well as ongoing medical and medication management to treat the patient's condition.  The patient has been placed under full IVC at this time.      FINAL CLINICAL IMPRESSION(S) / ED DIAGNOSES   Final diagnoses:  Bizarre behavior  Rx / DC Orders   ED Discharge Orders     None        Note:  This document was prepared using Dragon voice recognition software and may include unintentional dictation errors.   Jacquie Maudlin, MD 01/14/24 1515

## 2024-01-15 DIAGNOSIS — F209 Schizophrenia, unspecified: Secondary | ICD-10-CM

## 2024-01-15 MED ORDER — LORAZEPAM 2 MG PO TABS: 2.0000 mg | ORAL_TABLET | Freq: Four times a day (QID) | ORAL | Status: DC | PRN

## 2024-01-15 MED ORDER — HALOPERIDOL LACTATE 5 MG/ML IJ SOLN: 10.0000 mg | Freq: Three times a day (TID) | INTRAMUSCULAR | Status: DC | PRN

## 2024-01-15 MED ORDER — PALIPERIDONE ER 3 MG PO TB24
3.0000 mg | ORAL_TABLET | Freq: Every day | ORAL | Status: DC
  Administered 2024-01-15 – 2024-01-17 (×3): 3 mg via ORAL
  Filled 2024-01-15 (×3): qty 1

## 2024-01-15 MED ORDER — BENZTROPINE MESYLATE 1 MG PO TABS
0.5000 mg | ORAL_TABLET | Freq: Every day | ORAL | Status: DC
  Administered 2024-01-15 – 2024-01-17 (×3): 0.5 mg via ORAL
  Filled 2024-01-15 (×3): qty 1

## 2024-01-15 MED ORDER — HALOPERIDOL 5 MG PO TABS: 10.0000 mg | ORAL_TABLET | Freq: Three times a day (TID) | ORAL | Status: DC | PRN

## 2024-01-15 MED ORDER — LORAZEPAM 2 MG/ML IJ SOLN: 2.0000 mg | Freq: Four times a day (QID) | INTRAMUSCULAR | Status: DC | PRN

## 2024-01-15 NOTE — ED Notes (Signed)
 Patient up to the door asking for water, no issues or complaints at this time.
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
## 2024-01-15 NOTE — ED Notes (Signed)
 Patient is watching tv, no signs of distress, staff will continue to monitor for safety.

## 2024-01-15 NOTE — ED Provider Notes (Signed)
 Emergency Medicine Observation Re-evaluation Note  Eric Pineda is a 28 y.o. male, seen on rounds today.  Pt initially presented to the ED for complaints of No chief complaint on file. Currently, the patient is resting.  Physical Exam  BP 109/75 (BP Location: Right Arm)   Pulse 67   Temp 98.8 F (37.1 C) (Oral)   Resp 15   Ht 5\' 11"  (1.803 m)   Wt 86.2 kg   SpO2 96%   BMI 26.50 kg/m   General: No distress   ED Course / MDM  EKG:   I have reviewed the labs performed to date as well as medications administered while in observation.  Recent changes in the last 24 hours include none.  Plan  Current plan is for overnight obs and reassess.    Arline Bennett, MD 01/15/24 [phone]

## 2024-01-15 NOTE — ED Notes (Signed)
 Nurse talked with Patient and He denies Si/hi or avh, He answers yes and no questions , He does not attempt to elaborate or expound on anything for a conversation, poor eye contact noted. Staff will continue to monitor for safety.

## 2024-01-15 NOTE — ED Notes (Signed)
 IVC/Obs.overnight Reassess in Am

## 2024-01-15 NOTE — ED Notes (Signed)
 NP is re-evaluating the Patient at this time, but just a few questions in when the NP was telling him that His parents were concerned He walked back to his room, He would not finish the interview. Staff will continue to monitor for safety.

## 2024-01-16 NOTE — ED Notes (Signed)
Patient is IVC pending placement 

## 2024-01-16 NOTE — ED Notes (Signed)
 Pt Breakfast provided at bedside

## 2024-01-16 NOTE — ED Notes (Signed)
 Given dinner and beverage

## 2024-01-16 NOTE — ED Provider Notes (Signed)
 Emergency Medicine Observation Re-evaluation Note  Eric Pineda is a 28 y.o. male, seen on rounds today.  Pt initially presented to the ED for complaints of No chief complaint on file. Currently, the patient is resting.  Physical Exam  BP 102/66 (BP Location: Right Arm)   Pulse (!) 56   Temp 98.9 F (37.2 C) (Oral)   Resp 15   Ht 5\' 11"  (1.803 m)   Wt 86.2 kg   SpO2 97%   BMI 26.50 kg/m  Physical Exam .Gen:  No acute distress Resp:  Breathing easily and comfortably, no accessory muscle usage Neuro:  Moving all four extremities, no gross focal neuro deficits Psych:  Resting currently, calm when awake   ED Course / MDM  EKG:   I have reviewed the labs performed to date as well as medications administered while in observation.  Recent changes in the last 24 hours include no acute events .  Plan  Current plan is for psyc dispo.    Shane Darling, MD 01/16/24 [phone]

## 2024-01-16 NOTE — ED Notes (Signed)
 Pt lunch was provided at bedside

## 2024-01-17 ENCOUNTER — Inpatient Hospital Stay
Admission: RE | Admit: 2024-01-17 | Discharge: 2024-01-27 | DRG: 885 | Disposition: A | Discharge status: Home / Self Care | Payer: MEDICAID | Source: Intra-hospital | Attending: Psychiatry | Admitting: Psychiatry

## 2024-01-17 ENCOUNTER — Encounter: Payer: Self-pay | Admitting: Psychiatric/Mental Health

## 2024-01-17 ENCOUNTER — Other Ambulatory Visit: Payer: Self-pay

## 2024-01-17 DIAGNOSIS — F200 Paranoid schizophrenia: Principal | ICD-10-CM | POA: Diagnosis present

## 2024-01-17 DIAGNOSIS — F209 Schizophrenia, unspecified: Secondary | ICD-10-CM | POA: Diagnosis not present

## 2024-01-17 DIAGNOSIS — R462 Strange and inexplicable behavior: Secondary | ICD-10-CM | POA: Diagnosis not present

## 2024-01-17 DIAGNOSIS — Z91148 Patient's other noncompliance with medication regimen for other reason: Secondary | ICD-10-CM | POA: Diagnosis not present

## 2024-01-17 DIAGNOSIS — Z5941 Food insecurity: Secondary | ICD-10-CM

## 2024-01-17 DIAGNOSIS — Z79899 Other long term (current) drug therapy: Secondary | ICD-10-CM | POA: Diagnosis not present

## 2024-01-17 MED ORDER — DIPHENHYDRAMINE HCL 50 MG/ML IJ SOLN: 50.0000 mg | Freq: Three times a day (TID) | INTRAMUSCULAR | Status: DC | PRN

## 2024-01-17 MED ORDER — HALOPERIDOL LACTATE 5 MG/ML IJ SOLN: 10.0000 mg | Freq: Three times a day (TID) | INTRAMUSCULAR | Status: DC | PRN

## 2024-01-17 MED ORDER — LORAZEPAM 2 MG/ML IJ SOLN: 2.0000 mg | Freq: Three times a day (TID) | INTRAMUSCULAR | Status: DC | PRN

## 2024-01-17 MED ORDER — HALOPERIDOL 5 MG PO TABS
5.0000 mg | ORAL_TABLET | Freq: Three times a day (TID) | ORAL | Status: DC | PRN
Start: 2024-01-17 — End: 2024-01-28

## 2024-01-17 MED ORDER — ALUM & MAG HYDROXIDE-SIMETH 200-200-20 MG/5ML PO SUSP: 30.0000 mL | ORAL | Status: DC | PRN

## 2024-01-17 MED ORDER — PALIPERIDONE ER 3 MG PO TB24
3.0000 mg | ORAL_TABLET | Freq: Every day | ORAL | Status: DC
  Administered 2024-01-18 – 2024-01-23 (×6): 3 mg via ORAL
  Filled 2024-01-17 (×6): qty 1

## 2024-01-17 MED ORDER — HALOPERIDOL LACTATE 5 MG/ML IJ SOLN: 5.0000 mg | Freq: Three times a day (TID) | INTRAMUSCULAR | Status: DC | PRN

## 2024-01-17 MED ORDER — BENZTROPINE MESYLATE 1 MG PO TABS
0.5000 mg | ORAL_TABLET | Freq: Every day | ORAL | Status: DC
  Administered 2024-01-18 – 2024-01-27 (×10): 0.5 mg via ORAL
  Filled 2024-01-17 (×10): qty 1

## 2024-01-17 MED ORDER — ACETAMINOPHEN 325 MG PO TABS: 650.0000 mg | ORAL_TABLET | Freq: Four times a day (QID) | ORAL | Status: DC | PRN

## 2024-01-17 MED ORDER — DIPHENHYDRAMINE HCL 25 MG PO CAPS: 50.0000 mg | ORAL_CAPSULE | Freq: Three times a day (TID) | ORAL | Status: DC | PRN

## 2024-01-17 MED ORDER — MAGNESIUM HYDROXIDE 400 MG/5ML PO SUSP: 30.0000 mL | Freq: Every day | ORAL | Status: DC | PRN

## 2024-01-17 NOTE — ED Notes (Signed)
 Pt lunch provided at bedside

## 2024-01-17 NOTE — ED Notes (Signed)
 IVC/Pending Re-Eval daily until assessment completed

## 2024-01-17 NOTE — ED Notes (Signed)
 Breakfast tray provided to patient.

## 2024-01-17 NOTE — H&P (Signed)
 Psychiatric Admission Assessment Adult  [name] MRN:  [id number] Date of Evaluation:  01/17/2024 Chief Complaint:  Schizophrenia (HCC) [F20.9] Principal Diagnosis: Schizophrenia (HCC) Diagnosis:  Principal Problem:   Schizophrenia (HCC)  History of Present Illness:  Chart review:  Eric Pineda is a 28 year old male who presented to the ER, under IVC. Per the patient sister [name] [phone]) they have noticed a change in his behaviors within the last two weeks. He is becoming paranoid and distant. He is walking around the house ten times and then the barn for ten times. When they call him or asked what he is doing, he will respond with an attitude or verbally aggressive. They also state, for the last two months, after he eats, he goes outside and vomits and then comes in the home and eat again. They family believes his current behaviors are due to substance use. Patient's UDS is negative for any substances. Patient has a history of schizophrenia and he refuses to take medications, as well as follow up with a provider.  Upon face-to-face evaluation, patient is seen in bed awake. He is guarded and not motivated for this interview. He is alert but partially oriented.  He appears healthy and well nourished. When asked the reason for his admission, Eric Pineda states "just regular". Patient reports he has no illness and did not need to come here. He denies SI/HI/AVH. However, he appears to be preoccupied and guarded. Patient refuse to provide additional information about his mental health history. He politely requests to be left alone and to close his door.   Provider attempted to contact patient's family Sister [name] [phone] and mother Loreda Rodriguez [phone] but there was no response. Will reach out again in AM        CCA Screening, Triage and Referral (STR) Associated Signs/Symptoms: Depression Symptoms:   Unable to assess (Hypo) Manic Symptoms:   Delusions, Irritable Mood, Anxiety Symptoms:   UTA Psychotic Symptoms:  Paranoia, PTSD Symptoms: NA Total Time spent with patient: 30 minutes  Past Psychiatric History: Schizophrenia  Is the patient at risk to self? No.  Has the patient been a risk to self in the past 6 months? No.  Has the patient been a risk to self within the distant past? No.  Is the patient a risk to others? No.  Has the patient been a risk to others in the past 6 months? No.  Has the patient been a risk to others within the distant past? No.   [place] Scale:  Flowsheet Row Admission (Current) from 01/17/2024 in [facility] INPATIENT BEHAVIORAL MEDICINE ED from 01/14/2024 in [facility] Emergency Department at [facility] Admission (Discharged) from 01/14/2023 in [facility] INPATIENT BEHAVIORAL MEDICINE  C-SSRS RISK CATEGORY No Risk No Risk No Risk        Prior Inpatient Therapy: No. If yes, describe NA  Prior Outpatient Therapy: No. If yes, describeNA   Alcohol Screening: 1. How often do you have a drink containing alcohol?: Never 2. How many drinks containing alcohol do you have on a typical day when you are drinking?: 1 or 2 3. How often do you have six or more drinks on one occasion?: Never AUDIT-C Score: 0 Alcohol Brief Interventions/Follow-up: Alcohol education/Brief advice Substance Abuse History in the last 12 months:  Yes.   Consequences of Substance Abuse: UTA Previous Psychotropic Medications: Yes  Psychological Evaluations:  Unknown Past Medical History:  Past Medical History:  Diagnosis Date   Bizarre behavior 12/02/2019   History reviewed. No pertinent surgical  history. Family History: History reviewed. No pertinent family history. Family Psychiatric  History: NA Tobacco Screening:  Social History   Tobacco Use  Smoking Status Never  Smokeless Tobacco Never  Tobacco Comments   Patient denied current use of tobacco    BH Tobacco Counseling     Are you interested in Tobacco Cessation  Medications?  No, patient refused Counseled patient on smoking cessation:  Refused/Declined practical counseling Reason Tobacco Screening Not Completed: No value filed.       Social History:  Social History   Substance and Sexual Activity  Alcohol Use Never     Social History   Substance and Sexual Activity  Drug Use Yes    Additional Social History:                           Allergies:  No Known Allergies Lab Results: No results found for this or any previous visit (from the past 48 hours).  Blood Alcohol level:  Lab Results  Component Value Date   [facility] ALPena <15 01/14/2024   ETH <10 01/13/2023    Metabolic Disorder Labs:  Lab Results  Component Value Date   HGBA1C 5.0 01/18/2023   MPG 96.8 01/18/2023   MPG 99.67 12/03/2019   No results found for: "PROLACTIN" Lab Results  Component Value Date   CHOL 159 01/18/2023   TRIG 103 01/18/2023   HDL 57 01/18/2023   CHOLHDL 2.8 01/18/2023   VLDL 21 01/18/2023   LDLCALC 81 01/18/2023   LDLCALC 104 (H) 10/04/2020    Current Medications: Current Facility-Administered Medications  Medication Dose Route Frequency Provider Last Rate Last Admin   acetaminophen  (TYLENOL ) tablet 650 mg  650 mg Oral Q6H PRN Penn, Cicely, NP       alum & mag hydroxide-simeth (MAALOX/MYLANTA) 200-200-20 MG/5ML suspension 30 mL  30 mL Oral Q4H PRN Penn, Alaine Howells, NP       [START ON 01/18/2024] benztropine  (COGENTIN ) tablet 0.5 mg  0.5 mg Oral Daily Penn, Cicely, NP       haloperidol  (HALDOL ) tablet 5 mg  5 mg Oral TID PRN Monroe Antigua, NP       And   diphenhydrAMINE  (BENADRYL ) capsule 50 mg  50 mg Oral TID PRN Monroe Antigua, NP       haloperidol  lactate (HALDOL ) injection 10 mg  10 mg Intramuscular TID PRN Penn, Alaine Howells, NP       And   diphenhydrAMINE  (BENADRYL ) injection 50 mg  50 mg Intramuscular TID PRN Penn, Alaine Howells, NP       And   LORazepam  (ATIVAN ) injection 2 mg  2 mg Intramuscular TID PRN Penn, Alaine Howells, NP       haloperidol  lactate  (HALDOL ) injection 5 mg  5 mg Intramuscular TID PRN Monroe Antigua, NP       And   diphenhydrAMINE  (BENADRYL ) injection 50 mg  50 mg Intramuscular TID PRN Penn, Alaine Howells, NP       And   LORazepam  (ATIVAN ) injection 2 mg  2 mg Intramuscular TID PRN Penn, Alaine Howells, NP       magnesium  hydroxide (MILK OF MAGNESIA) suspension 30 mL  30 mL Oral Daily PRN Penn, Alaine Howells, NP       [START ON 01/18/2024] paliperidone  (INVEGA ) 24 hr tablet 3 mg  3 mg Oral Daily Penn, Cicely, NP       PTA Medications: No medications prior to admission.    Musculoskeletal: Strength & Muscle Tone: within normal limits Gait &  Station: normal Patient leans: N/A            Psychiatric Specialty Exam:  Presentation  General Appearance:  Casual  Eye Contact: Minimal  Speech: Normal Rate  Speech Volume: Normal  Handedness: Right   Mood and Affect  Mood: -- (indefferent)  Affect: Restricted   Thought Process  Thought Processes: Other (comment)  Duration of Psychotic Symptoms:N/A Past Diagnosis of Schizophrenia or Psychoactive disorder: Yes  Descriptions of Associations:Circumstantial  Orientation:Partial  Thought Content:Illogical  Hallucinations:Hallucinations: None  Ideas of Reference:None  Suicidal Thoughts:Suicidal Thoughts: No  Homicidal Thoughts:Homicidal Thoughts: No   Sensorium  Memory: Immediate Poor; Recent Poor; Remote Poor  Judgment: Poor  Insight: Poor   Executive Functions  Concentration: Poor  Attention Span: Poor  Recall: Poor  Fund of Knowledge: Poor  Language: Fair   Psychomotor Activity  Psychomotor Activity: Psychomotor Activity: Normal   Assets  Assets: Social Support; Physical Health; Housing   Sleep  Sleep: Sleep: Good (I dont have any problem")    Physical Exam: Physical Exam Vitals and nursing note reviewed.  Constitutional:      Appearance: Normal appearance.  HENT:     Head: Normocephalic and atraumatic.      Right Ear: Tympanic membrane normal.     Left Ear: Tympanic membrane normal.     Nose: Nose normal.     Mouth/Throat:     Mouth: Mucous membranes are moist.  Eyes:     Extraocular Movements: Extraocular movements intact.     Pupils: Pupils are equal, round, and reactive to light.  Cardiovascular:     Rate and Rhythm: Normal rate.     Pulses: Normal pulses.  Pulmonary:     Effort: Pulmonary effort is normal.  Musculoskeletal:        General: Normal range of motion.     Cervical back: Normal range of motion and neck supple.  Neurological:     General: No focal deficit present.     Mental Status: He is alert.    Review of Systems  Constitutional: Negative.   HENT: Negative.    Eyes: Negative.   Respiratory: Negative.    Cardiovascular: Negative.   Gastrointestinal: Negative.   Genitourinary: Negative.   Musculoskeletal: Negative.   Skin: Negative.   Neurological: Negative.   Endo/Heme/Allergies: Negative.   Psychiatric/Behavioral:         Unable to assess   Blood pressure 127/77, pulse 65, temperature 98 F (36.7 C), temperature source Oral, resp. rate 16, height 5\' 11"  (1.803 m), weight 86.2 kg, SpO2 100%. Body mass index is 26.5 kg/m.  Treatment Plan Summary: Daily contact with patient to assess and evaluate symptoms and progress in treatment and Medication management  Observation Level/Precautions:  15 minute checks  Laboratory:   NA  Psychotherapy:  per  unit protocol  Medications:  To be determined  Consultations:  NA  Discharge Concerns:  NA  Estimated LOS:4-7 days  Other:     Physician Treatment Plan for Primary Diagnosis: Schizophrenia (HCC) Long Term Goal(s): Improvement in symptoms so as ready for discharge  Short Term Goals: Ability to identify changes in lifestyle to reduce recurrence of condition will improve, Ability to verbalize feelings will improve, Ability to disclose and discuss suicidal ideas, Ability to demonstrate self-control will improve,  Ability to identify and develop effective coping behaviors will improve, Ability to maintain clinical measurements within normal limits will improve, and Compliance with prescribed medications will improve  Physician Treatment Plan for Secondary Diagnosis: Principal Problem:  Schizophrenia (HCC)  Long Term Goal(s): Improvement in symptoms so as ready for discharge  Short Term Goals: Ability to identify changes in lifestyle to reduce recurrence of condition will improve, Ability to verbalize feelings will improve, Ability to disclose and discuss suicidal ideas, Ability to demonstrate self-control will improve, Ability to identify and develop effective coping behaviors will improve, Ability to maintain clinical measurements within normal limits will improve, and Compliance with prescribed medications will improve  I certify that inpatient services furnished can reasonably be expected to improve the patient's condition.    Eric Halsted, NP 4/28/20255:56 PM

## 2024-01-17 NOTE — ED Notes (Signed)
Pt requested shower; provided clean hospital clothing and linens.  Shower setup provided with soap, shampoo, toothbrush/toothpaste, and deodorant.  Pt able to preform own ADL's with no assistance.    

## 2024-01-17 NOTE — BH Assessment (Signed)
 Patient is to be admitted to [facility] on today 01/17/24 by Dr.  Jadapalle .  Attending Physician will be Dr.  Jadapalle .   Patient has been assigned to room 319, by [facility] Charge Nurse Gigi.    ER staff is aware of the admission: Edwina Gram, ER Secretary   Dr. Demetrios Finders, ER MD  Satira Curet, Patient's Nurse  Arnetta Lank Patient Access.

## 2024-01-17 NOTE — Plan of Care (Signed)
  Problem: Education: Goal: Emotional status will improve Outcome: Progressing Goal: Mental status will improve Outcome: Progressing Goal: Verbalization of understanding the information provided will improve Outcome: Not Progressing

## 2024-01-17 NOTE — Progress Notes (Signed)
 Patient ID: Eric Pineda, male   DOB: 1995/12/04, 28 y.o.   MRN: [id number] Admissions note:  Consents signed, handbook detailing the patient's rights, responsibilities, and visitor guidelines provided. Skin/belongings search completed and patient oriented to unit. Patient stable at this time. Patient given the opportunity to express concerns and ask questions. Patient given toiletries. RN will continue to monitor.

## 2024-01-17 NOTE — Group Note (Signed)
 Date:  01/17/2024 Time:  8:49 PM  Group Topic/Focus:  Wrap-Up Group:   The focus of this group is to help patients review their daily goal of treatment and discuss progress on daily workbooks.    Participation Level:  Minimal  Participation Quality:  Appropriate and Attentive  Affect:  Appropriate  Cognitive:  Alert and Appropriate  Insight: Appropriate  Engagement in Group:  Improving  Modes of Intervention:  Discussion and Orientation  Additional Comments:     Maglione,Braven Wolk E 01/17/2024, 8:49 PM

## 2024-01-17 NOTE — ED Notes (Addendum)
 Pt breakfast provided at bedside

## 2024-01-17 NOTE — ED Notes (Signed)
 Pt given shower supplies. Shower completed

## 2024-01-17 NOTE — Group Note (Signed)
 Date:  01/17/2024 Time:  4:44 PM  Group Topic/Focus:  Activity Group: The focus of the group is to promote activity for the patients and encourage them to go outside in the courtyard and get some fresh air and exercise.    Participation Level:  Did Not Attend   Marianna Shirk [place] Agent 01/17/2024, 4:44 PM

## 2024-01-18 NOTE — Group Note (Signed)
 Date:  01/18/2024 Time:  9:26 PM  Group Topic/Focus:  Wrap-Up Group:   The focus of this group is to help patients review their daily goal of treatment and discuss progress on daily workbooks.    Participation Level:  Did Not Attend   Bradley Caffey 01/18/2024, 9:26 PM

## 2024-01-18 NOTE — Progress Notes (Signed)
   01/18/24 1700  Psych Admission Type (Psych Patients Only)  Admission Status Involuntary  Psychosocial Assessment  Patient Complaints None  Eye Contact Brief  Facial Expression Flat  Affect Flat  Speech Soft  Interaction Minimal  Motor Activity Slow  Appearance/Hygiene Unremarkable  Behavior Characteristics Guarded  Mood Anxious  Thought Process  Coherency WDL  Content WDL  Delusions None reported or observed  Perception WDL  Hallucination None reported or observed  Judgment Impaired  Confusion WDL  Danger to Self  Current suicidal ideation? Denies  Agreement Not to Harm Self Yes  Description of Agreement verbal  Danger to Others  Danger to Others None reported or observed

## 2024-01-18 NOTE — Progress Notes (Signed)
 Pt calm and pleasant during assessment denying SI/HI/AVH. Pt observed by this Clinical research associate interacting appropriately with staff and peers on the unit. Pt didn't have any scheduled medications tonight and hasn't requested anything PRN as of now. Pt given education, support, and encouragement to be active in his treatment plan. Pt being monitored Q 15 minutes for safety per unit protocol, remains safe on the unit

## 2024-01-18 NOTE — Progress Notes (Signed)
 [facility] MD Progress Note  01/18/2024 7:25 PM Eric Pineda  MRN:  [id number] Subjective:  "I don't need anything" Principal Problem: Schizophrenia (HCC) Diagnosis: Principal Problem:   Schizophrenia (HCC)  Eric Pineda is a 28 year old male who presented to the ER, under IVC. Per the patient sister Eric Pineda [id number]) they have noticed a change in his behaviors within the last two weeks. He is becoming paranoid and distant. He is walking around the house ten times and then the barn for ten times. When they call him or asked what he is doing, he will respond with an attitude or verbally aggressive. They also state, for the last two months, after he eats, he goes outside and vomits and then comes in the home and eat again. They family believes his current behaviors are due to substance use. Patient's UDS is negative for any substances. Patient has a history of schizophrenia and he refuses to take medications, as well as follow up with a provider.   Upon face-to-face evaluation, patient is seen in bed awake. He is guarded and not motivated for this interview. He is alert but partially oriented.  He appears healthy and well nourished. When asked the reason for his admission, Eric Pineda states "just regular". Patient reports he has no illness and did not need to come here. He denies SI/HI/AVH. However, he appears to be preoccupied and guarded. Patient refuse to provide additional information about his mental health history. He politely requests to be left alone and to close his door.  Patient was able to go to the dayroom for meals.     Patient continues to be resistant to care.   Total Time spent with patient: 30 minutes  Past Psychiatric History: Na  Past Medical History:  Past Medical History:  Diagnosis Date   Bizarre behavior 12/02/2019   History reviewed. No pertinent surgical history. Family History: History reviewed. No pertinent family history. Family Psychiatric  History: NA Social  History:  Social History   Substance and Sexual Activity  Alcohol Use Never     Social History   Substance and Sexual Activity  Drug Use Yes    Social History   Socioeconomic History   Marital status: Single    Spouse name: Not on file   Number of children: Not on file   Years of education: Not on file   Highest education level: Not on file  Occupational History   Not on file  Tobacco Use   Smoking status: Never   Smokeless tobacco: Never   Tobacco comments:    Patient denied current use of tobacco  Vaping Use   Vaping status: Never Used  Substance and Sexual Activity   Alcohol use: Never   Drug use: Yes   Sexual activity: Not on file  Other Topics Concern   Not on file  Social History Narrative   Not on file   Social Drivers of Health   Financial Resource Strain: Not on file  Food Insecurity: Food Insecurity Present (01/17/2024)   Hunger Vital Sign    Worried About Running Out of Food in the Last Year: Often true    Ran Out of Food in the Last Year: Often true  Transportation Needs: No Transportation Needs (01/17/2024)   PRAPARE - Administrator, Civil Service (Medical): No    Lack of Transportation (Non-Medical): No  Physical Activity: Not on file  Stress: Not on file  Social Connections: Unknown (01/17/2024)   Social Connection and Isolation Panel [NHANES]  Frequency of Communication with Friends and Family: Patient declined    Frequency of Social Gatherings with Friends and Family: Patient declined    Attends Religious Services: Patient declined    Database administrator or Organizations: Patient declined    Attends Engineer, structural: Patient declined    Marital Status: Never married   Additional Social History:                         Sleep: Good  Appetite:  Good  Current Medications: Current Facility-Administered Medications  Medication Dose Route Frequency Provider Last Rate Last Admin   acetaminophen   (TYLENOL ) tablet 650 mg  650 mg Oral Q6H PRN Penn, Cicely, NP       alum & mag hydroxide-simeth (MAALOX/MYLANTA) 200-200-20 MG/5ML suspension 30 mL  30 mL Oral Q4H PRN Penn, Cicely, NP       benztropine  (COGENTIN ) tablet 0.5 mg  0.5 mg Oral Daily Penn, Cicely, NP   0.5 mg at 01/18/24 1478   haloperidol  (HALDOL ) tablet 5 mg  5 mg Oral TID PRN Monroe Antigua, NP       And   diphenhydrAMINE  (BENADRYL ) capsule 50 mg  50 mg Oral TID PRN Monroe Antigua, NP       haloperidol  lactate (HALDOL ) injection 10 mg  10 mg Intramuscular TID PRN Penn, Alaine Howells, NP       And   diphenhydrAMINE  (BENADRYL ) injection 50 mg  50 mg Intramuscular TID PRN Penn, Alaine Howells, NP       And   LORazepam  (ATIVAN ) injection 2 mg  2 mg Intramuscular TID PRN Penn, Alaine Howells, NP       haloperidol  lactate (HALDOL ) injection 5 mg  5 mg Intramuscular TID PRN Monroe Antigua, NP       And   diphenhydrAMINE  (BENADRYL ) injection 50 mg  50 mg Intramuscular TID PRN Penn, Alaine Howells, NP       And   LORazepam  (ATIVAN ) injection 2 mg  2 mg Intramuscular TID PRN Penn, Alaine Howells, NP       magnesium  hydroxide (MILK OF MAGNESIA) suspension 30 mL  30 mL Oral Daily PRN Penn, Cicely, NP       paliperidone  (INVEGA ) 24 hr tablet 3 mg  3 mg Oral Daily Penn, Cicely, NP   3 mg at 01/18/24 0910    Lab Results: No results found for this or any previous visit (from the past 48 hours).  Blood Alcohol level:  Lab Results  Component Value Date   [facility] <15 01/14/2024   ETH <10 01/13/2023    Metabolic Disorder Labs: Lab Results  Component Value Date   HGBA1C 5.0 01/18/2023   MPG 96.8 01/18/2023   MPG 99.67 12/03/2019   No results found for: "PROLACTIN" Lab Results  Component Value Date   CHOL 159 01/18/2023   TRIG 103 01/18/2023   HDL 57 01/18/2023   CHOLHDL 2.8 01/18/2023   VLDL 21 01/18/2023   LDLCALC 81 01/18/2023   LDLCALC 104 (H) 10/04/2020    Physical Findings: AIMS:  , ,  ,  ,    CIWA:    COWS:     Musculoskeletal: Strength & Muscle Tone: within  normal limits Gait & Station: normal Patient leans: N/A  Psychiatric Specialty Exam:  Presentation  General Appearance:  Casual  Eye Contact: Minimal  Speech: Normal Rate  Speech Volume: Normal  Handedness: Right   Mood and Affect  Mood: Irritable  Affect: Inappropriate   Thought Process  Thought Processes: Irrevelant  Descriptions of Associations:Circumstantial  Orientation:Partial  Thought Content:Abstract Reasoning; Illogical; Perseveration  History of Schizophrenia/Schizoaffective disorder:Yes  Duration of Psychotic Symptoms:No data recorded Hallucinations:Hallucinations: Other (comment)  Ideas of Reference:Other (comment)  Suicidal Thoughts:Suicidal Thoughts: No  Homicidal Thoughts:Homicidal Thoughts: No   Sensorium  Memory: Immediate Poor; Recent Poor; Remote Poor  Judgment: Poor  Insight: Poor   Executive Functions  Concentration: Poor  Attention Span: Poor  Recall: Poor  Fund of Knowledge: Fair  Language: Fair   Psychomotor Activity  Psychomotor Activity: Psychomotor Activity: Restlessness   Assets  Assets: Social Support; Physical Health   Sleep  Sleep: Sleep: Good    Physical Exam: Physical Exam Vitals and nursing note reviewed.  HENT:     Head: Normocephalic and atraumatic.     Right Ear: Tympanic membrane normal.     Left Ear: Tympanic membrane normal.     Nose: Nose normal.  Eyes:     Extraocular Movements: Extraocular movements intact.     Pupils: Pupils are equal, round, and reactive to light.  Cardiovascular:     Rate and Rhythm: Normal rate.     Pulses: Normal pulses.  Pulmonary:     Effort: Pulmonary effort is normal.  Musculoskeletal:        General: Normal range of motion.     Cervical back: Normal range of motion.  Neurological:     General: No focal deficit present.     Mental Status: He is alert.    Review of Systems  Constitutional: Negative.   HENT: Negative.    Eyes:  Negative.   Respiratory: Negative.    Cardiovascular: Negative.   Gastrointestinal: Negative.   Genitourinary: Negative.   Musculoskeletal: Negative.   Skin: Negative.   Neurological: Negative.   Endo/Heme/Allergies: Negative.   Psychiatric/Behavioral: Negative.     Blood pressure 114/75, pulse 72, temperature 97.9 F (36.6 C), resp. rate 18, height 5\' 11"  (1.803 m), weight 86.2 kg, SpO2 100%. Body mass index is 26.5 kg/m.   Treatment Plan Summary: Daily contact with patient to assess and evaluate symptoms and progress in treatment and Medication management  Elston Halsted, NP 01/18/2024, 7:25 PM

## 2024-01-18 NOTE — Group Note (Signed)
 Date:  01/18/2024 Time:  3:27 PM  Group Topic/Focus:  Wellness Toolbox:   The focus of this group is to discuss various aspects of wellness, balancing those aspects and exploring ways to increase the ability to experience wellness.  Patients will create a wellness toolbox for use upon discharge.    Participation Level:  Did Not Attend   Eric Pineda [facility] 01/18/2024, 3:27 PM

## 2024-01-18 NOTE — Group Note (Signed)
 LCSW Group Therapy Note  Group Date: 01/18/2024 Start Time: 1300 End Time: 1400   Type of Therapy and Topic:  Group Therapy - Healthy vs Unhealthy Coping Skills  Participation Level:  Did Not Attend   Description of Group The focus of this group was to determine what unhealthy coping techniques typically are used by group members and what healthy coping techniques would be helpful in coping with various problems. Patients were guided in becoming aware of the differences between healthy and unhealthy coping techniques. Patients were asked to identify 2-3 healthy coping skills they would like to learn to use more effectively.  Therapeutic Goals Patients learned that coping is what human beings do all day long to deal with various situations in their lives Patients defined and discussed healthy vs unhealthy coping techniques Patients identified their preferred coping techniques and identified whether these were healthy or unhealthy Patients determined 2-3 healthy coping skills they would like to become more familiar with and use more often. Patients provided support and ideas to each other   Summary of Patient Progress:  Patient did not attend.    Therapeutic Modalities Cognitive Behavioral Therapy Motivational Interviewing  Eric Pineda 01/18/2024  1:43 PM

## 2024-01-18 NOTE — [facility] Suicide Risk Assessment (Signed)
 [facility] INPATIENT:  Family/Significant Other Suicide Prevention Education  Suicide Prevention Education:  Patient Refusal for Family/Significant Other Suicide Prevention Education: The patient Eric Pineda has refused to provide written consent for family/significant other to be provided Family/Significant Other Suicide Prevention Education during admission and/or prior to discharge.  Physician notified.   SPE completed with pt, as pt refused to consent to family contact. SPI pamphlet provided to pt and pt was encouraged to share information with support network, ask questions, and talk about any concerns relating to SPE. Pt denies access to guns/firearms and verbalized understanding of information provided. Mobile Crisis information also provided to pt.    Roselle Conner 01/18/2024, 11:29 AM

## 2024-01-18 NOTE — Plan of Care (Signed)
  Problem: Activity: Goal: Interest or engagement in activities will improve Outcome: Not Met (add Reason) Goal: Sleeping patterns will improve Outcome: Not Met (add Reason)   Problem: Coping: Goal: Ability to verbalize frustrations and anger appropriately will improve Outcome: Not Met (add Reason) Goal: Ability to demonstrate self-control will improve Outcome: Not Met (add Reason)   Problem: Safety: Goal: Periods of time without injury will increase Outcome: Not Met (add Reason)

## 2024-01-18 NOTE — [facility] Counselor (Signed)
 Adult Comprehensive Assessment  Patient ID: Eric Pineda, male   DOB: [birth year], 28 y.o.   MRN: [id number]  Information Source: Information source: Patient  Current Stressors:  Patient states their primary concerns and needs for treatment are:: "Just the norm." Patient states their goals for this hospitilization and ongoing recovery are:: "I don't know." Educational / Learning stressors: Patient denied. Employment / Job issues: Patient denied. Family Relationships: Patient denied. Financial / Lack of resources (include bankruptcy): Patient denied. Housing / Lack of housing: Patient denied. Physical health (include injuries & life threatening diseases): Patient denied. Social relationships: Patient denied. Substance abuse: None reported. Bereavement / Loss: Patient denied.  Living/Environment/Situation:  Living Arrangements: Parent, Other relatives Living conditions (as described by patient or guardian): WNL Who else lives in the home?: "My family." How long has patient lived in current situation?: "A long time." What is atmosphere in current home: Comfortable  Family History:  Marital status: Single Are you sexually active?: No What is your sexual orientation?: Pt reports "heterosexual" Has your sexual activity been affected by drugs, alcohol, medication, or emotional stress?: Patient denied. Does patient have children?: No  Childhood History:  By whom was/is the patient raised?: Both parents, Grandparents Description of patient's relationship with caregiver when they were a child: "It was good." Patient's description of current relationship with people who raised him/her: "Good." How were you disciplined when you got in trouble as a child/adolescent?: "Whoopings." Does patient have siblings?: Yes Number of Siblings: 3 Description of patient's current relationship with siblings: "Good" Did patient suffer any verbal/emotional/physical/sexual abuse as a child?: No Did patient  suffer from severe childhood neglect?: No Has patient ever been sexually abused/assaulted/raped as an adolescent or adult?: No Was the patient ever a victim of a crime or a disaster?: No Witnessed domestic violence?: No Has patient been affected by domestic violence as an adult?: No  Education:  Highest grade of school patient has completed: "Middle school." Currently a student?: No Learning disability?: Yes What learning problems does patient have?: 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  Employment/Work Situation:   Employment Situation: Unemployed Patient's Job has Been Impacted by Current Illness: No What is the Longest Time Patient has Held a Job?: "I dont know" Where was the Patient Employed at that Time?: "I don't know." Has Patient ever Been in the [employer]?: No  Financial Resources:   Financial resources: No income  Alcohol/Substance Abuse:   What has been your use of drugs/alcohol within the last 12 months?: Patient denies. If attempted suicide, did drugs/alcohol play a role in this?: No Alcohol/Substance Abuse Treatment Hx: Denies past history Has alcohol/substance abuse ever caused legal problems?: No  Social Support System:   Patient's Community Support System: Good Describe Community Support System: "My parents." Type of faith/religion: Patient denied. How does patient's faith help to cope with current illness?: Patient denied.  Leisure/Recreation:   Do You Have Hobbies?: No  Strengths/Needs:   What is the patient's perception of their strengths?: None reported. Patient states they can use these personal strengths during their treatment to contribute to their recovery: None reported. Patient states these barriers may affect/interfere with their treatment: Unable to assess. Patient states these barriers may affect their return to the community: None reported.  Discharge Plan:   Currently receiving community mental health services: No Patient states  concerns and preferences for aftercare planning are: Patient declines aftercare at this time. Patient states they will know when they are safe and ready for discharge when: "I don't  know." Does patient have access to transportation?: Yes Does patient have financial barriers related to discharge medications?: Yes Patient description of barriers related to discharge medications: Chart indicates that patient does not have insurance and no income reported.  Summary/Recommendations:   Summary and Recommendations (to be completed by the evaluator): Patient is a 28 year old male who presented to the ER, Under IVC by his sister. According to notes, family noticed a change in the patient's behaviors within the last 2 weeks. Family believes that current behaviors are due to substance use however during assessment patient denied substance use. Patient has a history of schizophrenia and medication noncompliance. Patient is guarded during assessment and not motivated. Patient reports that he currently resides with his parents and describes the atmosphere as "comfortable." Patient also reports receiving adequate support from his parents. Patient denies all stressors reporting that his reasons for being here are "just the norm." Patient is currently unemployed and reports no income at this time. Patient is currently not followed by a therapist or psychiatrist and declined outpatient referral. He denies SI, HI and AVH at this time. Recommendations include: crisis stabilization, therapeutic milieu, encourage group attendance and participation, medication management for mood stabilization and development of comprehensive mental wellness/sobriety plan.  Saima Monterroso M Afrah Burlison. 01/18/2024

## 2024-01-18 NOTE — Plan of Care (Signed)
   Problem: Safety: Goal: Periods of time without injury will increase Outcome: Progressing

## 2024-01-18 NOTE — Group Note (Signed)
 Recreation Therapy Group Note   Group Topic:Relaxation  Group Date: 01/18/2024 Start Time: 1000 End Time: 1050 Facilitators: Deatrice Factor, LRT, CTRS Location:  Craft Room  Group Description: PMR (Progressive Muscle Relaxation). LRT asks patients their current level of stress/anxiety from 1-10, with 10 being the highest. LRT educates patients on what PMR is and the benefits that come from it. Patients are asked to sit with their feet flat on the floor while sitting up and all the way back in their chair, if possible. LRT and pts follow a prompt through a speaker that requires you to tense and release different muscles in their body and focus on their breathing. During session, lights are off and soft music is being played. Pts are given a stress ball to use if needed. At the end of the prompt, LRT asks patients to rank their current levels of stress/anxiety from 1-10, 10 being the highest. LRT provides patients with an education handout on PMR.   Goal Area(s) Addressed:  Patients will be able to describe progressive muscle relaxation.  Patient will practice using relaxation technique. Patient will identify a new coping skill.  Patient will follow multistep directions to reduce anxiety and stress.   Affect/Mood: N/A   Participation Level: Did not attend    Clinical Observations/Individualized Feedback: Patient did not attend group.   Plan: Continue to engage patient in RT group sessions 2-3x/week.   Deatrice Factor, LRT, CTRS 01/18/2024 12:58 PM

## 2024-01-19 NOTE — Group Note (Signed)
 Date:  01/19/2024 Time:  9:21 PM  Group Topic/Focus:  Wrap-Up Group:   The focus of this group is to help patients review their daily goal of treatment and discuss progress on daily workbooks.    Participation Level:  Did Not Attend   Bradley Caffey 01/19/2024, 9:21 PM

## 2024-01-19 NOTE — Group Note (Signed)
 Date:  01/19/2024 Time:  1:04 PM  Group Topic/Focus:  Goals Group:   The focus of this group is to help patients establish daily goals to achieve during treatment and discuss how the patient can incorporate goal setting into their daily lives to aide in recovery.    Participation Level:  Did Not Attend   Mellie Sprinkle [facility] 01/19/2024, 1:04 PM

## 2024-01-19 NOTE — Plan of Care (Signed)
   Problem: Education: Goal: Emotional status will improve Outcome: Progressing Goal: Mental status will improve Outcome: Progressing   Problem: Activity: Goal: Sleeping patterns will improve Outcome: Progressing

## 2024-01-19 NOTE — Progress Notes (Signed)
 [facility] MD Progress Note  01/19/2024 5:18 PM Eric Pineda  MRN:  [id number] Subjective:  "Just regular" Principal Problem: Schizophrenia (HCC) Diagnosis: Principal Problem:   Schizophrenia (HCC) Eric Pineda is a 28 year old male who presented to the ER, under IVC. Per the patient sister [name] [phone]) they have noticed a change in his behaviors within the last two weeks. He is becoming paranoid and distant. He is walking around the house ten times and then the barn for ten times. When they call him or asked what he is doing, he will respond with an attitude or verbally aggressive. They also state, for the last two months, after he eats, he goes outside and vomits and then comes in the home and eat again. They family believes his current behaviors are due to substance use. Patient's UDS is negative for any substances. Patient has a history of schizophrenia and he refuses to take medications, as well as follow up with a provider.   Upon face-to-face evaluation, patient is seen in bed awake. He is guarded and not motivated for this interview. He is alert but partially oriented.  He appears healthy and well nourished. When asked the reason for his admission, Eric Pineda states "just regular". Patient reports he has no illness and did not need to come here. He denies SI/HI/AVH. However, he appears to be preoccupied and guarded. Patient refuse to provide additional information about his mental health history. He politely requests to be left alone and to close his door.  Patient was able to go to the dayroom for meals. He continue to pace in room and in the hallway, quiet, guarded.   Per nursing, patient is taking his Invega  PO without any resistance. Treatment Goal  includes  to administer Invega  Sustenna  IM  to manage his symptoms. Safety precautions reinforced.      Total Time spent with patient: 30 minutes  Past Psychiatric History: Schizophrenia  Past Medical History:  Past Medical History:   Diagnosis Date   Bizarre behavior 12/02/2019   History reviewed. No pertinent surgical history. Family History: History reviewed. No pertinent family history. Family Psychiatric  History: NA Social History:  Social History   Substance and Sexual Activity  Alcohol Use Never     Social History   Substance and Sexual Activity  Drug Use Yes    Social History   Socioeconomic History   Marital status: Single    Spouse name: Not on file   Number of children: Not on file   Years of education: Not on file   Highest education level: Not on file  Occupational History   Not on file  Tobacco Use   Smoking status: Never   Smokeless tobacco: Never   Tobacco comments:    Patient denied current use of tobacco  Vaping Use   Vaping status: Never Used  Substance and Sexual Activity   Alcohol use: Never   Drug use: Yes   Sexual activity: Not on file  Other Topics Concern   Not on file  Social History Narrative   Not on file   Social Drivers of Health   Financial Resource Strain: Not on file  Food Insecurity: Food Insecurity Present (01/17/2024)   Hunger Vital Sign    Worried About Running Out of Food in the Last Year: Often true    Ran Out of Food in the Last Year: Often true  Transportation Needs: No Transportation Needs (01/17/2024)   PRAPARE - Administrator, Civil Service (Medical): No  Lack of Transportation (Non-Medical): No  Physical Activity: Not on file  Stress: Not on file  Social Connections: Unknown (01/17/2024)   Social Connection and Isolation Panel [NHANES]    Frequency of Communication with Friends and Family: Patient declined    Frequency of Social Gatherings with Friends and Family: Patient declined    Attends Religious Services: Patient declined    Database administrator or Organizations: Patient declined    Attends Engineer, structural: Patient declined    Marital Status: Never married   Additional Social History:                          Sleep: Good  Appetite:  Good  Current Medications: Current Facility-Administered Medications  Medication Dose Route Frequency Provider Last Rate Last Admin   acetaminophen  (TYLENOL ) tablet 650 mg  650 mg Oral Q6H PRN Penn, Cicely, NP       alum & mag hydroxide-simeth (MAALOX/MYLANTA) 200-200-20 MG/5ML suspension 30 mL  30 mL Oral Q4H PRN Penn, Cicely, NP       benztropine  (COGENTIN ) tablet 0.5 mg  0.5 mg Oral Daily Penn, Cicely, NP   0.5 mg at 01/19/24 0813   haloperidol  (HALDOL ) tablet 5 mg  5 mg Oral TID PRN Monroe Antigua, NP       And   diphenhydrAMINE  (BENADRYL ) capsule 50 mg  50 mg Oral TID PRN Monroe Antigua, NP       haloperidol  lactate (HALDOL ) injection 10 mg  10 mg Intramuscular TID PRN Penn, Alaine Howells, NP       And   diphenhydrAMINE  (BENADRYL ) injection 50 mg  50 mg Intramuscular TID PRN Penn, Alaine Howells, NP       And   LORazepam  (ATIVAN ) injection 2 mg  2 mg Intramuscular TID PRN Penn, Alaine Howells, NP       haloperidol  lactate (HALDOL ) injection 5 mg  5 mg Intramuscular TID PRN Monroe Antigua, NP       And   diphenhydrAMINE  (BENADRYL ) injection 50 mg  50 mg Intramuscular TID PRN Penn, Alaine Howells, NP       And   LORazepam  (ATIVAN ) injection 2 mg  2 mg Intramuscular TID PRN Penn, Alaine Howells, NP       magnesium  hydroxide (MILK OF MAGNESIA) suspension 30 mL  30 mL Oral Daily PRN Penn, Cicely, NP       paliperidone  (INVEGA ) 24 hr tablet 3 mg  3 mg Oral Daily Penn, Cicely, NP   3 mg at 01/19/24 0813    Lab Results: No results found for this or any previous visit (from the past 48 hours).  Blood Alcohol level:  Lab Results  Component Value Date   [facility] <15 01/14/2024   ETH <10 01/13/2023    Metabolic Disorder Labs: Lab Results  Component Value Date   HGBA1C 5.0 01/18/2023   MPG 96.8 01/18/2023   MPG 99.67 12/03/2019   No results found for: "PROLACTIN" Lab Results  Component Value Date   CHOL 159 01/18/2023   TRIG 103 01/18/2023   HDL 57 01/18/2023   CHOLHDL 2.8 01/18/2023    VLDL 21 01/18/2023   LDLCALC 81 01/18/2023   LDLCALC 104 (H) 10/04/2020    Physical Findings: AIMS:  , ,  ,  ,    CIWA:    COWS:     Musculoskeletal: Strength & Muscle Tone: within normal limits Gait & Station: normal Patient leans: N/A  Psychiatric Specialty Exam:  Presentation  General Appearance:  Casual  Eye Contact: Minimal  Speech: Normal Rate  Speech Volume: Normal  Handedness: Right   Mood and Affect  Mood: Irritable  Affect: Inappropriate   Thought Process  Thought Processes: Irrevelant  Descriptions of Associations:Circumstantial  Orientation:Partial  Thought Content:Abstract Reasoning; Illogical; Perseveration  History of Schizophrenia/Schizoaffective disorder:Yes  Duration of Psychotic Symptoms:No data recorded Hallucinations:Hallucinations: Other (comment)  Ideas of Reference:Other (comment)  Suicidal Thoughts:Suicidal Thoughts: No  Homicidal Thoughts:Homicidal Thoughts: No   Sensorium  Memory: Immediate Poor; Recent Poor; Remote Poor  Judgment: Poor  Insight: Poor   Executive Functions  Concentration: Poor  Attention Span: Poor  Recall: Poor  Fund of Knowledge: Fair  Language: Fair   Psychomotor Activity  Psychomotor Activity: Psychomotor Activity: Restlessness   Assets  Assets: Social Support; Physical Health   Sleep  Sleep: Sleep: Good    Physical Exam: Physical Exam Vitals and nursing note reviewed.  Constitutional:      Appearance: Normal appearance. He is normal weight.  HENT:     Head: Normocephalic and atraumatic.     Right Ear: Tympanic membrane normal.     Left Ear: Tympanic membrane normal.     Nose: Nose normal.  Eyes:     Extraocular Movements: Extraocular movements intact.     Pupils: Pupils are equal, round, and reactive to light.  Cardiovascular:     Rate and Rhythm: Normal rate.     Pulses: Normal pulses.  Pulmonary:     Effort: Pulmonary effort is normal.   Musculoskeletal:        General: Normal range of motion.     Cervical back: Normal range of motion and neck supple.  Neurological:     General: No focal deficit present.     Mental Status: He is alert.    Review of Systems  Constitutional: Negative.   HENT: Negative.    Eyes: Negative.   Respiratory: Negative.    Cardiovascular: Negative.   Gastrointestinal: Negative.   Genitourinary: Negative.   Musculoskeletal: Negative.   Skin: Negative.   Neurological: Negative.   Endo/Heme/Allergies: Negative.   Psychiatric/Behavioral: Negative.     Blood pressure 112/84, pulse 72, temperature 98.2 F (36.8 C), resp. rate 20, height 5\' 11"  (1.803 m), weight 86.2 kg, SpO2 99%. Body mass index is 26.5 kg/m.   Treatment Plan Summary: Daily contact with patient to assess and evaluate symptoms and progress in treatment and Medication management  Elston Halsted, NP 01/19/2024, 5:18 PM

## 2024-01-19 NOTE — Plan of Care (Signed)
  Problem: Safety: Goal: Periods of time without injury will increase Outcome: Progressing   Problem: Physical Regulation: Goal: Ability to maintain clinical measurements within normal limits will improve Outcome: Progressing   Problem: Coping: Goal: Ability to verbalize frustrations and anger appropriately will improve Outcome: Progressing   Problem: Education: Goal: Emotional status will improve Outcome: Progressing

## 2024-01-19 NOTE — BH IP Treatment Plan (Signed)
 Interdisciplinary Treatment and Diagnostic Plan Update  01/19/2024 Time of Session: 11:00 Eric Pineda MRN: [id number]  Principal Diagnosis: Schizophrenia [facility])  Secondary Diagnoses: Principal Problem:   Schizophrenia (HCC)   Current Medications:  Current Facility-Administered Medications  Medication Dose Route Frequency Provider Last Rate Last Admin   acetaminophen  (TYLENOL ) tablet 650 mg  650 mg Oral Q6H PRN Penn, Cicely, NP       alum & mag hydroxide-simeth (MAALOX/MYLANTA) 200-200-20 MG/5ML suspension 30 mL  30 mL Oral Q4H PRN Penn, Cicely, NP       benztropine  (COGENTIN ) tablet 0.5 mg  0.5 mg Oral Daily Penn, Cicely, NP   0.5 mg at 01/19/24 0813   haloperidol  (HALDOL ) tablet 5 mg  5 mg Oral TID PRN Monroe Antigua, NP       And   diphenhydrAMINE  (BENADRYL ) capsule 50 mg  50 mg Oral TID PRN Monroe Antigua, NP       haloperidol  lactate (HALDOL ) injection 10 mg  10 mg Intramuscular TID PRN Penn, Alaine Howells, NP       And   diphenhydrAMINE  (BENADRYL ) injection 50 mg  50 mg Intramuscular TID PRN Penn, Alaine Howells, NP       And   LORazepam  (ATIVAN ) injection 2 mg  2 mg Intramuscular TID PRN Penn, Alaine Howells, NP       haloperidol  lactate (HALDOL ) injection 5 mg  5 mg Intramuscular TID PRN Monroe Antigua, NP       And   diphenhydrAMINE  (BENADRYL ) injection 50 mg  50 mg Intramuscular TID PRN Penn, Alaine Howells, NP       And   LORazepam  (ATIVAN ) injection 2 mg  2 mg Intramuscular TID PRN Penn, Alaine Howells, NP       magnesium  hydroxide (MILK OF MAGNESIA) suspension 30 mL  30 mL Oral Daily PRN Penn, Cicely, NP       paliperidone  (INVEGA ) 24 hr tablet 3 mg  3 mg Oral Daily Penn, Cicely, NP   3 mg at 01/19/24 0813   PTA Medications: No medications prior to admission.    Patient Stressors:    Patient Strengths:    Treatment Modalities: Medication Management, Group therapy, Case management,  1 to 1 session with clinician, Psychoeducation, Recreational therapy.   Physician Treatment Plan for Primary Diagnosis:  Schizophrenia (HCC) Long Term Goal(s): Improvement in symptoms so as ready for discharge   Short Term Goals: Ability to identify changes in lifestyle to reduce recurrence of condition will improve Ability to verbalize feelings will improve Ability to disclose and discuss suicidal ideas Ability to demonstrate self-control will improve Ability to identify and develop effective coping behaviors will improve Ability to maintain clinical measurements within normal limits will improve Compliance with prescribed medications will improve  Medication Management: Evaluate patient's response, side effects, and tolerance of medication regimen.  Therapeutic Interventions: 1 to 1 sessions, Unit Group sessions and Medication administration.  Evaluation of Outcomes: Not Met  Physician Treatment Plan for Secondary Diagnosis: Principal Problem:   Schizophrenia (HCC)  Long Term Goal(s): Improvement in symptoms so as ready for discharge   Short Term Goals: Ability to identify changes in lifestyle to reduce recurrence of condition will improve Ability to verbalize feelings will improve Ability to disclose and discuss suicidal ideas Ability to demonstrate self-control will improve Ability to identify and develop effective coping behaviors will improve Ability to maintain clinical measurements within normal limits will improve Compliance with prescribed medications will improve     Medication Management: Evaluate patient's response, side effects, and tolerance of medication regimen.  Therapeutic Interventions: 1 to 1 sessions, Unit Group sessions and Medication administration.  Evaluation of Outcomes: Not Met   RN Treatment Plan for Primary Diagnosis: Schizophrenia (HCC) Long Term Goal(s): Knowledge of disease and therapeutic regimen to maintain health will improve  Short Term Goals: Ability to remain free from injury will improve, Ability to verbalize frustration and anger appropriately will  improve, Ability to demonstrate self-control, Ability to participate in decision making will improve, Ability to verbalize feelings will improve, Ability to disclose and discuss suicidal ideas, Ability to identify and develop effective coping behaviors will improve, and Compliance with prescribed medications will improve  Medication Management: RN will administer medications as ordered by provider, will assess and evaluate patient's response and provide education to patient for prescribed medication. RN will report any adverse and/or side effects to prescribing provider.  Therapeutic Interventions: 1 on 1 counseling sessions, Psychoeducation, Medication administration, Evaluate responses to treatment, Monitor vital signs and CBGs as ordered, Perform/monitor CIWA, COWS, AIMS and Fall Risk screenings as ordered, Perform wound care treatments as ordered.  Evaluation of Outcomes: Not Met   LCSW Treatment Plan for Primary Diagnosis: Schizophrenia (HCC) Long Term Goal(s): Safe transition to appropriate next level of care at discharge, Engage patient in therapeutic group addressing interpersonal concerns.  Short Term Goals: Engage patient in aftercare planning with referrals and resources, Increase social support, Increase ability to appropriately verbalize feelings, Increase emotional regulation, Facilitate acceptance of mental health diagnosis and concerns, Identify triggers associated with mental health/substance abuse issues, and Increase skills for wellness and recovery  Therapeutic Interventions: Assess for all discharge needs, 1 to 1 time with Social worker, Explore available resources and support systems, Assess for adequacy in community support network, Educate family and significant other(s) on suicide prevention, Complete Psychosocial Assessment, Interpersonal group therapy.  Evaluation of Outcomes: Not Met   Progress in Treatment: Attending groups: No. Participating in groups: No. Taking  medication as prescribed: Yes. Toleration medication: Yes. Family/Significant other contact made: No, will contact:  if given permission.  Patient understands diagnosis: Yes. Discussing patient identified problems/goals with staff: No. Medical problems stabilized or resolved: Yes. Denies suicidal/homicidal ideation: No. Issues/concerns per patient self-inventory: No. Other: none.  New problem(s) identified: No, Describe:  none identified.   New Short Term/Long Term Goal(s): elimination of symptoms of psychosis, medication management for mood stabilization; elimination of SI thoughts; development of comprehensive mental wellness/sobriety plan.  Patient Goals:  Pt declined to attend treatment team meeting despite personal invitation my staff.   Discharge Plan or Barriers: CSW will assist pt with development of an appropriate aftercare/discharge plan.   Reason for Continuation of Hospitalization: Aggression Anxiety Medication stabilization  Estimated Length of Stay: 1-7 days  Last 3 [place] Suicide Severity Risk Score: Flowsheet Row Admission (Current) from 01/17/2024 in [facility] INPATIENT BEHAVIORAL MEDICINE ED from 01/14/2024 in [facility] Emergency Department at [facility] Admission (Discharged) from 01/14/2023 in [facility] INPATIENT BEHAVIORAL MEDICINE  C-SSRS RISK CATEGORY No Risk No Risk No Risk       Last PHQ 2/9 Scores:     No data to display          Scribe for Treatment Team: Randolm Butte, Buzz Cass 01/19/2024 2:34 PM

## 2024-01-19 NOTE — Progress Notes (Signed)
   01/19/24 1000  Psych Admission Type (Psych Patients Only)  Admission Status Involuntary  Psychosocial Assessment  Patient Complaints None  Eye Contact Brief  Facial Expression Other (Comment) (wnl)  Affect Appropriate to circumstance  Speech Soft  Interaction Guarded;Minimal  Motor Activity Slow  Appearance/Hygiene Unremarkable  Behavior Characteristics Cooperative;Guarded  Mood Anxious;Pleasant  Thought Process  Coherency WDL  Content WDL  Delusions None reported or observed  Perception WDL  Hallucination None reported or observed  Judgment Impaired  Confusion WDL  Danger to Self  Current suicidal ideation? Denies  Agreement Not to Harm Self Yes  Description of Agreement verbal  Danger to Others  Danger to Others None reported or observed   Noted patient pacing in the hallway talking to himself. Patient states " I am good " for any questions. Patient isolates to his room most of the shift. Support and encouragement given.

## 2024-01-19 NOTE — Group Note (Signed)
 LCSW Group Therapy Note  Group Date: 01/19/2024 Start Time: 1300 End Time: 1350   Type of Therapy and Topic:  Group Therapy: Anger Cues and Responses  Participation Level:  Did Not Attend   Description of Group:   In this group, patients learned how to recognize the physical, cognitive, emotional, and behavioral responses they have to anger-provoking situations.  They identified a recent time they became angry and how they reacted.  They analyzed how their reaction was possibly beneficial and how it was possibly unhelpful.  The group discussed a variety of healthier coping skills that could help with such a situation in the future.  Focus was placed on how helpful it is to recognize the underlying emotions to our anger, because working on those can lead to a more permanent solution as well as our ability to focus on the important rather than the urgent.  Therapeutic Goals: Patients will remember their last incident of anger and how they felt emotionally and physically, what their thoughts were at the time, and how they behaved. Patients will identify how their behavior at that time worked for them, as well as how it worked against them. Patients will explore possible new behaviors to use in future anger situations. Patients will learn that anger itself is normal and cannot be eliminated, and that healthier reactions can assist with resolving conflict rather than worsening situations.  Summary of Patient Progress:   X  Therapeutic Modalities:   Cognitive Behavioral Therapy    Larri Ply, LCSW 01/19/2024  3:18 PM

## 2024-01-19 NOTE — Group Note (Signed)
 Date:  01/19/2024 Time:  6:27 PM  Group Topic/Focus:  Healthy Communication:   The focus of this group is to discuss communication, barriers to communication, as well as healthy ways to communicate with others.    Participation Level:  Did Not Attend   Eric Pineda [facility] 01/19/2024, 6:27 PM

## 2024-01-19 NOTE — Progress Notes (Signed)
 Pt calm and pleasant during assessment denying SI/HI/AVH. Pt observed by this Clinical research associate interacting appropriately with staff and peers on the unit. Pt didn't have any scheduled medications tonight and hasn't requested anything PRN as of now. Pt given education, support, and encouragement to be active in his treatment plan. Pt being monitored Q 15 minutes for safety per unit protocol, remains safe on the unit

## 2024-01-20 ENCOUNTER — Encounter: Payer: Self-pay | Admitting: Psychiatric/Mental Health

## 2024-01-20 MED ORDER — PALIPERIDONE PALMITATE ER 234 MG/1.5ML IM SUSY
234.0000 mg | PREFILLED_SYRINGE | Freq: Once | INTRAMUSCULAR | Status: AC
  Administered 2024-01-20: 234 mg via INTRAMUSCULAR
  Filled 2024-01-20: qty 1.5

## 2024-01-20 NOTE — Plan of Care (Signed)
   Problem: Education: Goal: Emotional status will improve Outcome: Progressing

## 2024-01-20 NOTE — Progress Notes (Signed)
 [name] MD Progress Note  01/20/2024 6:06 PM Eric Pineda  MRN:  [id number] Subjective:  "just regular" Principal Problem: Schizophrenia (HCC) Diagnosis: Principal Problem:   Schizophrenia (HCC)  Eric Pineda is a 28 year old male who presented to the ER, under IVC. Per the patient sister Eric Pineda [phone]) they have noticed a change in his behaviors within the last two weeks. He is becoming paranoid and distant. He is walking around the house ten times and then the barn for ten times. When they call him or asked what he is doing, he will respond with an attitude or verbally aggressive. They also state, for the last two months, after he eats, he goes outside and vomits and then comes in the home and eat again. They family believes his current behaviors are due to substance use. Patient's UDS is negative for any substances. Patient has a history of schizophrenia and he refuses to take medications, as well as follow up with a provider.   Upon face-to-face evaluation, patient is seen pacing in the hallway. He is guarded and not motivated for this interview. He is alert but partially oriented.  He appears healthy and well nourished. When asked the reason for his admission, Eric Pineda states "just regular". Patient reports he has no illness and did not need to come here. He denies SI/HI/AVH. However, he appears to be preoccupied and guarded. Patient refuse to provide additional information about his mental health history. He politely requests to be left alone and to close his door.  Patient was able to go to the dayroom for meals.   Patient has been taking his medications. He agrees to take a LLA. Invega  sustenna 234 mg IM prescribed. Patient is guarded and reserved with no aggressive behaviors. Safety precautions maintained.     Total Time spent with patient: 30 minutes  Past Psychiatric History: Schizophrenia  Past Medical History:  Past Medical History:  Diagnosis Date   Bizarre behavior 12/02/2019    History reviewed. No pertinent surgical history. Family History: History reviewed. No pertinent family history. Family Psychiatric  History: NA Social History:  Social History   Substance and Sexual Activity  Alcohol Use Never     Social History   Substance and Sexual Activity  Drug Use Yes    Social History   Socioeconomic History   Marital status: Single    Spouse name: Not on file   Number of children: Not on file   Years of education: Not on file   Highest education level: Not on file  Occupational History   Not on file  Tobacco Use   Smoking status: Never   Smokeless tobacco: Never   Tobacco comments:    Patient denied current use of tobacco  Vaping Use   Vaping status: Never Used  Substance and Sexual Activity   Alcohol use: Never   Drug use: Yes   Sexual activity: Not on file  Other Topics Concern   Not on file  Social History Narrative   Not on file   Social Drivers of Health   Financial Resource Strain: Not on file  Food Insecurity: Food Insecurity Present (01/17/2024)   Hunger Vital Sign    Worried About Running Out of Food in the Last Year: Often true    Ran Out of Food in the Last Year: Often true  Transportation Needs: No Transportation Needs (01/17/2024)   PRAPARE - Administrator, Civil Service (Medical): No    Lack of Transportation (Non-Medical): No  Physical Activity: Not  on file  Stress: Not on file  Social Connections: Unknown (01/17/2024)   Social Connection and Isolation Panel [NHANES]    Frequency of Communication with Friends and Family: Patient declined    Frequency of Social Gatherings with Friends and Family: Patient declined    Attends Religious Services: Patient declined    Database administrator or Organizations: Patient declined    Attends Engineer, structural: Patient declined    Marital Status: Never married   Additional Social History:                         Sleep: Good  Appetite:   Good  Current Medications: Current Facility-Administered Medications  Medication Dose Route Frequency Provider Last Rate Last Admin   acetaminophen  (TYLENOL ) tablet 650 mg  650 mg Oral Q6H PRN Penn, Cicely, NP       alum & mag hydroxide-simeth (MAALOX/MYLANTA) 200-200-20 MG/5ML suspension 30 mL  30 mL Oral Q4H PRN Penn, Cicely, NP       benztropine  (COGENTIN ) tablet 0.5 mg  0.5 mg Oral Daily Penn, Cicely, NP   0.5 mg at 01/20/24 5409   haloperidol  (HALDOL ) tablet 5 mg  5 mg Oral TID PRN Monroe Antigua, NP       And   diphenhydrAMINE  (BENADRYL ) capsule 50 mg  50 mg Oral TID PRN Monroe Antigua, NP       haloperidol  lactate (HALDOL ) injection 10 mg  10 mg Intramuscular TID PRN Penn, Alaine Howells, NP       And   diphenhydrAMINE  (BENADRYL ) injection 50 mg  50 mg Intramuscular TID PRN Penn, Alaine Howells, NP       And   LORazepam  (ATIVAN ) injection 2 mg  2 mg Intramuscular TID PRN Penn, Alaine Howells, NP       haloperidol  lactate (HALDOL ) injection 5 mg  5 mg Intramuscular TID PRN Penn, Alaine Howells, NP       And   diphenhydrAMINE  (BENADRYL ) injection 50 mg  50 mg Intramuscular TID PRN Penn, Alaine Howells, NP       And   LORazepam  (ATIVAN ) injection 2 mg  2 mg Intramuscular TID PRN Penn, Alaine Howells, NP       magnesium  hydroxide (MILK OF MAGNESIA) suspension 30 mL  30 mL Oral Daily PRN Penn, Cicely, NP       paliperidone  (INVEGA ) 24 hr tablet 3 mg  3 mg Oral Daily Penn, Cicely, NP   3 mg at 01/20/24 8119    Lab Results: No results found for this or any previous visit (from the past 48 hours).  Blood Alcohol level:  Lab Results  Component Value Date   [facility] <15 01/14/2024   ETH <10 01/13/2023    Metabolic Disorder Labs: Lab Results  Component Value Date   HGBA1C 5.0 01/18/2023   MPG 96.8 01/18/2023   MPG 99.67 12/03/2019   No results found for: "PROLACTIN" Lab Results  Component Value Date   CHOL 159 01/18/2023   TRIG 103 01/18/2023   HDL 57 01/18/2023   CHOLHDL 2.8 01/18/2023   VLDL 21 01/18/2023   LDLCALC 81  01/18/2023   LDLCALC 104 (H) 10/04/2020    Physical Findings: AIMS:  , ,  ,  ,    CIWA:    COWS:     Musculoskeletal: Strength & Muscle Tone: within normal limits Gait & Station: normal Patient leans: N/A  Psychiatric Specialty Exam:  Presentation  General Appearance:  Casual  Eye Contact: Minimal  Speech: Normal Rate  Speech Volume: Normal  Handedness: Right   Mood and Affect  Mood: Anxious (pacing)  Affect: Constricted   Thought Process  Thought Processes: Irrevelant  Descriptions of Associations:Circumstantial  Orientation:Partial  Thought Content:Abstract Reasoning; Illogical  History of Schizophrenia/Schizoaffective disorder:Yes  Duration of Psychotic Symptoms:No data recorded Hallucinations:Hallucinations: None  Ideas of Reference:None  Suicidal Thoughts:Suicidal Thoughts: No  Homicidal Thoughts:Homicidal Thoughts: No   Sensorium  Memory: Immediate Fair; Recent Poor; Remote Poor  Judgment: Poor  Insight: Poor   Executive Functions  Concentration: Poor  Attention Span: Fair  Recall: Poor  Fund of Knowledge: Fair  Language: Fair   Psychomotor Activity  Psychomotor Activity: Psychomotor Activity: Restlessness   Assets  Assets: Social Support; Physical Health; Housing   Sleep  Sleep: Sleep: Good    Physical Exam: Physical Exam Vitals and nursing note reviewed.  Constitutional:      Appearance: Normal appearance.  HENT:     Head: Normocephalic and atraumatic.     Right Ear: Tympanic membrane normal.     Left Ear: Tympanic membrane normal.     Nose: Nose normal.     Mouth/Throat:     Mouth: Mucous membranes are moist.  Eyes:     Extraocular Movements: Extraocular movements intact.     Pupils: Pupils are equal, round, and reactive to light.  Cardiovascular:     Rate and Rhythm: Normal rate.     Pulses: Normal pulses.  Pulmonary:     Effort: Pulmonary effort is normal.  Musculoskeletal:         General: Normal range of motion.     Cervical back: Normal range of motion and neck supple.  Neurological:     General: No focal deficit present.     Mental Status: He is alert.    Review of Systems  Constitutional: Negative.   HENT: Negative.    Eyes: Negative.   Respiratory: Negative.    Cardiovascular: Negative.   Gastrointestinal: Negative.   Genitourinary: Negative.   Musculoskeletal: Negative.   Skin: Negative.   Neurological: Negative.   Endo/Heme/Allergies: Negative.   Psychiatric/Behavioral:  The patient is nervous/anxious.    Blood pressure 122/86, pulse 68, temperature (!) 97.3 F (36.3 C), resp. rate 20, height 5\' 11"  (1.803 m), weight 86.2 kg, SpO2 100%. Body mass index is 26.5 kg/m.   Treatment Plan Summary: Daily contact with patient to assess and evaluate symptoms and progress in treatment and Medication management  Elston Halsted, NP 01/20/2024, 6:06 PM

## 2024-01-20 NOTE — Group Note (Signed)
 Date:  01/20/2024 Time:  8:58 PM  Group Topic/Focus:  Wrap-Up Group:   The focus of this group is to help patients review their daily goal of treatment and discuss progress on daily workbooks.    Participation Level:  Did Not Attend  Participation Quality:   none  Affect:   none  Cognitive:   none  Insight: None  Engagement in Group:   none  Modes of Intervention:   none  Additional Comments:  none   Fabiola Holy 01/20/2024, 8:58 PM

## 2024-01-20 NOTE — Group Note (Signed)
 [facility] LCSW Group Therapy Note   Group Date: 01/20/2024 Start Time: 1300 End Time: 1345   Type of Therapy/Topic:  Group Therapy:  Balance in Life  Participation Level:  Did Not Attend   Description of Group:    This group will address the concept of balance and how it feels and looks when one is unbalanced. Patients will be encouraged to process areas in their lives that are out of balance, and identify reasons for remaining unbalanced. Facilitators will guide patients utilizing problem- solving interventions to address and correct the stressor making their life unbalanced. Understanding and applying boundaries will be explored and addressed for obtaining  and maintaining a balanced life. Patients will be encouraged to explore ways to assertively make their unbalanced needs known to significant others in their lives, using other group members and facilitator for support and feedback.  Therapeutic Goals: Patient will identify two or more emotions or situations they have that consume much of in their lives. Patient will identify signs/triggers that life has become out of balance:  Patient will identify two ways to set boundaries in order to achieve balance in their lives:  Patient will demonstrate ability to communicate their needs through discussion and/or role plays  Summary of Patient Progress: Patient did not attend group.    Therapeutic Modalities:   Cognitive Behavioral Therapy Solution-Focused Therapy Assertiveness Training   Randolm Butte, LCSW

## 2024-01-20 NOTE — Progress Notes (Signed)
 Nursing Shift Note:  1900-0700  Attended Evening Group: No Medication Compliant:  n/a no scheduled medications Behavior: isolative Sleep Quality: good Significant Changes: none noted.   01/20/24 2300  Psych Admission Type (Psych Patients Only)  Admission Status Involuntary  Psychosocial Assessment  Patient Complaints None  Eye Contact Brief  Facial Expression Flat  Affect Flat  Speech Logical/coherent  Interaction Minimal  Motor Activity Pacing  Appearance/Hygiene Unremarkable  Behavior Characteristics Cooperative  Mood Pleasant  Thought Process  Coherency WDL  Content WDL  Delusions None reported or observed  Perception WDL  Hallucination None reported or observed  Judgment Impaired  Confusion None  Danger to Self  Current suicidal ideation? Denies  Danger to Others  Danger to Others None reported or observed

## 2024-01-20 NOTE — Group Note (Signed)
 Date:  01/20/2024 Time:  10:13 AM  Group Topic/Focus:  Spirituality:   The focus of this group is to discuss how one's spirituality can aide in recovery.    Participation Level:  Did Not Attend   Marianna Shirk Vannie Hochstetler 01/20/2024, 10:13 AM

## 2024-01-20 NOTE — Group Note (Signed)
 Date:  01/20/2024 Time:  6:37 PM  Group Topic/Focus:  Activity Group: The focus of the group is to promote activity for the patients and encourage them to go outside to the courtyard and get some fresh air and some exercise.    Participation Level:  Active  Participation Quality:  Appropriate  Affect:  Appropriate  Cognitive:  Appropriate  Insight: Appropriate  Engagement in Group:  Engaged  Modes of Intervention:  Activity  Additional Comments:    Marianna Shirk Elania Crowl 01/20/2024, 6:37 PM

## 2024-01-20 NOTE — Group Note (Signed)
 Recreation Therapy Group Note   Group Topic:Goal Setting  Group Date: 01/20/2024 Start Time: 1000 End Time: 1100 Facilitators: Deatrice Factor, LRT, CTRS Location:  Craft Room  Group Description: Product/process development scientist. Patients were given many different magazines, a glue stick, markers, and a piece of cardstock paper. LRT and pts discussed the importance of having goals in life. LRT and pts discussed the difference between short-term and long-term goals, as well as what a SMART goal is. LRT encouraged pts to create a vision board, with images they picked and then cut out with safety scissors from the magazine, for themselves, that capture their short and long-term goals. LRT encouraged pts to show and explain their vision board to the group.   Goal Area(s) Addressed:  Patient will gain knowledge of short vs. long term goals.  Patient will identify goals for themselves. Patient will practice setting SMART goals. Patient will verbalize their goals to LRT and peers.   Affect/Mood: N/A   Participation Level: Did not attend    Clinical Observations/Individualized Feedback: Patient did not attend group.   Plan: Continue to engage patient in RT group sessions 2-3x/week.   Deatrice Factor, LRT, CTRS 01/20/2024 1:40 PM

## 2024-01-20 NOTE — Plan of Care (Signed)
  Problem: Education: Goal: Verbalization of understanding the information provided will improve Outcome: Progressing   Problem: Coping: Goal: Ability to demonstrate self-control will improve Outcome: Progressing   Problem: Health Behavior/Discharge Planning: Goal: Compliance with treatment plan for underlying cause of condition will improve Outcome: Progressing   Problem: Safety: Goal: Periods of time without injury will increase Outcome: Progressing

## 2024-01-20 NOTE — Progress Notes (Signed)
 Patient presents with flat affect and very minimal interaction. Patient states he slept fine and denies SI,HI, and A/V/H with no plan or intent. Patient has not attended groups today thus far. Patient is med compliant and remains cooperative in unit.

## 2024-01-20 NOTE — Plan of Care (Signed)
   Problem: Education: Goal: Emotional status will improve Outcome: Progressing Goal: Mental status will improve Outcome: Progressing

## 2024-01-21 MED ORDER — PALIPERIDONE PALMITATE ER 156 MG/ML IM SUSY: 156.0000 mg | PREFILLED_SYRINGE | Freq: Once | INTRAMUSCULAR | Status: DC

## 2024-01-21 MED ORDER — PALIPERIDONE PALMITATE ER 156 MG/ML IM SUSY
156.0000 mg | PREFILLED_SYRINGE | INTRAMUSCULAR | Status: DC
  Administered 2024-01-27: 156 mg via INTRAMUSCULAR
  Filled 2024-01-21 (×2): qty 1

## 2024-01-21 NOTE — Group Note (Signed)
 Date:  01/21/2024 Time:  7:04 PM  Group Topic/Focus:  Activity Group: The focus of the group is to promote activity for the patients and encourage them to go outside to the courtyard and get some fresh air and some exercise.    Participation Level:  Active  Participation Quality:  Appropriate  Affect:  Appropriate  Cognitive:  Appropriate  Insight: Appropriate  Engagement in Group:  Engaged  Modes of Intervention:  Activity  Additional Comments:    [name] 01/21/2024, 7:04 PM

## 2024-01-21 NOTE — Progress Notes (Signed)
   01/21/24 0900  Psych Admission Type (Psych Patients Only)  Admission Status Involuntary  Psychosocial Assessment  Patient Complaints None  Eye Contact Brief  Facial Expression Flat  Affect Flat  Speech Logical/coherent  Interaction Guarded;Minimal  Motor Activity Pacing  Appearance/Hygiene In scrubs  Behavior Characteristics Pacing;Guarded  Mood Pleasant  Thought Process  Coherency Other (Comment) (pt provided  minimum reponcse)  Content UTA  Delusions None reported or observed  Perception UTA  Hallucination None reported or observed  Judgment Impaired  Confusion UTA  Danger to Self  Current suicidal ideation? Denies  Agreement Not to Harm Self Yes  Description of Agreement Verbal

## 2024-01-21 NOTE — Plan of Care (Signed)
  Problem: Education: Goal: Emotional status will improve Outcome: Progressing Goal: Mental status will improve Outcome: Progressing   Problem: Health Behavior/Discharge Planning: Goal: Compliance with treatment plan for underlying cause of condition will improve Outcome: Progressing   

## 2024-01-21 NOTE — Group Note (Signed)
 Recreation Therapy Group Note   Group Topic:Leisure Education  Group Date: 01/21/2024 Start Time: 1040 End Time: 1155 Facilitators: Deatrice Factor, LRT, CTRS Location:  Craft Room  Group Description: Leisure. Patients were given the option to choose from singing karaoke, coloring mandalas, using oil pastels, journaling, or playing with play-doh. LRT and pts discussed the meaning of leisure, the importance of participating in leisure during their free time/when they're outside of the hospital, as well as how our leisure interests can also serve as coping skills.   Goal Area(s) Addressed:  Patient will identify a current leisure interest.  Patient will learn the definition of "leisure". Patient will practice making a positive decision. Patient will have the opportunity to try a new leisure activity. Patient will communicate with peers and LRT.    Affect/Mood: Appropriate   Participation Level: Minimal    Clinical Observations/Individualized Feedback: Lynder Sanger was originally present in group. Pt shared that he likes to workout in his free time. Pt got up and left after being present for 5 minutes. Pt did not return.   Plan: Continue to engage patient in RT group sessions 2-3x/week.   Deatrice Factor, LRT, CTRS 01/21/2024 1:20 PM

## 2024-01-21 NOTE — Progress Notes (Addendum)
 [facility] MD Progress Note  01/21/2024 7:41 PM Eric Pineda  MRN:  [id number] Subjective:  UTA Principal Problem: Schizophrenia (HCC) Diagnosis: Principal Problem:   Schizophrenia (HCC)   Eric Pineda is a 28 year old male who presented to the ER, under IVC. Per the patient sister Eric Pineda [phone]) they have noticed a change in his behaviors within the last two weeks. He is becoming paranoid and distant. He is walking around the house ten times and then the barn for ten times. When they call him or asked what he is doing, he will respond with an attitude or verbally aggressive. They also state, for the last two months, after he eats, he goes outside and vomits and then comes in the home and eat again. They family believes his current behaviors are due to substance use. Patient's UDS is negative for any substances. Patient has a history of schizophrenia and he refuses to take medications, as well as follow up with a provider.   Upon face-to-face evaluation, patient is seen pacing in the hallway. He is guarded and not motivated for this interview. He is alert but partially oriented.  He appears healthy and well nourished. When asked the reason for his admission,  he turn away and  continues to pace. He laughs alone and shakes his head.  Patient reports he has no illness and did not need to come here. However, he accepted the injection yesterday. He continues to take PO medications. He denies SI/HI/AVH. However, he appears to be preoccupied and guarded. Patient refuse to provide additional information about his mental health history. He politely leave the scene and keeps pacing. Invega  Sustenna 156 mg to be administered on 01/24/2024 then Q 28 days.     Total Time spent with patient: 30 minutes  Past Psychiatric History: Schizophrenia  Past Medical History:  Past Medical History:  Diagnosis Date   Bizarre behavior 12/02/2019   History reviewed. No pertinent surgical history. Family History:  History reviewed. No pertinent family history. Family Psychiatric  History: NA Social History:  Social History   Substance and Sexual Activity  Alcohol Use Never     Social History   Substance and Sexual Activity  Drug Use Yes    Social History   Socioeconomic History   Marital status: Single    Spouse name: Not on file   Number of children: Not on file   Years of education: Not on file   Highest education level: Not on file  Occupational History   Not on file  Tobacco Use   Smoking status: Never   Smokeless tobacco: Never   Tobacco comments:    Patient denied current use of tobacco  Vaping Use   Vaping status: Never Used  Substance and Sexual Activity   Alcohol use: Never   Drug use: Yes   Sexual activity: Not on file  Other Topics Concern   Not on file  Social History Narrative   Not on file   Social Drivers of Health   Financial Resource Strain: Not on file  Food Insecurity: Food Insecurity Present (01/17/2024)   Hunger Vital Sign    Worried About Running Out of Food in the Last Year: Often true    Ran Out of Food in the Last Year: Often true  Transportation Needs: No Transportation Needs (01/17/2024)   PRAPARE - Administrator, Civil Service (Medical): No    Lack of Transportation (Non-Medical): No  Physical Activity: Not on file  Stress: Not on file  Social  Connections: Unknown (01/17/2024)   Social Connection and Isolation Panel [NHANES]    Frequency of Communication with Friends and Family: Patient declined    Frequency of Social Gatherings with Friends and Family: Patient declined    Attends Religious Services: Patient declined    Database administrator or Organizations: Patient declined    Attends Engineer, structural: Patient declined    Marital Status: Never married   Additional Social History:                         Sleep: Good  Appetite:  Good  Current Medications: Current Facility-Administered Medications   Medication Dose Route Frequency Provider Last Rate Last Admin   acetaminophen  (TYLENOL ) tablet 650 mg  650 mg Oral Q6H PRN Eric Pineda       alum & mag hydroxide-simeth (MAALOX/MYLANTA) 200-200-20 MG/5ML suspension 30 mL  30 mL Oral Q4H PRN Eric Pineda       benztropine  (COGENTIN ) tablet 0.5 mg  0.5 mg Oral Daily Eric Pineda   0.5 mg at 01/21/24 0847   haloperidol  (HALDOL ) tablet 5 mg  5 mg Oral TID PRN Eric Antigua, Pineda       And   diphenhydrAMINE  (BENADRYL ) capsule 50 mg  50 mg Oral TID PRN Eric Antigua, Pineda       haloperidol  lactate (HALDOL ) injection 10 mg  10 mg Intramuscular TID PRN Eric Pineda       And   diphenhydrAMINE  (BENADRYL ) injection 50 mg  50 mg Intramuscular TID PRN Eric Pineda       And   LORazepam  (ATIVAN ) injection 2 mg  2 mg Intramuscular TID PRN Eric Pineda       haloperidol  lactate (HALDOL ) injection 5 mg  5 mg Intramuscular TID PRN Eric Pineda       And   diphenhydrAMINE  (BENADRYL ) injection 50 mg  50 mg Intramuscular TID PRN Eric Pineda       And   LORazepam  (ATIVAN ) injection 2 mg  2 mg Intramuscular TID PRN Eric Pineda       magnesium  hydroxide (MILK OF MAGNESIA) suspension 30 mL  30 mL Oral Daily PRN Eric Pineda       [START ON 01/24/2024] paliperidone  (INVEGA  SUSTENNA) injection 156 mg  156 mg Intramuscular Q28 days Eric Ikard Pineda, Pineda       paliperidone  (INVEGA ) 24 hr tablet 3 mg  3 mg Oral Daily Eric Pineda   3 mg at 01/21/24 0848    Lab Results: No results found for this or any previous visit (from the past 48 hours).  Blood Alcohol level:  Lab Results  Component Value Date   [facility] <15 01/14/2024   ETH <10 01/13/2023    Metabolic Disorder Labs: Lab Results  Component Value Date   HGBA1C 5.0 01/18/2023   MPG 96.8 01/18/2023   MPG 99.67 12/03/2019   No results found for: "PROLACTIN" Lab Results  Component Value Date   CHOL 159 01/18/2023   TRIG 103 01/18/2023   HDL 57 01/18/2023    CHOLHDL 2.8 01/18/2023   VLDL 21 01/18/2023   LDLCALC 81 01/18/2023   LDLCALC 104 (H) 10/04/2020    Physical Findings: AIMS:  , ,  ,  ,    CIWA:    COWS:     Musculoskeletal: Strength & Muscle Tone: within normal limits Gait & Station: normal Patient leans: N/A  Psychiatric Specialty  Exam:  Presentation  General Appearance:  Casual  Eye Contact: Minimal  Speech: Other (comment) (selectively mute)  Speech Volume: Normal  Handedness: Right   Mood and Affect  Mood: Anxious (pacing)  Affect: Constricted   Thought Process  Thought Processes: Irrevelant  Descriptions of Associations:Circumstantial  Orientation:Partial  Thought Content:Abstract Reasoning  History of Schizophrenia/Schizoaffective disorder:Yes  Duration of Psychotic Symptoms:No data recorded Hallucinations:Hallucinations: None  Ideas of Reference:None  Suicidal Thoughts:Suicidal Thoughts: No  Homicidal Thoughts:Homicidal Thoughts: No   Sensorium  Memory: Immediate Fair; Recent Poor; Remote Poor  Judgment: Poor  Insight: Poor   Executive Functions  Concentration: Poor  Attention Span: Fair  Recall: Poor  Fund of Knowledge: Fair  Language: Fair   Psychomotor Activity  Psychomotor Activity: Psychomotor Activity: Restlessness   Assets  Assets: Social Support; Physical Health; Housing   Sleep  Sleep: Sleep: Good    Physical Exam: Physical Exam Vitals and nursing note reviewed.  HENT:     Mouth/Throat:     Mouth: Mucous membranes are moist.  Eyes:     Extraocular Movements: Extraocular movements intact.     Pupils: Pupils are equal, round, and reactive to light.  Cardiovascular:     Rate and Rhythm: Normal rate.     Pulses: Normal pulses.  Pulmonary:     Effort: Pulmonary effort is normal.  Musculoskeletal:        General: Normal range of motion.     Cervical back: Normal range of motion and neck supple.  Neurological:     General: No  focal deficit present.     Mental Status: He is alert.    Review of Systems  Constitutional: Negative.   HENT: Negative.    Eyes: Negative.   Cardiovascular: Negative.   Gastrointestinal: Negative.   Genitourinary: Negative.   Musculoskeletal: Negative.   Skin: Negative.   Neurological: Negative.   Endo/Heme/Allergies: Negative.   Psychiatric/Behavioral:  The patient is nervous/anxious.    Blood pressure 107/75, pulse 68, temperature 98.1 F (36.7 C), resp. rate 16, height 5\' 11"  (1.803 Pineda), weight 86.2 kg, SpO2 99%. Body mass index is 26.5 kg/Pineda.   Treatment Plan Summary: Daily contact with patient to assess and evaluate symptoms and progress in treatment and Medication management  Elston Halsted, Pineda 01/21/2024, 7:41 PM

## 2024-01-21 NOTE — Plan of Care (Signed)

## 2024-01-22 DIAGNOSIS — F200 Paranoid schizophrenia: Secondary | ICD-10-CM | POA: Diagnosis not present

## 2024-01-22 NOTE — Group Note (Signed)
 Date:  01/22/2024 Time:  4:17 PM  Group Topic/Focus:  Rediscovering Joy:   The focus of this group is to explore various ways to relieve stress in a positive manner.    Participation Level:  Did Not Attend   Mellie Sprinkle [facility] 01/22/2024, 4:17 PM

## 2024-01-22 NOTE — Progress Notes (Signed)
 [facility] MD Progress Note  01/22/2024 2:18 PM Eric Pineda  MRN:  [id number] Subjective:  UTA Principal Problem: Schizophrenia Eric Pineda) Diagnosis: Principal Problem:   Schizophrenia (HCC)  Assessment and plan schizophrenia paranoid plan to continue current medication and give Invega  Sustenna 156 mg on 5 5. Eric Pineda is a 28 year old male who presented to the ER, under IVC. Per the patient sister Eric Pineda [phone]) they have noticed a change in his behaviors within the last two weeks. He is becoming paranoid and distant. He is walking around the house ten times and then the barn for ten times. When they call him or asked what he is doing, he will respond with an attitude or verbally aggressive. They also state, for the last two months, after he eats, he goes outside and vomits and then comes in the home and eat again. They family believes his current behaviors are due to substance use. Patient's UDS is negative for any substances. Patient has a history of schizophrenia and he refuses to take medications, as well as follow up with a provider.   Upon face-to-face evaluation, patient continues to minimize behaviors he says that he came in for normal checkup, he is very evasive, appears internally preoccupied, minimizes behaviors, no aggressive behavior here.  No side effects reported.  Invega  Sustenna 156 mg to be administered on 01/24/2024 then Q 28 days.     Total Time spent with patient: 30 minutes  Past Psychiatric History: Schizophrenia  Past Medical History:  Past Medical History:  Diagnosis Date   Bizarre behavior 12/02/2019   History reviewed. No pertinent surgical history. Family History: History reviewed. No pertinent family history. Family Psychiatric  History: NA Social History:  Social History   Substance and Sexual Activity  Alcohol Use Never     Social History   Substance and Sexual Activity  Drug Use Yes    Social History   Socioeconomic History   Marital status:  Single    Spouse name: Not on file   Number of children: Not on file   Years of education: Not on file   Highest education level: Not on file  Occupational History   Not on file  Tobacco Use   Smoking status: Never   Smokeless tobacco: Never   Tobacco comments:    Patient denied current use of tobacco  Vaping Use   Vaping status: Never Used  Substance and Sexual Activity   Alcohol use: Never   Drug use: Yes   Sexual activity: Not on file  Other Topics Concern   Not on file  Social History Narrative   Not on file   Social Drivers of Health   Financial Resource Strain: Not on file  Food Insecurity: Food Insecurity Present (01/17/2024)   Hunger Vital Sign    Worried About Running Out of Food in the Last Year: Often true    Ran Out of Food in the Last Year: Often true  Transportation Needs: No Transportation Needs (01/17/2024)   PRAPARE - Administrator, Civil Service (Medical): No    Lack of Transportation (Non-Medical): No  Physical Activity: Not on file  Stress: Not on file  Social Connections: Unknown (01/17/2024)   Social Connection and Isolation Panel [NHANES]    Frequency of Communication with Friends and Family: Patient declined    Frequency of Social Gatherings with Friends and Family: Patient declined    Attends Religious Services: Patient declined    Database administrator or Organizations: Patient declined  Attends Banker Meetings: Patient declined    Marital Status: Never married   Additional Social History:                         Sleep: Good  Appetite:  Good  Current Medications: Current Facility-Administered Medications  Medication Dose Route Frequency Provider Last Rate Last Admin   acetaminophen  (TYLENOL ) tablet 650 mg  650 mg Oral Q6H PRN Penn, Cicely, NP       alum & mag hydroxide-simeth (MAALOX/MYLANTA) 200-200-20 MG/5ML suspension 30 mL  30 mL Oral Q4H PRN Penn, Cicely, NP       benztropine  (COGENTIN ) tablet  0.5 mg  0.5 mg Oral Daily Penn, Cicely, NP   0.5 mg at 01/22/24 1610   haloperidol  (HALDOL ) tablet 5 mg  5 mg Oral TID PRN Monroe Antigua, NP       And   diphenhydrAMINE  (BENADRYL ) capsule 50 mg  50 mg Oral TID PRN Monroe Antigua, NP       haloperidol  lactate (HALDOL ) injection 10 mg  10 mg Intramuscular TID PRN Penn, Alaine Howells, NP       And   diphenhydrAMINE  (BENADRYL ) injection 50 mg  50 mg Intramuscular TID PRN Penn, Alaine Howells, NP       And   LORazepam  (ATIVAN ) injection 2 mg  2 mg Intramuscular TID PRN Penn, Alaine Howells, NP       haloperidol  lactate (HALDOL ) injection 5 mg  5 mg Intramuscular TID PRN Penn, Alaine Howells, NP       And   diphenhydrAMINE  (BENADRYL ) injection 50 mg  50 mg Intramuscular TID PRN Penn, Alaine Howells, NP       And   LORazepam  (ATIVAN ) injection 2 mg  2 mg Intramuscular TID PRN Penn, Alaine Howells, NP       magnesium  hydroxide (MILK OF MAGNESIA) suspension 30 mL  30 mL Oral Daily PRN Penn, Alaine Howells, NP       [START ON 01/27/2024] paliperidone  (INVEGA  SUSTENNA) injection 156 mg  156 mg Intramuscular Q28 days Byungura, Veronique M, NP       paliperidone  (INVEGA ) 24 hr tablet 3 mg  3 mg Oral Daily Penn, Cicely, NP   3 mg at 01/22/24 9604    Lab Results: No results found for this or any previous visit (from the past 48 hours).  Blood Alcohol level:  Lab Results  Component Value Date   [facility] <15 01/14/2024   ETH <10 01/13/2023    Metabolic Disorder Labs: Lab Results  Component Value Date   HGBA1C 5.0 01/18/2023   MPG 96.8 01/18/2023   MPG 99.67 12/03/2019   No results found for: "PROLACTIN" Lab Results  Component Value Date   CHOL 159 01/18/2023   TRIG 103 01/18/2023   HDL 57 01/18/2023   CHOLHDL 2.8 01/18/2023   VLDL 21 01/18/2023   LDLCALC 81 01/18/2023   LDLCALC 104 (H) 10/04/2020    Physical Findings: AIMS:  , ,  ,  ,    CIWA:    COWS:     Musculoskeletal: Strength & Muscle Tone: within normal limits Gait & Station: normal Patient leans: N/A  Psychiatric Specialty  Exam:  Presentation  General Appearance:  Casual  Eye Contact: Minimal  Speech: Other (comment) (selectively mute)  Speech Volume: Normal  Handedness: Right   Mood and Affect  Mood: Anxious (pacing)  Affect: Constricted   Thought Process  Thought Processes: Irrevelant  Descriptions of Associations:Circumstantial  Orientation:Partial  Thought Content:Abstract Reasoning  History of  Schizophrenia/Schizoaffective disorder:Yes  Duration of Psychotic Symptoms:No data recorded Hallucinations:Hallucinations: None  Ideas of Reference:None  Suicidal Thoughts:Suicidal Thoughts: No  Homicidal Thoughts:Homicidal Thoughts: No   Sensorium  Memory: Immediate Fair; Recent Poor; Remote Poor  Judgment: Poor  Insight: Poor   Executive Functions  Concentration: Poor  Attention Span: Fair  Recall: Poor  Fund of Knowledge: Fair  Language: Fair   Psychomotor Activity  Psychomotor Activity: Psychomotor Activity: Restlessness   Assets  Assets: Social Support; Physical Health; Housing   Sleep  Sleep: Sleep: Good    Physical Exam:  Review of Systems  Constitutional: Negative.   HENT: Negative.    Eyes: Negative.   Cardiovascular: Negative.   Gastrointestinal: Negative.   Genitourinary: Negative.   Musculoskeletal: Negative.   Skin: Negative.   Neurological: Negative.   Endo/Heme/Allergies: Negative.   Psychiatric/Behavioral:  The patient is nervous/anxious.    Blood pressure 100/65, pulse 61, temperature 98.2 F (36.8 C), resp. rate 16, height 5\' 11"  (1.803 m), weight 86.2 kg, SpO2 98%. Body mass index is 26.5 kg/m.   Treatment Plan Summary: Daily contact with patient to assess and evaluate symptoms and progress in treatment and Medication management  Lindajo Resides, MD 01/22/2024, 2:18 PM

## 2024-01-22 NOTE — Plan of Care (Signed)
  Problem: Education: Goal: Knowledge of [facility] Education information/materials will improve Outcome: Not Met (add Reason) Goal: Emotional status will improve Outcome: Not Met (add Reason) Goal: Mental status will improve Outcome: Not Met (add Reason) Goal: Verbalization of understanding the information provided will improve Outcome: Not Met (add Reason)   Problem: Activity: Goal: Interest or engagement in activities will improve Outcome: Not Met (add Reason) Goal: Sleeping patterns will improve Outcome: Not Met (add Reason)

## 2024-01-22 NOTE — Group Note (Signed)
 Date:  01/22/2024 Time:  8:45 PM  Group Topic/Focus:  Wrap-Up Group:   The focus of this group is to help patients review their daily goal of treatment and discuss progress on daily workbooks.    Participation Level:  Did Not Attend   Devota Fontan 01/22/2024, 8:45 PM

## 2024-01-23 DIAGNOSIS — F200 Paranoid schizophrenia: Secondary | ICD-10-CM | POA: Diagnosis not present

## 2024-01-23 MED ORDER — PALIPERIDONE ER 3 MG PO TB24
6.0000 mg | ORAL_TABLET | Freq: Every day | ORAL | Status: DC
  Administered 2024-01-24: 6 mg via ORAL
  Filled 2024-01-23: qty 2

## 2024-01-23 NOTE — Plan of Care (Signed)
  Problem: Safety: Goal: Periods of time without injury will increase Outcome: Progressing   Problem: Physical Regulation: Goal: Ability to maintain clinical measurements within normal limits will improve Outcome: Progressing   Problem: Coping: Goal: Ability to demonstrate self-control will improve Outcome: Progressing   Problem: Coping: Goal: Ability to verbalize frustrations and anger appropriately will improve Outcome: Progressing   Problem: Education: Goal: Verbalization of understanding the information provided will improve Outcome: Progressing   Problem: Education: Goal: Emotional status will improve Outcome: Progressing

## 2024-01-23 NOTE — Group Note (Unsigned)
 Date:  01/23/2024 Time:  11:40 AM  Group Topic/Focus:  Developing a Wellness Toolbox:   The focus of this group is to help patients develop a "wellness toolbox" with skills and strategies to promote recovery upon discharge.     Participation Level:  {BHH PARTICIPATION ZOXWR:60454}  Participation Quality:  {BHH PARTICIPATION QUALITY:22265}  Affect:  {BHH AFFECT:22266}  Cognitive:  {BHH COGNITIVE:22267}  Insight: {BHH Insight2:20797}  Engagement in Group:  {BHH ENGAGEMENT IN UJWJX:91478}  Modes of Intervention:  {BHH MODES OF INTERVENTION:22269}  Additional Comments:  ***  Eric Pineda L Selinda Korzeniewski 01/23/2024, 11:40 AM

## 2024-01-23 NOTE — Progress Notes (Signed)
 Pt randomly paces the hall.  Preoccupied in thoughts.  Denied AVH, SI/HI, depression and anxiety.  Stated, "I am fine."  Withdrawn, brief in response and minimal or no engagement.    01/22/24 2200  Psych Admission Type (Psych Patients Only)  Admission Status Involuntary  Psychosocial Assessment  Patient Complaints None  Eye Contact Brief  Facial Expression Flat  Affect Flat  Speech Soft  Interaction Minimal  Motor Activity Pacing  Appearance/Hygiene In scrubs  Behavior Characteristics Pacing;Guarded  Mood Preoccupied  Thought Process  Coherency Unable to assess  Content Preoccupation  Delusions None reported or observed  Perception UTA  Hallucination None reported or observed  Judgment Limited  Confusion None  Danger to Self  Current suicidal ideation? Denies  Agreement Not to Harm Self Yes  Description of Agreement Verbal  Danger to Others  Danger to Others None reported or observed

## 2024-01-23 NOTE — Group Note (Signed)
 Date:  01/23/2024 Time:  9:14 PM  Group Topic/Focus:  Wrap-Up Group:   The focus of this group is to help patients review their daily goal of treatment and discuss progress on daily workbooks.    Participation Level:  Did Not Attend   Devota Fontan 01/23/2024, 9:14 PM

## 2024-01-23 NOTE — Group Note (Signed)
 Date:  01/23/2024 Time:  11:52 AM  Group Topic/Focus:  Developing a Wellness Toolbox:   The focus of this group is to help patients develop a "wellness toolbox" with skills and strategies to promote recovery upon discharge.    Participation Level:  Did Not Attend  Eric Pineda 01/23/2024, 11:52 AM

## 2024-01-23 NOTE — Progress Notes (Signed)
 Psychiatric Progress Note  Patient: Eric Pineda MRN: [id number] Date of Note: Jan 23, 2024 Time of Note: 7:56 PM EDT  Reason for Encounter: Daily Progress Note / Follow-up Evaluation  History of Present Illness: [name] is a 28 year old male with a history of Schizophrenia and medication non-adherence, admitted under Involuntary Commitment (IVC) following family reports of worsening paranoia, ritualistic behaviors (pacing), irritability, social withdrawal, and post-prandial vomiting over the past few weeks/months. Urine Drug Screen on admission was negative.  Subjective: Patient remains Unable To Assess (UTA) for detailed subjective report due to guardedness, evasiveness, and likely ongoing thought disorder. When interacted with, he continues to minimize his symptoms and situation, demonstrating poor insight. He does not spontaneously report medication side effects.  Objective: Blood pressure 110/77, pulse 64, temperature (!) 97.3 F (36.3 C), resp. rate 18, height 5\' 11"  (1.803 m), weight 86.2 kg, SpO2 98%.    Labs: Admission UDS negative. Recent BAL negative. No acute labs available. Current Inpatient Medications: Paliperidone  (Invega ) 6mg  PO Daily (Dose increased from 3mg  today, 01/23/24) Benztropine  (Cogentin ) 0.5mg  PO Daily PRNs: Haloperidol  5mg  PO TID; Haloperidol  5-10mg  IM TID +/- Diphenhydramine  50mg  PO/IM +/- Lorazepam  2mg  IM (Agitation Cocktail); Acetaminophen ; Antacid; Milk of Magnesia. Planned: Paliperidone  LAI (Invega  Sustenna) 156mg  IM scheduled for tomorrow, 01/24/2024. Mental Status Exam & Behavior: Patient observed pacing in hallways, appearing restless and anxious. Remains internally preoccupied with minimal eye contact. Speech is minimal, often selectively mute or irrelevant when he does speak. Affect is constricted. Thought process appears disorganized (irrelevant, circumstantial). Thought content notable for poor insight and poor judgment. Persistently denies SI/HI.  No overt hallucinations observed. Orientation remains partial. Cognition: Grossly impaired concentration and recent memory. No aggressive behaviors have been noted on the unit. Assessment: [name] continues to exhibit significant symptoms of an acute exacerbation of Schizophrenia, including paranoia (inferred), disorganized thought process and behavior (pacing), anxiety, poor insight, and poor judgment. There has been minimal clinical improvement noted on Paliperidone  3mg  daily. To optimize potential response prior to initiating the Long-Acting Injectable (LAI), the oral Paliperidone  dose was increased to 6mg  daily today. The patient remains at risk due to the severity of psychosis and impaired judgment, although he currently denies SI/HI and is not aggressive. The plan to initiate Invega  Sustenna tomorrow remains appropriate given his history of medication non-adherence. His psychosocial situation includes family support (though strained) and housing, but also food insecurity.  Principal Diagnosis: Schizophrenia (HCC) Plan:  Medication Management: Administer Paliperidone  (Invega ) 6mg  PO daily. Continue Benztropine  0.5mg  PO daily. Administer first dose of Paliperidone  (Invega  [place]) 156mg  IM tomorrow, Jan 24, 2024, as planned. Continue availability of PRN medications for agitation/anxiety, monitoring use and effect. Monitor closely for EPS. Safety: Maintain close observation / appropriate safety monitoring protocols due to active psychosis, poor insight, and IVC status. Continue daily SI/HI assessment. Monitoring: Monitor mental status, response to dose increase, medication side effects, vital signs, eating patterns (noting history of post-prandial vomiting). Therapeutic Interventions: Continue daily assessments by the treatment team. Employ brief, frequent, reality-orienting interactions. Offer milieu activities, anticipating limited engagement. Psychosocial: Continue communication with sister  [name] V.) regarding treatment progress and plans. Ensure Social Work/Case Management is aware of food insecurity for discharge planning resources.  Current Facility-Administered Medications  Medication Dose Route Frequency Provider Last Rate Last Admin   acetaminophen  (TYLENOL ) tablet 650 mg  650 mg Oral Q6H PRN Penn, Cicely, NP       alum & mag hydroxide-simeth (MAALOX/MYLANTA) 200-200-20 MG/5ML suspension 30 mL  30 mL Oral Q4H PRN  Penn, Alaine Howells, NP       benztropine  (COGENTIN ) tablet 0.5 mg  0.5 mg Oral Daily Penn, Cicely, NP   0.5 mg at 01/23/24 0815   haloperidol  (HALDOL ) tablet 5 mg  5 mg Oral TID PRN Monroe Antigua, NP       And   diphenhydrAMINE  (BENADRYL ) capsule 50 mg  50 mg Oral TID PRN Monroe Antigua, NP       haloperidol  lactate (HALDOL ) injection 10 mg  10 mg Intramuscular TID PRN Monroe Antigua, NP       And   diphenhydrAMINE  (BENADRYL ) injection 50 mg  50 mg Intramuscular TID PRN Penn, Alaine Howells, NP       And   LORazepam  (ATIVAN ) injection 2 mg  2 mg Intramuscular TID PRN Monroe Antigua, NP       haloperidol  lactate (HALDOL ) injection 5 mg  5 mg Intramuscular TID PRN Monroe Antigua, NP       And   diphenhydrAMINE  (BENADRYL ) injection 50 mg  50 mg Intramuscular TID PRN Penn, Alaine Howells, NP       And   LORazepam  (ATIVAN ) injection 2 mg  2 mg Intramuscular TID PRN Penn, Alaine Howells, NP       magnesium  hydroxide (MILK OF MAGNESIA) suspension 30 mL  30 mL Oral Daily PRN Penn, Alaine Howells, NP       [START ON 01/27/2024] paliperidone  (INVEGA  SUSTENNA) injection 156 mg  156 mg Intramuscular Q28 days Byungura, Veronique M, NP       [START ON 01/24/2024] paliperidone  (INVEGA ) 24 hr tablet 6 mg  6 mg Oral Daily Terius Jacuinde R, MD

## 2024-01-23 NOTE — Progress Notes (Signed)
   01/23/24 1000  Psych Admission Type (Psych Patients Only)  Admission Status Involuntary  Psychosocial Assessment  Patient Complaints None  Eye Contact Brief  Facial Expression Flat  Affect Preoccupied  Speech Soft  Interaction Minimal  Motor Activity Pacing  Appearance/Hygiene Unremarkable  Behavior Characteristics Pacing;Guarded  Mood Preoccupied  Thought Process  Coherency Unable to assess  Content Preoccupation  Delusions None reported or observed  Perception UTA  Hallucination None reported or observed  Judgment Limited  Confusion None  Danger to Self  Current suicidal ideation? Denies  Agreement Not to Harm Self Yes  Description of Agreement verbal  Danger to Others  Danger to Others None reported or observed

## 2024-01-23 NOTE — Group Note (Signed)
 Date:  01/23/2024 Time:  6:34 PM  Group Topic/Focus:  Wellness Toolbox:   The focus of this group is to discuss various aspects of wellness, balancing those aspects and exploring ways to increase the ability to experience wellness.  Patients will create a wellness toolbox for use upon discharge.    Participation Level:  Did Not Attend   Mellie Sprinkle [facility] 01/23/2024, 6:34 PM

## 2024-01-23 NOTE — Plan of Care (Signed)
   Problem: Education: Goal: Knowledge of [facility] Education information/materials will improve Outcome: Progressing Goal: Emotional status will improve Outcome: Progressing Goal: Mental status will improve Outcome: Progressing Goal: Verbalization of understanding the information provided will improve Outcome: Progressing

## 2024-01-23 NOTE — Progress Notes (Signed)
 Pt calm and pleasant during assessment denying SI/HI/AVH. Pt observed by this Clinical research associate interacting appropriately with staff and peers on the unit. Pt didn't have any scheduled medications tonight and hasn't requested anything PRN as of now. Pt given education, support, and encouragement to be active in his treatment plan. Pt being monitored Q 15 minutes for safety per unit protocol, remains safe on the unit

## 2024-01-24 DIAGNOSIS — F200 Paranoid schizophrenia: Principal | ICD-10-CM

## 2024-01-24 LAB — FOLATE: Folate: 8.9 ng/mL (ref >5.9)

## 2024-01-24 LAB — TSH: TSH: 3.374 u[IU]/mL (ref 0.350–4.500)

## 2024-01-24 MED ORDER — PALIPERIDONE ER 3 MG PO TB24
9.0000 mg | ORAL_TABLET | Freq: Every day | ORAL | Status: DC
  Administered 2024-01-25 – 2024-01-27 (×3): 9 mg via ORAL
  Filled 2024-01-24 (×3): qty 3

## 2024-01-24 NOTE — Group Note (Signed)
 Date:  01/24/2024 Time:  4:20 PM  Group Topic/Focus:  Activity Group : The focus of the group is to promote activity for the patients and encourage them to go outside to the courtyard to get some fresh air and some exercise.    Participation Level:  Did Not Attend   Eric Pineda [name] 01/24/2024, 4:20 PM

## 2024-01-24 NOTE — Group Note (Signed)
 Date:  01/24/2024 Time:  4:15 PM  Group Topic/Focus:  Building Self Esteem:   The Focus of this group is helping patients become aware of the effects of self-esteem on their lives, the things they and others do that enhance or undermine their self-esteem, seeing the relationship between their level of self-esteem and the choices they make and learning ways to enhance self-esteem.    Participation Level:  Did Not Attend   Eric Pineda [name] 01/24/2024, 4:15 PM

## 2024-01-24 NOTE — Plan of Care (Signed)
   Problem: Health Behavior/Discharge Planning: Goal: Compliance with treatment plan for underlying cause of condition will improve Outcome: Progressing   Problem: Safety: Goal: Periods of time without injury will increase Outcome: Progressing

## 2024-01-24 NOTE — Progress Notes (Signed)
 Pt calm and pleasant during assessment denying SI/HI/AVH. Pt observed by this Clinical research associate interacting appropriately with staff and peers on the unit. Pt didn't have any scheduled medications tonight and hasn't requested anything PRN as of now. Pt given education, support, and encouragement to be active in his treatment plan. Pt being monitored Q 15 minutes for safety per unit protocol, remains safe on the unit

## 2024-01-24 NOTE — Plan of Care (Signed)
   Problem: Education: Goal: Emotional status will improve Outcome: Progressing Goal: Mental status will improve Outcome: Progressing

## 2024-01-24 NOTE — Group Note (Signed)
 Recreation Therapy Group Note   Group Topic:Coping Skills  Group Date: 01/24/2024 Start Time: 1100 End Time: 1150 Facilitators: Deatrice Factor, LRT, CTRS Location:  Craft Room  Group Description: Mind Map.  Patient was provided a blank template of a diagram with 32 blank boxes in a tiered system, branching from the center (similar to a bubble chart). LRT directed patients to label the middle of the diagram "Coping Skills". LRT and patients then came up with 8 different coping skills as examples. Pt were directed to record their coping skills in the 2nd tier boxes closest to the center.  Patients would then share their coping skills with the group as LRT wrote them out. LRT gave a handout of 99 different coping skills at the end of group.   Goal Area(s) Addressed: Patients will be able to define "coping skills". Patient will identify new coping skills.  Patient will increase communication.   Affect/Mood: N/A   Participation Level: Did not attend    Clinical Observations/Individualized Feedback: Patient did not attend group.   Plan: Continue to engage patient in RT group sessions 2-3x/week.   Deatrice Factor, LRT, CTRS 01/24/2024 1:33 PM

## 2024-01-24 NOTE — Progress Notes (Signed)
 EKG and labs done tonight

## 2024-01-24 NOTE — BH IP Treatment Plan (Signed)
 Interdisciplinary Treatment and Diagnostic Plan Update  01/24/2024 Time of Session: 9:00 AM Eric Pineda MRN: [id number]  Principal Diagnosis: Schizophrenia (HCC)  Secondary Diagnoses: Principal Problem:   Schizophrenia (HCC)   Current Medications:  Current Facility-Administered Medications  Medication Dose Route Frequency Provider Last Rate Last Admin   acetaminophen  (TYLENOL ) tablet 650 mg  650 mg Oral Q6H PRN Penn, Cicely, NP       alum & mag hydroxide-simeth (MAALOX/MYLANTA) 200-200-20 MG/5ML suspension 30 mL  30 mL Oral Q4H PRN Penn, Cicely, NP       benztropine  (COGENTIN ) tablet 0.5 mg  0.5 mg Oral Daily Penn, Cicely, NP   0.5 mg at 01/24/24 0857   haloperidol  (HALDOL ) tablet 5 mg  5 mg Oral TID PRN Monroe Antigua, NP       And   diphenhydrAMINE  (BENADRYL ) capsule 50 mg  50 mg Oral TID PRN Monroe Antigua, NP       haloperidol  lactate (HALDOL ) injection 10 mg  10 mg Intramuscular TID PRN Penn, Alaine Howells, NP       And   diphenhydrAMINE  (BENADRYL ) injection 50 mg  50 mg Intramuscular TID PRN Penn, Alaine Howells, NP       And   LORazepam  (ATIVAN ) injection 2 mg  2 mg Intramuscular TID PRN Penn, Alaine Howells, NP       haloperidol  lactate (HALDOL ) injection 5 mg  5 mg Intramuscular TID PRN Penn, Alaine Howells, NP       And   diphenhydrAMINE  (BENADRYL ) injection 50 mg  50 mg Intramuscular TID PRN Penn, Alaine Howells, NP       And   LORazepam  (ATIVAN ) injection 2 mg  2 mg Intramuscular TID PRN Penn, Alaine Howells, NP       magnesium  hydroxide (MILK OF MAGNESIA) suspension 30 mL  30 mL Oral Daily PRN Penn, Alaine Howells, NP       [START ON 01/27/2024] paliperidone  (INVEGA  SUSTENNA) injection 156 mg  156 mg Intramuscular Q28 days Byungura, Veronique M, NP       paliperidone  (INVEGA ) 24 hr tablet 6 mg  6 mg Oral Daily Madaram, Kondal R, MD   6 mg at 01/24/24 0857   PTA Medications: No medications prior to admission.    Patient Stressors:    Patient Strengths:    Treatment Modalities: Medication Management, Group therapy, Case  management,  1 to 1 session with clinician, Psychoeducation, Recreational therapy.   Physician Treatment Plan for Primary Diagnosis: Schizophrenia (HCC) Long Term Goal(s): Improvement in symptoms so as ready for discharge   Short Term Goals: Ability to identify changes in lifestyle to reduce recurrence of condition will improve Ability to verbalize feelings will improve Ability to disclose and discuss suicidal ideas Ability to demonstrate self-control will improve Ability to identify and develop effective coping behaviors will improve Ability to maintain clinical measurements within normal limits will improve Compliance with prescribed medications will improve  Medication Management: Evaluate patient's response, side effects, and tolerance of medication regimen.  Therapeutic Interventions: 1 to 1 sessions, Unit Group sessions and Medication administration.  Evaluation of Outcomes: Not Met  Physician Treatment Plan for Secondary Diagnosis: Principal Problem:   Schizophrenia (HCC)  Long Term Goal(s): Improvement in symptoms so as ready for discharge   Short Term Goals: Ability to identify changes in lifestyle to reduce recurrence of condition will improve Ability to verbalize feelings will improve Ability to disclose and discuss suicidal ideas Ability to demonstrate self-control will improve Ability to identify and develop effective coping behaviors will improve Ability to maintain clinical measurements  within normal limits will improve Compliance with prescribed medications will improve     Medication Management: Evaluate patient's response, side effects, and tolerance of medication regimen.  Therapeutic Interventions: 1 to 1 sessions, Unit Group sessions and Medication administration.  Evaluation of Outcomes: Not Met   RN Treatment Plan for Primary Diagnosis: Schizophrenia (HCC) Long Term Goal(s): Knowledge of disease and therapeutic regimen to maintain health will  improve  Short Term Goals:  Ability to remain free from injury will improve, Ability to verbalize frustration and anger appropriately will improve, Ability to demonstrate self-control, Ability to participate in decision making will improve, Ability to verbalize feelings will improve, Ability to disclose and discuss suicidal ideas, Ability to identify and develop effective coping behaviors will improve, and Compliance with prescribed medications will improve   Medication Management: RN will administer medications as ordered by provider, will assess and evaluate patient's response and provide education to patient for prescribed medication. RN will report any adverse and/or side effects to prescribing provider.  Therapeutic Interventions: 1 on 1 counseling sessions, Psychoeducation, Medication administration, Evaluate responses to treatment, Monitor vital signs and CBGs as ordered, Perform/monitor CIWA, COWS, AIMS and Fall Risk screenings as ordered, Perform wound care treatments as ordered.  Evaluation of Outcomes: Not Met   LCSW Treatment Plan for Primary Diagnosis: Schizophrenia (HCC) Long Term Goal(s): Safe transition to appropriate next level of care at discharge, Engage patient in therapeutic group addressing interpersonal concerns.  Short Term Goals:  Engage patient in aftercare planning with referrals and resources, Increase social support, Increase ability to appropriately verbalize feelings, Increase emotional regulation, Facilitate acceptance of mental health diagnosis and concerns, Identify triggers associated with mental health/substance abuse issues, and Increase skills for wellness and recovery   Therapeutic Interventions: Assess for all discharge needs, 1 to 1 time with Social worker, Explore available resources and support systems, Assess for adequacy in community support network, Educate family and significant other(s) on suicide prevention, Complete Psychosocial Assessment,  Interpersonal group therapy.  Evaluation of Outcomes: Not Met   Progress in Treatment: Attending groups: No. 01/24/24 Update: Yes. And No.  Participating in groups: No. 01/24/24 Update: Yes. And No.  Taking medication as prescribed: Yes. 01/24/24 Update: Yes. Toleration medication: Yes. 01/24/24 Update: Yes. Family/Significant other contact made: No, will contact:  if given permission. 01/24/24 Update: No. Patient declined. SPE completed with pt, as pt refused to consent to family contact. SPI pamphlet provided to pt and pt was encouraged to share information with support network, ask questions, and talk about any concerns relating to SPE. Pt denies access to guns/firearms and verbalized understanding of information provided. Mobile Crisis information also provided to pt.   Patient understands diagnosis: Yes.  01/24/24 Update: No.  Discussing patient identified problems/goals with staff: No. 01/24/24 Update: No.  Medical problems stabilized or resolved: Yes. 01/24/24 Update: Yes.  Denies suicidal/homicidal ideation: No. 01/24/24 Update: Yes.  Issues/concerns per patient self-inventory: No. 01/24/24 Update: No.  Other: none. 01/24/24 Update: None.   New problem(s) identified: No, Describe:  None 01/24/24 Update: None.   New Short Term/Long Term Goal(s): detox, elimination of symptoms of psychosis, medication management for mood stabilization; elimination of SI thoughts; development of comprehensive mental wellness/sobriety plan.  01/24/24 Update: Goal to remain the same.   Patient Goals:  Pt declined to attend treatment team meeting despite personal invitation by staff. 01/24/24 Update: No changes.    Discharge Plan or Barriers: CSW will assist pt with development of an appropriate aftercare/discharge plan. 01/24/24 Update: Goal to  remain the same.   Reason for Continuation of Hospitalization: Aggression Anxiety Medication stabilization   Estimated Length of Stay: 1-7 days 01/24/24  Update: TBD  Last 3 [place] Suicide Severity Risk Score: Flowsheet Row Admission (Current) from 01[facility]17/20[facility] in [facility] ED from 01/14/2024 in [facility] Emergency Department at [facility] Admission (Discharged) from 01/14/2023 in [facility]PAT[facility] BEH[facility]AL MEDICINE  C-SSRS RISK CATEGORY No Risk No Risk No Risk       Last PHQ 2/9 Scores:     No data to display          Scribe for Treatment Team: Declyn Delsol M Mishel Sans, LCSW 01/24/2024 2:08 PM

## 2024-01-24 NOTE — [facility] Counselor (Signed)
 CSW touched based with patient about ACTT team services. Patient agreed and signed needed consents.    [facility] referral submitted via fax to [phone] and email at [email or web address] at the organization's request.   [facility] location office number is:  [phone] office.    CSW team to continue to assess.    Eion Timbrook, MSW, LCSWA 01/24/2024 2:54 PM

## 2024-01-24 NOTE — Group Note (Signed)
 LCSW Group Therapy Note   Group Date: 01/24/2024 Start Time: 1300 End Time: 1400   Type of Therapy and Topic:  Group Therapy: Challenging Core Beliefs  Participation Level:  Minimal  Description of Group:  Patients were educated about core beliefs and asked to identify one harmful core belief that they have. Patients were asked to explore from where those beliefs originate. Patients were asked to discuss how those beliefs make them feel and the resulting behaviors of those beliefs. They were then be asked if those beliefs are true and, if so, what evidence they have to support them. Lastly, group members were challenged to replace those negative core beliefs with helpful beliefs.   Therapeutic Goals:   1. Patient will identify harmful core beliefs and explore the origins of such beliefs. 2. Patient will identify feelings and behaviors that result from those core beliefs. 3. Patient will discuss whether such beliefs are true. 4.  Patient will replace harmful core beliefs with helpful ones.  Summary of Patient Progress:  Patient minimally engaged in processing and exploring how core beliefs are formed and how they impact thoughts, feelings, and behaviors. Patient proved open to input from peers and feedback from CSW. Patient demonstrated basic insight into the subject matter, was respectful and supportive of peers, and participated throughout the entire session.  Therapeutic Modalities: Cognitive Behavioral Therapy; Solution-Focused Therapy   Tywanna Seifer M Rex Oesterle, LCSWA 01/24/2024  3:33 PM

## 2024-01-24 NOTE — Progress Notes (Signed)
 Patient presents with flat affect and very minimal interaction. Patient denies SI,HI, and A/V/H with no plan or intent and states he slept well last night. Patient denies discomfort or pain and is observed pacing around the unit. Patient remains med compliant and cooperative in unit.

## 2024-01-24 NOTE — Group Note (Signed)
 Date:  01/24/2024 Time:  10:40 PM  Group Topic/Focus:  Rediscovering Joy:   The focus of this group is to explore various ways to relieve stress in a positive manner.    Participation Level:  Did Not Attend  Participation Quality:   none  Affect:   none  Cognitive:   none  Insight: None  Engagement in Group:   none  Modes of Intervention:   none  Additional Comments:  none    Fin Hupp 01/24/2024, 10:40 PM

## 2024-01-24 NOTE — Group Note (Signed)
 Recreation Therapy Group Note   Group Topic:General Recreation  Group Date: 01/24/2024 Start Time: 1530 End Time: 1620 Facilitators: Deatrice Factor, LRT, CTRS Location: Courtyard  Group Description: [employer]. LRT and patients played games of basketball, drew with chalk, and played corn hole while outside in the courtyard while getting fresh air and sunlight. Music was being played in the background. LRT and peers conversed about different games they have played before, what they do in their free time and anything else that is on their minds. LRT encouraged pts to drink water after being outside, sweating and getting their heart rate up.  Goal Area(s) Addressed: Patient will build on frustration tolerance skills. Patients will partake in a competitive play game with peers. Patients will gain knowledge of new leisure interest/hobby.    Affect/Mood: N/A   Participation Level: Did not attend    Clinical Observations/Individualized Feedback: Patient did not attend group.   Plan: Continue to engage patient in RT group sessions 2-3x/week.   Deatrice Factor, LRT, CTRS 01/24/2024 5:17 PM

## 2024-01-24 NOTE — Progress Notes (Signed)
 [facility] MD Progress Note  01/24/2024 2:51 PM Eric Pineda  MRN:  [id number]   Eric Pineda is a 28 year old male who presented to the ER, under IVC. Per the patient sister Eric Pineda [phone]) they have noticed a change in his behaviors within the last two weeks. He is becoming paranoid and distant. He is walking around the house ten times and then the barn for ten times. When they call him or asked what he is doing, he will respond with an attitude or verbally aggressive. They also state, for the last two months, after he eats, he goes outside and vomits and then comes in the home and eat again. They family believes his current behaviors are due to substance use. Patient's UDS is negative for any substances. Patient has a history of schizophrenia and he refuses to take medications, as well as follow up with a provider.    Subjective: Patient's case discussed with multidisciplinary team, all notes and vitals were reviewed.  No reported behavioral issues overnight.  Patient is seen for reassessment, presents with minimal engagement, minimal eye contact, no insight into current mental illness.  States he is unsure why he was admitted to the inpatient psychiatric facility but that he was brought by his family for unknown reasons.  He denies any psychiatric symptoms.  Denies SI/HI and AVH.  Does appear to be internally preoccupied.  Was observed pacing the milieu, laughing inappropriately to himself.  He is med compliant.    Principal Problem: Schizophrenia (HCC) Diagnosis: Principal Problem:   Schizophrenia (HCC)  Total Time spent with patient: 20 minutes  Past Psychiatric History: see H&P  Past Medical History:  Past Medical History:  Diagnosis Date   Bizarre behavior 12/02/2019   History reviewed. No pertinent surgical history. Family History: History reviewed. No pertinent family history. Family Psychiatric  History: see H&P Social History:  Social History   Substance and Sexual  Activity  Alcohol Use Never     Social History   Substance and Sexual Activity  Drug Use Yes    Social History   Socioeconomic History   Marital status: Single    Spouse name: Not on file   Number of children: Not on file   Years of education: Not on file   Highest education level: Not on file  Occupational History   Not on file  Tobacco Use   Smoking status: Never   Smokeless tobacco: Never   Tobacco comments:    Patient denied current use of tobacco  Vaping Use   Vaping status: Never Used  Substance and Sexual Activity   Alcohol use: Never   Drug use: Yes   Sexual activity: Not on file  Other Topics Concern   Not on file  Social History Narrative   Not on file   Social Drivers of Health   Financial Resource Strain: Not on file  Food Insecurity: Food Insecurity Present (01/17/2024)   Hunger Vital Sign    Worried About Running Out of Food in the Last Year: Often true    Ran Out of Food in the Last Year: Often true  Transportation Needs: No Transportation Needs (01/17/2024)   PRAPARE - Administrator, Civil Service (Medical): No    Lack of Transportation (Non-Medical): No  Physical Activity: Not on file  Stress: Not on file  Social Connections: Unknown (01/17/2024)   Social Connection and Isolation Panel [NHANES]    Frequency of Communication with Friends and Family: Patient declined    Frequency of  Social Gatherings with Friends and Family: Patient declined    Attends Religious Services: Patient declined    Database administrator or Organizations: Patient declined    Attends Engineer, structural: Patient declined    Marital Status: Never married   Additional Social History:                         Sleep: Fair  Appetite:  Fair  Current Medications: Current Facility-Administered Medications  Medication Dose Route Frequency Provider Last Rate Last Admin   acetaminophen  (TYLENOL ) tablet 650 mg  650 mg Oral Q6H PRN Penn, Cicely,  NP       alum & mag hydroxide-simeth (MAALOX/MYLANTA) 200-200-20 MG/5ML suspension 30 mL  30 mL Oral Q4H PRN Penn, Alaine Howells, NP       benztropine  (COGENTIN ) tablet 0.5 mg  0.5 mg Oral Daily Penn, Cicely, NP   0.5 mg at 01/24/24 0857   haloperidol  (HALDOL ) tablet 5 mg  5 mg Oral TID PRN Monroe Antigua, NP       And   diphenhydrAMINE  (BENADRYL ) capsule 50 mg  50 mg Oral TID PRN Monroe Antigua, NP       haloperidol  lactate (HALDOL ) injection 10 mg  10 mg Intramuscular TID PRN Penn, Alaine Howells, NP       And   diphenhydrAMINE  (BENADRYL ) injection 50 mg  50 mg Intramuscular TID PRN Penn, Alaine Howells, NP       And   LORazepam  (ATIVAN ) injection 2 mg  2 mg Intramuscular TID PRN Penn, Alaine Howells, NP       haloperidol  lactate (HALDOL ) injection 5 mg  5 mg Intramuscular TID PRN Penn, Alaine Howells, NP       And   diphenhydrAMINE  (BENADRYL ) injection 50 mg  50 mg Intramuscular TID PRN Penn, Alaine Howells, NP       And   LORazepam  (ATIVAN ) injection 2 mg  2 mg Intramuscular TID PRN Penn, Alaine Howells, NP       magnesium  hydroxide (MILK OF MAGNESIA) suspension 30 mL  30 mL Oral Daily PRN Penn, Alaine Howells, NP       [START ON 01/27/2024] paliperidone  (INVEGA  SUSTENNA) injection 156 mg  156 mg Intramuscular Q28 days Byungura, Veronique M, NP       paliperidone  (INVEGA ) 24 hr tablet 6 mg  6 mg Oral Daily Madaram, Kondal R, MD   6 mg at 01/24/24 0857    Lab Results: No results found for this or any previous visit (from the past 48 hours).  Blood Alcohol level:  Lab Results  Component Value Date   [facility] <15 01/14/2024   ETH <10 01/13/2023    Metabolic Disorder Labs: Lab Results  Component Value Date   HGBA1C 5.0 01/18/2023   MPG 96.8 01/18/2023   MPG 99.67 12/03/2019   No results found for: "PROLACTIN" Lab Results  Component Value Date   CHOL 159 01/18/2023   TRIG 103 01/18/2023   HDL 57 01/18/2023   CHOLHDL 2.8 01/18/2023   VLDL 21 01/18/2023   LDLCALC 81 01/18/2023   LDLCALC 104 (H) 10/04/2020    Physical Findings: AIMS:  , ,  ,   ,    CIWA:    COWS:     Musculoskeletal: Strength & Muscle Tone: within normal limits Gait & Station: normal Patient leans: N/A  Psychiatric Specialty Exam:  Presentation  General Appearance:  Casual  Eye Contact: Minimal  Speech: Other (comment) (selectively mute)  Speech Volume: Normal  Handedness: Right  Mood and Affect  Mood: Anxious (pacing)  Affect: Constricted   Thought Process  Thought Processes: Irrevelant  Descriptions of Associations:Circumstantial  Orientation:Partial  Thought Content:Abstract Reasoning  History of Schizophrenia/Schizoaffective disorder:Yes  Duration of Psychotic Symptoms:No data recorded Hallucinations:No data recorded Ideas of Reference:None  Suicidal Thoughts:No data recorded Homicidal Thoughts:No data recorded  Sensorium  Memory: Immediate Fair; Recent Poor; Remote Poor  Judgment: Poor  Insight: Poor   Executive Functions  Concentration: Poor  Attention Span: Fair  Recall: Poor  Fund of Knowledge: Fair  Language: Fair   Psychomotor Activity  Psychomotor Activity:No data recorded  Assets  Assets: Social Support; Physical Health; Housing   Sleep  Sleep:No data recorded   Physical Exam: Physical Exam Vitals and nursing note reviewed.  Eyes:     Extraocular Movements: Extraocular movements intact.  Pulmonary:     Effort: Pulmonary effort is normal.  Neurological:     Mental Status: He is alert.  Psychiatric:        Attention and Perception: Attention normal.        Mood and Affect: Mood normal. Affect is blunt.        Behavior: Behavior is cooperative.        Cognition and Memory: Cognition is impaired.        Judgment: Judgment is inappropriate.     Comments: Internally preoccupied, observed laughing inappropriately  Poverty of speech  Lacking insight, judgement impaired/limited    Review of Systems  Psychiatric/Behavioral:  Positive for hallucinations. The patient is  nervous/anxious.   All other systems reviewed and are negative.  Blood pressure 103/65, pulse (!) 51, temperature (!) 97.5 F (36.4 C), resp. rate 20, height 5\' 11"  (1.803 m), weight 86.2 kg, SpO2 99%. Body mass index is 26.5 kg/m.   Treatment Plan Summary: 1.    Safety and Monitoring:   --  Involuntary admission to inpatient psychiatric unit for safety, stabilization and treatment -- Daily contact with patient to assess and evaluate symptoms and progress in treatment -- Patient's case to be discussed in multi-disciplinary team meeting -- Observation Level : q15 minute checks -- Vital signs:  q12 hours -- Precautions: assault   2. Psychiatric Diagnoses and Treatment: Patient continues with symptoms of psychosis, observed to be laughing inappropriately while by himself today, insight and judgment limited, however he is med compliant.  Minimal interaction with others, appears to be internally preoccupied.  Recommendations for ACT team services prior to discharge.  We will continue to monitor for further treatment and stabilization.   -- These Invega  6 mg to 9 mg daily for schizophrenia -- Second loading dose of Invega  Sustenna 156 mg IM will be administered on 5/8 -- Continue Cogentin  0.5 mg daily for EPS   --  The risks/benefits/side-effects/alternatives to this medication were discussed in detail with the patient and time was given for questions. The patient consents to medication trial. -- Metabolic profile and EKG monitoring obtained while on an atypical antipsychotic  -- Encouraged patient to participate in unit milieu and in scheduled group therapies -- Short Term Goals: Ability to identify changes in lifestyle to reduce recurrence of condition will improve, Ability to verbalize feelings will improve, Ability to disclose and discuss suicidal ideas, Ability to demonstrate self-control will improve, Ability to identify and develop effective coping behaviors will improve, Ability to  maintain clinical measurements within normal limits will improve, Compliance with prescribed medications will improve, and Ability to identify triggers associated with substance abuse/mental health issues will improve -- Long Term  Goals: Improvement in symptoms so as ready for discharge        3. Medical Issues Being Addressed:               None    4. Discharge Planning:   -- Social work and case management to assist with discharge planning and identification of hospital follow-up needs prior to discharge -- Estimated LOS: 5-7 days -- Discharge Concerns: Need to establish a safety plan; Medication compliance and effectiveness -- Discharge Goals: Return home with outpatient referrals for mental health follow-up including medication management/psychotherapy   Sheilda Deputy, PA-C 01/24/2024, 2:51 PM

## 2024-01-25 DIAGNOSIS — F200 Paranoid schizophrenia: Secondary | ICD-10-CM | POA: Diagnosis not present

## 2024-01-25 LAB — VITAMIN B12: Vitamin B-12: 230 pg/mL (ref 180–914)

## 2024-01-25 LAB — RPR: RPR Ser Ql: NONREACTIVE

## 2024-01-25 LAB — HIV ANTIBODY (ROUTINE TESTING W REFLEX): HIV Screen 4th Generation wRfx: NONREACTIVE

## 2024-01-25 NOTE — Plan of Care (Signed)
  Problem: Safety: Goal: Periods of time without injury will increase Outcome: Progressing   Problem: Physical Regulation: Goal: Ability to maintain clinical measurements within normal limits will improve Outcome: Progressing   Problem: Coping: Goal: Ability to verbalize frustrations and anger appropriately will improve Outcome: Progressing   Problem: Education: Goal: Emotional status will improve Outcome: Progressing   Problem: Education: Goal: Knowledge of [facility] Education information/materials will improve Outcome: Progressing

## 2024-01-25 NOTE — Progress Notes (Signed)
 [facility] MD Progress Note  01/25/2024 2:40 PM Eric Pineda  MRN:  [id number]   Eric Pineda is a 28 year old male who presented to the ER, under IVC. Per the patient sister Eric Pineda [phone]) they have noticed a change in his behaviors within the last two weeks. He is becoming paranoid and distant. He is walking around the house ten times and then the barn for ten times. When they call him or asked what he is doing, he will respond with an attitude or verbally aggressive. They also state, for the last two months, after he eats, he goes outside and vomits and then comes in the home and eat again. They family believes his current behaviors are due to substance use. Patient's UDS is negative for any substances. Patient has a history of schizophrenia and he refuses to take medications, as well as follow up with a provider.    Subjective: Patient's case discussed with multidisciplinary team, all notes and vitals were reviewed.  No reported behavioral issues overnight.  Patient is seen for reassessment, presents with minimal engagement, minimal eye contact, no insight into current mental illness.  States he is unsure why he was admitted to the inpatient psychiatric facility but that he was brought by his family for unknown reasons.  He denies any psychiatric symptoms.  Denies SI/HI and AVH.  Does appear to be internally preoccupied.  Was observed pacing the milieu, laughing inappropriately to himself.  He is med compliant.    Principal Problem: Schizophrenia (HCC) Diagnosis: Principal Problem:   Schizophrenia (HCC)  Total Time spent with patient: 20 minutes  Past Psychiatric History: see H&P  Past Medical History:  Past Medical History:  Diagnosis Date   Bizarre behavior 12/02/2019   History reviewed. No pertinent surgical history. Family History: History reviewed. No pertinent family history. Family Psychiatric  History: see H&P Social History:  Social History   Substance and Sexual  Activity  Alcohol Use Never     Social History   Substance and Sexual Activity  Drug Use Yes    Social History   Socioeconomic History   Marital status: Single    Spouse name: Not on file   Number of children: Not on file   Years of education: Not on file   Highest education level: Not on file  Occupational History   Not on file  Tobacco Use   Smoking status: Never   Smokeless tobacco: Never   Tobacco comments:    Patient denied current use of tobacco  Vaping Use   Vaping status: Never Used  Substance and Sexual Activity   Alcohol use: Never   Drug use: Yes   Sexual activity: Not on file  Other Topics Concern   Not on file  Social History Narrative   Not on file   Social Drivers of Health   Financial Resource Strain: Not on file  Food Insecurity: Food Insecurity Present (01/17/2024)   Hunger Vital Sign    Worried About Running Out of Food in the Last Year: Often true    Ran Out of Food in the Last Year: Often true  Transportation Needs: No Transportation Needs (01/17/2024)   PRAPARE - Administrator, Civil Service (Medical): No    Lack of Transportation (Non-Medical): No  Physical Activity: Not on file  Stress: Not on file  Social Connections: Unknown (01/17/2024)   Social Connection and Isolation Panel [NHANES]    Frequency of Communication with Friends and Family: Patient declined    Frequency of  Social Gatherings with Friends and Family: Patient declined    Attends Religious Services: Patient declined    Database administrator or Organizations: Patient declined    Attends Engineer, structural: Patient declined    Marital Status: Never married   Additional Social History:                         Sleep: Fair  Appetite:  Fair  Current Medications: Current Facility-Administered Medications  Medication Dose Route Frequency Provider Last Rate Last Admin   acetaminophen  (TYLENOL ) tablet 650 mg  650 mg Oral Q6H PRN Penn, Cicely,  NP       alum & mag hydroxide-simeth (MAALOX/MYLANTA) 200-200-20 MG/5ML suspension 30 mL  30 mL Oral Q4H PRN Penn, Cicely, NP       benztropine  (COGENTIN ) tablet 0.5 mg  0.5 mg Oral Daily Penn, Cicely, NP   0.5 mg at 01/25/24 0900   haloperidol  (HALDOL ) tablet 5 mg  5 mg Oral TID PRN Monroe Antigua, NP       And   diphenhydrAMINE  (BENADRYL ) capsule 50 mg  50 mg Oral TID PRN Monroe Antigua, NP       haloperidol  lactate (HALDOL ) injection 10 mg  10 mg Intramuscular TID PRN Penn, Alaine Howells, NP       And   diphenhydrAMINE  (BENADRYL ) injection 50 mg  50 mg Intramuscular TID PRN Penn, Alaine Howells, NP       And   LORazepam  (ATIVAN ) injection 2 mg  2 mg Intramuscular TID PRN Penn, Alaine Howells, NP       haloperidol  lactate (HALDOL ) injection 5 mg  5 mg Intramuscular TID PRN Penn, Alaine Howells, NP       And   diphenhydrAMINE  (BENADRYL ) injection 50 mg  50 mg Intramuscular TID PRN Penn, Alaine Howells, NP       And   LORazepam  (ATIVAN ) injection 2 mg  2 mg Intramuscular TID PRN Penn, Alaine Howells, NP       magnesium  hydroxide (MILK OF MAGNESIA) suspension 30 mL  30 mL Oral Daily PRN Penn, Alaine Howells, NP       [START ON 01/27/2024] paliperidone  (INVEGA  SUSTENNA) injection 156 mg  156 mg Intramuscular Q28 days Byungura, Veronique M, NP       paliperidone  (INVEGA ) 24 hr tablet 9 mg  9 mg Oral Daily Marsden Zaino, PA-C   9 mg at 01/25/24 0900    Lab Results:  Results for orders placed or performed during the hospital encounter of 01/17/24 (from the past 48 hours)  RPR     Status: None   Collection Time: 01/24/24  9:46 PM  Result Value Ref Range   RPR Ser Ql NON REACTIVE NON REACTIVE    Comment: Performed at [facility], [address] [place], [place] [address]  HIV Antibody (routine testing w rflx)     Status: None   Collection Time: 01/24/24  9:46 PM  Result Value Ref Range   HIV Screen 4th Generation wRfx Non Reactive Non Reactive    Comment: Performed at [facility], [address] [place], [place] [address]  TSH      Status: None   Collection Time: 01/24/24  9:46 PM  Result Value Ref Range   TSH 3.374 0.350 - 4.500 uIU/mL    Comment: Performed by a 3rd Generation assay with a functional sensitivity of <=0.01 uIU/mL. Performed at [facility], [address] [place], [place] [address]   Vitamin B12  Status: None   Collection Time: 01/24/24  9:46 PM  Result Value Ref Range   Vitamin B-12 230 180 - 914 pg/mL    Comment: (NOTE) This assay is not validated for testing neonatal or myeloproliferative syndrome specimens for Vitamin B12 levels. Performed at [facility], [address], [place], [place] [address]   Folate     Status: None   Collection Time: 01/24/24  9:46 PM  Result Value Ref Range   Folate 8.9 >5.9 ng/mL    Comment: Performed at [facility], [address] [place], [place] [address]    Blood Alcohol level:  Lab Results  Component Value Date   [facility] <15 01/14/2024   ETH <10 01/13/2023    Metabolic Disorder Labs: Lab Results  Component Value Date   HGBA1C 5.0 01/18/2023   MPG 96.8 01/18/2023   MPG 99.67 12/03/2019   No results found for: "PROLACTIN" Lab Results  Component Value Date   CHOL 159 01/18/2023   TRIG 103 01/18/2023   HDL 57 01/18/2023   CHOLHDL 2.8 01/18/2023   VLDL 21 01/18/2023   LDLCALC 81 01/18/2023   LDLCALC 104 (H) 10/04/2020    Physical Findings: AIMS:  , ,  ,  ,    CIWA:    COWS:     Musculoskeletal: Strength & Muscle Tone: within normal limits Gait & Station: normal Patient leans: N/A  Psychiatric Specialty Exam:  Presentation  General Appearance:  Casual  Eye Contact: Minimal  Speech: Other (comment) (selectively mute)  Speech Volume: Normal  Handedness: Right   Mood and Affect  Mood: Anxious (pacing)  Affect: Constricted   Thought Process  Thought Processes: Irrevelant  Descriptions of Associations:Circumstantial  Orientation:Partial  Thought Content:Abstract  Reasoning  History of Schizophrenia/Schizoaffective disorder:Yes  Duration of Psychotic Symptoms:No data recorded Hallucinations:No data recorded Ideas of Reference:None  Suicidal Thoughts:No data recorded Homicidal Thoughts:No data recorded  Sensorium  Memory: Immediate Fair; Recent Poor; Remote Poor  Judgment: Poor  Insight: Poor   Executive Functions  Concentration: Poor  Attention Span: Fair  Recall: Poor  Fund of Knowledge: Fair  Language: Fair   Psychomotor Activity  Psychomotor Activity:No data recorded  Assets  Assets: Social Support; Physical Health; Housing   Sleep  Sleep:No data recorded   Physical Exam: Physical Exam Vitals and nursing note reviewed.  Eyes:     Extraocular Movements: Extraocular movements intact.  Pulmonary:     Effort: Pulmonary effort is normal.  Neurological:     Mental Status: He is alert.  Psychiatric:        Attention and Perception: Attention normal.        Mood and Affect: Mood normal. Affect is blunt.        Behavior: Behavior is cooperative.        Cognition and Memory: Cognition is impaired.        Judgment: Judgment is inappropriate.     Comments: Internally preoccupied, observed laughing inappropriately  Poverty of speech  Lacking insight, judgement impaired/limited    Review of Systems  Psychiatric/Behavioral:  Positive for hallucinations. The patient is nervous/anxious.   All other systems reviewed and are negative.  Blood pressure (!) 86/53, pulse (!) 57, temperature 97.6 F (36.4 C), resp. rate 14, height 5\' 11"  (1.803 m), weight 86.2 kg, SpO2 99%. Body mass index is 26.5 kg/m.   Treatment Plan Summary: 1.    Safety and Monitoring:   --  Involuntary admission to inpatient psychiatric unit for safety, stabilization and  treatment -- Daily contact with patient to assess and evaluate symptoms and progress in treatment -- Patient's case to be discussed in multi-disciplinary team meeting --  Observation Level : q15 minute checks -- Vital signs:  q12 hours -- Precautions: assault   2. Psychiatric Diagnoses and Treatment: Patient continues with symptoms of psychosis, observed to be laughing inappropriately while by himself today, insight and judgment limited, however he is med compliant.  Minimal interaction with others, appears to be internally preoccupied.  Recommendations for ACT team services prior to discharge.  We will continue to monitor for further treatment and stabilization.   -- These Invega  6 mg to 9 mg daily for schizophrenia -- Second loading dose of Invega  Sustenna 156 mg IM will be administered on 5/8 -- Continue Cogentin  0.5 mg daily for EPS   --  The risks/benefits/side-effects/alternatives to this medication were discussed in detail with the patient and time was given for questions. The patient consents to medication trial. -- Metabolic profile and EKG monitoring obtained while on an atypical antipsychotic  -- Encouraged patient to participate in unit milieu and in scheduled group therapies -- Short Term Goals: Ability to identify changes in lifestyle to reduce recurrence of condition will improve, Ability to verbalize feelings will improve, Ability to disclose and discuss suicidal ideas, Ability to demonstrate self-control will improve, Ability to identify and develop effective coping behaviors will improve, Ability to maintain clinical measurements within normal limits will improve, Compliance with prescribed medications will improve, and Ability to identify triggers associated with substance abuse/mental health issues will improve -- Long Term Goals: Improvement in symptoms so as ready for discharge        3. Medical Issues Being Addressed:               None    4. Discharge Planning:   -- Social work and case management to assist with discharge planning and identification of hospital follow-up needs prior to discharge -- Estimated LOS: 5-7 days -- Discharge  Concerns: Need to establish a safety plan; Medication compliance and effectiveness -- Discharge Goals: Return home with outpatient referrals for mental health follow-up including medication management/psychotherapy   Sheilda Deputy, PA-C 01/25/2024, 2:40 PM

## 2024-01-25 NOTE — Progress Notes (Signed)
 Pt calm and pleasant during assessment denying SI/HI/AVH. Pt observed by this Clinical research associate interacting appropriately with staff and peers on the unit. Pt didn't have any scheduled medications tonight and hasn't requested anything PRN as of now. Pt given education, support, and encouragement to be active in his treatment plan. Pt being monitored Q 15 minutes for safety per unit protocol, remains safe on the unit

## 2024-01-25 NOTE — Group Note (Signed)
 Recreation Therapy Group Note   Group Topic:Health and Wellness  Group Date: 01/25/2024 Start Time: 1000 End Time: 1050 Facilitators: Deatrice Factor, LRT, CTRS Location:  Craft Room  Activity Description/Intervention: Therapeutic Drumming. Patients with peers and staff were given the opportunity to engage in a leader facilitated [facility]HYTH[facility] Group Empowerment Drumming Circle with staff from the [employer], in partnership with [employer]. Teaching laboratory technician and trained [employer], Kathlyne Parchment leading with LRT observing and documenting intervention and pt response. This evidenced-based practice targets 7 areas of health and wellbeing in the human experience including: stress-reduction, exercise, self-expression, camaraderie/support, nurturing, spirituality, and music-making (leisure).    Goal Area(s) Addresses:  Patient will engage in pro-social way in music group.  Patient will follow directions of drum leader on the first prompt. Patient will demonstrate no behavioral issues during group.  Patient will identify if a reduction in stress level occurs as a result of participation in therapeutic drum circle.     Education: Leisure exposure, Pharmacologist, Musical expression, Discharge Planning   Affect/Mood: N/A   Participation Level: Did not attend    Clinical Observations/Individualized Feedback: Patient did not attend group.   Plan: Continue to engage patient in RT group sessions 2-3x/week.   Deatrice Factor, LRT, CTRS 01/25/2024 11:44 AM

## 2024-01-25 NOTE — Group Note (Signed)
 Recreation Therapy Group Note   Group Topic:Other  Group Date: 01/25/2024 Start Time: 1500 End Time: 1600 Facilitators: Deatrice Factor, LRT, CTRS Location: Courtyard  Group Description: [employer]. LRT and patients played games of basketball, drew with chalk, and played corn hole while outside in the courtyard while getting fresh air and sunlight. Music was being played in the background. LRT and peers conversed about different games they have played before, what they do in their free time and anything else that is on their minds. LRT encouraged pts to drink water after being outside, sweating and getting their heart rate up.  Goal Area(s) Addressed: Patient will build on frustration tolerance skills. Patients will partake in a competitive play game with peers. Patients will gain knowledge of new leisure interest/hobby.    Affect/Mood: N/A   Participation Level: Did not attend    Clinical Observations/Individualized Feedback: Patient did not attend group.   Plan: Continue to engage patient in RT group sessions 2-3x/week.   Deatrice Factor, LRT, CTRS 01/25/2024 5:16 PM

## 2024-01-25 NOTE — Plan of Care (Signed)
   Problem: Education: Goal: Emotional status will improve Outcome: Progressing Goal: Mental status will improve Outcome: Progressing

## 2024-01-25 NOTE — Progress Notes (Signed)
   01/24/24 1939  Psych Admission Type (Psych Patients Only)  Admission Status Involuntary  Psychosocial Assessment  Patient Complaints None  Eye Contact Brief  Facial Expression Flat  Affect Flat  Speech Soft  Interaction Minimal  Motor Activity Pacing  Appearance/Hygiene Unremarkable  Behavior Characteristics Cooperative  Mood Preoccupied  Thought Process  Coherency WDL  Content Preoccupation  Delusions None reported or observed  Perception WDL  Hallucination None reported or observed  Judgment Poor  Confusion None  Danger to Self  Current suicidal ideation? Denies  Agreement Not to Harm Self Yes  Description of Agreement verbal  Danger to Others  Danger to Others None reported or observed   Patient noted pacing in the hallway laughing inappropriately. Compliant with medications. Poor insight.No issues verbalized. Support and encouragement given.

## 2024-01-25 NOTE — Group Note (Signed)
 [facility] LCSW Group Therapy Note   Group Date: 01/25/2024 Start Time: 1300 End Time: 1345  Type of Therapy/Topic:  Group Therapy:  Feelings about Diagnosis  Participation Level:  Did Not Attend   Description of Group:    This group will allow patients to explore their thoughts and feelings about diagnoses they have received. Patients will be guided to explore their level of understanding and acceptance of these diagnoses. Facilitator will encourage patients to process their thoughts and feelings about the reactions of others to their diagnosis, and will guide patients in identifying ways to discuss their diagnosis with significant others in their lives. This group will be process-oriented, with patients participating in exploration of their own experiences as well as giving and receiving support and challenge from other group members.   Therapeutic Goals: 1. Patient will demonstrate understanding of diagnosis as evidence by identifying two or more symptoms of the disorder:  2. Patient will be able to express two feelings regarding the diagnosis 3. Patient will demonstrate ability to communicate their needs through discussion and/or role plays  Summary of Patient Progress: Patient did not attend group.  Therapeutic Modalities:   Cognitive Behavioral Therapy Brief Therapy Feelings Identification    Randolm Butte, LCSW

## 2024-01-25 NOTE — Group Note (Signed)
 Date:  01/25/2024 Time:  9:19 PM  Group Topic/Focus:  Emotional Education:   The focus of this group is to discuss what feelings/emotions are, and how they are experienced.     Participation Level:  Active  Participation Quality:  Appropriate  Affect:  Appropriate  Cognitive:  Appropriate  Insight: Appropriate  Engagement in Group:  Engaged  Modes of Intervention:  Education  Additional Comments:  n/a  Temekia Caskey L 01/25/2024, 9:19 PM

## 2024-01-26 ENCOUNTER — Other Ambulatory Visit: Payer: Self-pay

## 2024-01-26 DIAGNOSIS — F200 Paranoid schizophrenia: Secondary | ICD-10-CM | POA: Diagnosis not present

## 2024-01-26 MED ORDER — INVEGA SUSTENNA 234 MG/1.5ML IM SUSY: 234.0000 mg | PREFILLED_SYRINGE | INTRAMUSCULAR | 0 refills | Status: DC

## 2024-01-26 MED ORDER — BENZTROPINE MESYLATE 0.5 MG PO TABS
0.5000 mg | ORAL_TABLET | Freq: Every day | ORAL | 0 refills | Status: DC
  Filled 2024-01-26: qty 30, 30d supply, fill #0

## 2024-01-26 NOTE — Group Note (Signed)
 Date:  01/26/2024 Time:  12:46 PM  Group Topic/Focus:  Goals Group:   The focus of this group is to help patients establish daily goals to achieve during treatment and discuss how the patient can incorporate goal setting into their daily lives to aide in recovery. Wellness Toolbox:   The focus of this group is to discuss various aspects of wellness, balancing those aspects and exploring ways to increase the ability to experience wellness.  Patients will create a wellness toolbox for use upon discharge.    Participation Level:  Did Not Attend   Eric Pineda [facility] 01/26/2024, 12:46 PM

## 2024-01-26 NOTE — Plan of Care (Signed)
   Problem: Health Behavior/Discharge Planning: Goal: Compliance with treatment plan for underlying cause of condition will improve Outcome: Progressing   Problem: Safety: Goal: Periods of time without injury will increase Outcome: Progressing

## 2024-01-26 NOTE — Group Note (Signed)
 Date:  01/26/2024 Time:  9:52 PM  Group Topic/Focus:  Self Esteem Action Plan:   The focus of this group is to help patients create a plan to continue to build self-esteem after discharge.    Participation Level:  Did Not Attend  Participation Quality:   none  Affect:   none  Cognitive:   none  Insight: None  Engagement in Group:   none  Modes of Intervention:   none  Additional Comments:  none   Kosisochukwu Burningham 01/26/2024, 9:52 PM

## 2024-01-26 NOTE — Progress Notes (Signed)
   01/26/24 0828  Psych Admission Type (Psych Patients Only)  Admission Status Involuntary  Psychosocial Assessment  Patient Complaints None  Eye Contact Brief  Facial Expression Flat  Affect Flat  Speech Soft  Interaction Minimal;No initiation  Motor Activity Pacing  Appearance/Hygiene Unremarkable  Behavior Characteristics Cooperative;Pacing  Mood Preoccupied  Thought Process  Coherency WDL  Content Preoccupation  Delusions None reported or observed  Perception WDL  Hallucination None reported or observed  Judgment Poor  Confusion None  Danger to Self  Current suicidal ideation? Denies  Agreement Not to Harm Self Yes  Description of Agreement verbal  Danger to Others  Danger to Others None reported or observed

## 2024-01-26 NOTE — Progress Notes (Signed)
 [facility] MD Progress Note  01/26/2024 4:51 PM Eric Pineda  MRN:  [id number]   Eric Pineda is a 28 year old male who presented to the ER, under IVC. Per the patient sister Eric Pineda [phone]) they have noticed a change in his behaviors within the last two weeks. He is becoming paranoid and distant. He is walking around the house ten times and then the barn for ten times. When they call him or asked what he is doing, he will respond with an attitude or verbally aggressive. They also state, for the last two months, after he eats, he goes outside and vomits and then comes in the home and eat again. They family believes his current behaviors are due to substance use. Patient's UDS is negative for any substances. Patient has a history of schizophrenia and he refuses to take medications, as well as follow up with a provider.    Subjective: Patient's case discussed with multidisciplinary team, all notes and vitals were reviewed.  No reported behavioral issues overnight.  Patient is seen for reassessment, he is more engaged today.  Though he does continue to be guarded, observed pacing around the unit, minimal interaction with others, he is med compliant.  Denies any psychiatric symptoms.  Did not observed to be laughing inappropriately today.  Contacted the patient's sister listed as emergency contact.  She reports that the patient has a history significant for substance use.  No developmental history, did drop out of high school, got into bad company where he started using illicit substances.  States that when at home he walks around the home all day long, he is usually calm and cooperative until his mother does not allow him to have his way, then he may become aggressive.  That the patient does not take medications when he is home.  Have discussed with her beginning the process for legal guardianship as he has impaired insight and judgment.  She verbalizes understanding.  Have discussed discharge  instructions with her.  All questions and concerns addressed.  She will pick up the patient tomorrow.   Principal Problem: Schizophrenia (HCC) Diagnosis: Principal Problem:   Schizophrenia (HCC)  Total Time spent with patient: 20 minutes  Past Psychiatric History: see H&P  Past Medical History:  Past Medical History:  Diagnosis Date   Bizarre behavior 12/02/2019   History reviewed. No pertinent surgical history. Family History: History reviewed. No pertinent family history. Family Psychiatric  History: see H&P Social History:  Social History   Substance and Sexual Activity  Alcohol Use Never     Social History   Substance and Sexual Activity  Drug Use Yes    Social History   Socioeconomic History   Marital status: Single    Spouse name: Not on file   Number of children: Not on file   Years of education: Not on file   Highest education level: Not on file  Occupational History   Not on file  Tobacco Use   Smoking status: Never   Smokeless tobacco: Never   Tobacco comments:    Patient denied current use of tobacco  Vaping Use   Vaping status: Never Used  Substance and Sexual Activity   Alcohol use: Never   Drug use: Yes   Sexual activity: Not on file  Other Topics Concern   Not on file  Social History Narrative   Not on file   Social Drivers of Health   Financial Resource Strain: Not on file  Food Insecurity: Food Insecurity Present (01/17/2024)  Hunger Vital Sign    Worried About Running Out of Food in the Last Year: Often true    Ran Out of Food in the Last Year: Often true  Transportation Needs: No Transportation Needs (01/17/2024)   PRAPARE - Administrator, Civil Service (Medical): No    Lack of Transportation (Non-Medical): No  Physical Activity: Not on file  Stress: Not on file  Social Connections: Unknown (01/17/2024)   Social Connection and Isolation Panel [NHANES]    Frequency of Communication with Friends and Family: Patient  declined    Frequency of Social Gatherings with Friends and Family: Patient declined    Attends Religious Services: Patient declined    Database administrator or Organizations: Patient declined    Attends Engineer, structural: Patient declined    Marital Status: Never married   Additional Social History:                         Sleep: Fair  Appetite:  Fair  Current Medications: Current Facility-Administered Medications  Medication Dose Route Frequency Provider Last Rate Last Admin   acetaminophen  (TYLENOL ) tablet 650 mg  650 mg Oral Q6H PRN Penn, Cicely, NP       alum & mag hydroxide-simeth (MAALOX/MYLANTA) 200-200-20 MG/5ML suspension 30 mL  30 mL Oral Q4H PRN Penn, Alaine Howells, NP       benztropine  (COGENTIN ) tablet 0.5 mg  0.5 mg Oral Daily Penn, Cicely, NP   0.5 mg at 01/26/24 2956   haloperidol  (HALDOL ) tablet 5 mg  5 mg Oral TID PRN Monroe Antigua, NP       And   diphenhydrAMINE  (BENADRYL ) capsule 50 mg  50 mg Oral TID PRN Monroe Antigua, NP       haloperidol  lactate (HALDOL ) injection 10 mg  10 mg Intramuscular TID PRN Penn, Alaine Howells, NP       And   diphenhydrAMINE  (BENADRYL ) injection 50 mg  50 mg Intramuscular TID PRN Penn, Alaine Howells, NP       And   LORazepam  (ATIVAN ) injection 2 mg  2 mg Intramuscular TID PRN Penn, Alaine Howells, NP       haloperidol  lactate (HALDOL ) injection 5 mg  5 mg Intramuscular TID PRN Penn, Alaine Howells, NP       And   diphenhydrAMINE  (BENADRYL ) injection 50 mg  50 mg Intramuscular TID PRN Penn, Alaine Howells, NP       And   LORazepam  (ATIVAN ) injection 2 mg  2 mg Intramuscular TID PRN Penn, Alaine Howells, NP       magnesium  hydroxide (MILK OF MAGNESIA) suspension 30 mL  30 mL Oral Daily PRN Penn, Alaine Howells, NP       [START ON 01/27/2024] paliperidone  (INVEGA  SUSTENNA) injection 156 mg  156 mg Intramuscular Q28 days Byungura, Veronique M, NP       paliperidone  (INVEGA ) 24 hr tablet 9 mg  9 mg Oral Daily Brysan Mcevoy, PA-C   9 mg at 01/26/24 2130    Lab Results:   Results for orders placed or performed during the hospital encounter of 01/17/24 (from the past 48 hours)  RPR     Status: None   Collection Time: 01/24/24  9:46 PM  Result Value Ref Range   RPR Ser Ql NON REACTIVE NON REACTIVE    Comment: Performed at [facility], [address] [place], [place] [address]  HIV Antibody (routine testing w rflx)     Status: None   Collection Time:  01/24/24  9:46 PM  Result Value Ref Range   HIV Screen 4th Generation wRfx Non Reactive Non Reactive    Comment: Performed at [facility], [address] [place], [place] [address]  TSH     Status: None   Collection Time: 01/24/24  9:46 PM  Result Value Ref Range   TSH 3.374 0.350 - 4.500 uIU/mL    Comment: Performed by a 3rd Generation assay with a functional sensitivity of <=0.01 uIU/mL. Performed at [facility], [address] [place], [place] [address]   Vitamin B12     Status: None   Collection Time: 01/24/24  9:46 PM  Result Value Ref Range   Vitamin B-12 230 180 - 914 pg/mL    Comment: (NOTE) This assay is not validated for testing neonatal or myeloproliferative syndrome specimens for Vitamin B12 levels. Performed at [facility], [address] [place], [place] [address]   Folate     Status: None   Collection Time: 01/24/24  9:46 PM  Result Value Ref Range   Folate 8.9 >5.9 ng/mL    Comment: Performed at [facility], [address] [place], [place] [address]    Blood Alcohol level:  Lab Results  Component Value Date   [facility] <15 01/14/2024   ETH <10 01/13/2023    Metabolic Disorder Labs: Lab Results  Component Value Date   HGBA1C 5.0 01/18/2023   MPG 96.8 01/18/2023   MPG 99.67 12/03/2019   No results found for: "PROLACTIN" Lab Results  Component Value Date   CHOL 159 01/18/2023   TRIG 103 01/18/2023   HDL 57 01/18/2023   CHOLHDL 2.8 01/18/2023   VLDL 21 01/18/2023   LDLCALC 81 01/18/2023   LDLCALC 104 (H) 10/04/2020     Physical Findings: AIMS:  , ,  ,  ,    CIWA:    COWS:     Musculoskeletal: Strength & Muscle Tone: within normal limits Gait & Station: normal Patient leans: N/A  Psychiatric Specialty Exam:  Presentation  General Appearance:  Casual  Eye Contact: Minimal  Speech: Other (comment) (selectively mute)  Speech Volume: Normal  Handedness: Right   Mood and Affect  Mood: Anxious (pacing)  Affect: Constricted   Thought Process  Thought Processes: Irrevelant  Descriptions of Associations:Circumstantial  Orientation:Partial  Thought Content:Abstract Reasoning  History of Schizophrenia/Schizoaffective disorder:Yes  Duration of Psychotic Symptoms:No data recorded Hallucinations:No data recorded Ideas of Reference:None  Suicidal Thoughts:No data recorded Homicidal Thoughts:No data recorded  Sensorium  Memory: Immediate Fair; Recent Poor; Remote Poor  Judgment: Poor  Insight: Poor   Executive Functions  Concentration: Poor  Attention Span: Fair  Recall: Poor  Fund of Knowledge: Fair  Language: Fair   Psychomotor Activity  Psychomotor Activity:No data recorded  Assets  Assets: Social Support; Physical Health; Housing   Sleep  Sleep:No data recorded   Physical Exam: Physical Exam Vitals and nursing note reviewed.  Eyes:     Extraocular Movements: Extraocular movements intact.  Pulmonary:     Effort: Pulmonary effort is normal.  Neurological:     Mental Status: He is alert.  Psychiatric:        Attention and Perception: Attention normal.        Mood and Affect: Mood normal. Affect is blunt.        Behavior: Behavior is cooperative.        Cognition and Memory: Cognition is impaired.        Judgment: Judgment is  inappropriate.     Comments: Internally preoccupied, observed laughing inappropriately  Poverty of speech  Lacking insight, judgement impaired/limited    Review of Systems  Psychiatric/Behavioral:   Positive for hallucinations.   All other systems reviewed and are negative.  Blood pressure 96/66, pulse (!) 57, temperature 98.1 F (36.7 C), resp. rate 16, height 5\' 11"  (1.803 m), weight 86.2 kg, SpO2 97%. Body mass index is 26.5 kg/m.   Treatment Plan Summary: 1.    Safety and Monitoring:   --  Involuntary admission to inpatient psychiatric unit for safety, stabilization and treatment -- Daily contact with patient to assess and evaluate symptoms and progress in treatment -- Patient's case to be discussed in multi-disciplinary team meeting -- Observation Level : q15 minute checks -- Vital signs:  q12 hours -- Precautions: assault   2. Psychiatric Diagnoses and Treatment: Patient appears to be at baseline, has been calm and cooperative.  He is med compliant, appears to be more engaged today.  Affect is still constricted, guarded.  Was not observed to be responding to internal stimuli today.  Though he does appear to be internally preoccupied.  Recommendations for ACT team services prior to discharge.  We will continue to monitor for further treatment and stabilization.   -- Continue Invega  9 mg daily for schizophrenia -- Second loading dose of Invega  Sustenna 156 mg IM will be administered on 5/8 -- Continue Cogentin  0.5 mg daily for EPS   --  The risks/benefits/side-effects/alternatives to this medication were discussed in detail with the patient and time was given for questions. The patient consents to medication trial. -- Metabolic profile and EKG monitoring obtained while on an atypical antipsychotic  -- Encouraged patient to participate in unit milieu and in scheduled group therapies -- Short Term Goals: Ability to identify changes in lifestyle to reduce recurrence of condition will improve, Ability to verbalize feelings will improve, Ability to disclose and discuss suicidal ideas, Ability to demonstrate self-control will improve, Ability to identify and develop effective coping  behaviors will improve, Ability to maintain clinical measurements within normal limits will improve, Compliance with prescribed medications will improve, and Ability to identify triggers associated with substance abuse/mental health issues will improve -- Long Term Goals: Improvement in symptoms so as ready for discharge        3. Medical Issues Being Addressed:               None    4. Discharge Planning:   -- Social work and case management to assist with discharge planning and identification of hospital follow-up needs prior to discharge -- Estimated LOS: 5-7 days -- Discharge Concerns: Need to establish a safety plan; Medication compliance and effectiveness -- Discharge Goals: Return home with outpatient referrals for mental health follow-up including medication management/psychotherapy   Sheilda Deputy, PA-C 01/26/2024, 4:51 PM

## 2024-01-26 NOTE — Group Note (Signed)
 Date:  01/26/2024 Time:  7:23 PM  Group Topic/Focus:  Making Healthy Choices:   The focus of this group is to help patients identify negative/unhealthy choices they were using prior to admission and identify positive/healthier coping strategies to replace them upon discharge.    Participation Level:  Minimal  Participation Quality:  Appropriate  Affect:  Appropriate  Cognitive:  Appropriate  Insight: Appropriate  Engagement in Group:  Engaged  Modes of Intervention:  Activity and Socialization  Additional Comments:    Laverne Potter 01/26/2024, 7:23 PM

## 2024-01-26 NOTE — Group Note (Signed)
 LCSW Group Therapy Note   Group Date: 01/26/2024 Start Time: 1300 End Time: 1425   Type of Therapy and Topic:  Group Therapy: Challenging Core Beliefs  Participation Level:  Did Not Attend  Description of Group:  Patients were educated about core beliefs and asked to identify one harmful core belief that they have. Patients were asked to explore from where those beliefs originate. Patients were asked to discuss how those beliefs make them feel and the resulting behaviors of those beliefs. They were then be asked if those beliefs are true and, if so, what evidence they have to support them. Lastly, group members were challenged to replace those negative core beliefs with helpful beliefs.   Therapeutic Goals:   1. Patient will identify harmful core beliefs and explore the origins of such beliefs. 2. Patient will identify feelings and behaviors that result from those core beliefs. 3. Patient will discuss whether such beliefs are true. 4.  Patient will replace harmful core beliefs with helpful ones.  Summary of Patient Progress:  Patient did not attend group.   Therapeutic Modalities: Cognitive Behavioral Therapy; Solution-Focused Therapy   Jaramiah Bossard M Oshay Stranahan, Milinda Allen 01/26/2024  2:29 PM

## 2024-01-26 NOTE — BHH Counselor (Signed)
 CSW spoke with the patient and informed of need to speak with family for discharge planning.  Patient provided verbal permission for CSW to speak with his mother, Clydia Dart.  CSW attempted to contact the patient's mother twice and call went straight to a voicemail that was not set up.  CSW spoke with the patient and informed of the dilemma.  Patient provided permission for CSW to reach out to the patient's sister [name], [phone].  Sister reports that the patient can return to his home.  Family denies access to weapons.    Sister reports that she is at court presently for an IVC hearing for the patient at [facility].    Sister wanted CSW to review medications.  Sister expressed concern that patient will not take medications at discharge.    Shasta Deist, MSW, LCSW 01/26/2024 1:22 PM

## 2024-01-26 NOTE — Plan of Care (Signed)
   Problem: Education: Goal: Emotional status will improve Outcome: Progressing Goal: Mental status will improve Outcome: Progressing

## 2024-01-27 DIAGNOSIS — F200 Paranoid schizophrenia: Secondary | ICD-10-CM | POA: Diagnosis not present

## 2024-01-27 NOTE — Group Note (Signed)
 Date:  01/27/2024 Time:  9:46 AM  Group Topic/Focus:  Goals Group:   The focus of this group is to help patients establish daily goals to achieve during treatment and discuss how the patient can incorporate goal setting into their daily lives to aide in recovery.    Participation Level:  Did Not Attend   Eric Pineda 01/27/2024, 9:46 AM

## 2024-01-27 NOTE — Progress Notes (Signed)
 Patient ID: Eric Pineda, male   DOB: 1995-11-04, 28 y.o.   MRN: [id number] Discharge note: Suicide safety plan and survey refused by patient. RN met with pt and reviewed pt's discharge instructions. Pt verbalized understanding of discharge instructions and pt did not have any questions. RN reviewed and provided pt with a copy of SRA, AVS and Transition Record. RN returned pt's belongings to pt. Prescriptions and samples were given to pt. Pt denied SI/HI/AVH and voiced no concerns. Patient's sister is coming to pick patient up.

## 2024-01-27 NOTE — [facility] Suicide Risk Assessment (Signed)
 Suicide Risk Assessment  Discharge Assessment    [facility] Discharge Suicide Risk Assessment   Principal Problem: Schizophrenia [facility]) Discharge Diagnoses: Principal Problem:   Schizophrenia (HCC)   Total Time spent with patient: 1 hour  Musculoskeletal: Strength & Muscle Tone: within normal limits Gait & Station: normal Patient leans: N/A  Psychiatric Specialty Exam  Presentation  General Appearance:  Casual  Eye Contact: Fleeting  Speech: Clear and Coherent; Normal Rate  Speech Volume: Normal  Handedness: Right   Mood and Affect  Mood: Euthymic  Duration of Depression Symptoms: Greater than two weeks  Affect: Constricted   Thought Process  Thought Processes: Goal Directed  Descriptions of Associations:Intact  Orientation:Other (comment) (self)  Thought Content:Other (comment) (simple)  History of Schizophrenia/Schizoaffective disorder:Yes  Duration of Psychotic Symptoms:No data recorded Hallucinations:Hallucinations: None  Ideas of Reference:None  Suicidal Thoughts:Suicidal Thoughts: No  Homicidal Thoughts:Homicidal Thoughts: No   Sensorium  Memory: Immediate Fair  Judgment: Impaired  Insight: Lacking   Executive Functions  Concentration: Fair  Attention Span: Fair  Recall: Fair  Fund of Knowledge: Poor  Language: Poor   Psychomotor Activity  Psychomotor Activity: Psychomotor Activity: Restlessness; Increased   Assets  Assets: Housing; Physical Health; Social Support   Sleep  Sleep: Sleep: Good   Physical Exam: Physical Exam Vitals and nursing note reviewed.  Eyes:     Extraocular Movements: Extraocular movements intact.  Pulmonary:     Effort: Pulmonary effort is normal.  Neurological:     Mental Status: He is alert. Mental status is at baseline.  Psychiatric:        Attention and Perception: Attention and perception normal.        Mood and Affect: Mood normal. Affect is blunt.        Speech:  Speech normal.        Behavior: Behavior is cooperative.        Cognition and Memory: Cognition is impaired. He exhibits impaired recent memory and impaired remote memory.        Judgment: Judgment is impulsive.     Comments: Psychomotor agitation, restlessness, but cooperative Judgment and insight limited     Review of Systems  All other systems reviewed and are negative.  Blood pressure 101/69, pulse (!) 54, temperature (!) 97.5 F (36.4 C), resp. rate 14, height 5\' 11"  (1.803 m), weight 86.2 kg, SpO2 97%. Body mass index is 26.5 kg/m.  Mental Status Per Nursing Assessment::   On Admission:     Demographic Factors:  Male and Unemployed  Loss Factors: NA  Historical Factors: Impulsivity  Risk Reduction Factors:   Living with another person, especially a relative and Positive social support  Continued Clinical Symptoms:  Schizophrenia:   Paranoid or undifferentiated type Unstable or Poor Therapeutic Relationship Previous Psychiatric Diagnoses and Treatments  Cognitive Features That Contribute To Risk:  Loss of executive function    Suicide Risk:  Minimal: No identifiable suicidal ideation.  Patients presenting with no risk factors but with morbid ruminations; may be classified as minimal risk based on the severity of the depressive symptoms   Follow-up Information     [facility] Follow up.   Why: Referral was sent for ACT services, please follow up after discharge to determine if appropriate for services. Contact information: [address] [place] [address] [phone]         [facility] Follow up.   Why: Walk in appointments are from 8AM to  2PM, Monday, Wednesday and Friday on a first come first serve basis, please arrive early for an appointment. Contact information: [address] [place] [address] [phone]                 Plan Of Care/Follow-up recommendations:  #  It is recommended to the patient to continue psychiatric medications as prescribed, after discharge from the hospital.   # It is recommended to the patient to follow up with your outpatient psychiatric provider and PCP. # It was discussed with the patient, the impact of alcohol, drugs, tobacco have been there overall psychiatric and medical wellbeing, and total abstinence from substance use was recommended. # Prescriptions provided or sent directly to preferred pharmacy at discharge. Patient agreeable to plan. Given the opportunity to ask questions. Appears to feel comfortable with discharge.  # In the event of worsening symptoms, the patient is instructed to call the crisis hotline (988), 911 and or go to the nearest ED for appropriate evaluation and treatment of symptoms. To follow-up with primary care provider for other medical issues, concerns and or health care needs # Patient was discharged home with a plan to follow up as noted above.    Jentri Aye, PA-C 01/27/2024, 10:52 AM

## 2024-01-27 NOTE — Group Note (Signed)
 Date:  01/27/2024 Time:  6:49 PM  Group Topic/Focus:  Outside activities    Participation Level:  Did Not Attend   Eric Pineda 01/27/2024, 6:49 PM

## 2024-01-27 NOTE — Progress Notes (Signed)
 Pt is preoccupied, and more visible in the milieu and not pacing the unit as he was on admission.  Pt was able to cone out to the visitors'' room when called out for snack and mingle with other patient without engagement.  He reported feeling good, denied AVH, SI/HI, depression and anxiety    01/27/24 0000  Psych Admission Type (Psych Patients Only)  Admission Status Involuntary  Psychosocial Assessment  Patient Complaints None  Eye Contact Brief  Facial Expression Flat  Affect Flat  Speech Soft  Interaction Minimal  Motor Activity Pacing  Appearance/Hygiene Unremarkable  Behavior Characteristics Cooperative;Pacing  Mood Preoccupied  Thought Process  Coherency WDL  Content Preoccupation  Delusions None reported or observed  Perception WDL  Hallucination None reported or observed  Judgment Poor  Confusion None  Danger to Self  Current suicidal ideation? Denies  Agreement Not to Harm Self Yes  Description of Agreement Verbal  Danger to Others  Danger to Others None reported or observed   Problem: Education: Goal: Knowledge of [place] Education information/materials will improve Outcome: Progressing Goal: Emotional status will improve Outcome: Progressing Goal: Mental status will improve Outcome: Progressing Goal: Verbalization of understanding the information provided will improve Outcome: Progressing

## 2024-01-27 NOTE — Discharge Summary (Signed)
 Physician Discharge Summary Note  Patient:  Eric Pineda is an 28 y.o., male MRN:  [id number] DOB:  06-Sep-1996 Patient phone:  [phone] (home)  Patient address:   [address] [place] [address],  Total Time spent with patient: 1 hour  Date of Admission:  01/17/2024 Date of Discharge: 01/27/2024  Reason for Admission:  Eric Pineda is a 28 year old male who presented to the ER, under IVC. Per the patient sister Eric Pineda [phone]) they have noticed a change in his behaviors within the last two weeks. He is becoming paranoid and distant. He is walking around the house ten times and then the barn for ten times. When they call him or asked what he is doing, he will respond with an attitude or verbally aggressive. They also state, for the last two months, after he eats, he goes outside and vomits and then comes in the home and eat again. They family believes his current behaviors are due to substance use. Patient's UDS is negative for any substances. Patient has a history of schizophrenia and he refuses to take medications, as well as follow up with a provider.   Principal Problem: Schizophrenia [facility]) Discharge Diagnoses: Principal Problem:   Schizophrenia [facility])   Past Psychiatric History: see H&P  Past Medical History:  Past Medical History:  Diagnosis Date   Bizarre behavior 12/02/2019   History reviewed. No pertinent surgical history. Family History: History reviewed. No pertinent family history. Family Psychiatric  History: see H&p Social History:  Social History   Substance and Sexual Activity  Alcohol Use Never     Social History   Substance and Sexual Activity  Drug Use Yes    Social History   Socioeconomic History   Marital status: Single    Spouse name: Not on file   Number of children: Not on file   Years of education: Not on file   Highest education level: Not on file  Occupational History   Not on file  Tobacco Use   Smoking status:  Never   Smokeless tobacco: Never   Tobacco comments:    Patient denied current use of tobacco  Vaping Use   Vaping status: Never Used  Substance and Sexual Activity   Alcohol use: Never   Drug use: Yes   Sexual activity: Not on file  Other Topics Concern   Not on file  Social History Narrative   Not on file   Social Drivers of Health   Financial Resource Strain: Not on file  Food Insecurity: Food Insecurity Present (01/17/2024)   Hunger Vital Sign    Worried About Running Out of Food in the Last Year: Often true    Ran Out of Food in the Last Year: Often true  Transportation Needs: No Transportation Needs (01/17/2024)   PRAPARE - Administrator, Civil Service (Medical): No    Lack of Transportation (Non-Medical): No  Physical Activity: Not on file  Stress: Not on file  Social Connections: Unknown (01/17/2024)   Social Connection and Isolation Panel [NHANES]    Frequency of Communication with Friends and Family: Patient declined    Frequency of Social Gatherings with Friends and Family: Patient declined    Attends Religious Services: Patient declined    Database administrator or Organizations: Patient declined    Attends Banker Meetings: Patient declined    Marital Status: Never married    Hospital Course:   During the course of hospitalization, pt received daily multiple modalities  of treatments consisting of Psychopharmacology, individual, group, psychoeducational, recreational, milieu therapy, including case management to coordinate pts inpatient and outpatient care and in concert with weekly treatment team meetings. Discharge planning was initiated on the day of admission to ensure a safe discharge. The presenting symptoms were closely monitored and medications were started as indicated. There were no complications. The principal reasons for hospitalization consisted of psychosis, history of schizophrenia  Medications addressing the principal problem  were initiated with improvement in severity sufficient to discharge to a lower level of care. Patient was started on Invega  3 mg daily for schizophrenia, and was up titrated to 9 mg daily throughout his stay on the unit.  Patient was administered for slow dose of Invega  Sustenna 234 mg IM, and was administered final loading dose of Invega  Sustenna 156 mg IM on 5/8 prior to discharge.  Recommend maintenance dose of Invega  Sustenna 234 mg IM q. 28 days, next dose is due 02/24/2024. At baseline he continues to take around the milieu, minimal engagement however seems to be improving.  Observed to be responding to internal stimuli, laughing inappropriately at times though he denies all psychiatric symptoms.  At the referral has been completed by social work team.  It is intended for the outpatient provider to determine whether to continue these medications, or if these medication needs to be titrated for continued outpatient therapy.  All identified psychiatric, general medical/surgical psychosocial obstacles to discharge were addressed. Patient tolerated these medications with no noted side effects. All these medications were titrated to discharge levels (Please see discharge medications below). Patient showed slow but steady and sustained symptomatic improvement before discharge. The patient denied suicidal, homicidal ideations and hallucinations. Family session held to determine baseline behaviors and for safe discharge plan.  On the day of discharge 01/27/2024, following sustained improvement in the affect of this patient, continued report of euthymic mood, repeated denial of suicidal, homicidal and other violent ideations, adequate interaction with peers, active participation in groups while on the unit, and denial of adverse reactions from the medications, the treatment team decided that Eric Pineda, Eric Pineda was stable for discharge back  home with scheduled mental health treatment as below. A comprehensive risk  assessment was done prior to discharge and shows that patient is at low risk for suicide or violence and will continue to be if patient complies with the treatment recommendations, medications and therapy.  At the time of discharge, patient no longer meeting criteria for IVC, patient is not an imminent danger to self or others. patient agrees to call Crisis Services, 911 and/or return to the ED if safety cannot be maintained outside the hospital setting. Discharge medications reviewed with patient, explanation of indication, risks/benefits and side effects profiles. The patient verbalized understanding and is in agreement with the discharge plan.  Physical Findings: AIMS:  , ,  ,  ,    CIWA:    COWS:     Musculoskeletal: Strength & Muscle Tone: within normal limits Gait & Station: normal Patient leans: N/A   Psychiatric Specialty Exam:  Presentation  General Appearance:  Casual  Eye Contact: Fleeting  Speech: Clear and Coherent; Normal Rate  Speech Volume: Normal  Handedness: Right   Mood and Affect  Mood: Euthymic  Affect: Constricted   Thought Process  Thought Processes: Goal Directed  Descriptions of Associations:Intact  Orientation:Other (comment) (self)  Thought Content:Other (comment) (simple)  History of Schizophrenia/Schizoaffective disorder:Yes  Duration of Psychotic Symptoms:No data recorded Hallucinations:Hallucinations: None  Ideas of Reference:None  Suicidal Thoughts:Suicidal  Thoughts: No  Homicidal Thoughts:Homicidal Thoughts: No   Sensorium  Memory: Immediate Fair  Judgment: Impaired  Insight: Lacking   Executive Functions  Concentration: Fair  Attention Span: Fair  Recall: Fair  Fund of Knowledge: Poor  Language: Poor   Psychomotor Activity  Psychomotor Activity: Psychomotor Activity: Restlessness; Increased   Assets  Assets: Housing; Physical Health; Social Support   Sleep  Sleep: Sleep:  Good    Physical Exam: Physical Exam Vitals and nursing note reviewed.  Eyes:     Extraocular Movements: Extraocular movements intact.  Pulmonary:     Effort: Pulmonary effort is normal.  Neurological:     Mental Status: He is alert. Mental status is at baseline.  Psychiatric:        Attention and Perception: Attention normal.        Mood and Affect: Mood normal. Affect is blunt.        Speech: Speech normal.        Behavior: Behavior is cooperative.        Cognition and Memory: Cognition is impaired. He exhibits impaired recent memory and impaired remote memory.        Judgment: Judgment is impulsive.     Comments: Insight and judgment poor  Still with psychomotor agitation, restlessness, he is however cooperative    ROS Blood pressure 101/69, pulse (!) 54, temperature (!) 97.5 F (36.4 C), resp. rate 14, height 5\' 11"  (1.803 m), weight 86.2 kg, SpO2 97%. Body mass index is 26.5 kg/m.   Social History   Tobacco Use  Smoking Status Never  Smokeless Tobacco Never  Tobacco Comments   Patient denied current use of tobacco   Tobacco Cessation:  N/A, patient does not currently use tobacco products   Blood Alcohol level:  Lab Results  Component Value Date   [facility] <15 01/14/2024   ETH <10 01/13/2023    Metabolic Disorder Labs:  Lab Results  Component Value Date   HGBA1C 5.0 01/18/2023   MPG 96.8 01/18/2023   MPG 99.67 12/03/2019   No results found for: "PROLACTIN" Lab Results  Component Value Date   CHOL 159 01/18/2023   TRIG 103 01/18/2023   HDL 57 01/18/2023   CHOLHDL 2.8 01/18/2023   VLDL 21 01/18/2023   LDLCALC 81 01/18/2023   LDLCALC 104 (H) 10/04/2020    See Psychiatric Specialty Exam and Suicide Risk Assessment completed by Attending Physician prior to discharge.  Discharge destination:  Home  Is patient on multiple antipsychotic therapies at discharge:  No   Has Patient had three or more failed trials of antipsychotic monotherapy by history:   No  Recommended Plan for Multiple Antipsychotic Therapies: NA   Allergies as of 01/27/2024   No Known Allergies      Medication List     TAKE these medications      Indication  benztropine  0.5 MG tablet Commonly known as: COGENTIN  Take 1 tablet (0.5 mg total) by mouth daily.  Indication: Extrapyramidal Reaction caused by Medications   Invega  Sustenna 234 MG/1.5ML injection Generic drug: paliperidone  Inject 234 mg into the muscle every 28 (twenty-eight) days. Next dose due q28 days, 02/24/2024 Start taking on: February 24, 2024  Indication: Schizoaffective Disorder        Follow-up Information     [facility] Follow up.   Why: Referral was sent for ACT services, please follow up after discharge to determine if appropriate for services. Contact information: [address] [place]  [place] [address] [phone]         [facility]  Follow up.   Why: Walk in appointments are from 8AM to 2PM, Monday, Wednesday and Friday on a first come first serve basis, please arrive early for an appointment. Contact information: [address] [place] [address] [phone]                 Follow-up recommendations:  # It is recommended to the patient to continue psychiatric medications as prescribed, after discharge from the hospital.   # It is recommended to the patient to follow up with your outpatient psychiatric provider and PCP. # It was discussed with the patient, the impact of alcohol, drugs, tobacco have been there overall psychiatric and medical wellbeing, and total abstinence from substance use was recommended. # Prescriptions provided or sent directly to preferred pharmacy at discharge. Patient agreeable to plan. Given the opportunity to ask questions. Appears to feel comfortable with discharge.  # In the event of worsening symptoms, the patient is instructed to call the crisis hotline (988), 911 and or go  to the nearest ED for appropriate evaluation and treatment of symptoms. To follow-up with primary care provider for other medical issues, concerns and or health care needs # Patient was discharged home with a plan to follow up as noted above.    SignedSheilda Deputy, PA-C 01/27/2024, 10:56 AM

## 2024-01-27 NOTE — Plan of Care (Signed)

## 2024-01-27 NOTE — Plan of Care (Signed)
  Problem: Education: Goal: Knowledge of [place] [facility] Education information/materials will improve 01/27/2024 1805 by Benedetta Bradley, RN Outcome: Adequate for Discharge 01/27/2024 1346 by Benedetta Bradley, RN Outcome: Adequate for Discharge Goal: Emotional status will improve 01/27/2024 1805 by Benedetta Bradley, RN Outcome: Adequate for Discharge 01/27/2024 1346 by Benedetta Bradley, RN Outcome: Adequate for Discharge Goal: Mental status will improve 01/27/2024 1805 by Benedetta Bradley, RN Outcome: Adequate for Discharge 01/27/2024 1346 by Benedetta Bradley, RN Outcome: Adequate for Discharge Goal: Verbalization of understanding the information provided will improve 01/27/2024 1805 by Benedetta Bradley, RN Outcome: Adequate for Discharge 01/27/2024 1346 by Benedetta Bradley, RN Outcome: Adequate for Discharge   Problem: Activity: Goal: Interest or engagement in activities will improve 01/27/2024 1805 by Benedetta Bradley, RN Outcome: Adequate for Discharge 01/27/2024 1346 by Benedetta Bradley, RN Outcome: Adequate for Discharge Goal: Sleeping patterns will improve 01/27/2024 1805 by Benedetta Bradley, RN Outcome: Adequate for Discharge 01/27/2024 1346 by Benedetta Bradley, RN Outcome: Adequate for Discharge   Problem: Coping: Goal: Ability to verbalize frustrations and anger appropriately will improve 01/27/2024 1805 by Benedetta Bradley, RN Outcome: Adequate for Discharge 01/27/2024 1346 by Benedetta Bradley, RN Outcome: Adequate for Discharge Goal: Ability to demonstrate self-control will improve 01/27/2024 1805 by Benedetta Bradley, RN Outcome: Adequate for Discharge 01/27/2024 1346 by Benedetta Bradley, RN Outcome: Adequate for Discharge   Problem: Health Behavior/Discharge Planning: Goal: Identification of resources available to assist in meeting health care needs will improve 01/27/2024 1805 by Benedetta Bradley, RN Outcome: Adequate for Discharge 01/27/2024 1346 by Benedetta Bradley, RN Outcome: Adequate for Discharge Goal: Compliance with treatment plan for underlying  cause of condition will improve 01/27/2024 1805 by Benedetta Bradley, RN Outcome: Adequate for Discharge 01/27/2024 1346 by Benedetta Bradley, RN Outcome: Adequate for Discharge   Problem: Physical Regulation: Goal: Ability to maintain clinical measurements within normal limits will improve 01/27/2024 1805 by Benedetta Bradley, RN Outcome: Adequate for Discharge 01/27/2024 1346 by Benedetta Bradley, RN Outcome: Adequate for Discharge   Problem: Safety: Goal: Periods of time without injury will increase 01/27/2024 1805 by Benedetta Bradley, RN Outcome: Adequate for Discharge 01/27/2024 1346 by Benedetta Bradley, RN Outcome: Adequate for Discharge

## 2024-01-27 NOTE — Group Note (Signed)
 [facility] LCSW Group Therapy Note   Group Date: 01/27/2024 Start Time: 1300 End Time: 1400   Type of Therapy/Topic:  Group Therapy:  Balance in Life  Participation Level:  Did Not Attend   Description of Group:    This group will address the concept of balance and how it feels and looks when one is unbalanced. Patients will be encouraged to process areas in their lives that are out of balance, and identify reasons for remaining unbalanced. Facilitators will guide patients utilizing problem- solving interventions to address and correct the stressor making their life unbalanced. Understanding and applying boundaries will be explored and addressed for obtaining  and maintaining a balanced life. Patients will be encouraged to explore ways to assertively make their unbalanced needs known to significant others in their lives, using other group members and facilitator for support and feedback.  Therapeutic Goals: Patient will identify two or more emotions or situations they have that consume much of in their lives. Patient will identify signs/triggers that life has become out of balance:  Patient will identify two ways to set boundaries in order to achieve balance in their lives:  Patient will demonstrate ability to communicate their needs through discussion and/or role plays  Summary of Patient Progress:    X    Therapeutic Modalities:   Cognitive Behavioral Therapy Solution-Focused Therapy Assertiveness Training   Larri Ply, LCSW

## 2024-01-27 NOTE — Progress Notes (Signed)
  [facility] Adult Case Management Discharge Plan :  Will you be returning to the same living situation after discharge:  Yes,  pt reports that he is returning home.  At discharge, do you have transportation home?: Yes,  pt's sister is providing transportation.  Do you have the ability to pay for your medications: Yes,  [employer]  Release of information consent forms completed and in the chart;  Patient's signature needed at discharge.  Patient to Follow up at:  Follow-up Information     [facility] Follow up.   Why: Referral was sent for ACT services, please follow up after discharge to determine if appropriate for services. Contact information: [address] [place] [address] [phone]         [facility] Follow up.   Why: Walk in appointments are from 8AM to 2PM, Monday, Wednesday and Friday on a first come first serve basis, please arrive early for an appointment. Contact information: [address] [place] [address] [phone]                 Next level of care provider has access to [facility] Link:no  Safety Planning and Suicide Prevention discussed: Yes,  SPE completed with the patient.  Patient declined collateral contact.      Has patient been referred to the Quitline?: Patient does not use tobacco/nicotine  products  Patient has been referred for addiction treatment: No known substance use disorder.  Larri Ply, LCSW 01/27/2024, 9:18 AM

## 2024-08-13 ENCOUNTER — Emergency Department
Admission: EM | Admit: 2024-08-13 | Discharge: 2024-08-14 | Disposition: A | Discharge status: Other Acute Inpatient Hospital | Payer: MEDICAID | Attending: Emergency Medicine | Admitting: Emergency Medicine

## 2024-08-13 ENCOUNTER — Other Ambulatory Visit: Payer: Self-pay

## 2024-08-13 DIAGNOSIS — F203 Undifferentiated schizophrenia: Secondary | ICD-10-CM | POA: Diagnosis present

## 2024-08-13 DIAGNOSIS — R462 Strange and inexplicable behavior: Secondary | ICD-10-CM

## 2024-08-13 LAB — COMPREHENSIVE METABOLIC PANEL WITH GFR
ALT: 28 U/L (ref 0–44)
AST: 23 U/L (ref 15–41)
Albumin: 5 g/dL (ref 3.5–5.0)
Alkaline Phosphatase: 91 U/L (ref 38–126)
Anion gap: 14 (ref 5–15)
BUN: 11 mg/dL (ref 6–20)
CO2: 25 mmol/L (ref 22–32)
Calcium: 9.9 mg/dL (ref 8.9–10.3)
Chloride: 106 mmol/L (ref 98–111)
Creatinine, Ser: 1.11 mg/dL (ref 0.61–1.24)
GFR, Estimated: >60 mL/min (ref >60)
Glucose, Bld: 88 mg/dL (ref 70–99)
Potassium: 4.4 mmol/L (ref 3.5–5.1)
Sodium: 145 mmol/L (ref 135–145)
Total Bilirubin: 0.7 mg/dL (ref 0.0–1.2)
Total Protein: 7.8 g/dL (ref 6.5–8.1)

## 2024-08-13 LAB — CBC
HCT: 41.4 % (ref 39.0–52.0)
Hemoglobin: 14.5 g/dL (ref 13.0–17.0)
MCH: 30.2 pg (ref 26.0–34.0)
MCHC: 35 g/dL (ref 30.0–36.0)
MCV: 86.3 fL (ref 80.0–100.0)
Platelets: 336 K/uL (ref 150–400)
RBC: 4.8 MIL/uL (ref 4.22–5.81)
RDW: 12.3 % (ref 11.5–15.5)
WBC: 15.8 K/uL — ABNORMAL HIGH (ref 4.0–10.5)
nRBC: 0 % (ref 0.0–0.2)

## 2024-08-13 LAB — URINE DRUG SCREEN
Amphetamines: NEGATIVE
Barbiturates: NEGATIVE
Benzodiazepines: NEGATIVE
Cocaine: NEGATIVE
Fentanyl: NEGATIVE
Methadone Scn, Ur: NEGATIVE
Opiates: NEGATIVE
Tetrahydrocannabinol: NEGATIVE

## 2024-08-13 LAB — ETHANOL: Alcohol, Ethyl (B): <15 mg/dL (ref <15)

## 2024-08-13 MED ORDER — DIAZEPAM 5 MG/ML IJ SOLN: 10.0000 mg | Freq: Four times a day (QID) | INTRAMUSCULAR | Status: DC | PRN

## 2024-08-13 MED ORDER — ZIPRASIDONE MESYLATE 20 MG IM SOLR: 10.0000 mg | Freq: Four times a day (QID) | INTRAMUSCULAR | Status: DC | PRN

## 2024-08-13 MED ORDER — LORAZEPAM 1 MG PO TABS: 1.0000 mg | ORAL_TABLET | ORAL | Status: DC | PRN

## 2024-08-13 NOTE — ED Triage Notes (Signed)
 Patient accompanied by [facility] PD with IVC paperwork. PD picked pt up from side of [address]. Mother called because she fears for his safety. He has been under IVC in the past and has diagnoses of depression and schizophrenia. Pt was discharged 2 weeks ago and mother reports he threw all of his medications in the trash can upon arriving home. She reports he will stare blankly for long periods of time, walk laps around the house, and laugh incessantly at night when it is time for bed. Pt denies SI/HI and A/V hallucinations. Pt not speaking in triage, just nodding his head in reply to questions.

## 2024-08-13 NOTE — Consult Note (Signed)
 [facility] Consult Note  Patient Name: Eric Pineda MRN: [id number] DOB: Feb 06, 1996 DATE OF Consult: 08/13/2024  PRIMARY PSYCHIATRIC DIAGNOSES   1.  Undifferentiated Schizophrenia   RECOMMENDATIONS  Recommendations: Medication recommendations: Given that it is unclear what meds patient had been taking during his last hospitalization a few weeks ago, or whether he's recently received a long-acting preparation, do not begin any scheduled meds in the ED.  For anxiety:   Ativan , 1 mg q4h PRN;  For severe agitation/aggression:  Geodon  10 mg IM q6h PRN and Valium  10 mg IM q6h PRN Non-Medication/therapeutic recommendations: Patient not responding coherently to environmental stimuli, so continue with close observation, as per ED protocol, until patient can be safely admitted to Psychiatry.  Continue with matter-of-fact emotional support in ED, pending transfer Is inpatient psychiatric hospitalization recommended for this patient? Yes (Explain why): Patient is psychotic, not responding to routine communication, and was sitting beside the road of a busy highway, and he cannot explain why that would be a dangerous thing to do.  Given his schizophrenia, he meets IVC criteria, and he should be admitted for safety and stabilization Is another care setting recommended for this patient? (examples may include Crisis Stabilization Unit, Residential/Recovery Treatment, ALF/SNF, Memory Care Unit)  No (Explain why): As above From a psychiatric perspective, is this patient appropriate for discharge to an outpatient setting/resource or other less restrictive environment for continued care?  No (Explain why): As above Follow-Up Telepsychiatry C/L services: We will sign off for now. Please re-consult our service if needed for any concerning changes in the patient's condition, discharge planning, or questions. Communication: Treatment team members (and family members if applicable) who were involved in treatment/care  discussions and planning, and with whom we spoke or engaged with via secure text/chat, include the following: Secure message sent to Dr. Ester Sharps, ED attending, and ED staff, outlining recommendations  Thank you for involving us  in the care of this patient. If you have any additional questions or concerns, please call [phone] and ask for the provider on-call.   TELEPSYCHIATRY ATTESTATION & CONSENT   As the provider for this telehealth consult, I attest that I verified the patient's identity using two separate identifiers, introduced myself to the patient, provided my credentials, disclosed my location, and performed this encounter via a HIPAA-compliant, real-time, face-to-face, two-way, interactive audio and video platform and with the full consent and agreement of the patient (or guardian as applicable.)   Patient physical location: ED, [facility]. Telehealth provider physical location: home office in state of [place] .  Video start time: 2240 EST  Video end time: 2250h EST    Total time spent in this encounter was 30 minutes, including record review, clinical interview, behavior observations, discussion of impressions and recommendations (including medications and hospitalization), and consultation/communication with relevant parties   IDENTIFYING DATA  Eric Pineda is a 28 y.o. year-old male for whom a psychiatric consultation has been ordered by the primary provider. The patient was identified using two separate identifiers.  CHIEF COMPLAINT/REASON FOR CONSULT   I'm just here for a routine check-up.  HISTORY OF PRESENT ILLNESS (HPI)   The patient presents on an IVC after having been picked up by police [place] the [place] [address], where he had apparently been sitting for a very long period of time.  By history, has long suffered from schizophrenia, along with a long history of lack of participation in any type of treatment.  Patient himself essentially only  repeated I don't  know to any questions asked.  He did admit that he's not been taking medications.  He said that he sometimes drinks ETOH, but denied other drugs.  Both BAL and UDS negative.  He could not give any answer--or it appeared, any thought--to the issue of the dangerousness of his actions, putting himself at the side of the road, without clear thoughts about what to do next.  Patient appeared that he could be responding to internal stimuli.  Per IVC and Epic, patient has been out of the hospital for about 2 weeks.  He has taken no medications since coming up.  Over the 2 weeks, his behavior has been becoming increasingly bizarre, with extensive pacing around the house and persistent laughter when trying to fall asleep.  Today he left his home without notice, and it was only a few hours later that the police were notified and brought patient to the ED.  He has consistently been (essentialy) non-communicative since coming to ED, but did not appear catatonic.   PAST PSYCHIATRIC HISTORY  As above Otherwise as per HPI above.  PAST MEDICAL HISTORY  Past Medical History:  Diagnosis Date   Bizarre behavior 12/02/2019     HOME MEDICATIONS  Facility Ordered Medications  Medication   LORazepam  (ATIVAN ) tablet 1 mg   ziprasidone  (GEODON ) injection 10 mg   diazepam  (VALIUM ) injection 10 mg   PTA Medications  Medication Sig   benztropine  (COGENTIN ) 0.5 MG tablet Take 1 tablet (0.5 mg total) by mouth daily.   paliperidone  (INVEGA  SUSTENNA) 234 MG/1.5ML injection Inject 234 mg into the muscle every 28 (twenty-eight) days. Next dose due q28 days, 02/24/2024   No meds recently  ALLERGIES  No Known Allergies  SOCIAL & SUBSTANCE USE HISTORY  Social History   Socioeconomic History   Marital status: Single    Spouse name: Not on file   Number of children: Not on file   Years of education: Not on file   Highest education level: Not on file  Occupational History   Not on file  Tobacco Use    Smoking status: Never   Smokeless tobacco: Never   Tobacco comments:    Patient denied current use of tobacco  Vaping Use   Vaping status: Never Used  Substance and Sexual Activity   Alcohol use: Never   Drug use: Yes   Sexual activity: Not on file  Other Topics Concern   Not on file  Social History Narrative   Not on file   Social Drivers of Health   Financial Resource Strain: Not on file  Food Insecurity: Food Insecurity Present (01/17/2024)   Hunger Vital Sign    Worried About Running Out of Food in the Last Year: Often true    Ran Out of Food in the Last Year: Often true  Transportation Needs: No Transportation Needs (01/17/2024)   PRAPARE - Administrator, Civil Service (Medical): No    Lack of Transportation (Non-Medical): No  Physical Activity: Not on file  Stress: Not on file  Social Connections: Unknown (01/17/2024)   Social Connection and Isolation Panel    Frequency of Communication with Friends and Family: Patient declined    Frequency of Social Gatherings with Friends and Family: Patient declined    Attends Religious Services: Patient declined    Database Administrator or Organizations: Patient declined    Attends Banker Meetings: Patient declined    Marital Status: Never married   Social History   Tobacco  Use  Smoking Status Never  Smokeless Tobacco Never  Tobacco Comments   Patient denied current use of tobacco   Social History   Substance and Sexual Activity  Alcohol Use Never   Social History   Substance and Sexual Activity  Drug Use Yes    Additional pertinent information Patient lives with his mother, but gave no other information.  FAMILY HISTORY  No family history on file. Family Psychiatric History (if known):  Did not report  MENTAL STATUS EXAM (MSE)  Mental Status Exam: General Appearance: Fairly Groomed  Orientation:  Other:  Simply didn't answer orientation questions, but he interacted with the environment,  including social cues, with basic social appropriateness  Memory:  Pt would/could not give any history  Concentration:  Concentration: Poor and Attention Span: Poor  Recall:  Pt would/could not give any history  Attention  Poor  Eye Contact:  Minimal  Speech:  Slow  Language:  Good  Volume:  Normal  Mood: Pt would/could not give any history  Affect:  Blunt and Flat  Thought Process:  Disorganized  Thought Content:  Rumination and Tangential  Suicidal Thoughts:  Pt would/could not give any history  Homicidal Thoughts:  Pt would/could not give any history  Judgement:  Impaired  Insight:  Lacking  Psychomotor Activity:  Decreased  Akathisia:  Negative  Fund of Knowledge:  Pt would/could not give any history    Assets:  Housing Social Support  Cognition:  Pt would/could not give any history  ADL's:  Intact  AIMS (if indicated):       VITALS  Blood pressure 113/75, pulse 74, temperature 98.2 F (36.8 C), resp. rate 18, SpO2 98%.  LABS  Admission on 08/13/2024  Component Date Value Ref Range Status   Sodium 08/13/2024 145  135 - 145 mmol/L Final   Potassium 08/13/2024 4.4  3.5 - 5.1 mmol/L Final   Chloride 08/13/2024 106  98 - 111 mmol/L Final   CO2 08/13/2024 25  22 - 32 mmol/L Final   Glucose, Bld 08/13/2024 88  70 - 99 mg/dL Final   Glucose reference range applies only to samples taken after fasting for at least 8 hours.   BUN 08/13/2024 11  6 - 20 mg/dL Final   Creatinine, Ser 08/13/2024 1.11  0.61 - 1.24 mg/dL Final   Calcium 88/76/7974 9.9  8.9 - 10.3 mg/dL Final   Total Protein 88/76/7974 7.8  6.5 - 8.1 g/dL Final   Albumin 88/76/7974 5.0  3.5 - 5.0 g/dL Final   AST 88/76/7974 23  15 - 41 U/L Final   ALT 08/13/2024 28  0 - 44 U/L Final   Alkaline Phosphatase 08/13/2024 91  38 - 126 U/L Final   Total Bilirubin 08/13/2024 0.7  0.0 - 1.2 mg/dL Final   GFR, Estimated 08/13/2024 >60  >60 mL/min Final   Comment: (NOTE) Calculated using the CKD-EPI Creatinine Equation  (2021)    Anion gap 08/13/2024 14  5 - 15 Final   Performed at [facility], [address] [place], [place] [address]   Alcohol, Ethyl (B) 08/13/2024 <15  <15 mg/dL Final   Comment: (NOTE) For medical purposes only. Performed at [facility], [address] [place], [place] [address]    WBC 08/13/2024 15.8 (H)  4.0 - 10.5 K/uL Final   RBC 08/13/2024 4.80  4.22 - 5.81 MIL/uL Final   Hemoglobin 08/13/2024 14.5  13.0 - 17.0 g/dL Final   HCT 88/76/7974 41.4  39.0 -  52.0 % Final   MCV 08/13/2024 86.3  80.0 - 100.0 fL Final   MCH 08/13/2024 30.2  26.0 - 34.0 pg Final   MCHC 08/13/2024 35.0  30.0 - 36.0 g/dL Final   RDW 88/76/7974 12.3  11.5 - 15.5 % Final   Platelets 08/13/2024 336  150 - 400 K/uL Final   nRBC 08/13/2024 0.0  0.0 - 0.2 % Final   Performed at [facility], [address] [place], [place] [address]   Opiates 08/13/2024 NEGATIVE  NEGATIVE Final   Cocaine 08/13/2024 NEGATIVE  NEGATIVE Final   Benzodiazepines 08/13/2024 NEGATIVE  NEGATIVE Final   Amphetamines 08/13/2024 NEGATIVE  NEGATIVE Final   Tetrahydrocannabinol 08/13/2024 NEGATIVE  NEGATIVE Final   Barbiturates 08/13/2024 NEGATIVE  NEGATIVE Final   Methadone Scn, Ur 08/13/2024 NEGATIVE  NEGATIVE Final   Fentanyl  08/13/2024 NEGATIVE  NEGATIVE Final   Comment: (NOTE) Drug screen is for Medical Purposes only. Positive results are preliminary only. If confirmation is needed, notify lab within 5 days.  Drug Class                 Cutoff (ng/mL) Amphetamine and metabolites 1000 Barbiturate and metabolites 200 Benzodiazepine              200 Opiates and metabolites     300 Cocaine and metabolites     300 THC                         50 Fentanyl                     5 Methadone                   300  Trazodone  is metabolized in vivo to several metabolites,  including pharmacologically active m-CPP, which is excreted in the  urine.  Immunoassay screens for amphetamines and MDMA have  potential  cross-reactivity with these compounds and may provide false positive  result.  Performed at [facility], [address] [place], [place] [address]     PSYCHIATRIC REVIEW OF SYSTEMS (ROS)  ROS: Notable for the following relevant positive findings: Review of Systems  Constitutional: Negative.   HENT: Negative.    Eyes: Negative.   Respiratory: Negative.    Cardiovascular: Negative.   Gastrointestinal: Negative.   Genitourinary: Negative.   Musculoskeletal: Negative.   Skin: Negative.   Neurological: Negative.   Endo/Heme/Allergies: Negative.   Psychiatric/Behavioral:  Positive for hallucinations.     Additional findings:      Musculoskeletal: No abnormal movements observed      Gait & Station: Normal      Pain Screening: Denies      Nutrition & Dental Concerns:  Reviewed   RISK FORMULATION/ASSESSMENT  Is the patient experiencing any suicidal or homicidal ideations: Unclear       Explain if yes:   Patient simply states I don't know when asked any question, so unclear if has had thoughts of death or harm to others.  Has not made any such threats spontaneously.  Protective factors considered for safety management:   Family has been unable to provide consistent safety for patient, given the behaviors that he's exhibited.  Risk factors/concerns considered for safety management:   Impulsivity Isolation Unwillingness to seek help Male gender Unmarried  Is there a safety management plan with the patient and treatment team to minimize risk factors and promote protective factors: No  Explain: As above, patient not cooperative with treatment or with social support  Is crisis care placement or psychiatric hospitalization recommended: Yes     Based on my current evaluation and risk assessment, patient is determined at this time to be at:  High risk  *RISK ASSESSMENT Risk assessment is a dynamic process; it is possible that this patient's  condition, and risk level, may change. This should be re-evaluated and managed over time as appropriate. Please re-consult psychiatric consult services if additional assistance is needed in terms of risk assessment and management. If your team decides to discharge this patient, please advise the patient how to best access emergency psychiatric services, or to call 911, if their condition worsens or they feel unsafe in any way.   Adriana JINNY Pontes, MD Telepsychiatry Consult Services

## 2024-08-13 NOTE — ED Provider Notes (Addendum)
 [facility] Provider Note    Event Date/Time   First MD Initiated Contact with Patient 08/13/24 1805     (approximate)   History   Psychiatric Evaluation   HPI  Eric Pineda is a 28 y.o. male who presents to the ED for evaluation of Psychiatric Evaluation   Reviewed psychiatric DC summary from May.  History of schizophrenia  Patient presents to the ED today under IVC with law enforcement for evaluation of bizarre behavior, not taking his medications at home.  I review paperwork filled out by his mother.  Notably not taking his medications after being released from a different hospital 2 weeks ago, found outside sitting [place] the side of a highway for hours, wandering.  Here in the ED patient reports he is fine and has no complaints.  Reports he is here to get a checkup.  Reports he does not like taking his medications   Physical Exam   Triage Vital Signs: ED Triage Vitals [08/13/24 1758]  Encounter Vitals Group     BP 132/88     Girls Systolic BP Percentile      Girls Diastolic BP Percentile      Boys Systolic BP Percentile      Boys Diastolic BP Percentile      Pulse Rate (!) 108     Resp 14     Temp 98.1 F (36.7 C)     Temp Source Oral     SpO2 99 %     Weight      Height      Head Circumference      Peak Flow      Pain Score 0     Pain Loc      Pain Education      Exclude from Growth Chart     Most recent vital signs: Vitals:   08/13/24 1758 08/13/24 1927  BP: 132/88 113/75  Pulse: (!) 108 74  Resp: 14 18  Temp: 98.1 F (36.7 C) 98.2 F (36.8 C)  SpO2: 99% 98%    General: Awake, no distress.  Flat affect, staring CV:  Good peripheral perfusion.  Resp:  Normal effort.  Abd:  No distention.  MSK:  No deformity noted.  Neuro:  No focal deficits appreciated. Other:     ED Results / Procedures / Treatments   Labs (all labs ordered are listed, but only abnormal results are displayed) Labs Reviewed  CBC - Abnormal;  Notable for the following components:      Result Value   WBC 15.8 (*)    All other components within normal limits  COMPREHENSIVE METABOLIC PANEL WITH GFR  ETHANOL  URINE DRUG SCREEN    EKG   RADIOLOGY   Official radiology report(s): No results found.  PROCEDURES and INTERVENTIONS:  Procedures  Medications - No data to display   IMPRESSION / MDM / ASSESSMENT AND PLAN / ED COURSE  I reviewed the triage vital signs and the nursing notes.  Differential diagnosis includes, but is not limited to, medication noncompliance, metabolic encephalopathy, polysubstance abuse, malingering, acute psychoses, withdrawals  {Patient presents with symptoms of an acute illness or injury that is potentially life-threatening.  Patient presents to the ED under IVC for medication noncompliance and bizarre behavior.  No evidence of trauma or acute medical pathology.  presenting tachycardia is noted and normalizes without intervention once he is back to her room.  Flat affect but otherwise normal exam without signs of particular toxidrome, trauma or  self-harm.  Leukocytosis is noted but no clear infectious etiology of his symptoms.  Normal metabolic panel, UDS, negative ethanol level.  Will uphold IVC and consult psychiatry  Clinical Course as of 08/13/24 2202  [name] Aug 13, 2024  2201 The patient has been placed in psychiatric observation due to the need to provide a safe environment for the patient while obtaining psychiatric consultation and evaluation, as well as ongoing medical and medication management to treat the patient's condition.  The patient has been placed under full IVC at this time.   [DS]    Clinical Course User Index [DS] Claudene Rover, MD     FINAL CLINICAL IMPRESSION(S) / ED DIAGNOSES   Final diagnoses:  Bizarre behavior     Rx / DC Orders   ED Discharge Orders     None        Note:  This document was prepared using Dragon voice recognition software and may  include unintentional dictation errors.   Claudene Rover, MD 08/13/24 DESMA    Claudene Rover, MD 08/13/24 [phone]

## 2024-08-13 NOTE — BH Assessment (Addendum)
 Comprehensive Clinical Assessment (CCA) Screening, Triage and Referral Note  08/13/2024 Eric Pineda [id number] Recommendations for Services/Supports/Treatments: Consulted with MD Laurence Ram Heh., who recommended pt. for inpatient treatment.   Eric Pineda, English speaking, Hispanic male. Pt presented to [facility] ED under IVC. Per triage note: Pt brought in by [facility] under IVC for mental breakdown. Pt refusing to cooperate.  On assessment, the patient was resistant and guarded. Pt had a passive attitude and was unwilling to expand on his presenting issues. Pt remained reticent throughout the assessment. Pt denied having any mood disturbance or stressors, refusing to expand further. Pt had paucity of speech. Pt presented with a blunted affect. When asked why he doesn't take his psych medications, pt stated, Just because. Pt denied substance abuse. Pt denied having relational problems or issues with his functioning. Pt denied having sleep or appetite disturbance. Pt denied SI/HI/AV/H. Chief Complaint:  Chief Complaint  Patient presents with   Psychiatric Evaluation   Visit Diagnosis: Schizophrenia [facility])   Patient Reported Information How did you hear about us ? Self  What Is the Reason for Your Visit/Call Today? Patient brought to the ER due to concerns about his mental health and behaviors.  How Long Has This Been Causing You Problems? 1 wk - 1 month  What Do You Feel Would Help You the Most Today? Treatment for Depression or other mood problem; Alcohol or Drug Use Treatment   Have You Recently Had Any Thoughts About Hurting Yourself? No  Are You Planning to Commit Suicide/Harm Yourself At This time? No   Have you Recently Had Thoughts About Hurting Someone Eric Pineda? No  Are You Planning to Harm Someone at This Time? No  Explanation: No data recorded  Have You Used Any Alcohol or Drugs in the Past 24 Hours? No  How Long Ago Did You Use Drugs or Alcohol? No data recorded What Did You  Use and How Much? No data recorded  Do You Currently Have a Therapist/Psychiatrist? No  Name of Therapist/Psychiatrist: No data recorded  Have You Been Recently Discharged From Any Office Practice or Programs? No  Explanation of Discharge From Practice/Program: No data recorded   CCA Screening Triage Referral Assessment Type of Contact: Face-to-Face  Telemedicine Service Delivery:   Is this Initial or Reassessment?   Date Telepsych consult ordered in [facility]:    Time Telepsych consult ordered in [facility]:    Location of Assessment: [facility]  Provider Location: [facility] ED    Collateral Involvement: No data recorded  Does Patient Have a Court Appointed Legal Guardian? No data recorded Name and Contact of Legal Guardian: No data recorded If Minor and Not Living with Parent(s), Who has Custody? No data recorded Is CPS involved or ever been involved? Never  Is APS involved or ever been involved? Never   Patient Determined To Be At Risk for Harm To Self or Others Based on Review of Patient Reported Information or Presenting Complaint? Yes, for Harm to Others  Method: No data recorded Availability of Means: No data recorded Intent: No data recorded Notification Required: No data recorded Additional Information for Danger to Others Potential: No data recorded Additional Comments for Danger to Others Potential: No data recorded Are There Guns or Other Weapons in Your Home? No data recorded Types of Guns/Weapons: No data recorded Are These Weapons Safely Secured?                            No  Who Could Verify You Are Able To Have These Secured: No data recorded Do You Have any Outstanding Charges, Pending Court Dates, Parole/Probation? No data recorded Contacted To Inform of Risk of Harm To Self or Others: No data recorded  Does Patient Present under Involuntary Commitment? Yes    [place] of Residence: [place]   Patient Currently Receiving the Following Services: Not Receiving  Services   Determination of Need: Emergent (2 hours)   Options For Referral: ED Referral   Disposition Recommendation per psychiatric provider: Pending Iris consult.   Eric Pineda R Lee-Ann Gal, LCAS

## 2024-08-14 ENCOUNTER — Other Ambulatory Visit: Payer: Self-pay

## 2024-08-14 ENCOUNTER — Encounter: Payer: Self-pay | Admitting: Psychiatry

## 2024-08-14 ENCOUNTER — Inpatient Hospital Stay
Admission: AD | Admit: 2024-08-14 | Discharge: 2024-09-04 | DRG: 885 | Disposition: A | Payer: MEDICAID | Source: Intra-hospital | Attending: Psychiatry | Admitting: Psychiatry

## 2024-08-14 DIAGNOSIS — F203 Undifferentiated schizophrenia: Principal | ICD-10-CM | POA: Diagnosis present

## 2024-08-14 MED ORDER — LORAZEPAM 2 MG/ML IJ SOLN: 2.0000 mg | Freq: Three times a day (TID) | INTRAMUSCULAR | Status: DC | PRN

## 2024-08-14 MED ORDER — DIPHENHYDRAMINE HCL 50 MG/ML IJ SOLN: 50.0000 mg | Freq: Three times a day (TID) | INTRAMUSCULAR | Status: DC | PRN

## 2024-08-14 MED ORDER — TRAZODONE HCL 50 MG PO TABS
50.0000 mg | ORAL_TABLET | Freq: Every evening | ORAL | Status: DC | PRN
  Administered 2024-08-21 – 2024-08-28 (×2): 50 mg via ORAL
  Filled 2024-08-14 (×2): qty 1

## 2024-08-14 MED ORDER — ALUM & MAG HYDROXIDE-SIMETH 200-200-20 MG/5ML PO SUSP: 30.0000 mL | ORAL | Status: DC | PRN

## 2024-08-14 MED ORDER — HALOPERIDOL 5 MG PO TABS: 5.0000 mg | ORAL_TABLET | Freq: Three times a day (TID) | ORAL | Status: DC | PRN

## 2024-08-14 MED ORDER — HALOPERIDOL LACTATE 5 MG/ML IJ SOLN: 5.0000 mg | Freq: Three times a day (TID) | INTRAMUSCULAR | Status: DC | PRN

## 2024-08-14 MED ORDER — DIPHENHYDRAMINE HCL 25 MG PO CAPS: 50.0000 mg | ORAL_CAPSULE | Freq: Three times a day (TID) | ORAL | Status: DC | PRN

## 2024-08-14 MED ORDER — ACETAMINOPHEN 325 MG PO TABS: 650.0000 mg | ORAL_TABLET | Freq: Four times a day (QID) | ORAL | Status: DC | PRN

## 2024-08-14 MED ORDER — MAGNESIUM HYDROXIDE 400 MG/5ML PO SUSP: 30.0000 mL | Freq: Every day | ORAL | Status: DC | PRN

## 2024-08-14 MED ORDER — HYDROXYZINE HCL 25 MG PO TABS
25.0000 mg | ORAL_TABLET | Freq: Three times a day (TID) | ORAL | Status: DC | PRN
  Filled 2024-08-14: qty 1

## 2024-08-14 MED ORDER — HALOPERIDOL LACTATE 5 MG/ML IJ SOLN: 10.0000 mg | Freq: Three times a day (TID) | INTRAMUSCULAR | Status: DC | PRN

## 2024-08-14 NOTE — ED Notes (Signed)
 IVC/Pending Eval

## 2024-08-14 NOTE — Group Note (Signed)
 Recreation Therapy Group Note   Group Topic:General Recreation  Group Date: 08/14/2024 Start Time: 1400 End Time: 1455 Facilitators: Celestia Jeoffrey BRAVO, LRT, CTRS Location: Courtyard  Group Description: [employer]. LRT and patients played games of basketball, drew with chalk, and played corn hole while outside in the courtyard while getting fresh air and sunlight. Music was being played in the background. LRT and peers conversed about different games they have played before, what they do in their free time and anything else that is on their minds. LRT encouraged pts to drink water after being outside, sweating and getting their heart rate up.  Goal Area(s) Addressed: Patient will build on frustration tolerance skills. Patients will partake in a competitive play game with peers. Patients will gain knowledge of new leisure interest/hobby.    Affect/Mood: N/A   Participation Level: Did not attend    Clinical Observations/Individualized Feedback: Patient did not attend group.   Plan: Continue to engage patient in RT group sessions 2-3x/week.   Jeoffrey BRAVO Celestia, LRT, CTRS 08/14/2024 4:59 PM

## 2024-08-14 NOTE — Group Note (Signed)
 Date:  08/14/2024 Time:  6:05 PM  Group Topic/Focus:  Goals Group:   The focus of this group is to help patients establish daily goals to achieve during treatment and discuss how the patient can incorporate goal setting into their daily lives to aide in recovery.    Participation Level:  Did Not Attend   Eric Pineda 08/14/2024, 6:05 PM

## 2024-08-14 NOTE — Plan of Care (Signed)
   Problem: Coping: Goal: Ability to verbalize frustrations and anger appropriately will improve Outcome: Progressing

## 2024-08-14 NOTE — Progress Notes (Signed)
 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 Patient states the reason for admission  I am here for a regular check up. Patient answer questions in one or two words. Patient denies SI,HI and AVH. Skin assessment and body search done. No contraband found.Oriented to unit and made patient comfortable. Support and encouragement given.

## 2024-08-14 NOTE — ED Notes (Addendum)
 Pt asleep, resting comfortably, rise and fall of chest noted

## 2024-08-14 NOTE — ED Notes (Signed)
 Patient is resting comfortably.

## 2024-08-14 NOTE — Tx Team (Signed)
 Initial Treatment Plan 08/14/2024 12:47 PM [name] [id number]    PATIENT STRESSORS: Health problems   Medication change or noncompliance     PATIENT STRENGTHS: Motivation for treatment/growth  Physical Health  Supportive family/friends    PATIENT IDENTIFIED PROBLEMS: Noncompliant with medications  Poor insight                   DISCHARGE CRITERIA:  Ability to meet basic life and health needs Adequate post-discharge living arrangements Medical problems require only outpatient monitoring  PRELIMINARY DISCHARGE PLAN: Attend aftercare/continuing care group Return to previous living arrangement  PATIENT/FAMILY INVOLVEMENT: This treatment plan has been presented to and reviewed with the patient, Eric Pineda, and/or family member,.  The patient and family have been given the opportunity to ask questions and make suggestions.  Dollie Zachary Sandifer, RN 08/14/2024, 12:47 PM

## 2024-08-14 NOTE — ED Notes (Signed)
 Meal provided

## 2024-08-14 NOTE — ED Notes (Signed)
 RN to RN report given to [employer]. Pt aware of admission. Pt taken to room with tech and security with all belongings. Pt calm and cooperative.

## 2024-08-14 NOTE — Group Note (Signed)
 LCSW Group Therapy Note  Group Date: 08/14/2024 Start Time: 1300 End Time: 1400   Type of Therapy and Topic:  Group Therapy: Anger Cues and Responses  Participation Level:  Did Not Attend   Description of Group:   In this group, patients learned how to recognize the physical, cognitive, emotional, and behavioral responses they have to anger-provoking situations.  They identified a recent time they became angry and how they reacted.  They analyzed how their reaction was possibly beneficial and how it was possibly unhelpful.  The group discussed a variety of healthier coping skills that could help with such a situation in the future.  Focus was placed on how helpful it is to recognize the underlying emotions to our anger, because working on those can lead to a more permanent solution as well as our ability to focus on the important rather than the urgent.  Therapeutic Goals: Patients will remember their last incident of anger and how they felt emotionally and physically, what their thoughts were at the time, and how they behaved. Patients will identify how their behavior at that time worked for them, as well as how it worked against them. Patients will explore possible new behaviors to use in future anger situations. Patients will learn that anger itself is normal and cannot be eliminated, and that healthier reactions can assist with resolving conflict rather than worsening situations.  Summary of Patient Progress:   X  Therapeutic Modalities:   Cognitive Behavioral Therapy    Sherryle JINNY Margo, LCSW 08/14/2024  3:19 PM

## 2024-08-14 NOTE — Group Note (Deleted)
 Date:  08/14/2024 Time:  5:51 PM  Group Topic/Focus:  Building Self Esteem:   The Focus of this group is helping patients become aware of the effects of self-esteem on their lives, the things they and others do that enhance or undermine their self-esteem, seeing the relationship between their level of self-esteem and the choices they make and learning ways to enhance self-esteem.     Participation Level:  {BHH PARTICIPATION OZCZO:77735}  Participation Quality:  {BHH PARTICIPATION QUALITY:22265}  Affect:  {BHH AFFECT:22266}  Cognitive:  {BHH COGNITIVE:22267}  Insight: {BHH Insight2:20797}  Engagement in Group:  {BHH ENGAGEMENT IN HMNLE:77731}  Modes of Intervention:  {BHH MODES OF INTERVENTION:22269}  Additional Comments:  ***  [name] [name] Michi Herrmann 08/14/2024, 5:51 PM

## 2024-08-14 NOTE — ED Notes (Signed)
 Patient sleeping

## 2024-08-14 NOTE — ED Provider Notes (Signed)
 Emergency Medicine Observation Re-evaluation Note  Eric Pineda is a 28 y.o. male, awaiting psych dispo  Physical Exam  BP 116/76 (BP Location: Right Arm)   Pulse 80   Temp 98.2 F (36.8 C) (Oral)   Resp 18   SpO2 97%  Physical Exam General: calm   ED Course / MDM  Labs reviewed. Slight leukocytosis. No fever.  Plan  Current plan is for psych dispo.    Floy Roberts, MD 08/14/24 [phone]

## 2024-08-15 NOTE — Plan of Care (Signed)
   Problem: Education: Goal: Emotional status will improve Outcome: Progressing Goal: Mental status will improve Outcome: Progressing Goal: Verbalization of understanding the information provided will improve Outcome: Progressing

## 2024-08-15 NOTE — Group Note (Signed)
 Date:  08/15/2024 Time:  1:59 AM  Group Topic/Focus:  Emotional Education:   The focus of this group is to discuss what feelings/emotions are, and how they are experienced. Wrap-Up Group:   The focus of this group is to help patients review their daily goal of treatment and discuss progress on daily workbooks.    Participation Level:  Did Not Attend    Eric Pineda 08/15/2024, 1:59 AM

## 2024-08-15 NOTE — Progress Notes (Signed)
   08/15/24 2200  Psych Admission Type (Psych Patients Only)  Admission Status Involuntary  Psychosocial Assessment  Patient Complaints None  Eye Contact Brief  Facial Expression Blank  Affect Preoccupied;Constricted  Speech Logical/coherent  Interaction Cautious;Minimal;Seclusive;Guarded  Motor Activity Other (Comment) (WDL)  Appearance/Hygiene Unremarkable  Behavior Characteristics Guarded  Mood Preoccupied  Aggressive Behavior  Effect No apparent injury  Thought Process  Coherency Blocking  Content Preoccupation  Delusions None reported or observed  Perception WDL  Hallucination None reported or observed  Judgment Poor  Confusion None  Danger to Self  Current suicidal ideation? Denies  Agreement Not to Harm Self Yes  Description of Agreement verbal  Danger to Others  Danger to Others None reported or observed   Pt lying in be, gives one word, brief response.  Presents guarded, preoccupied in thoughts. Denies SI/HI, depression and anxiety.

## 2024-08-15 NOTE — H&P (Signed)
 Psychiatric Admission Assessment Adult  [name] MRN:  [id number] Date of Evaluation:  08/15/2024 Chief Complaint:  Undifferentiated schizophrenia [facility]) [F20.3]   History of Present Illness:  Per psychiatric consult note 08/13/24: The patient presents on an IVC after having been picked up by police along the side of [address], where he had apparently been sitting for a very long period of time.  By history, has long suffered from schizophrenia, along with a long history of lack of participation in any type of treatment.   Patient himself essentially only repeated I don't know to any questions asked.  He did admit that he's not been taking medications.  He said that he sometimes drinks ETOH, but denied other drugs.  Both BAL and UDS negative.  He could not give any answer--or it appeared, any thought--to the issue of the dangerousness of his actions, putting himself at the side of the road, without clear thoughts about what to do next.  Patient appeared that he could be responding to internal stimuli.   Per IVC and Epic, patient has been out of the hospital for about 2 weeks.  He has taken no medications since coming up.  Over the 2 weeks, his behavior has been becoming increasingly bizarre, with extensive pacing around the house and persistent laughter when trying to fall asleep.  Today he left his home without notice, and it was only a few hours later that the police were notified and brought patient to the ED.  He has consistently been (essentialy) non-communicative since coming to ED, but did not appear catatonic.  Patient is poor historian. On interview today, patient is noted to be oriented to person but is unable to give date, day of week, month, year, location, or present situation.  When provider asks about patient's understanding of being in the hospital currently, patient replies he is here for a regular checkup.  Unable to discuss the events leading to  his hospitalization.  He denies physical complaints at this time. Patient denies current symptoms of depression or anxiety. He denies history of trauma. He denies manic symptoms and there is no evidence of mania at time of interview.  He denies SI/HI/plan and denies hallucinations.  He reports good sleep and appetite.  He reports taking medications at home but is unable to list which ones and states they are not effective.  Patient reports living with his mom and 2 sisters.  He is able to state the city in which he lives.  Patient lacks insight and does not report any mental health complaints.  He is not observed responding to internal stimuli at time of interview.  He gives inappropriate responses, often answering questions with a yes or no.  He is unable to discuss his hobbies or favorite foods.  He is unable to engage further in meaningful interview.  He is observed pacing the unit halls throughout the day.  He has maintained safe behaviors on the unit thus far.   Total Time spent with patient: 1 hour Sleep  Sleep:Sleep: Good  Past Psychiatric History: Schizophrenia, depression Psychiatric History:  Information collected from the patient and chart review.   Prev Dx/Sx: Schizophrenia, depression  Current Psych Provider: Denies  Home Meds (current): Unknown  Previous Med Trials: Invega , Invega  Sustenna Therapy: Denies   Prior Psych Hospitalization: Yes, multiple, most recent two weeks ago per chart review  Prior Self Harm: Denies Prior Violence: Denies  Family Psych History: Unknown  Family Hx suicide: Unknown  Social History:  Developmental Hx: Unknown  Educational Hx: 11th grade  Occupational Hx: unemployed, previously worked in [employer] Hx: denies Living Situation: Lives with mom and two sisters  Spiritual Hx: Unknown  Access to weapons/lethal means: Denies    Substance History Alcohol: denies   Tobacco: denies Illicit drugs: denies Prescription drug abuse: Unknown   Rehab hx: Unknown  Is the patient at risk to self? Yes.    Has the patient been a risk to self in the past 6 months? Unknown  Has the patient been a risk to self within the distant past? Unknown Is the patient a risk to others? Unknown  Has the patient been a risk to others in the past 6 months? Unknown Has the patient been a risk to others within the distant past? Unknown  Columbia Scale:  Flowsheet Row Admission (Current) from 08/14/2024 in [facility] INPATIENT BEHAVIORAL MEDICINE ED from 08/13/2024 in [facility] Emergency Department at [facility] Admission (Discharged) from 01/17/2024 in [facility] INPATIENT BEHAVIORAL MEDICINE  C-SSRS RISK CATEGORY No Risk No Risk No Risk     Past Medical History:  Past Medical History:  Diagnosis Date   Bizarre behavior 12/02/2019   History reviewed. No pertinent surgical history. Family History: History reviewed. No pertinent family history.  Social History:  Social History   Substance and Sexual Activity  Alcohol Use Never     Social History   Substance and Sexual Activity  Drug Use Yes      Allergies:  No Known Allergies Lab Results: No results found for this or any previous visit (from the past 48 hours).  Blood Alcohol level:  Lab Results  Component Value Date   [facility] <15 08/13/2024   ETH <15 01/14/2024    Metabolic Disorder Labs:  Lab Results  Component Value Date   HGBA1C 5.0 01/18/2023   MPG 96.8 01/18/2023   MPG 99.67 12/03/2019   No results found for: PROLACTIN Lab Results  Component Value Date   CHOL 159 01/18/2023   TRIG 103 01/18/2023   HDL 57 01/18/2023   CHOLHDL 2.8 01/18/2023   VLDL 21 01/18/2023   LDLCALC 81 01/18/2023   LDLCALC 104 (H) 10/04/2020    Current Medications: Current Facility-Administered Medications  Medication Dose Route Frequency Provider Last Rate Last Admin   acetaminophen  (TYLENOL ) tablet 650 mg  650 mg Oral Q6H PRN Bobbitt, Shalon E, NP       alum & mag hydroxide-simeth  (MAALOX/MYLANTA) 200-200-20 MG/5ML suspension 30 mL  30 mL Oral Q4H PRN Bobbitt, Shalon E, NP       haloperidol  (HALDOL ) tablet 5 mg  5 mg Oral TID PRN Bobbitt, Shalon E, NP       And   diphenhydrAMINE  (BENADRYL ) capsule 50 mg  50 mg Oral TID PRN Bobbitt, Shalon E, NP       haloperidol  lactate (HALDOL ) injection 5 mg  5 mg Intramuscular TID PRN Bobbitt, Shalon E, NP       And   diphenhydrAMINE  (BENADRYL ) injection 50 mg  50 mg Intramuscular TID PRN Bobbitt, Shalon E, NP       And   LORazepam  (ATIVAN ) injection 2 mg  2 mg Intramuscular TID PRN Bobbitt, Shalon E, NP       haloperidol  lactate (HALDOL ) injection 10 mg  10 mg Intramuscular TID PRN Bobbitt, Shalon E, NP       And   diphenhydrAMINE  (BENADRYL ) injection 50 mg  50 mg Intramuscular TID PRN Bobbitt, Shalon E, NP  And   LORazepam  (ATIVAN ) injection 2 mg  2 mg Intramuscular TID PRN Bobbitt, Shalon E, NP       hydrOXYzine  (ATARAX ) tablet 25 mg  25 mg Oral TID PRN Bobbitt, Shalon E, NP       magnesium  hydroxide (MILK OF MAGNESIA) suspension 30 mL  30 mL Oral Daily PRN Bobbitt, Shalon E, NP       traZODone  (DESYREL ) tablet 50 mg  50 mg Oral QHS PRN Bobbitt, Shalon E, NP       PTA Medications: Medications Prior to Admission  Medication Sig Dispense Refill Last Dose/Taking   benztropine  (COGENTIN ) 0.5 MG tablet Take 1 tablet (0.5 mg total) by mouth daily. (Patient not taking: Reported on 08/14/2024) 30 tablet 0    paliperidone  (INVEGA  SUSTENNA) 234 MG/1.5ML injection Inject 234 mg into the muscle every 28 (twenty-eight) days. Next dose due q28 days, 02/24/2024 (Patient not taking: Reported on 08/14/2024) 1.5 mL 0     Psychiatric Specialty Exam:  Presentation  General Appearance:  Appropriate for Environment  Eye Contact: Fleeting  Speech: Clear and Coherent  Speech Volume: Normal    Mood and Affect  Mood: Euthymic  Affect: Constricted   Thought Process  Thought Processes: Other (comment) (Thought  blocking)  Descriptions of Associations:Intact  Orientation:Partial (Oriented to person; unable to give date, location, or situation)  Thought Content:Scattered  Hallucinations:Hallucinations: None  Ideas of Reference:None  Suicidal Thoughts:Suicidal Thoughts: No  Homicidal Thoughts:Homicidal Thoughts: No   Sensorium  Memory: Immediate Fair  Judgment: Impaired  Insight: Lacking   Executive Functions  Concentration: Fair  Attention Span: Fair  Recall: Fair  Fund of Knowledge: Poor  Language: Poor   Psychomotor Activity  Psychomotor Activity: Psychomotor Activity: Normal   Assets  Assets: Social Support    Musculoskeletal: Strength & Muscle Tone: within normal limits Gait & Station: normal  Physical Exam: Physical Exam Vitals and nursing note reviewed.  Constitutional:      Appearance: Normal appearance.  HENT:     Head: Atraumatic.     Nose: Nose normal.     Mouth/Throat:     Mouth: Mucous membranes are moist.  Eyes:     Conjunctiva/sclera: Conjunctivae normal.  Pulmonary:     Effort: Pulmonary effort is normal.  Neurological:     Mental Status: He is alert.  Psychiatric:        Attention and Perception: Attention normal. He does not perceive auditory or visual hallucinations.        Mood and Affect: Mood normal. Affect is flat.        Speech: Speech is delayed.        Behavior: Behavior is cooperative.        Thought Content: Thought content does not include homicidal or suicidal ideation. Thought content does not include homicidal or suicidal plan.        Cognition and Memory: Cognition is impaired.        Judgment: Judgment is inappropriate.    Review of Systems  Psychiatric/Behavioral:  Negative for depression, hallucinations, substance abuse and suicidal ideas. The patient is not nervous/anxious and does not have insomnia.    Blood pressure 102/68, pulse 71, temperature (!) 97.5 F (36.4 C), temperature source Oral, resp.  rate 18, height 5' 10 (1.778 m), weight 75.8 kg, SpO2 98%. Body mass index is 23.96 kg/m.  Principal Diagnosis: Undifferentiated schizophrenia (HCC) Diagnosis:  Principal Problem:   Undifferentiated schizophrenia [facility])   Clinical Decision Making: Patient requires inpatient hospitalization for stabilization and  safety monitoring due to current presentation of psychosis with reported medication noncompliance.   Treatment Plan Summary:  Safety and Monitoring:             -- Voluntary admission to inpatient psychiatric unit for safety, stabilization and treatment             -- Daily contact with patient to assess and evaluate symptoms and progress in treatment             -- Patient's case to be discussed in multi-disciplinary team meeting             -- Observation Level: q15 minute checks             -- Vital signs:  q12 hours             -- Precautions: suicide, elopement, and assault   2. Psychiatric Diagnoses and Treatment:  Given that it is unclear which medications patient had been taking during his recent psychiatric hospitalization a few weeks ago, or whether he has recently received a long-acting preparation, will defer starting scheduled psychotropic medication until able to obtain collateral; patient also needs updated EKG as most recent is from 02/01/24, there is already an active order for EKG            -- The risks/benefits/side-effects/alternatives to this medication were discussed in detail with the patient and time was given for questions. The patient consents to medication trial.                -- Metabolic profile and EKG monitoring obtained while on an atypical antipsychotic (BMI: Lipid Panel: HbgA1c: QTc:)              -- Encouraged patient to participate in unit milieu and in scheduled group therapies                            3. Medical Issues Being Addressed:   No acute concerns    4. Discharge Planning:              -- Social work and case management to  assist with discharge planning and identification of hospital follow-up needs prior to discharge             -- Estimated LOS: 5-7 days             -- Discharge Concerns: Need to establish a safety plan; Medication compliance and effectiveness             -- Discharge Goals: Return home with outpatient referrals follow ups  Physician Treatment Plan for Primary Diagnosis: Undifferentiated schizophrenia (HCC) Long Term Goal(s): Improvement in symptoms so as ready for discharge  Short Term Goals: Ability to identify changes in lifestyle to reduce recurrence of condition will improve, Ability to verbalize feelings will improve, Ability to maintain clinical measurements within normal limits will improve, and Compliance with prescribed medications will improve  Physician Treatment Plan for Secondary Diagnosis: Principal Problem:   Undifferentiated schizophrenia (HCC)  Long Term Goal(s): Improvement in symptoms so as ready for discharge  Short Term Goals: Ability to identify changes in lifestyle to reduce recurrence of condition will improve, Ability to verbalize feelings will improve, Ability to maintain clinical measurements within normal limits will improve, and Compliance with prescribed medications will improve  I certify that inpatient services furnished can reasonably be expected to improve the patient's condition.    Camelia LITTIE Lukes, [place] 11/25/20258:43  PM

## 2024-08-15 NOTE — BHH Suicide Risk Assessment (Signed)
 [facility] Admission Suicide Risk Assessment   Nursing information obtained from:  Patient Demographic factors:  Male, Unemployed, Adolescent or young adult Current Mental Status:  NA Loss Factors:  NA Historical Factors:  NA Risk Reduction Factors:  Living with another person, especially a relative  Total Time spent with patient: 30 minutes Principal Problem: Undifferentiated schizophrenia (HCC) Diagnosis:  Principal Problem:   Undifferentiated schizophrenia (HCC)  Subjective Data: This is a 28 year old male with history of depression and schizophrenia who presented to the ED accompanied by [facility] PD under IVC.  Per ED triage note, patient's mother reported fearing for patient's safety, stating patient was discharged from another hospital 2 weeks ago and has been noncompliant with medications.  He [place] PD [place] highway. Patient is admitted to the adult inpatient unit with every 15-minute safety monitoring.  Multidisciplinary team approach is offered.  Medication management, group/milieu therapy is offered.  Continued Clinical Symptoms:  Alcohol Use Disorder Identification Test Final Score (AUDIT): 0 The Alcohol Use Disorders Identification Test, Guidelines for Use in Primary Care, Second Edition.  World Science Writer [facility]). Score between 0-7:  no or low risk or alcohol related problems. Score between 8-15:  moderate risk of alcohol related problems. Score between 16-19:  high risk of alcohol related problems. Score 20 or above:  warrants further diagnostic evaluation for alcohol dependence and treatment.   CLINICAL FACTORS:   Schizophrenia:   Paranoid or undifferentiated type   Musculoskeletal: Strength & Muscle Tone: within normal limits Gait & Station: normal Patient leans: N/A  Psychiatric Specialty Exam:  Presentation  General Appearance:  Appropriate for Environment  Eye Contact: Fleeting  Speech: Clear and Coherent  Speech  Volume: Normal  Handedness: Right   Mood and Affect  Mood: Euthymic  Affect: Constricted   Thought Process  Thought Processes: Other (comment) (Thought blocking)  Descriptions of Associations:Intact  Orientation:Partial (Oriented to person; unable to give date, location, or situation)  Thought Content:Scattered  History of Schizophrenia/Schizoaffective disorder:Yes  Duration of Psychotic Symptoms:No data recorded Hallucinations:Hallucinations: None  Ideas of Reference:None  Suicidal Thoughts:Suicidal Thoughts: No  Homicidal Thoughts:Homicidal Thoughts: No   Sensorium  Memory: Immediate Fair  Judgment: Impaired  Insight: Lacking   Executive Functions  Concentration: Fair  Attention Span: Fair  Recall: Fair  Fund of Knowledge: Poor  Language: Poor   Psychomotor Activity  Psychomotor Activity: Psychomotor Activity: Normal   Assets  Assets: Social Support   Sleep  Sleep: Sleep: Good    Physical Exam: Physical Exam ROS Blood pressure 102/68, pulse 71, temperature (!) 97.5 F (36.4 C), temperature source Oral, resp. rate 18, height 5' 10 (1.778 m), weight 75.8 kg, SpO2 98%. Body mass index is 23.96 kg/m.   COGNITIVE FEATURES THAT CONTRIBUTE TO RISK:  Loss of executive function    SUICIDE RISK:   Minimal: No identifiable suicidal ideation.  Patients presenting with no risk factors but with morbid ruminations; may be classified as minimal risk based on the severity of the depressive symptoms  PLAN OF CARE: Patient is admitted to the adult inpatient unit with every 15-minute safety monitoring.  Multidisciplinary team approach is offered.  Medication management, group/milieu therapy is offered.  I certify that inpatient services furnished can reasonably be expected to improve the patient's condition.   Camelia LITTIE Lukes, PA-C 08/15/2024, 8:22 PM

## 2024-08-15 NOTE — BHH Counselor (Signed)
 Adult Comprehensive Assessment  Patient ID: Eric Pineda, male   DOB: 08/20/1996, 28 y.o.   MRN: [id number]  Information Source: Information source: Patient  Current Stressors:  Patient states their primary concerns and needs for treatment are:: regular check up Patient states their goals for this hospitilization and ongoing recovery are:: none really go to the day room and chill Educational / Learning stressors: Pt denies. Employment / Job issues: Pt denies. Family Relationships: Pt denies. Financial / Lack of resources (include bankruptcy): Pt denies. Housing / Lack of housing: Pt denies. Physical health (include injuries & life threatening diseases): Pt denies. Social relationships: Pt denies. Substance abuse: Pt denies. Bereavement / Loss: Pt denies.  Living/Environment/Situation:  Living Arrangements: Other relatives, Parent Living conditions (as described by patient or guardian): WNL Who else lives in the home?: my mom, 2 sisters and niece How long has patient lived in current situation?: a while What is atmosphere in current home: Comfortable  Family History:  Marital status: Single Does patient have children?: No  Childhood History:  By whom was/is the patient raised?: Both parents Description of patient's relationship with caregiver when they were a child: good Patient's description of current relationship with people who raised him/her: good How were you disciplined when you got in trouble as a child/adolescent?: just none really Does patient have siblings?: Yes Number of Siblings: 2 Description of patient's current relationship with siblings: good Did patient suffer any verbal/emotional/physical/sexual abuse as a child?: No Did patient suffer from severe childhood neglect?: No Has patient ever been sexually abused/assaulted/raped as an adolescent or adult?: No Was the patient ever a victim of a crime or a disaster?: No Witnessed domestic violence?:  No Has patient been affected by domestic violence as an adult?: No  Education:  Highest grade of school patient has completed: that was middle school Currently a consulting civil engineer?: No Learning disability?: No  Employment/Work Situation:   Employment Situation: Unemployed What is the Longest Time Patient has Held a Job?: Pt denies. Where was the Patient Employed at that Time?: Pt denies. Has Patient ever Been in the [employer]?: No  Financial Resources:   Financial resources: Medicaid, Support from parents / caregiver Does patient have a lawyer or guardian?: No  Alcohol/Substance Abuse:   What has been your use of drugs/alcohol within the last 12 months?: Pt denies. If attempted suicide, did drugs/alcohol play a role in this?: No Alcohol/Substance Abuse Treatment Hx: Denies past history Has alcohol/substance abuse ever caused legal problems?: No  Social Support System:   Patient's Community Support System: None Describe Community Support System: Pt denies. Type of faith/religion: Pt denies. How does patient's faith help to cope with current illness?: Pt denies.  Leisure/Recreation:   Do You Have Hobbies?: No  Strengths/Needs:   What is the patient's perception of their strengths?: Pt denies. Patient states they can use these personal strengths during their treatment to contribute to their recovery: Pt denies. Patient states these barriers may affect/interfere with their treatment: Pt denies. Patient states these barriers may affect their return to the community: Pt denies. Other important information patient would like considered in planning for their treatment: Pt denies.  Discharge Plan:   Currently receiving community mental health services: No Patient states concerns and preferences for aftercare planning are: Pt denies. Patient states they will know when they are safe and ready for discharge when: I don't know Does patient have access to transportation?:  No Does patient have financial barriers related to discharge medications?: No Plan for  no access to transportation at discharge: CSW to will assist with transportation needs.  Summary/Recommendations:   Summary and Recommendations (to be completed by the evaluator): Patient is a 28 year old male [place] [facility]  Patient presents to the hospital for concerns of a "mental breakdown".  This clinical research associate notes that patient was guarded or experiencing thought blocking during the completion of this assessment stating "no" to most questions, whether that was an appropriate response or not.  Per chart review the patient was "refusing to cooperate" when in the ED and assessment was being attempted.  He reports that he does not have a current mental health provider, and declined a referral at this time.  It is unknown any triggers as patient declined all stressors.  This clinical research associate to follow up with collaterals for additional information.  Recommendations include: crisis stabilization, therapeutic milieu, encourage group attendance and participation, medication management for mood stabilization and development of a comprehensive mental wellness/sobriety plan.  Sherryle JINNY Margo. 08/15/2024

## 2024-08-15 NOTE — Plan of Care (Incomplete)
   Problem: Education: Goal: Emotional status will improve Outcome: Progressing   Problem: Education: Goal: Mental status will improve Outcome: Progressing

## 2024-08-15 NOTE — Group Note (Signed)
 Recreation Therapy Group Note   Group Topic:Coping Skills  Group Date: 08/15/2024 Start Time: 1000 End Time: 1050 Facilitators: Celestia Jeoffrey BRAVO, LRT, CTRS Location: Craft Room  Group Description: Mind Map.  Patient was provided a blank template of a diagram with 32 blank boxes in a tiered system, branching from the center (similar to a bubble chart). LRT directed patients to label the middle of the diagram Coping Skills. LRT and patients then came up with 8 different coping skills as examples. Pt were directed to record their coping skills in the 2nd tier boxes closest to the center.  Patients would then share their coping skills with the group as LRT wrote them out. LRT gave a handout of 99 different coping skills at the end of group.   Goal Area(s) Addressed: Patients will be able to define "coping skills". Patient will identify new coping skills.  Patient will increase communication.   Affect/Mood: N/A   Participation Level: Did not attend    Clinical Observations/Individualized Feedback: Patient did not attend group.   Plan: Continue to engage patient in RT group sessions 2-3x/week.   Jeoffrey BRAVO Celestia, LRT, CTRS 08/15/2024 12:46 PM

## 2024-08-15 NOTE — Progress Notes (Signed)
   08/15/24 0924  Psych Admission Type (Psych Patients Only)  Admission Status Involuntary  Psychosocial Assessment  Patient Complaints None  Eye Contact Brief  Facial Expression Blank  Affect Preoccupied  Speech Logical/coherent  Interaction Cautious;Minimal;Guarded  Motor Activity Other (Comment) (WDL)  Appearance/Hygiene In scrubs  Behavior Characteristics Cooperative  Mood Preoccupied  Thought Process  Coherency Blocking  Content Preoccupation  Delusions None reported or observed  Perception WDL  Hallucination None reported or observed  Judgment Poor  Confusion None  Danger to Self  Current suicidal ideation? Denies  Agreement Not to Harm Self Yes  Description of Agreement Verbal

## 2024-08-15 NOTE — Group Note (Signed)
 Date:  08/15/2024 Time:  8:41 PM  Group Topic/Focus:  Goals Group:   The focus of this group is to help patients establish daily goals to achieve during treatment and discuss how the patient can incorporate goal setting into their daily lives to aide in recovery.    Participation Level:  Did Not Attend  Participation Quality:  none  Affect:  none  Cognitive:  none  Insight: None  Engagement in Group:  none  Modes of Intervention:  none  Additional Comments:  none   Eric Pineda 08/15/2024, 8:41 PM

## 2024-08-15 NOTE — Progress Notes (Signed)
   08/14/24 2300  Psychosocial Assessment  Patient Complaints Anxiety  Eye Contact Fair  Facial Expression Anxious  Affect Preoccupied  Speech Unremarkable  Interaction No initiation  Motor Activity Other (Comment) (WNL)  Appearance/Hygiene In scrubs  Behavior Characteristics Cooperative  Mood Anxious  Aggressive Behavior  Effect No apparent injury  Thought Process  Coherency Disorganized  Content Preoccupation  Delusions None reported or observed  Perception WDL  Hallucination None reported or observed  Judgment Poor  Confusion None  Danger to Self  Current suicidal ideation? Denies  Danger to Others  Danger to Others None reported or observed

## 2024-08-15 NOTE — Group Note (Signed)
 [facility] LCSW Group Therapy Note   Group Date: 08/15/2024 Start Time: 1300 End Time: 1400  Type of Therapy/Topic:  Group Therapy:  Feelings about Diagnosis  Participation Level:  Did Not Attend   Description of Group:    This group will allow patients to explore their thoughts and feelings about diagnoses they have received. Patients will be guided to explore their level of understanding and acceptance of these diagnoses. Facilitator will encourage patients to process their thoughts and feelings about the reactions of others to their diagnosis, and will guide patients in identifying ways to discuss their diagnosis with significant others in their lives. This group will be process-oriented, with patients participating in exploration of their own experiences as well as giving and receiving support and challenge from other group members.   Therapeutic Goals: 1. Patient will demonstrate understanding of diagnosis as evidence by identifying two or more symptoms of the disorder:  2. Patient will be able to express two feelings regarding the diagnosis 3. Patient will demonstrate ability to communicate their needs through discussion and/or role plays  Summary of Patient Progress: Patient did not attend group.   Therapeutic Modalities:   Cognitive Behavioral Therapy Brief Therapy Feelings Identification    Nadara JONELLE Fam, LCSW

## 2024-08-15 NOTE — Group Note (Signed)
 Date:  08/15/2024 Time:  10:09 AM  Group Topic/Focus:  Coping With Mental Health Crisis:   The purpose of this group is to help patients identify strategies for coping with mental health crisis.  Group discusses possible causes of crisis and ways to manage them effectively.    Participation Level:  Did Not Attend  Leigh VEAR Pais 08/15/2024, 10:09 AM

## 2024-08-16 LAB — CBC WITH DIFFERENTIAL/PLATELET
Abs Immature Granulocytes: 0.02 K/uL (ref 0.00–0.07)
Basophils Absolute: 0.1 K/uL (ref 0.0–0.1)
Basophils Relative: 1 %
Eosinophils Absolute: 0.4 K/uL (ref 0.0–0.5)
Eosinophils Relative: 5 %
HCT: 38.8 % — ABNORMAL LOW (ref 39.0–52.0)
Hemoglobin: 13.5 g/dL (ref 13.0–17.0)
Immature Granulocytes: 0 %
Lymphocytes Relative: 33 %
Lymphs Abs: 2.5 K/uL (ref 0.7–4.0)
MCH: 30 pg (ref 26.0–34.0)
MCHC: 34.8 g/dL (ref 30.0–36.0)
MCV: 86.2 fL (ref 80.0–100.0)
Monocytes Absolute: 0.7 K/uL (ref 0.1–1.0)
Monocytes Relative: 9 %
Neutro Abs: 4 K/uL (ref 1.7–7.7)
Neutrophils Relative %: 52 %
Platelets: 283 K/uL (ref 150–400)
RBC: 4.5 MIL/uL (ref 4.22–5.81)
RDW: 12.2 % (ref 11.5–15.5)
WBC: 7.7 K/uL (ref 4.0–10.5)
nRBC: 0 % (ref 0.0–0.2)

## 2024-08-16 MED ORDER — PALIPERIDONE ER 3 MG PO TB24
3.0000 mg | ORAL_TABLET | Freq: Every day | ORAL | Status: DC
  Filled 2024-08-16 (×2): qty 1

## 2024-08-16 NOTE — Group Note (Signed)
 [facility] LCSW Group Therapy Note   Group Date: 08/16/2024 Start Time: 1300 End Time: 1400   Type of Therapy/Topic:  Group Therapy:  Emotion Regulation  Participation Level:  Did Not Attend   Mood:  Description of Group:    The purpose of this group is to assist patients in learning to regulate negative emotions and experience positive emotions. Patients will be guided to discuss ways in which they have been vulnerable to their negative emotions. These vulnerabilities will be juxtaposed with experiences of positive emotions or situations, and patients challenged to use positive emotions to combat negative ones. Special emphasis will be placed on coping with negative emotions in conflict situations, and patients will process healthy conflict resolution skills.  Therapeutic Goals: Patient will identify two positive emotions or experiences to reflect on in order to balance out negative emotions:  Patient will label two or more emotions that they find the most difficult to experience:  Patient will be able to demonstrate positive conflict resolution skills through discussion or role plays:   Summary of Patient Progress:   Patient did not attend.     Therapeutic Modalities:   Cognitive Behavioral Therapy Feelings Identification Dialectical Behavioral Therapy   Eric CHRISTELLA Kerns, LCSW

## 2024-08-16 NOTE — [facility] Counselor (Signed)
 CSW spoke with the patient's mother, Myrick, [phone].  Phone call was assisted with Interpreter services, [place] [phone].  Mother reports that the patient can return to the home at discharge.  She reports that she will provide transportation for patient at discharge.    She reports that patient was admitted because He got really bad.  He got real depressed.  H wouldn't eat.  He was walking towards the road and I couldn't stop him.  She reports that she was afraid that the patient would trespass into a neighbors yard and someone would hurt him.    She denies that pt is a danger to self or others.  She denies that pt has access to weapons.   She reports that she would like for patient to be in treatment, however, pt does not agree to it.  She reports that pt throws his medication away when he gets home and refuses to attend any appointments.   Sherryle Margo, MSW, LCSW 08/16/2024 10:59 AM

## 2024-08-16 NOTE — Group Note (Signed)
 Date:  08/16/2024 Time:  2:05 PM  Group Topic/Focus:  PET THERAPY ROLLO THE DOG CAME TO VISIT.     Participation Level:  Active  Participation Quality:  Appropriate  Affect:  Appropriate  Cognitive:  Appropriate  Insight: Appropriate  Engagement in Group:  Engaged  Modes of Intervention:  Activity  Additional Comments:    Linetta Regner 08/16/2024, 2:05 PM

## 2024-08-16 NOTE — Progress Notes (Signed)
   08/16/24 2057  Psych Admission Type (Psych Patients Only)  Admission Status Involuntary  Psychosocial Assessment  Patient Complaints None  Eye Contact Brief  Facial Expression Flat  Affect Preoccupied  Speech Logical/coherent  Interaction No initiation  Motor Activity Pacing  Appearance/Hygiene Unremarkable  Behavior Characteristics Guarded;Calm  Mood Preoccupied  Aggressive Behavior  Effect No apparent injury  Thought Process  Coherency Blocking  Content Preoccupation  Delusions None reported or observed  Perception WDL  Hallucination None reported or observed  Judgment Poor  Confusion None  Danger to Self  Current suicidal ideation? Denies  Agreement Not to Harm Self Yes  Description of Agreement Verbal  Danger to Others  Danger to Others None reported or observed

## 2024-08-16 NOTE — Group Note (Signed)
 Date:  08/16/2024 Time:  7:22 PM  Group Topic/Focus:  Developing a Wellness Toolbox:   The focus of this group is to help patients develop a wellness toolbox with skills and strategies to promote recovery upon discharge.     Participation Level:  Did Not Attend   [name] [name] Ayari Liwanag 08/16/2024, 7:22 PM

## 2024-08-16 NOTE — Progress Notes (Signed)
   08/16/24 1700  Psych Admission Type (Psych Patients Only)  Admission Status Involuntary  Psychosocial Assessment  Patient Complaints None  Eye Contact Brief  Facial Expression Blank  Affect Preoccupied  Speech Logical/coherent  Interaction No initiation;Minimal  Motor Activity Pacing  Appearance/Hygiene Unremarkable  Behavior Characteristics Guarded  Mood Preoccupied  Aggressive Behavior  Effect No apparent injury  Thought Process  Coherency Blocking  Content Preoccupation  Delusions None reported or observed  Perception WDL  Hallucination None reported or observed  Judgment Poor  Confusion None  Danger to Self  Current suicidal ideation? Denies  Agreement Not to Harm Self Yes  Description of Agreement Verbal  Danger to Others  Danger to Others None reported or observed

## 2024-08-16 NOTE — Plan of Care (Signed)
   Problem: Education: Goal: Knowledge of Eric Pineda General Education information/materials will improve Outcome: Progressing Goal: Emotional status will improve Outcome: Progressing Goal: Mental status will improve Outcome: Progressing Goal: Verbalization of understanding the information provided will improve Outcome: Progressing   Problem: Activity: Goal: Interest or engagement in activities will improve Outcome: Progressing Goal: Sleeping patterns will improve Outcome: Progressing   Problem: Coping: Goal: Ability to verbalize frustrations and anger appropriately will improve Outcome: Progressing Goal: Ability to demonstrate self-control will improve Outcome: Progressing

## 2024-08-16 NOTE — Plan of Care (Signed)

## 2024-08-16 NOTE — [facility] Suicide Risk Assessment (Signed)
 [facility] INPATIENT:  Family/Significant Other Suicide Prevention Education  Suicide Prevention Education:  Education Completed; Myrick, mother, [phone],  (name of family member/significant other) has been identified by the patient as the family member/significant other with whom the patient will be residing, and identified as the person(s) who will aid the patient in the event of a mental health crisis (suicidal ideations/suicide attempt).  With written consent from the patient, the family member/significant other has been provided the following suicide prevention education, prior to the and/or following the discharge of the patient.  The suicide prevention education provided includes the following: Suicide risk factors Suicide prevention and interventions National Suicide Hotline telephone number [facility] assessment telephone number [facility] Emergency Assistance 911 [facility] and/or Residential Mobile Crisis Unit telephone number  Request made of family/significant other to: Remove weapons (e.g., guns, rifles, knives), all items previously/currently identified as safety concern.   Remove drugs/medications (over-the-counter, prescriptions, illicit drugs), all items previously/currently identified as a safety concern.  The family member/significant other verbalizes understanding of the suicide prevention education information provided.  The family member/significant other agrees to remove the items of safety concern listed above.  Sherryle JINNY Margo 08/16/2024, 10:54 AM

## 2024-08-16 NOTE — Group Note (Signed)
 Date:  08/16/2024 Time:  10:28 PM  Group Topic/Focus:  Wrap-Up Group:   The focus of this group is to help patients review their daily goal of treatment and discuss progress on daily workbooks.    Participation Level:  Did Not Attend    Eric Pineda Servant 08/16/2024, 10:28 PM

## 2024-08-16 NOTE — [facility] Counselor (Signed)
 Patient declined referral for aftercare.  Sherryle Margo, MSW, LCSW 08/16/2024 8:10 AM

## 2024-08-16 NOTE — Progress Notes (Addendum)
 [facility] MD Progress Note  08/16/2024 1:04 PM Eric Pineda  MRN:  [id number]   Subjective:  Chart reviewed, case discussed in multidisciplinary meeting, patient seen during rounds.   11/26: On interview today, patient is noted to be calm and cooperative, alert and oriented.  He displays a flat affect.  He is oriented to self but not able to give date, location, or situation.  He does not respond appropriately to questions, often answering with a yes or no answer.  He is unable to discuss what he had for breakfast, stating I don't remember.  He continues to lack insight.  He reports good sleep and appetite.  He denies SI/HI/plan and denies hallucinations.  He does not voice any concerns or complaints at this time.  He reports good mood.  He denies symptoms of depression or anxiety.  He did not require any behavioral PRNs overnight.  He interacts minimally with peers and staff.  He is noted to be pacing the halls of the unit or laying in bed.  He is not observed responding to internal stimuli on exam today, though does continue to display thought blocking. Will repeat CBC due to elevated WBC on initial hospital presentation. Per chart review, patient previously responded well to Invega , will restart.  Patient has repeatedly refused EKG. Patient's most recent EKG from May 2025 showed QTc of 375 with normal sinus rhythm.   Collateral obtained from patient's sister, Elijah Phommachanh.  She reports patient's most recent psychiatric hospitalization was in May 2025.  She reports noncompliance with medication or outpatient follow-up.  She states he has not taken any psychotropic medication since his last hospitalization.  She also reports patient was set up with ACT team but patient refused their visits.  She reports behavior at home includes pacing, laying in bed for hours, or standing in the same place at home for hours.  She has observed him responding to internal stimuli, talking to himself or laughing  inappropriately.  She reports some days where patient is nonverbal, and other days are patient is more interactive.  She reports patient was previously diagnosed with schizophrenia approximately 2 years ago.  She denies history of developmental or cognitive abnormalities.  She reports history of drug use in patient but is not aware of any recent drug use. She denies any recent episodes of violence.  She denies knowledge of previous suicide attempt or self-harm behaviors.  She reports previous overdose of cocaine at age 64 or 75.  Past Psychiatric History: see h&P Family History: History reviewed. No pertinent family history. Social History:  Social History   Substance and Sexual Activity  Alcohol Use Never     Social History   Substance and Sexual Activity  Drug Use Yes    Social History   Socioeconomic History   Marital status: Single    Spouse name: Not on file   Number of children: Not on file   Years of education: Not on file   Highest education level: Not on file  Occupational History   Not on file  Tobacco Use   Smoking status: Never   Smokeless tobacco: Never   Tobacco comments:    Patient denied current use of tobacco  Vaping Use   Vaping status: Never Used  Substance and Sexual Activity   Alcohol use: Never   Drug use: Yes   Sexual activity: Not on file  Other Topics Concern   Not on file  Social History Narrative   Not on file  Social Drivers of Corporate Investment Banker Strain: Not on file  Food Insecurity: Food Insecurity Present (08/14/2024)   Hunger Vital Sign    Worried About Running Out of Food in the Last Year: Sometimes true    Ran Out of Food in the Last Year: Never true  Transportation Needs: No Transportation Needs (08/14/2024)   PRAPARE - Administrator, Civil Service (Medical): No    Lack of Transportation (Non-Medical): No  Physical Activity: Not on file  Stress: Not on file  Social Connections: Unknown (01/17/2024)   Social  Connection and Isolation Panel    Frequency of Communication with Friends and Family: Patient declined    Frequency of Social Gatherings with Friends and Family: Patient declined    Attends Religious Services: Patient declined    Database Administrator or Organizations: Patient declined    Attends Banker Meetings: Patient declined    Marital Status: Never married   Past Medical History:  Past Medical History:  Diagnosis Date   Bizarre behavior 12/02/2019   History reviewed. No pertinent surgical history.  Current Medications: Current Facility-Administered Medications  Medication Dose Route Frequency Provider Last Rate Last Admin   acetaminophen  (TYLENOL ) tablet 650 mg  650 mg Oral Q6H PRN Bobbitt, Shalon E, NP       alum & mag hydroxide-simeth (MAALOX/MYLANTA) 200-200-20 MG/5ML suspension 30 mL  30 mL Oral Q4H PRN Bobbitt, Shalon E, NP       haloperidol  (HALDOL ) tablet 5 mg  5 mg Oral TID PRN Bobbitt, Shalon E, NP       And   diphenhydrAMINE  (BENADRYL ) capsule 50 mg  50 mg Oral TID PRN Bobbitt, Shalon E, NP       haloperidol  lactate (HALDOL ) injection 5 mg  5 mg Intramuscular TID PRN Bobbitt, Shalon E, NP       And   diphenhydrAMINE  (BENADRYL ) injection 50 mg  50 mg Intramuscular TID PRN Bobbitt, Shalon E, NP       And   LORazepam  (ATIVAN ) injection 2 mg  2 mg Intramuscular TID PRN Bobbitt, Shalon E, NP       haloperidol  lactate (HALDOL ) injection 10 mg  10 mg Intramuscular TID PRN Bobbitt, Shalon E, NP       And   diphenhydrAMINE  (BENADRYL ) injection 50 mg  50 mg Intramuscular TID PRN Bobbitt, Shalon E, NP       And   LORazepam  (ATIVAN ) injection 2 mg  2 mg Intramuscular TID PRN Bobbitt, Shalon E, NP       hydrOXYzine  (ATARAX ) tablet 25 mg  25 mg Oral TID PRN Bobbitt, Shalon E, NP       magnesium  hydroxide (MILK OF MAGNESIA) suspension 30 mL  30 mL Oral Daily PRN Bobbitt, Shalon E, NP       traZODone  (DESYREL ) tablet 50 mg  50 mg Oral QHS PRN Bobbitt, Shalon E, NP         Lab Results: No results found for this or any previous visit (from the past 48 hours).  Blood Alcohol level:  Lab Results  Component Value Date   [facility] <15 08/13/2024   ETH <15 01/14/2024    Metabolic Disorder Labs: Lab Results  Component Value Date   HGBA1C 5.0 01/18/2023   MPG 96.8 01/18/2023   MPG 99.67 12/03/2019   No results found for: PROLACTIN Lab Results  Component Value Date   CHOL 159 01/18/2023   TRIG 103 01/18/2023   HDL 57 01/18/2023  CHOLHDL 2.8 01/18/2023   VLDL 21 01/18/2023   LDLCALC 81 01/18/2023   LDLCALC 104 (H) 10/04/2020    Physical Findings: AIMS:  , ,  ,  ,    CIWA:    COWS:      Psychiatric Specialty Exam:  Presentation  General Appearance:  Appropriate for Environment  Eye Contact: Fleeting  Speech: Clear and Coherent  Speech Volume: Normal    Mood and Affect  Mood: Euthymic  Affect: Constricted   Thought Process  Thought Processes: Other (comment) (Thought blocking)  Orientation:Partial (Oriented to person; unable to give date, location, or situation)  Thought Content:Scattered  Hallucinations:Hallucinations: None  Ideas of Reference:None  Suicidal Thoughts:Suicidal Thoughts: No  Homicidal Thoughts:Homicidal Thoughts: No   Sensorium  Memory: Immediate Fair  Judgment: Impaired  Insight: Lacking   Executive Functions  Concentration: Fair  Attention Span: Fair  Recall: Fair  Fund of Knowledge: Poor  Language: Poor   Psychomotor Activity  Psychomotor Activity: Psychomotor Activity: Normal  Musculoskeletal: Strength & Muscle Tone: within normal limits Gait & Station: normal Assets  Assets: Social Support    Physical Exam: Physical Exam ROS Blood pressure 102/68, pulse 71, temperature (!) 97.5 F (36.4 C), temperature source Oral, resp. rate 18, height 5' 10 (1.778 m), weight 75.8 kg, SpO2 98%. Body mass index is 23.96 kg/m.  Diagnosis: Principal Problem:    Undifferentiated schizophrenia (HCC)   PLAN: Safety and Monitoring:  -- Voluntary admission to inpatient psychiatric unit for safety, stabilization and treatment  -- Daily contact with patient to assess and evaluate symptoms and progress in treatment  -- Patient's case to be discussed in multi-disciplinary team meeting  -- Observation Level : q15 minute checks  -- Vital signs:  q12 hours  -- Precautions: suicide, elopement, and assault -- Encouraged patient to participate in unit milieu and in scheduled group therapies  2. Psychiatric Treatment:  Scheduled Medications: Invega  3 mg once daily    -- The risks/benefits/side-effects/alternatives to this medication were discussed in detail with the patient and time was given for questions. The patient consents to medication trial.  3. Medical Issues Being Addressed:  No acute concerns   4. Discharge Planning:   -- Social work and case management to assist with discharge planning and identification of hospital follow-up needs prior to discharge  -- Estimated LOS: 5-7 days  Case discussed with supervising physician Dr. Jadapalle, who is in agreement with treatment plan.  Marnette Perkins LITTIE Lukes, PA-C 08/16/2024, 1:04 PM

## 2024-08-16 NOTE — BH IP Treatment Plan (Signed)
 Interdisciplinary Treatment and Diagnostic Plan Update  08/16/2024 Time of Session: 10:16 AM Eric Pineda MRN: [id number]  Principal Diagnosis: Undifferentiated schizophrenia (HCC)  Secondary Diagnoses: Principal Problem:   Undifferentiated schizophrenia (HCC)   Current Medications:  Current Facility-Administered Medications  Medication Dose Route Frequency Provider Last Rate Last Admin   acetaminophen  (TYLENOL ) tablet 650 mg  650 mg Oral Q6H PRN Bobbitt, Shalon E, NP       alum & mag hydroxide-simeth (MAALOX/MYLANTA) 200-200-20 MG/5ML suspension 30 mL  30 mL Oral Q4H PRN Bobbitt, Shalon E, NP       haloperidol  (HALDOL ) tablet 5 mg  5 mg Oral TID PRN Bobbitt, Shalon E, NP       And   diphenhydrAMINE  (BENADRYL ) capsule 50 mg  50 mg Oral TID PRN Bobbitt, Shalon E, NP       haloperidol  lactate (HALDOL ) injection 5 mg  5 mg Intramuscular TID PRN Bobbitt, Shalon E, NP       And   diphenhydrAMINE  (BENADRYL ) injection 50 mg  50 mg Intramuscular TID PRN Bobbitt, Shalon E, NP       And   LORazepam  (ATIVAN ) injection 2 mg  2 mg Intramuscular TID PRN Bobbitt, Shalon E, NP       haloperidol  lactate (HALDOL ) injection 10 mg  10 mg Intramuscular TID PRN Bobbitt, Shalon E, NP       And   diphenhydrAMINE  (BENADRYL ) injection 50 mg  50 mg Intramuscular TID PRN Bobbitt, Shalon E, NP       And   LORazepam  (ATIVAN ) injection 2 mg  2 mg Intramuscular TID PRN Bobbitt, Shalon E, NP       hydrOXYzine  (ATARAX ) tablet 25 mg  25 mg Oral TID PRN Bobbitt, Shalon E, NP       magnesium  hydroxide (MILK OF MAGNESIA) suspension 30 mL  30 mL Oral Daily PRN Bobbitt, Shalon E, NP       traZODone  (DESYREL ) tablet 50 mg  50 mg Oral QHS PRN Bobbitt, Shalon E, NP       PTA Medications: Medications Prior to Admission  Medication Sig Dispense Refill Last Dose/Taking   benztropine  (COGENTIN ) 0.5 MG tablet Take 1 tablet (0.5 mg total) by mouth daily. (Patient not taking: Reported on 08/14/2024) 30 tablet 0    paliperidone   (INVEGA  SUSTENNA) 234 MG/1.5ML injection Inject 234 mg into the muscle every 28 (twenty-eight) days. Next dose due q28 days, 02/24/2024 (Patient not taking: Reported on 08/14/2024) 1.5 mL 0     Patient Stressors: Health problems   Medication change or noncompliance    Patient Strengths: Motivation for treatment/growth  Physical Health  Supportive family/friends   Treatment Modalities: Medication Management, Group therapy, Case management,  1 to 1 session with clinician, Psychoeducation, Recreational therapy.   Physician Treatment Plan for Primary Diagnosis: Undifferentiated schizophrenia (HCC) Long Term Goal(s): Improvement in symptoms so as ready for discharge   Short Term Goals: Ability to identify changes in lifestyle to reduce recurrence of condition will improve Ability to verbalize feelings will improve Ability to maintain clinical measurements within normal limits will improve Compliance with prescribed medications will improve  Medication Management: Evaluate patient's response, side effects, and tolerance of medication regimen.  Therapeutic Interventions: 1 to 1 sessions, Unit Group sessions and Medication administration.  Evaluation of Outcomes: Not Met  Physician Treatment Plan for Secondary Diagnosis: Principal Problem:   Undifferentiated schizophrenia (HCC)  Long Term Goal(s): Improvement in symptoms so as ready for discharge   Short Term Goals: Ability to identify  changes in lifestyle to reduce recurrence of condition will improve Ability to verbalize feelings will improve Ability to maintain clinical measurements within normal limits will improve Compliance with prescribed medications will improve     Medication Management: Evaluate patient's response, side effects, and tolerance of medication regimen.  Therapeutic Interventions: 1 to 1 sessions, Unit Group sessions and Medication administration.  Evaluation of Outcomes: Not Met   RN Treatment Plan for  Primary Diagnosis: Undifferentiated schizophrenia (HCC) Long Term Goal(s): Knowledge of disease and therapeutic regimen to maintain health will improve  Short Term Goals: Ability to verbalize frustration and anger appropriately will improve, Ability to demonstrate self-control, Ability to participate in decision making will improve, Ability to verbalize feelings will improve, Ability to disclose and discuss suicidal ideas, and Ability to identify and develop effective coping behaviors will improve  Medication Management: RN will administer medications as ordered by provider, will assess and evaluate patient's response and provide education to patient for prescribed medication. RN will report any adverse and/or side effects to prescribing provider.  Therapeutic Interventions: 1 on 1 counseling sessions, Psychoeducation, Medication administration, Evaluate responses to treatment, Monitor vital signs and CBGs as ordered, Perform/monitor CIWA, COWS, AIMS and Fall Risk screenings as ordered, Perform wound care treatments as ordered.  Evaluation of Outcomes: Not Met   LCSW Treatment Plan for Primary Diagnosis: Undifferentiated schizophrenia (HCC) Long Term Goal(s): Safe transition to appropriate next level of care at discharge, Engage patient in therapeutic group addressing interpersonal concerns.  Short Term Goals: Engage patient in aftercare planning with referrals and resources, Increase social support, Increase ability to appropriately verbalize feelings, Increase emotional regulation, Facilitate acceptance of mental health diagnosis and concerns, Facilitate patient progression through stages of change regarding substance use diagnoses and concerns, Identify triggers associated with mental health/substance abuse issues, and Increase skills for wellness and recovery  Therapeutic Interventions: Assess for all discharge needs, 1 to 1 time with Social worker, Explore available resources and support  systems, Assess for adequacy in community support network, Educate family and significant other(s) on suicide prevention, Complete Psychosocial Assessment, Interpersonal group therapy.  Evaluation of Outcomes: Not Met   Progress in Treatment: Attending groups: No. Participating in groups: No. Taking medication as prescribed: Yes. Toleration medication: Yes. Family/Significant other contact made: No, will contact:  Myrick, mother, [phone] Patient understands diagnosis: Yes. and No. Discussing patient identified problems/goals with staff: No. Medical problems stabilized or resolved: Yes. Denies suicidal/homicidal ideation: Yes. and No. Issues/concerns per patient self-inventory: Yes. and No. Other: None  New problem(s) identified: No, Describe:  None  New Short Term/Long Term Goal(s):detox, elimination of symptoms of psychosis, medication management for mood stabilization; elimination of SI thoughts; development of comprehensive mental wellness/sobriety plan.    Patient Goals:  Patient declined.   Discharge Plan or Barriers: CSW to assist with the development of appropriate discharge plan.    Reason for Continuation of Hospitalization: Anxiety Delusions  Depression Hallucinations Suicidal ideation  Estimated Length of Stay: 1-7 days.   Last 3 [place] Suicide Severity Risk Score: Flowsheet Row Admission (Current) from 08/14/2024 in [facility] INPATIENT BEHAV[facility] MEDICINE ED from 08/13/2024 in [facility] Emergency Department at [facility] Admission (Discharged) from 01/17/2024 in [facility] INPATIENT [facility] MEDICINE  C-SSRS RISK CATEGORY No Risk No Risk No Risk    Last PHQ 2/9 Scores:     No data to display          Scribe for Treatment Team: Juliette Standre M Makynlie Rossini, KEN 08/16/2024 10:23 AM

## 2024-08-16 NOTE — Plan of Care (Signed)
  Problem: Education: Goal: Emotional status will improve Outcome: Not Progressing   Problem: Education: Goal: Mental status will improve Outcome: Not Progressing   Problem: Education: Goal: Verbalization of understanding the information provided will improve Outcome: Not Progressing   Problem: Activity: Goal: Interest or engagement in activities will improve Outcome: Progressing   Problem: Activity: Goal: Sleeping patterns will improve Outcome: Progressing   Problem: Health Behavior/Discharge Planning: Goal: Compliance with treatment plan for underlying cause of condition will improve Outcome: Progressing   Problem: Physical Regulation: Goal: Ability to maintain clinical measurements within normal limits will improve Outcome: Progressing   Problem: Safety: Goal: Periods of time without injury will increase Outcome: Progressing

## 2024-08-17 LAB — VITAMIN D 25 HYDROXY (VIT D DEFICIENCY, FRACTURES): Vit D, 25-Hydroxy: 38.98 ng/mL (ref 30–100)

## 2024-08-17 LAB — VITAMIN B12: Vitamin B-12: 280 pg/mL (ref 180–914)

## 2024-08-17 MED ORDER — PALIPERIDONE ER 3 MG PO TB24
3.0000 mg | ORAL_TABLET | Freq: Every day | ORAL | Status: DC
  Administered 2024-08-17 – 2024-08-18 (×2): 3 mg via ORAL

## 2024-08-17 NOTE — Group Note (Signed)
 Date:  08/17/2024 Time:  9:23 PM  Group Topic/Focus:  Coping With Mental Health Crisis:   The purpose of this group is to help patients identify strategies for coping with mental health crisis.  Group discusses possible causes of crisis and ways to manage them effectively.    Pt did not attend group.  Mignon Bechler L 08/17/2024, 9:23 PM

## 2024-08-17 NOTE — Progress Notes (Signed)
   08/17/24 1400  Psych Admission Type (Psych Patients Only)  Admission Status Involuntary  Psychosocial Assessment  Patient Complaints None  Eye Contact Brief  Facial Expression Flat  Affect Preoccupied  Speech Logical/coherent  Interaction No initiation  Motor Activity Pacing  Appearance/Hygiene Unremarkable  Behavior Characteristics Guarded;Calm  Mood Preoccupied  Aggressive Behavior  Effect No apparent injury  Thought Process  Coherency WDL  Content Preoccupation  Delusions None reported or observed  Perception WDL  Hallucination None reported or observed  Judgment Poor  Confusion None  Danger to Self  Current suicidal ideation? Denies  Danger to Others  Danger to Others None reported or observed

## 2024-08-17 NOTE — Progress Notes (Signed)
 [facility] MD Progress Note  08/17/2024 5:08 PM Eric Pineda  MRN:  [id number]   Subjective:  Chart reviewed, case discussed in multidisciplinary meeting, patient seen during rounds.   11/27: On interview today patient is noted to be calm and cooperative, engages minimally in interview.  He continues to display flat affect and thought blocking.  He continues to primarily answer questions with yes or no answers.  Per nursing report, patient initially refused Invega  last night but did eventually take it today.  Provider discussed treatment plan with patient, patient denied any questions or concerns regarding medication or treatment plan.  He reports good sleep and appetite.  He denies SI/HI/plan and denies hallucinations.  He is unable to participate in meaningful conversation.  He does not voice any concerns or complaints at this time.  11/26: On interview today, patient is noted to be calm and cooperative, alert and oriented.  He displays a flat affect.  He is oriented to self but not able to give date, location, or situation.  He does not respond appropriately to questions, often answering with a yes or no answer.  He is unable to discuss what he had for breakfast, stating I don't remember.  He continues to lack insight.  He reports good sleep and appetite.  He denies SI/HI/plan and denies hallucinations.  He does not voice any concerns or complaints at this time.  He reports good mood.  He denies symptoms of depression or anxiety.  He did not require any behavioral PRNs overnight.  He interacts minimally with peers and staff.  He is noted to be pacing the halls of the unit or laying in bed.  He is not observed responding to internal stimuli on exam today, though does continue to display thought blocking. Will repeat CBC due to elevated WBC on initial hospital presentation. Per chart review, patient previously responded well to Invega , will restart.  Patient has repeatedly refused EKG. Patient's most recent  EKG from May 2025 showed QTc of 375 with normal sinus rhythm.   Collateral obtained from patient's sister, Keylor Rands.  She reports patient's most recent psychiatric hospitalization was in May 2025.  She reports noncompliance with medication or outpatient follow-up.  She states he has not taken any psychotropic medication since his last hospitalization.  She also reports patient was set up with ACT team but patient refused their visits.  She reports behavior at home includes pacing, laying in bed for hours, or standing in the same place at home for hours.  She has observed him responding to internal stimuli, talking to himself or laughing inappropriately.  She reports some days where patient is nonverbal, and other days are patient is more interactive.  She reports patient was previously diagnosed with schizophrenia approximately 2 years ago.  She denies history of developmental or cognitive abnormalities.  She reports history of drug use in patient but is not aware of any recent drug use. She denies any recent episodes of violence.  She denies knowledge of previous suicide attempt or self-harm behaviors.  She reports previous overdose of cocaine at age 78 or 62.  Past Psychiatric History: see h&P Family History: History reviewed. No pertinent family history. Social History:  Social History   Substance and Sexual Activity  Alcohol Use Never     Social History   Substance and Sexual Activity  Drug Use Yes    Social History   Socioeconomic History   Marital status: Single    Spouse name: Not on file  Number of children: Not on file   Years of education: Not on file   Highest education level: Not on file  Occupational History   Not on file  Tobacco Use   Smoking status: Never   Smokeless tobacco: Never   Tobacco comments:    Patient denied current use of tobacco  Vaping Use   Vaping status: Never Used  Substance and Sexual Activity   Alcohol use: Never   Drug use: Yes   Sexual  activity: Not on file  Other Topics Concern   Not on file  Social History Narrative   Not on file   Social Drivers of Health   Financial Resource Strain: Not on file  Food Insecurity: Food Insecurity Present (08/14/2024)   Hunger Vital Sign    Worried About Running Out of Food in the Last Year: Sometimes true    Ran Out of Food in the Last Year: Never true  Transportation Needs: No Transportation Needs (08/14/2024)   PRAPARE - Administrator, Civil Service (Medical): No    Lack of Transportation (Non-Medical): No  Physical Activity: Not on file  Stress: Not on file  Social Connections: Unknown (01/17/2024)   Social Connection and Isolation Panel    Frequency of Communication with Friends and Family: Patient declined    Frequency of Social Gatherings with Friends and Family: Patient declined    Attends Religious Services: Patient declined    Database Administrator or Organizations: Patient declined    Attends Banker Meetings: Patient declined    Marital Status: Never married   Past Medical History:  Past Medical History:  Diagnosis Date   Bizarre behavior 12/02/2019   History reviewed. No pertinent surgical history.  Current Medications: Current Facility-Administered Medications  Medication Dose Route Frequency Provider Last Rate Last Admin   acetaminophen  (TYLENOL ) tablet 650 mg  650 mg Oral Q6H PRN Bobbitt, Shalon E, NP       alum & mag hydroxide-simeth (MAALOX/MYLANTA) 200-200-20 MG/5ML suspension 30 mL  30 mL Oral Q4H PRN Bobbitt, Shalon E, NP       haloperidol  (HALDOL ) tablet 5 mg  5 mg Oral TID PRN Bobbitt, Shalon E, NP       And   diphenhydrAMINE  (BENADRYL ) capsule 50 mg  50 mg Oral TID PRN Bobbitt, Shalon E, NP       haloperidol  lactate (HALDOL ) injection 5 mg  5 mg Intramuscular TID PRN Bobbitt, Shalon E, NP       And   diphenhydrAMINE  (BENADRYL ) injection 50 mg  50 mg Intramuscular TID PRN Bobbitt, Shalon E, NP       And   LORazepam   (ATIVAN ) injection 2 mg  2 mg Intramuscular TID PRN Bobbitt, Shalon E, NP       haloperidol  lactate (HALDOL ) injection 10 mg  10 mg Intramuscular TID PRN Bobbitt, Shalon E, NP       And   diphenhydrAMINE  (BENADRYL ) injection 50 mg  50 mg Intramuscular TID PRN Bobbitt, Shalon E, NP       And   LORazepam  (ATIVAN ) injection 2 mg  2 mg Intramuscular TID PRN Bobbitt, Shalon E, NP       hydrOXYzine  (ATARAX ) tablet 25 mg  25 mg Oral TID PRN Bobbitt, Shalon E, NP       magnesium  hydroxide (MILK OF MAGNESIA) suspension 30 mL  30 mL Oral Daily PRN Bobbitt, Shalon E, NP       paliperidone  (INVEGA ) 24 hr tablet 3 mg  3 mg Oral Daily Georgi Tuel L, PA-C   3 mg at 08/17/24 1254   traZODone  (DESYREL ) tablet 50 mg  50 mg Oral QHS PRN Bobbitt, Shalon E, NP        Lab Results:  Results for orders placed or performed during the hospital encounter of 08/14/24 (from the past 48 hours)  CBC with Differential/Platelet     Status: Abnormal   Collection Time: 08/16/24  4:22 PM  Result Value Ref Range   WBC 7.7 4.0 - 10.5 K/uL   RBC 4.50 4.22 - 5.81 MIL/uL   Hemoglobin 13.5 13.0 - 17.0 g/dL   HCT 61.1 (L) 60.9 - 47.9 %   MCV 86.2 80.0 - 100.0 fL   MCH 30.0 26.0 - 34.0 pg   MCHC 34.8 30.0 - 36.0 g/dL   RDW 87.7 88.4 - 84.4 %   Platelets 283 150 - 400 K/uL   nRBC 0.0 0.0 - 0.2 %   Neutrophils Relative % 52 %   Neutro Abs 4.0 1.7 - 7.7 K/uL   Lymphocytes Relative 33 %   Lymphs Abs 2.5 0.7 - 4.0 K/uL   Monocytes Relative 9 %   Monocytes Absolute 0.7 0.1 - 1.0 K/uL   Eosinophils Relative 5 %   Eosinophils Absolute 0.4 0.0 - 0.5 K/uL   Basophils Relative 1 %   Basophils Absolute 0.1 0.0 - 0.1 K/uL   Immature Granulocytes 0 %   Abs Immature Granulocytes 0.02 0.00 - 0.07 K/uL    Comment: Performed at [facility], [address] [place], [place] [address]  VITAMIN D  25 Hydroxy (Vit-D Deficiency, Fractures)     Status: None   Collection Time: 08/16/24  7:55 PM  Result Value Ref Range   Vit D,  25-Hydroxy 38.98 30 - 100 ng/mL    Comment: (NOTE) Vitamin D  deficiency has been defined by the Institute of Medicine  and an Endocrine Society practice guideline as a level of serum 25-OH  vitamin D  less than 20 ng/mL (1,2). The Endocrine Society went on to  further define vitamin D  insufficiency as a level between 21 and 29  ng/mL (2).  1. IOM (Institute of Medicine). 2010. Dietary reference intakes for  calcium and D. [place]: [employer]. 2. Holick MF, Binkley [place], Bischoff-Ferrari HA, et al. Evaluation,  treatment, and prevention of vitamin D  deficiency: an Endocrine  Society clinical practice guideline, JCEM. 2011 Jul; 96(7): 1911-30.  Performed at [facility], [address], [place], [place] [address]   Vitamin B12     Status: None   Collection Time: 08/16/24  7:55 PM  Result Value Ref Range   Vitamin B-12 280 180 - 914 pg/mL    Comment: (NOTE) This assay is not validated for testing neonatal or myeloproliferative syndrome specimens for Vitamin B12 levels. Performed at [facility], [address], [place], [place] [address]     Blood Alcohol level:  Lab Results  Component Value Date   [facility] <15 08/13/2024   ETH <15 01/14/2024    Metabolic Disorder Labs: Lab Results  Component Value Date   HGBA1C 5.0 01/18/2023   MPG 96.8 01/18/2023   MPG 99.67 12/03/2019   No results found for: PROLACTIN Lab Results  Component Value Date   CHOL 159 01/18/2023   TRIG 103 01/18/2023   HDL 57 01/18/2023   CHOLHDL 2.8 01/18/2023   VLDL 21 01/18/2023   LDLCALC 81 01/18/2023   LDLCALC 104 (H) 10/04/2020  Physical Findings: AIMS:  , ,  ,  ,    CIWA:    COWS:      Psychiatric Specialty Exam:  Presentation  General Appearance:  Appropriate for Environment  Eye Contact: Fleeting  Speech: Clear and Coherent  Speech Volume: Normal    Mood and Affect  Mood: Euthymic  Affect: Constricted   Thought Process  Thought  Processes: Other (comment) (Thought blocking)  Orientation:Partial (Oriented to person; unable to give date, location, or situation)  Thought Content:Scattered  Hallucinations: Denies  Ideas of Reference:None  Suicidal Thoughts: Denies  Homicidal Thoughts: Denies   Sensorium  Memory: Immediate Fair  Judgment: Impaired  Insight: Lacking   Executive Functions  Concentration: Fair  Attention Span: Fair  Recall: Fair  Fund of Knowledge: Poor  Language: Poor   Psychomotor Activity  Psychomotor Activity: Normal  Musculoskeletal: Strength & Muscle Tone: within normal limits Gait & Station: normal Assets  Assets: Social Support    Physical Exam: Physical Exam ROS Blood pressure 122/82, pulse 72, temperature (!) 97.5 F (36.4 C), temperature source Oral, resp. rate 18, height 5' 10 (1.778 m), weight 75.8 kg, SpO2 100%. Body mass index is 23.96 kg/m.  Diagnosis: Principal Problem:   Undifferentiated schizophrenia (HCC)   PLAN: Safety and Monitoring:  -- Voluntary admission to inpatient psychiatric unit for safety, stabilization and treatment  -- Daily contact with patient to assess and evaluate symptoms and progress in treatment  -- Patient's case to be discussed in multi-disciplinary team meeting  -- Observation Level : q15 minute checks  -- Vital signs:  q12 hours  -- Precautions: suicide, elopement, and assault -- Encouraged patient to participate in unit milieu and in scheduled group therapies  2. Psychiatric Treatment:  Scheduled Medications: Invega  3 mg once daily    -- The risks/benefits/side-effects/alternatives to this medication were discussed in detail with the patient and time was given for questions. The patient consents to medication trial.  3. Medical Issues Being Addressed:  No acute concerns   4. Discharge Planning:   -- Social work and case management to assist with discharge planning and identification of hospital  follow-up needs prior to discharge  -- Estimated LOS: 5-7 days  Case discussed with supervising physician Dr. Jadapalle, who is in agreement with treatment plan.  Camelia LITTIE Lukes, PA-C 08/17/2024, 5:08 PM

## 2024-08-17 NOTE — Plan of Care (Signed)

## 2024-08-17 NOTE — Group Note (Signed)
 Recreation Therapy Group Note   Group Topic:Emotion Expression  Group Date: 08/17/2024 Start Time: 1015 End Time: 1040 Facilitators: Celestia Jeoffrey BRAVO, LRT, CTRS Location: Craft Room  Group Description: Gratitude Journaling. Patients and LRT discussed what gratitude means, how we can express it and what it means to us , personally. LRT gave an educational handout on the definition of gratitude as well as the benefits that come from it. LRT played soft music while everyone picked two or three prompts to write about. Once patients were finished, LRT encouraged people to read their journal prompt if they wanted to; or share which prompt they chose to write about. LRT and pts talked about the benefits of journaling and how it can be used as a positive coping skill. LRT and pts processed how showing gratitude towards themselves, and others can be applied to everyday life post-discharge. LRT offered journals and folders to pts afterwards.   Goal Area(s) Addressed:  Patient will identify the definition of gratitude. Patient will learn different gratitude exercises. Patient will practice writing/journaling as a coping skill.    Affect/Mood: N/A   Participation Level: Did not attend    Clinical Observations/Individualized Feedback: Patient did not attend group.   Plan: Continue to engage patient in RT group sessions 2-3x/week.   Jeoffrey BRAVO Celestia, LRT, CTRS 08/17/2024 11:58 AM

## 2024-08-17 NOTE — Plan of Care (Signed)
  Problem: Education: Goal: Knowledge of [facility] Education information/materials will improve 08/17/2024 2227 by Trudy Arland CROME, RN Outcome: Progressing 08/17/2024 1149 by Trudy Arland CROME, RN Outcome: Progressing Goal: Emotional status will improve 08/17/2024 2227 by Trudy Arland CROME, RN Outcome: Progressing 08/17/2024 1149 by Trudy Arland CROME, RN Outcome: Progressing Goal: Mental status will improve 08/17/2024 2227 by Trudy Arland CROME, RN Outcome: Progressing 08/17/2024 1149 by Trudy Arland CROME, RN Outcome: Progressing Goal: Verbalization of understanding the information provided will improve 08/17/2024 2227 by Trudy Arland CROME, RN Outcome: Progressing 08/17/2024 1149 by Trudy Arland CROME, RN Outcome: Progressing   Problem: Activity: Goal: Interest or engagement in activities will improve 08/17/2024 2227 by Trudy Arland CROME, RN Outcome: Progressing 08/17/2024 1149 by Trudy Arland CROME, RN Outcome: Progressing Goal: Sleeping patterns will improve 08/17/2024 2227 by Trudy Arland CROME, RN Outcome: Progressing 08/17/2024 1149 by Trudy Arland CROME, RN Outcome: Progressing   Problem: Coping: Goal: Ability to verbalize frustrations and anger appropriately will improve 08/17/2024 2227 by Trudy Arland CROME, RN Outcome: Progressing 08/17/2024 1149 by Trudy Arland CROME, RN Outcome: Progressing Goal: Ability to demonstrate self-control will improve 08/17/2024 2227 by Trudy Arland CROME, RN Outcome: Progressing 08/17/2024 1149 by Trudy Arland CROME, RN Outcome: Progressing   Problem: Health Behavior/Discharge Planning: Goal: Identification of resources available to assist in meeting health care needs will improve 08/17/2024 2227 by Trudy Arland CROME, RN Outcome: Progressing 08/17/2024 1149 by Trudy Arland CROME, RN Outcome: Progressing Goal: Compliance with treatment plan for underlying cause of condition will improve 08/17/2024 2227 by Trudy Arland CROME,  RN Outcome: Progressing 08/17/2024 1149 by Trudy Arland CROME, RN Outcome: Progressing   Problem: Physical Regulation: Goal: Ability to maintain clinical measurements within normal limits will improve 08/17/2024 2227 by Trudy Arland CROME, RN Outcome: Progressing 08/17/2024 1149 by Trudy Arland CROME, RN Outcome: Progressing   Problem: Safety: Goal: Periods of time without injury will increase 08/17/2024 2227 by Trudy Arland CROME, RN Outcome: Progressing 08/17/2024 1149 by Trudy Arland CROME, RN Outcome: Progressing

## 2024-08-17 NOTE — Group Note (Signed)
 Date:  08/17/2024 Time:  10:34 AM  Group Topic/Focus:  Goals Group:   The focus of this group is to help patients establish daily goals to achieve during treatment and discuss how the patient can incorporate goal setting into their daily lives to aide in recovery.    Participation Level:  Did Not Attend   Skippy LITTIE Bennett 08/17/2024, 10:34 AM

## 2024-08-17 NOTE — Group Note (Deleted)
 Recreation Therapy Group Note   Group Topic:Emotion Expression  Group Date: 08/17/2024 Start Time: 1015 End Time: 1050 Facilitators: Celestia Jeoffrey BRAVO, LRT Location: Craft Room       Affect/Mood: {RT BHH Affect/Mood:26271}   Participation Level: {RT BHH Participation [employer]   Participation Quality: {RT BHH Participation Quality:26268}   Behavior: {RT BHH Group Behavior:26269}   Speech/Thought Process: {RT BHH Speech/Thought:26276}   Insight: {RT BHH Insight:26272}   Judgement: {RT BHH Judgement:26278}   Modes of Intervention: {RT BHH Modes of Intervention:26277}   Patient Response to Interventions:  {RT BHH Patient Response to Intervention:26274}   Education Outcome:  {RT [facility] Education Outcome:26279}   Clinical Observations/Individualized Feedback: *** was *** in their participation of session activities and group discussion. Pt identified ***   Plan: {RT B[facility] Tx Eojw:73719}   Jeoffrey BRAVO Celestia, LRT,  08/17/2024 11:27 AM

## 2024-08-17 NOTE — Group Note (Signed)
 LCSW Group Therapy Note  Group Date: 08/17/2024 Start Time: 1315 End Time: 1400   Type of Therapy and Topic:  Group Therapy - How To Cope with Nervousness about Discharge   Participation Level:  Did Not Attend   Description of Group This process group involved identification of patients' feelings about discharge. Some of them are scheduled to be discharged soon, while others are new admissions, but each of them was asked to share thoughts and feelings surrounding discharge from the hospital. One common theme was that they are excited at the prospect of going home, while another was that many of them are apprehensive about sharing why they were hospitalized. Patients were given the opportunity to discuss these feelings with their peers in preparation for discharge.  Therapeutic Goals  Patient will identify their overall feelings about pending discharge. Patient will think about how they might proactively address issues that they believe will once again arise once they get home (i.e. with parents). Patients will participate in discussion about having hope for change.   Summary of Patient Progress:   X   Therapeutic Modalities Cognitive Behavioral Therapy   Sherryle JINNY Margo, LCSW 08/17/2024  1:59 PM

## 2024-08-17 NOTE — Group Note (Signed)
 Date:  08/17/2024 Time:  8:38 PM  Group Topic/Focus:  Activity Group: The focus of the group is to promote activity for the patients and encourage them to go outside to the courtyard and get some fresh air and some exercise.    Participation Level:  Did Not Attend   [name] [name] Kayin Osment 08/17/2024, 8:38 PM

## 2024-08-17 NOTE — Progress Notes (Signed)
   08/17/24 2200  Psych Admission Type (Psych Patients Only)  Admission Status Involuntary  Psychosocial Assessment  Patient Complaints None  Eye Contact Brief  Facial Expression Flat  Affect Preoccupied  Speech Logical/coherent  Interaction No initiation  Motor Activity Pacing  Appearance/Hygiene Unremarkable  Behavior Characteristics Calm  Mood Preoccupied  Aggressive Behavior  Effect No apparent injury  Thought Process  Coherency WDL  Content Preoccupation  Delusions None reported or observed  Perception WDL  Hallucination None reported or observed  Judgment Poor  Confusion None  Danger to Self  Current suicidal ideation? Denies

## 2024-08-18 MED ORDER — PALIPERIDONE ER 3 MG PO TB24
6.0000 mg | ORAL_TABLET | Freq: Every day | ORAL | Status: DC
  Administered 2024-08-19 – 2024-08-20 (×2): 6 mg via ORAL
  Filled 2024-08-18 (×2): qty 2

## 2024-08-18 NOTE — Progress Notes (Signed)
   08/17/24 2200  Psych Admission Type (Psych Patients Only)  Admission Status Involuntary  Psychosocial Assessment  Patient Complaints None  Eye Contact Brief  Facial Expression Flat  Affect Preoccupied  Speech Logical/coherent  Interaction No initiation  Motor Activity Pacing  Appearance/Hygiene Unremarkable  Behavior Characteristics Calm  Mood Preoccupied  Aggressive Behavior  Effect No apparent injury  Thought Process  Coherency WDL  Content Preoccupation  Delusions None reported or observed  Perception WDL  Hallucination None reported or observed  Judgment Poor  Confusion None  Danger to Self  Current suicidal ideation? Denies  Danger to Others  Danger to Others None reported or observed

## 2024-08-18 NOTE — Progress Notes (Signed)
   08/18/24 0900  Psych Admission Type (Psych Patients Only)  Admission Status Involuntary  Psychosocial Assessment  Patient Complaints None  Eye Contact Brief  Facial Expression Flat  Affect Preoccupied  Speech Logical/coherent  Interaction No initiation  Motor Activity Pacing  Appearance/Hygiene Unremarkable  Behavior Characteristics Guarded  Mood Preoccupied  Thought Process  Coherency WDL  Content Preoccupation  Delusions None reported or observed  Perception WDL  Hallucination None reported or observed  Judgment Poor  Confusion None  Danger to Self  Current suicidal ideation? Denies  Danger to Others  Danger to Others None reported or observed

## 2024-08-18 NOTE — Plan of Care (Signed)
   Problem: Health Behavior/Discharge Planning: Goal: Identification of resources available to assist in meeting health care needs will improve Outcome: Progressing Goal: Compliance with treatment plan for underlying cause of condition will improve Outcome: Progressing   Problem: Physical Regulation: Goal: Ability to maintain clinical measurements within normal limits will improve Outcome: Progressing

## 2024-08-18 NOTE — Group Note (Signed)
 Date:  08/18/2024 Time:  10:10 AM  Group Topic/Focus:  Goals Group:   The focus of this group is to help patients establish daily goals to achieve during treatment and discuss how the patient can incorporate goal setting into their daily lives to aide in recovery.    Participation Level:  Did Not Attend   [name] [name] Dyshaun Bonzo 08/18/2024, 10:10 AM

## 2024-08-18 NOTE — Progress Notes (Signed)
 [facility] MD Progress Note  08/18/2024 11:20 PM Eric Pineda  MRN:  [id number]   Subjective:  Chart reviewed, case discussed in multidisciplinary meeting, patient seen during rounds.  11/28: On interview today, patient is noted to be calm but continues to be minimally engaged in interview.  He continues to display a flat affect and thought blocking.  He continues to answer questions with yes or no answer without further elaboration.  He denies SI/HI/plan and denies hallucinations.  He reports stable sleep and appetite.  He is agreeable to Invega  dose increase.  He has been medication compliant and has maintained safe behaviors on the unit.  He walks away from provider during interview.  11/27: On interview today patient is noted to be calm and cooperative, engages minimally in interview.  He continues to display flat affect and thought blocking.  He continues to primarily answer questions with yes or no answers.  Per nursing report, patient initially refused Invega  last night but did eventually take it today.  Provider discussed treatment plan with patient, patient denied any questions or concerns regarding medication or treatment plan.  He reports good sleep and appetite.  He denies SI/HI/plan and denies hallucinations.  He is unable to participate in meaningful conversation.  He does not voice any concerns or complaints at this time.  11/26: On interview today, patient is noted to be calm and cooperative, alert and oriented.  He displays a flat affect.  He is oriented to self but not able to give date, location, or situation.  He does not respond appropriately to questions, often answering with a yes or no answer.  He is unable to discuss what he had for breakfast, stating I don't remember.  He continues to lack insight.  He reports good sleep and appetite.  He denies SI/HI/plan and denies hallucinations.  He does not voice any concerns or complaints at this time.  He reports good mood.  He denies  symptoms of depression or anxiety.  He did not require any behavioral PRNs overnight.  He interacts minimally with peers and staff.  He is noted to be pacing the halls of the unit or laying in bed.  He is not observed responding to internal stimuli on exam today, though does continue to display thought blocking. Will repeat CBC due to elevated WBC on initial hospital presentation. Per chart review, patient previously responded well to Invega , will restart.  Patient has repeatedly refused EKG. Patient's most recent EKG from May 2025 showed QTc of 375 with normal sinus rhythm.   Collateral obtained from patient's sister, Darren Caldron.  She reports patient's most recent psychiatric hospitalization was in May 2025.  She reports noncompliance with medication or outpatient follow-up.  She states he has not taken any psychotropic medication since his last hospitalization.  She also reports patient was set up with ACT team but patient refused their visits.  She reports behavior at home includes pacing, laying in bed for hours, or standing in the same place at home for hours.  She has observed him responding to internal stimuli, talking to himself or laughing inappropriately.  She reports some days where patient is nonverbal, and other days are patient is more interactive.  She reports patient was previously diagnosed with schizophrenia approximately 2 years ago.  She denies history of developmental or cognitive abnormalities.  She reports history of drug use in patient but is not aware of any recent drug use. She denies any recent episodes of violence.  She denies knowledge  of previous suicide attempt or self-harm behaviors.  She reports previous overdose of cocaine at age 1 or 56.  Past Psychiatric History: see h&P Family History: History reviewed. No pertinent family history. Social History:  Social History   Substance and Sexual Activity  Alcohol Use Never     Social History   Substance and Sexual  Activity  Drug Use Yes    Social History   Socioeconomic History   Marital status: Single    Spouse name: Not on file   Number of children: Not on file   Years of education: Not on file   Highest education level: Not on file  Occupational History   Not on file  Tobacco Use   Smoking status: Never   Smokeless tobacco: Never   Tobacco comments:    Patient denied current use of tobacco  Vaping Use   Vaping status: Never Used  Substance and Sexual Activity   Alcohol use: Never   Drug use: Yes   Sexual activity: Not on file  Other Topics Concern   Not on file  Social History Narrative   Not on file   Social Drivers of Health   Financial Resource Strain: Not on file  Food Insecurity: Food Insecurity Present (08/14/2024)   Hunger Vital Sign    Worried About Running Out of Food in the Last Year: Sometimes true    Ran Out of Food in the Last Year: Never true  Transportation Needs: No Transportation Needs (08/14/2024)   PRAPARE - Administrator, Civil Service (Medical): No    Lack of Transportation (Non-Medical): No  Physical Activity: Not on file  Stress: Not on file  Social Connections: Unknown (01/17/2024)   Social Connection and Isolation Panel    Frequency of Communication with Friends and Family: Patient declined    Frequency of Social Gatherings with Friends and Family: Patient declined    Attends Religious Services: Patient declined    Database Administrator or Organizations: Patient declined    Attends Banker Meetings: Patient declined    Marital Status: Never married   Past Medical History:  Past Medical History:  Diagnosis Date   Bizarre behavior 12/02/2019   History reviewed. No pertinent surgical history.  Current Medications: Current Facility-Administered Medications  Medication Dose Route Frequency Provider Last Rate Last Admin   acetaminophen  (TYLENOL ) tablet 650 mg  650 mg Oral Q6H PRN Bobbitt, Shalon E, NP       alum & mag  hydroxide-simeth (MAALOX/MYLANTA) 200-200-20 MG/5ML suspension 30 mL  30 mL Oral Q4H PRN Bobbitt, Shalon E, NP       haloperidol  (HALDOL ) tablet 5 mg  5 mg Oral TID PRN Bobbitt, Shalon E, NP       And   diphenhydrAMINE  (BENADRYL ) capsule 50 mg  50 mg Oral TID PRN Bobbitt, Shalon E, NP       haloperidol  lactate (HALDOL ) injection 5 mg  5 mg Intramuscular TID PRN Bobbitt, Shalon E, NP       And   diphenhydrAMINE  (BENADRYL ) injection 50 mg  50 mg Intramuscular TID PRN Bobbitt, Shalon E, NP       And   LORazepam  (ATIVAN ) injection 2 mg  2 mg Intramuscular TID PRN Bobbitt, Shalon E, NP       haloperidol  lactate (HALDOL ) injection 10 mg  10 mg Intramuscular TID PRN Bobbitt, Shalon E, NP       And   diphenhydrAMINE  (BENADRYL ) injection 50 mg  50 mg Intramuscular  TID PRN Bobbitt, Shalon E, NP       And   LORazepam  (ATIVAN ) injection 2 mg  2 mg Intramuscular TID PRN Bobbitt, Shalon E, NP       hydrOXYzine  (ATARAX ) tablet 25 mg  25 mg Oral TID PRN Bobbitt, Shalon E, NP       magnesium  hydroxide (MILK OF MAGNESIA) suspension 30 mL  30 mL Oral Daily PRN Bobbitt, Shalon E, NP       [START ON 08/19/2024] paliperidone  (INVEGA ) 24 hr tablet 6 mg  6 mg Oral Daily Kaleya Douse L, PA-C       traZODone  (DESYREL ) tablet 50 mg  50 mg Oral QHS PRN Bobbitt, Shalon E, NP        Lab Results:  No results found for this or any previous visit (from the past 48 hours).   Blood Alcohol level:  Lab Results  Component Value Date   [facility] <15 08/13/2024   ETH <15 01/14/2024    Metabolic Disorder Labs: Lab Results  Component Value Date   HGBA1C 5.0 01/18/2023   MPG 96.8 01/18/2023   MPG 99.67 12/03/2019   No results found for: PROLACTIN Lab Results  Component Value Date   CHOL 159 01/18/2023   TRIG 103 01/18/2023   HDL 57 01/18/2023   CHOLHDL 2.8 01/18/2023   VLDL 21 01/18/2023   LDLCALC 81 01/18/2023   LDLCALC 104 (H) 10/04/2020    Physical Findings: AIMS:  , ,  ,  ,    CIWA:    COWS:       Psychiatric Specialty Exam:  Presentation  General Appearance:  Appropriate for Environment  Eye Contact: Fleeting  Speech: Clear and Coherent  Speech Volume: Normal    Mood and Affect  Mood: Euthymic  Affect: Constricted   Thought Process  Thought Processes: Other (comment) (Thought blocking)  Orientation:Partial (Oriented to person; unable to give date, location, or situation)  Thought Content:Scattered  Hallucinations: Denies  Ideas of Reference:None  Suicidal Thoughts: Denies  Homicidal Thoughts: Denies   Sensorium  Memory: Immediate Fair  Judgment: Impaired  Insight: Lacking   Art Therapist  Concentration: Fair  Attention Span: Fair  Recall: Fair  Fund of Knowledge: Poor  Language: Poor   Psychomotor Activity  Psychomotor Activity: Normal  Musculoskeletal: Strength & Muscle Tone: within normal limits Gait & Station: normal Assets  Assets: Social Support    Physical Exam: Physical Exam ROS Blood pressure 130/88, pulse 75, temperature 97.9 F (36.6 C), resp. rate 18, height 5' 10 (1.778 m), weight 75.8 kg, SpO2 100%. Body mass index is 23.96 kg/m.  Diagnosis: Principal Problem:   Undifferentiated schizophrenia (HCC)   PLAN: Safety and Monitoring:  -- Voluntary admission to inpatient psychiatric unit for safety, stabilization and treatment  -- Daily contact with patient to assess and evaluate symptoms and progress in treatment  -- Patient's case to be discussed in multi-disciplinary team meeting  -- Observation Level : q15 minute checks  -- Vital signs:  q12 hours  -- Precautions: suicide, elopement, and assault -- Encouraged patient to participate in unit milieu and in scheduled group therapies  2. Psychiatric Treatment:  Scheduled Medications: Increase Invega  to 6 mg once daily    -- The risks/benefits/side-effects/alternatives to this medication were discussed in detail with the patient and  time was given for questions. The patient consents to medication trial.  3. Medical Issues Being Addressed:  No acute concerns   4. Discharge Planning:   -- Social work and case  management to assist with discharge planning and identification of hospital follow-up needs prior to discharge  -- Estimated LOS: 5-7 days  Case discussed with supervising physician Dr. Jadapalle, who is in agreement with treatment plan.  Camelia LITTIE Lukes, PA-C 08/18/2024, 11:20 PM

## 2024-08-18 NOTE — Group Note (Signed)
 Recreation Therapy Group Note   Group Topic:Leisure Education  Group Date: 08/18/2024 Start Time: 1120 End Time: 1200 Facilitators: Celestia Jeoffrey BRAVO, LRT, CTRS Location: Craft Room  Group Description: Leisure. Patients were given the option to choose from journaling, coloring, drawing, making origami, playing with playdoh, listening to music or singing karaoke. LRT and pts discussed the meaning of leisure, the importance of participating in leisure during their free time/when they're outside of the hospital, as well as how our leisure interests can also serve as coping skills.   Goal Area(s) Addressed:  Patient will identify a current leisure interest.  Patient will learn the definition of "leisure". Patient will practice making a positive decision. Patient will have the opportunity to try a new leisure activity. Patient will communicate with peers and LRT.    Affect/Mood: N/A   Participation Level: Did not attend    Clinical Observations/Individualized Feedback: Patient did not attend group.   Plan: Continue to engage patient in RT group sessions 2-3x/week.   Jeoffrey BRAVO Celestia, LRT, CTRS 08/18/2024 1:39 PM

## 2024-08-18 NOTE — Group Note (Signed)
 Date:  08/18/2024 Time:  8:30 PM  Group Topic/Focus:  Wrap-Up Group:   The focus of this group is to help patients review their daily goal of treatment and discuss progress on daily workbooks.    Participation Level:  Did Not Attend    Eric Pineda 08/18/2024, 8:30 PM

## 2024-08-18 NOTE — Group Note (Deleted)
 Recreation Therapy Group Note   Group Topic:Leisure Education  Group Date: 08/18/2024 Start Time: 1120 End Time: 1200 Facilitators: Celestia Jeoffrey BRAVO, LRT Location: Craft Room  Group Description: Leisure. Patients were given the option to choose from journaling, coloring, drawing, making origami, playing with playdoh, listening to music or singing karaoke. LRT and pts discussed the meaning of leisure, the importance of participating in leisure during their free time/when they're outside of the hospital, as well as how our leisure interests can also serve as coping skills.   Goal Area(s) Addressed:  Patient will identify a current leisure interest.  Patient will learn the definition of "leisure". Patient will practice making a positive decision. Patient will have the opportunity to try a new leisure activity. Patient will communicate with peers and LRT.      Affect/Mood: {RT BHH Affect/Mood:26271}   Participation Level: {RT BHH Participation Level:26267}   Participation Quality: {RT BHH Participation Quality:26268}   Behavior: {RT BHH Group Behavior:26269}   Speech/Thought Process: {RT BHH Speech/Thought:26276}   Insight: {RT BHH Insight:26272}   Judgement: {RT BHH Judgement:26278}   Modes of Intervention: {RT BHH Modes of Intervention:26277}   Patient Response to Interventions:  {RT BHH Patient Response to Intervention:26274}   Education Outcome:  {RT BHH Education Outcome:26279}   Clinical Observations/Individualized Feedback: *** was *** in their participation of session activities and group discussion. Pt identified ***   Plan: {RT BHH Tx Eojw:73719}   Jeoffrey BRAVO Celestia, LRT,  08/18/2024 1:30 PM

## 2024-08-19 NOTE — Group Note (Signed)
 [facility] LCSW Group Therapy Note   Group Date: 08/19/2024 Start Time: 1330 End Time: 1415   Type of Therapy/Topic:  Group Therapy:  Emotion Regulation  Participation Level:  Did Not Attend   Mood:  Description of Group:    The purpose of this group is to assist patients in learning to regulate negative emotions and experience positive emotions. Patients will be guided to discuss ways in which they have been vulnerable to their negative emotions. These vulnerabilities will be juxtaposed with experiences of positive emotions or situations, and patients challenged to use positive emotions to combat negative ones. Special emphasis will be placed on coping with negative emotions in conflict situations, and patients will process healthy conflict resolution skills.  Therapeutic Goals: Patient will identify two positive emotions or experiences to reflect on in order to balance out negative emotions:  Patient will label two or more emotions that they find the most difficult to experience:  Patient will be able to demonstrate positive conflict resolution skills through discussion or role plays:   Summary of Patient Progress:   Did not attend    Therapeutic Modalities:   Cognitive Behavioral Therapy Feelings Identification Dialectical Behavioral Therapy   Aldo CHRISTELLA Niece, LCSW

## 2024-08-19 NOTE — Plan of Care (Signed)
 Patient was in his room most of the evening. Affect was flat and minimal. Denies SI/HI upon assessment.

## 2024-08-19 NOTE — Plan of Care (Signed)
  Problem: Education: Goal: Emotional status will improve Outcome: Progressing Goal: Mental status will improve Outcome: Progressing Goal: Verbalization of understanding the information provided will improve Outcome: Progressing   Problem: Activity: Goal: Sleeping patterns will improve Outcome: Progressing   Problem: Coping: Goal: Ability to verbalize frustrations and anger appropriately will improve Outcome: Progressing Goal: Ability to demonstrate self-control will improve Outcome: Progressing   Problem: Health Behavior/Discharge Planning: Goal: Compliance with treatment plan for underlying cause of condition will improve Outcome: Progressing   Problem: Activity: Goal: Interest or engagement in activities will improve Outcome: Not Progressing

## 2024-08-19 NOTE — Group Note (Signed)
 Date:  08/19/2024 Time:  8:44 PM  Group Topic/Focus:  Conflict Resolution:   The focus of this group is to discuss the conflict resolution process and how it may be used upon discharge.    Pt did not attend group.   Bryon Parker L 08/19/2024, 8:44 PM

## 2024-08-19 NOTE — Group Note (Signed)
 Date:  08/19/2024 Time:  10:12 AM  Group Topic/Focus:  Coping With Mental Health Crisis:   The purpose of this group is to help patients identify strategies for coping with mental health crisis.  Group discusses possible causes of crisis and ways to manage them effectively. Group also listened to music to prepare ourselves for the day.    Participation Level:  Did Not Attend  Leigh VEAR Pais 08/19/2024, 10:12 AM

## 2024-08-19 NOTE — Progress Notes (Cosign Needed)
 [facility] MD Progress Note  08/19/2024 12:20 PM Eric Pineda  MRN:  [id number]   Subjective:  Chart reviewed, case discussed in multidisciplinary meeting, patient seen during rounds.  11/29: Patient seen on rounds today by psychiatry.  Patient is noted to be calm with psychiatry team but minimally engaged during assessment.  Patient continues to display flat affect and potential thought blocking.  He will only answer this provider's assessment questions with a yes or no answer.  He denied suicidal or homicidal ideations.  He also denied any auditory or visual hallucinations, but would not further elaborate when this provider asked further assessment questions.  It appears he continues to be compliant with current medication recommendations.  Per nursing staff, patient has continued to maintain safe behaviors on the unit.  11/28: On interview today, patient is noted to be calm but continues to be minimally engaged in interview.  He continues to display a flat affect and thought blocking.  He continues to answer questions with yes or no answer without further elaboration.  He denies SI/HI/plan and denies hallucinations.  He reports stable sleep and appetite.  He is agreeable to Invega  dose increase.  He has been medication compliant and has maintained safe behaviors on the unit.  He walks away from provider during interview.  11/27: On interview today patient is noted to be calm and cooperative, engages minimally in interview.  He continues to display flat affect and thought blocking.  He continues to primarily answer questions with yes or no answers.  Per nursing report, patient initially refused Invega  last night but did eventually take it today.  Provider discussed treatment plan with patient, patient denied any questions or concerns regarding medication or treatment plan.  He reports good sleep and appetite.  He denies SI/HI/plan and denies hallucinations.  He is unable to participate in meaningful  conversation.  He does not voice any concerns or complaints at this time.  11/26: On interview today, patient is noted to be calm and cooperative, alert and oriented.  He displays a flat affect.  He is oriented to self but not able to give date, location, or situation.  He does not respond appropriately to questions, often answering with a yes or no answer.  He is unable to discuss what he had for breakfast, stating I don't remember.  He continues to lack insight.  He reports good sleep and appetite.  He denies SI/HI/plan and denies hallucinations.  He does not voice any concerns or complaints at this time.  He reports good mood.  He denies symptoms of depression or anxiety.  He did not require any behavioral PRNs overnight.  He interacts minimally with peers and staff.  He is noted to be pacing the halls of the unit or laying in bed.  He is not observed responding to internal stimuli on exam today, though does continue to display thought blocking. Will repeat CBC due to elevated WBC on initial hospital presentation. Per chart review, patient previously responded well to Invega , will restart.  Patient has repeatedly refused EKG. Patient's most recent EKG from May 2025 showed QTc of 375 with normal sinus rhythm.   Collateral obtained from patient's sister, Eric Pineda.  She reports patient's most recent psychiatric hospitalization was in May 2025.  She reports noncompliance with medication or outpatient follow-up.  She states he has not taken any psychotropic medication since his last hospitalization.  She also reports patient was set up with ACT team but patient refused their visits.  She  reports behavior at home includes pacing, laying in bed for hours, or standing in the same place at home for hours.  She has observed him responding to internal stimuli, talking to himself or laughing inappropriately.  She reports some days where patient is nonverbal, and other days are patient is more interactive.  She  reports patient was previously diagnosed with schizophrenia approximately 2 years ago.  She denies history of developmental or cognitive abnormalities.  She reports history of drug use in patient but is not aware of any recent drug use. She denies any recent episodes of violence.  She denies knowledge of previous suicide attempt or self-harm behaviors.  She reports previous overdose of cocaine at age 65 or 59.  Past Psychiatric History: see h&P Family History: History reviewed. No pertinent family history. Social History:  Social History   Substance and Sexual Activity  Alcohol Use Never     Social History   Substance and Sexual Activity  Drug Use Yes    Social History   Socioeconomic History   Marital status: Single    Spouse name: Not on file   Number of children: Not on file   Years of education: Not on file   Highest education level: Not on file  Occupational History   Not on file  Tobacco Use   Smoking status: Never   Smokeless tobacco: Never   Tobacco comments:    Patient denied current use of tobacco  Vaping Use   Vaping status: Never Used  Substance and Sexual Activity   Alcohol use: Never   Drug use: Yes   Sexual activity: Not on file  Other Topics Concern   Not on file  Social History Narrative   Not on file   Social Drivers of Health   Financial Resource Strain: Not on file  Food Insecurity: Food Insecurity Present (08/14/2024)   Hunger Vital Sign    Worried About Running Out of Food in the Last Year: Sometimes true    Ran Out of Food in the Last Year: Never true  Transportation Needs: No Transportation Needs (08/14/2024)   PRAPARE - Administrator, Civil Service (Medical): No    Lack of Transportation (Non-Medical): No  Physical Activity: Not on file  Stress: Not on file  Social Connections: Unknown (01/17/2024)   Social Connection and Isolation Panel    Frequency of Communication with Friends and Family: Patient declined    Frequency of  Social Gatherings with Friends and Family: Patient declined    Attends Religious Services: Patient declined    Database Administrator or Organizations: Patient declined    Attends Banker Meetings: Patient declined    Marital Status: Never married   Past Medical History:  Past Medical History:  Diagnosis Date   Bizarre behavior 12/02/2019   History reviewed. No pertinent surgical history.  Current Medications: Current Facility-Administered Medications  Medication Dose Route Frequency Provider Last Rate Last Admin   acetaminophen  (TYLENOL ) tablet 650 mg  650 mg Oral Q6H PRN Bobbitt, Shalon E, NP       alum & mag hydroxide-simeth (MAALOX/MYLANTA) 200-200-20 MG/5ML suspension 30 mL  30 mL Oral Q4H PRN Bobbitt, Shalon E, NP       haloperidol  (HALDOL ) tablet 5 mg  5 mg Oral TID PRN Bobbitt, Shalon E, NP       And   diphenhydrAMINE  (BENADRYL ) capsule 50 mg  50 mg Oral TID PRN Bobbitt, Shalon E, NP  haloperidol  lactate (HALDOL ) injection 5 mg  5 mg Intramuscular TID PRN Bobbitt, Shalon E, NP       And   diphenhydrAMINE  (BENADRYL ) injection 50 mg  50 mg Intramuscular TID PRN Bobbitt, Shalon E, NP       And   LORazepam  (ATIVAN ) injection 2 mg  2 mg Intramuscular TID PRN Bobbitt, Shalon E, NP       haloperidol  lactate (HALDOL ) injection 10 mg  10 mg Intramuscular TID PRN Bobbitt, Shalon E, NP       And   diphenhydrAMINE  (BENADRYL ) injection 50 mg  50 mg Intramuscular TID PRN Bobbitt, Shalon E, NP       And   LORazepam  (ATIVAN ) injection 2 mg  2 mg Intramuscular TID PRN Bobbitt, Shalon E, NP       hydrOXYzine  (ATARAX ) tablet 25 mg  25 mg Oral TID PRN Bobbitt, Shalon E, NP       magnesium  hydroxide (MILK OF MAGNESIA) suspension 30 mL  30 mL Oral Daily PRN Bobbitt, Shalon E, NP       paliperidone  (INVEGA ) 24 hr tablet 6 mg  6 mg Oral Daily Hunter, Crystal L, PA-C   6 mg at 08/19/24 0825   traZODone  (DESYREL ) tablet 50 mg  50 mg Oral QHS PRN Bobbitt, Shalon E, NP        Lab  Results:  No results found for this or any previous visit (from the past 48 hours).   Blood Alcohol level:  Lab Results  Component Value Date   [facility] <15 08/13/2024   ETH <15 01/14/2024    Metabolic Disorder Labs: Lab Results  Component Value Date   HGBA1C 5.0 01/18/2023   MPG 96.8 01/18/2023   MPG 99.67 12/03/2019   No results found for: PROLACTIN Lab Results  Component Value Date   CHOL 159 01/18/2023   TRIG 103 01/18/2023   HDL 57 01/18/2023   CHOLHDL 2.8 01/18/2023   VLDL 21 01/18/2023   LDLCALC 81 01/18/2023   LDLCALC 104 (H) 10/04/2020    Physical Findings: AIMS:  , ,  ,  ,    CIWA:    COWS:      Psychiatric Specialty Exam:  Presentation  General Appearance:  Appropriate for Environment  Eye Contact: Fleeting  Speech: Clear and Coherent  Speech Volume: Normal    Mood and Affect  Mood: Euthymic  Affect: Constricted   Thought Process  Thought Processes: Other (comment) (Thought blocking)  Orientation:Partial (Oriented to person; unable to give date, location, or situation)  Thought Content:Scattered  Hallucinations: Denies  Ideas of Reference:None  Suicidal Thoughts: Denies  Homicidal Thoughts: Denies   Sensorium  Memory: Immediate Fair  Judgment: Impaired  Insight: Lacking   Art Therapist  Concentration: Fair  Attention Span: Fair  Recall: Fair  Fund of Knowledge: Poor  Language: Poor   Psychomotor Activity  Psychomotor Activity: Normal  Musculoskeletal: Strength & Muscle Tone: within normal limits Gait & Station: normal Assets  Assets: Social Support    Physical Exam: Physical Exam Neurological:     Mental Status: He is alert and oriented to person, place, and time.    Review of Systems  Respiratory:  Negative for shortness of breath.   Cardiovascular:  Negative for chest pain.  Psychiatric/Behavioral:  Negative for depression and suicidal ideas. The patient is not  nervous/anxious.    Blood pressure 130/88, pulse 75, temperature 97.9 F (36.6 C), resp. rate 18, height 5' 10 (1.778 m), weight 75.8 kg, SpO2 100%.  Body mass index is 23.96 kg/m.  Diagnosis: Principal Problem:   Undifferentiated schizophrenia (HCC)   PLAN: Safety and Monitoring:  -- Voluntary admission to inpatient psychiatric unit for safety, stabilization and treatment  -- Daily contact with patient to assess and evaluate symptoms and progress in treatment  -- Patient's case to be discussed in multi-disciplinary team meeting  -- Observation Level : q15 minute checks  -- Vital signs:  q12 hours  -- Precautions: suicide, elopement, and assault -- Encouraged patient to participate in unit milieu and in scheduled group therapies  2. Psychiatric Treatment:  Scheduled Medications: Continue Invega  to 6 mg once daily  - Possibly Consider LAI if patient agreeable to help promote medication adherence    -- The risks/benefits/side-effects/alternatives to this medication were discussed in detail with the patient and time was given for questions. The patient consents to medication trial.  3. Medical Issues Being Addressed:  No acute concerns   4. Discharge Planning:   -- Social work and case management to assist with discharge planning and identification of hospital follow-up needs prior to discharge  -- Estimated LOS: 5-7 days  Case discussed with supervising physician Dr. Jadapalle, who is in agreement with treatment plan.  Zelda Sharps, NP This note was created using Dragon dictation software. Please excuse any inadvertent transcription errors. Case was discussed with supervising physician Dr. Jadapalle who is agreeable with current plan.

## 2024-08-19 NOTE — Progress Notes (Signed)
   08/19/24 0851  Psych Admission Type (Psych Patients Only)  Admission Status Involuntary  Psychosocial Assessment  Patient Complaints None  Eye Contact Brief  Facial Expression Flat  Affect Blunted  Speech Unremarkable  Interaction Isolative  Motor Activity Other (Comment) (WDL)  Appearance/Hygiene Unremarkable  Behavior Characteristics Guarded  Mood Preoccupied  Aggressive Behavior  Effect No apparent injury  Thought Process  Coherency Blocking  Content Preoccupation  Delusions None reported or observed  Perception WDL  Hallucination None reported or observed  Judgment Poor  Confusion WDL  Danger to Self  Current suicidal ideation? Denies  Self-Injurious Behavior No self-injurious ideation or behavior indicators observed or expressed   Agreement Not to Harm Self Yes  Description of Agreement verbal  Danger to Others  Danger to Others None reported or observed

## 2024-08-19 NOTE — Progress Notes (Signed)
   08/19/24 0026  Psych Admission Type (Psych Patients Only)  Admission Status Involuntary  Psychosocial Assessment  Patient Complaints None  Eye Contact Brief;Avoids;Fair  Facial Expression Flat;Blank  Affect Preoccupied;Sullen;Flat  Speech Logical/coherent  Interaction Avoidant;No initiation;Isolative  Motor Activity Other (Comment) (was in room all this evening)  Appearance/Hygiene Unremarkable  Behavior Characteristics Guarded  Mood Preoccupied  Thought Process  Coherency Other (Comment) (Very minimal speech)  Content Preoccupation  Delusions None reported or observed  Perception WDL  Hallucination None reported or observed  Judgment Poor  Confusion None  Danger to Self  Current suicidal ideation? Denies  Agreement Not to Harm Self Yes  Description of Agreement Verbal  Danger to Others  Danger to Others None reported or observed

## 2024-08-20 MED ORDER — PALIPERIDONE ER 3 MG PO TB24
9.0000 mg | ORAL_TABLET | Freq: Every day | ORAL | Status: DC
  Administered 2024-08-22 – 2024-09-04 (×14): 9 mg via ORAL
  Filled 2024-08-20 (×14): qty 3

## 2024-08-20 NOTE — Progress Notes (Addendum)
 The patient was observed withdrawn and isolated in his room during shift change but was alert and oriented upon awakening. During assessment, the patient denied suicidal ideation, homicidal ideation, thoughts of self-harm, and any auditory or visual hallucinations. He also reported no pain at this time. The patient was reminded about the evening wrap-up group but declined to attend.  When asked to describe his mood, the patient stated, "I do not know, and I don't take any medications." The patient presented with a flat affect and remained withdrawn, with limited interaction with peers and staff.  No issues or unsafe behaviors were noted during this period.   08/20/24 2246  Psych Admission Type (Psych Patients Only)  Admission Status Involuntary  Psychosocial Assessment  Patient Complaints None  Eye Contact Brief  Facial Expression Flat  Affect Flat  Speech Logical/coherent  Interaction Avoidant;Guarded;Isolative  Motor Activity Other (Comment)  Appearance/Hygiene Unremarkable  Behavior Characteristics Guarded  Mood Sad  Thought Process  Coherency Blocking  Content Preoccupation  Delusions None reported or observed  Perception WDL  Hallucination None reported or observed  Judgment Poor  Confusion None  Danger to Self  Current suicidal ideation? Denies  Description of Suicide Plan None  Self-Injurious Behavior No self-injurious ideation or behavior indicators observed or expressed   Agreement Not to Harm Self Yes  Description of Agreement Verbal  Danger to Others  Danger to Others None reported or observed

## 2024-08-20 NOTE — Group Note (Signed)
 Date:  08/20/2024 Time:  3:22 PM  Group Topic/Focus:  Dimensions of Wellness:   The focus of this group is to introduce the topic of wellness and discuss the role each dimension of wellness plays in total health.    Participation Level:  Did Not Attend   [name] [name] Dayona Shaheen 08/20/2024, 3:22 PM

## 2024-08-20 NOTE — Group Note (Signed)
 Date:  08/20/2024 Time:  3:18 PM  Group Topic/Focus:  Healthy Communication:   The focus of this group is to discuss communication, barriers to communication, as well as healthy ways to communicate with others.    Participation Level:  Did Not Attend   [name] [name] Kyria Bumgardner 08/20/2024, 3:18 PM

## 2024-08-20 NOTE — Plan of Care (Signed)

## 2024-08-20 NOTE — Progress Notes (Addendum)
 Patient ID: Eric Pineda, male   DOB: Apr 28, 1996, 28 y.o.   MRN: [id number] [facility] MD Progress Note  08/20/2024 1:12 PM Rishaan Gunner  MRN:  [id number]   Subjective:  Chart reviewed, case discussed in multidisciplinary meeting, patient seen during rounds.   11/30: Patient was seen for psychiatric reassessment this morning; patient was alert and oriented X 3, calm, guarded, and minimally engaged with the psychiatric team. Patient denies all psychiatric symptoms during the assessment, including anxiety, depression, mood or psychotic symptoms. However, patient provides only short, monosyllabic responses to questions with no further elaboration. Patient continues to display flat affect and may remain internally preoccupied with thought blocking present. He reports having a normal appetite and sleeping good at night. Patient denies current suicidal- homicidal ideations or perceptual disturbances today. Patient remains medication compliant with the recommendations discussed with him today to continue to titrate his dose of Invega  to 9 mg daily with the plan to transition to Invega  Sustenna LAI, which he was agreeable to doing so. Patient denies any adverse reactions to the medication at this time. Will order lipid profile and Hgb A1C levels today due to not being ordered on admission and continue to evaluate and monitor patient progression.  11/29: Patient seen on rounds today by psychiatry.  Patient is noted to be calm with psychiatry team but minimally engaged during assessment.  Patient continues to display flat affect and potential thought blocking.  He will only answer this provider's assessment questions with a yes or no answer.  He denied suicidal or homicidal ideations.  He also denied any auditory or visual hallucinations, but would not further elaborate when this provider asked further assessment questions.  It appears he continues to be compliant with current medication recommendations.  Per nursing  staff, patient has continued to maintain safe behaviors on the unit.  11/28: On interview today, patient is noted to be calm but continues to be minimally engaged in interview.  He continues to display a flat affect and thought blocking.  He continues to answer questions with yes or no answer without further elaboration.  He denies SI/HI/plan and denies hallucinations.  He reports stable sleep and appetite.  He is agreeable to Invega  dose increase.  He has been medication compliant and has maintained safe behaviors on the unit.  He walks away from provider during interview.  11/27: On interview today patient is noted to be calm and cooperative, engages minimally in interview.  He continues to display flat affect and thought blocking.  He continues to primarily answer questions with yes or no answers.  Per nursing report, patient initially refused Invega  last night but did eventually take it today.  Provider discussed treatment plan with patient, patient denied any questions or concerns regarding medication or treatment plan.  He reports good sleep and appetite.  He denies SI/HI/plan and denies hallucinations.  He is unable to participate in meaningful conversation.  He does not voice any concerns or complaints at this time.  11/26: On interview today, patient is noted to be calm and cooperative, alert and oriented.  He displays a flat affect.  He is oriented to self but not able to give date, location, or situation.  He does not respond appropriately to questions, often answering with a yes or no answer.  He is unable to discuss what he had for breakfast, stating I don't remember.  He continues to lack insight.  He reports good sleep and appetite.  He denies SI/HI/plan and denies hallucinations.  He  does not voice any concerns or complaints at this time.  He reports good mood.  He denies symptoms of depression or anxiety.  He did not require any behavioral PRNs overnight.  He interacts minimally with peers  and staff.  He is noted to be pacing the halls of the unit or laying in bed.  He is not observed responding to internal stimuli on exam today, though does continue to display thought blocking. Will repeat CBC due to elevated WBC on initial hospital presentation. Per chart review, patient previously responded well to Invega , will restart.  Patient has repeatedly refused EKG. Patient's most recent EKG from May 2025 showed QTc of 375 with normal sinus rhythm.   Collateral obtained from patient's sister, Abner Ardis.  She reports patient's most recent psychiatric hospitalization was in May 2025.  She reports noncompliance with medication or outpatient follow-up.  She states he has not taken any psychotropic medication since his last hospitalization.  She also reports patient was set up with ACT team but patient refused their visits.  She reports behavior at home includes pacing, laying in bed for hours, or standing in the same place at home for hours.  She has observed him responding to internal stimuli, talking to himself or laughing inappropriately.  She reports some days where patient is nonverbal, and other days are patient is more interactive.  She reports patient was previously diagnosed with schizophrenia approximately 2 years ago.  She denies history of developmental or cognitive abnormalities.  She reports history of drug use in patient but is not aware of any recent drug use. She denies any recent episodes of violence.  She denies knowledge of previous suicide attempt or self-harm behaviors.  She reports previous overdose of cocaine at age 68 or 45.  Past Psychiatric History: see h&P Family History: History reviewed. No pertinent family history. Social History:  Social History   Substance and Sexual Activity  Alcohol Use Never     Social History   Substance and Sexual Activity  Drug Use Yes    Social History   Socioeconomic History   Marital status: Single    Spouse name: Not on file    Number of children: Not on file   Years of education: Not on file   Highest education level: Not on file  Occupational History   Not on file  Tobacco Use   Smoking status: Never   Smokeless tobacco: Never   Tobacco comments:    Patient denied current use of tobacco  Vaping Use   Vaping status: Never Used  Substance and Sexual Activity   Alcohol use: Never   Drug use: Yes   Sexual activity: Not on file  Other Topics Concern   Not on file  Social History Narrative   Not on file   Social Drivers of Health   Financial Resource Strain: Not on file  Food Insecurity: Food Insecurity Present (08/14/2024)   Hunger Vital Sign    Worried About Running Out of Food in the Last Year: Sometimes true    Ran Out of Food in the Last Year: Never true  Transportation Needs: No Transportation Needs (08/14/2024)   PRAPARE - Administrator, Civil Service (Medical): No    Lack of Transportation (Non-Medical): No  Physical Activity: Not on file  Stress: Not on file  Social Connections: Unknown (01/17/2024)   Social Connection and Isolation Panel    Frequency of Communication with Friends and Family: Patient declined    Frequency  of Social Gatherings with Friends and Family: Patient declined    Attends Religious Services: Patient declined    Database Administrator or Organizations: Patient declined    Attends Banker Meetings: Patient declined    Marital Status: Never married   Past Medical History:  Past Medical History:  Diagnosis Date   Bizarre behavior 12/02/2019   History reviewed. No pertinent surgical history.  Current Medications: Current Facility-Administered Medications  Medication Dose Route Frequency Provider Last Rate Last Admin   acetaminophen  (TYLENOL ) tablet 650 mg  650 mg Oral Q6H PRN Bobbitt, Shalon E, NP       alum & mag hydroxide-simeth (MAALOX/MYLANTA) 200-200-20 MG/5ML suspension 30 mL  30 mL Oral Q4H PRN Bobbitt, Shalon E, NP       haloperidol   (HALDOL ) tablet 5 mg  5 mg Oral TID PRN Bobbitt, Shalon E, NP       And   diphenhydrAMINE  (BENADRYL ) capsule 50 mg  50 mg Oral TID PRN Bobbitt, Shalon E, NP       haloperidol  lactate (HALDOL ) injection 5 mg  5 mg Intramuscular TID PRN Bobbitt, Shalon E, NP       And   diphenhydrAMINE  (BENADRYL ) injection 50 mg  50 mg Intramuscular TID PRN Bobbitt, Shalon E, NP       And   LORazepam  (ATIVAN ) injection 2 mg  2 mg Intramuscular TID PRN Bobbitt, Shalon E, NP       haloperidol  lactate (HALDOL ) injection 10 mg  10 mg Intramuscular TID PRN Bobbitt, Shalon E, NP       And   diphenhydrAMINE  (BENADRYL ) injection 50 mg  50 mg Intramuscular TID PRN Bobbitt, Shalon E, NP       And   LORazepam  (ATIVAN ) injection 2 mg  2 mg Intramuscular TID PRN Bobbitt, Shalon E, NP       hydrOXYzine  (ATARAX ) tablet 25 mg  25 mg Oral TID PRN Bobbitt, Shalon E, NP       magnesium  hydroxide (MILK OF MAGNESIA) suspension 30 mL  30 mL Oral Daily PRN Bobbitt, Shalon E, NP       [START ON 08/21/2024] paliperidone  (INVEGA ) 24 hr tablet 9 mg  9 mg Oral Daily Jigar Zielke J, NP       traZODone  (DESYREL ) tablet 50 mg  50 mg Oral QHS PRN Bobbitt, Shalon E, NP        Lab Results: No results found for this or any previous visit (from the past 48 hours).  Blood Alcohol level:  Lab Results  Component Value Date   [facility] <15 08/13/2024   ETH <15 01/14/2024    Metabolic Disorder Labs: Lab Results  Component Value Date   HGBA1C 5.0 01/18/2023   MPG 96.8 01/18/2023   MPG 99.67 12/03/2019   No results found for: PROLACTIN Lab Results  Component Value Date   CHOL 159 01/18/2023   TRIG 103 01/18/2023   HDL 57 01/18/2023   CHOLHDL 2.8 01/18/2023   VLDL 21 01/18/2023   LDLCALC 81 01/18/2023   LDLCALC 104 (H) 10/04/2020    Physical Findings: AIMS:  , ,  ,  ,    CIWA:    COWS:      Psychiatric Specialty Exam:  Presentation  General Appearance:  Appropriate for Environment  Eye  Contact: Fleeting  Speech: Clear and Coherent  Speech Volume: Normal    Mood and Affect  Mood: Euthymic  Affect: Constricted   Thought Process  Thought Processes: Other (comment) (Thought  blocking)  Orientation:Partial (Oriented to person; unable to give date, location, or situation)  Thought Content:Scattered  Hallucinations:No data recorded Ideas of Reference:None  Suicidal Thoughts:No data recorded Homicidal Thoughts:No data recorded  Sensorium  Memory: Immediate Fair  Judgment: Impaired  Insight: Lacking   Executive Functions  Concentration: Fair  Attention Span: Fair  Recall: Fair  Fund of Knowledge: Poor  Language: Poor   Psychomotor Activity  Psychomotor Activity:No data recorded Musculoskeletal: Strength & Muscle Tone: within normal limits Gait & Station: normal Assets  Assets: Social Support    Physical Exam: Physical Exam ROS Blood pressure 115/78, pulse 93, temperature 97.9 F (36.6 C), resp. rate 18, height 5' 10 (1.778 m), weight 75.8 kg, SpO2 98%. Body mass index is 23.96 kg/m.  Diagnosis: Principal Problem:   Undifferentiated schizophrenia (HCC)   PLAN: Safety and Monitoring:  -- Voluntary admission to inpatient psychiatric unit for safety, stabilization and treatment  -- Daily contact with patient to assess and evaluate symptoms and progress in treatment  -- Patient's case to be discussed in multi-disciplinary team meeting  -- Observation Level : q15 minute checks  -- Vital signs:  q12 hours  -- Precautions: suicide, elopement, and assault -- Encouraged patient to participate in unit milieu and in scheduled group therapies  2. Psychiatric Treatment:  Scheduled Medications: Increase Invega  to 9 mg once daily  - Possibly Consider LAI if patient agreeable to help promote medication adherence     -- The risks/benefits/side-effects/alternatives to this medication were discussed in detail with the patient and  time was given for questions. The patient consents to medication trial.  3. Medical Issues Being Addressed:  No acute concerns  4. Discharge Planning:   -- Social work and case management to assist with discharge planning and identification of hospital follow-up needs prior to discharge  -- Estimated LOS: 5-7 days  Camelia JINNY Mountain, NP 08/20/2024, 1:12 PM

## 2024-08-20 NOTE — Plan of Care (Signed)
   Problem: Activity: Goal: Interest or engagement in activities will improve Outcome: Progressing Goal: Sleeping patterns will improve Outcome: Progressing

## 2024-08-20 NOTE — Plan of Care (Signed)
   Problem: Education: Goal: Knowledge of [facility] Education information/materials will improve Outcome: Progressing Goal: Emotional status will improve Outcome: Progressing Goal: Mental status will improve Outcome: Progressing

## 2024-08-20 NOTE — Progress Notes (Deleted)
 [facility] MD Progress Note  08/20/2024 12:27 PM Eric Pineda  MRN:  [id number]   Subjective:  Chart reviewed, case discussed in multidisciplinary meeting, patient seen during rounds.  11/30: Patient was seen for psychiatric reassessment this morning; patient was alert and oriented X 3, calm, guarded, and minimally engaged with the psychiatric team. Patient denies all psychiatric symptoms during the assessment, including anxiety, depression, mood or psychotic symptoms. However, patient provides only short, monosyllabic responses to questions with no further elaboration. Patient continues to display flat affect and may remain internally preoccupied with thought blocking present. He reports having a normal appetite and sleeping good at night. Patient denies current suicidal- homicidal ideations or perceptual disturbances today. Patient remains medication compliant with the recommendations discussed with him today to continue to titrate his dose of Invega  to 9 mg daily with the plan to transition to Invega  Sustenna LAI, which he was agreeable to doing so. Patient denies any adverse reactions to the medication at this time. Will order lipid profile and Hgb A1C levels today due to not being ordered on admission and continue to evaluate and monitor patient progression.  11/29: Patient seen on rounds today by psychiatry.  Patient is noted to be calm with psychiatry team but minimally engaged during assessment.  Patient continues to display flat affect and potential thought blocking.  He will only answer this provider's assessment questions with a yes or no answer.  He denied suicidal or homicidal ideations.  He also denied any auditory or visual hallucinations, but would not further elaborate when this provider asked further assessment questions.  It appears he continues to be compliant with current medication recommendations.  Per nursing staff, patient has continued to maintain safe behaviors on the unit.  11/28:  On interview today, patient is noted to be calm but continues to be minimally engaged in interview.  He continues to display a flat affect and thought blocking.  He continues to answer questions with yes or no answer without further elaboration.  He denies SI/HI/plan and denies hallucinations.  He reports stable sleep and appetite.  He is agreeable to Invega  dose increase.  He has been medication compliant and has maintained safe behaviors on the unit.  He walks away from provider during interview.  11/27: On interview today patient is noted to be calm and cooperative, engages minimally in interview.  He continues to display flat affect and thought blocking.  He continues to primarily answer questions with yes or no answers.  Per nursing report, patient initially refused Invega  last night but did eventually take it today.  Provider discussed treatment plan with patient, patient denied any questions or concerns regarding medication or treatment plan.  He reports good sleep and appetite.  He denies SI/HI/plan and denies hallucinations.  He is unable to participate in meaningful conversation.  He does not voice any concerns or complaints at this time.  11/26: On interview today, patient is noted to be calm and cooperative, alert and oriented.  He displays a flat affect.  He is oriented to self but not able to give date, location, or situation.  He does not respond appropriately to questions, often answering with a yes or no answer.  He is unable to discuss what he had for breakfast, stating I don't remember.  He continues to lack insight.  He reports good sleep and appetite.  He denies SI/HI/plan and denies hallucinations.  He does not voice any concerns or complaints at this time.  He reports good mood.  He denies symptoms of depression or anxiety.  He did not require any behavioral PRNs overnight.  He interacts minimally with peers and staff.  He is noted to be pacing the halls of the unit or laying in bed.  He  is not observed responding to internal stimuli on exam today, though does continue to display thought blocking. Will repeat CBC due to elevated WBC on initial hospital presentation. Per chart review, patient previously responded well to Invega , will restart.  Patient has repeatedly refused EKG. Patient's most recent EKG from May 2025 showed QTc of 375 with normal sinus rhythm.   Collateral obtained from patient's sister, Eric Pineda.  She reports patient's most recent psychiatric hospitalization was in May 2025.  She reports noncompliance with medication or outpatient follow-up.  She states he has not taken any psychotropic medication since his last hospitalization.  She also reports patient was set up with ACT team but patient refused their visits.  She reports behavior at home includes pacing, laying in bed for hours, or standing in the same place at home for hours.  She has observed him responding to internal stimuli, talking to himself or laughing inappropriately.  She reports some days where patient is nonverbal, and other days are patient is more interactive.  She reports patient was previously diagnosed with schizophrenia approximately 2 years ago.  She denies history of developmental or cognitive abnormalities.  She reports history of drug use in patient but is not aware of any recent drug use. She denies any recent episodes of violence.  She denies knowledge of previous suicide attempt or self-harm behaviors.  She reports previous overdose of cocaine at age 29 or 11.  Past Psychiatric History: see h&P Family History: History reviewed. No pertinent family history. Social History:  Social History   Substance and Sexual Activity  Alcohol Use Never     Social History   Substance and Sexual Activity  Drug Use Yes    Social History   Socioeconomic History   Marital status: Single    Spouse name: Not on file   Number of children: Not on file   Years of education: Not on file   Highest  education level: Not on file  Occupational History   Not on file  Tobacco Use   Smoking status: Never   Smokeless tobacco: Never   Tobacco comments:    Patient denied current use of tobacco  Vaping Use   Vaping status: Never Used  Substance and Sexual Activity   Alcohol use: Never   Drug use: Yes   Sexual activity: Not on file  Other Topics Concern   Not on file  Social History Narrative   Not on file   Social Drivers of Health   Financial Resource Strain: Not on file  Food Insecurity: Food Insecurity Present (08/14/2024)   Hunger Vital Sign    Worried About Running Out of Food in the Last Year: Sometimes true    Ran Out of Food in the Last Year: Never true  Transportation Needs: No Transportation Needs (08/14/2024)   PRAPARE - Administrator, Civil Service (Medical): No    Lack of Transportation (Non-Medical): No  Physical Activity: Not on file  Stress: Not on file  Social Connections: Unknown (01/17/2024)   Social Connection and Isolation Panel    Frequency of Communication with Friends and Family: Patient declined    Frequency of Social Gatherings with Friends and Family: Patient declined    Attends Religious Services: Patient  declined    Active Member of Clubs or Organizations: Patient declined    Attends Banker Meetings: Patient declined    Marital Status: Never married   Past Medical History:  Past Medical History:  Diagnosis Date   Bizarre behavior 12/02/2019   History reviewed. No pertinent surgical history.  Current Medications: Current Facility-Administered Medications  Medication Dose Route Frequency Provider Last Rate Last Admin   acetaminophen  (TYLENOL ) tablet 650 mg  650 mg Oral Q6H PRN Bobbitt, Shalon E, NP       alum & mag hydroxide-simeth (MAALOX/MYLANTA) 200-200-20 MG/5ML suspension 30 mL  30 mL Oral Q4H PRN Bobbitt, Shalon E, NP       haloperidol  (HALDOL ) tablet 5 mg  5 mg Oral TID PRN Bobbitt, Shalon E, NP       And    diphenhydrAMINE  (BENADRYL ) capsule 50 mg  50 mg Oral TID PRN Bobbitt, Shalon E, NP       haloperidol  lactate (HALDOL ) injection 5 mg  5 mg Intramuscular TID PRN Bobbitt, Shalon E, NP       And   diphenhydrAMINE  (BENADRYL ) injection 50 mg  50 mg Intramuscular TID PRN Bobbitt, Shalon E, NP       And   LORazepam  (ATIVAN ) injection 2 mg  2 mg Intramuscular TID PRN Bobbitt, Shalon E, NP       haloperidol  lactate (HALDOL ) injection 10 mg  10 mg Intramuscular TID PRN Bobbitt, Shalon E, NP       And   diphenhydrAMINE  (BENADRYL ) injection 50 mg  50 mg Intramuscular TID PRN Bobbitt, Shalon E, NP       And   LORazepam  (ATIVAN ) injection 2 mg  2 mg Intramuscular TID PRN Bobbitt, Shalon E, NP       hydrOXYzine  (ATARAX ) tablet 25 mg  25 mg Oral TID PRN Bobbitt, Shalon E, NP       magnesium  hydroxide (MILK OF MAGNESIA) suspension 30 mL  30 mL Oral Daily PRN Bobbitt, Shalon E, NP       paliperidone  (INVEGA ) 24 hr tablet 6 mg  6 mg Oral Daily Hunter, Trinity Hyland L, PA-C   6 mg at 08/20/24 9062   traZODone  (DESYREL ) tablet 50 mg  50 mg Oral QHS PRN Bobbitt, Shalon E, NP        Lab Results:  No results found for this or any previous visit (from the past 48 hours).   Blood Alcohol level:  Lab Results  Component Value Date   [facility] <15 08/13/2024   ETH <15 01/14/2024    Metabolic Disorder Labs: Lab Results  Component Value Date   HGBA1C 5.0 01/18/2023   MPG 96.8 01/18/2023   MPG 99.67 12/03/2019   No results found for: PROLACTIN Lab Results  Component Value Date   CHOL 159 01/18/2023   TRIG 103 01/18/2023   HDL 57 01/18/2023   CHOLHDL 2.8 01/18/2023   VLDL 21 01/18/2023   LDLCALC 81 01/18/2023   LDLCALC 104 (H) 10/04/2020    Physical Findings: AIMS:  , ,  ,  ,    CIWA:    COWS:      Psychiatric Specialty Exam:  Presentation  General Appearance:  Appropriate for Environment  Eye Contact: Fleeting  Speech: Clear and Coherent  Speech Volume: Normal    Mood and Affect   Mood: Euthymic  Affect: Constricted   Thought Process  Thought Processes: Other (comment) (Thought blocking)  Orientation:Partial (Oriented to person; unable to give date, location, or situation)  Thought Content:Scattered  Hallucinations: Denies  Ideas of Reference:None  Suicidal Thoughts: Denies  Homicidal Thoughts: Denies   Sensorium  Memory: Immediate Fair  Judgment: Impaired  Insight: Lacking   Executive Functions  Concentration: Fair  Attention Span: Fair  Recall: Fair  Fund of Knowledge: Poor  Language: Poor   Psychomotor Activity  Psychomotor Activity: Normal  Musculoskeletal: Strength & Muscle Tone: within normal limits Gait & Station: normal Assets  Assets: Social Support    Physical Exam: Physical Exam Neurological:     Mental Status: He is alert and oriented to person, place, and time.    Review of Systems  Respiratory:  Negative for shortness of breath.   Cardiovascular:  Negative for chest pain.  Psychiatric/Behavioral:  Negative for depression and suicidal ideas. The patient is not nervous/anxious.    Blood pressure 115/78, pulse 93, temperature 97.9 F (36.6 C), resp. rate 18, height 5' 10 (1.778 m), weight 75.8 kg, SpO2 98%. Body mass index is 23.96 kg/m.  Diagnosis: Principal Problem:   Undifferentiated schizophrenia (HCC)   PLAN: Safety and Monitoring:  -- Voluntary admission to inpatient psychiatric unit for safety, stabilization and treatment  -- Daily contact with patient to assess and evaluate symptoms and progress in treatment  -- Patient's case to be discussed in multi-disciplinary team meeting  -- Observation Level : q15 minute checks  -- Vital signs:  q12 hours  -- Precautions: suicide, elopement, and assault -- Encouraged patient to participate in unit milieu and in scheduled group therapies  2. Psychiatric Treatment:  Scheduled Medications: Increase Invega  to 9 mg once daily  - Possibly  Consider LAI if patient agreeable to help promote medication adherence    -- The risks/benefits/side-effects/alternatives to this medication were discussed in detail with the patient and time was given for questions. The patient consents to medication trial.  3. Medical Issues Being Addressed:  No acute concerns   4. Discharge Planning:   -- Social work and case management to assist with discharge planning and identification of hospital follow-up needs prior to discharge  -- Estimated LOS: 5-7 days   Camelia Mountain, NP Case was discussed with supervising physician Dr. Jadapalle who is agreeable with current plan.

## 2024-08-21 LAB — LIPID PANEL
Cholesterol: 155 mg/dL (ref 0–200)
HDL: 46 mg/dL (ref >40)
LDL Cholesterol: 95 mg/dL (ref 0–99)
Total CHOL/HDL Ratio: 3.4 ratio
Triglycerides: 73 mg/dL (ref <150)
VLDL: 15 mg/dL (ref 0–40)

## 2024-08-21 LAB — HEMOGLOBIN A1C
Hgb A1c MFr Bld: 5.1 % (ref 4.8–5.6)
Mean Plasma Glucose: 100 mg/dL

## 2024-08-21 NOTE — Group Note (Signed)
 [facility] LCSW Group Therapy Note    Group Date: 08/21/2024 Start Time: 1300 End Time: 1400  Type of Therapy and Topic:  Group Therapy:  Overcoming Obstacles  Participation Level:  [facility] PARTICIPATION LEVEL: Did Not Attend  Description of Group:   In this group patients will be encouraged to explore what they see as obstacles to their own wellness and recovery. They will be guided to discuss their thoughts, feelings, and behaviors related to these obstacles. The group will process together ways to cope with barriers, with attention given to specific choices patients can make. Each patient will be challenged to identify changes they are motivated to make in order to overcome their obstacles. This group will be process-oriented, with patients participating in exploration of their own experiences as well as giving and receiving support and challenge from other group members.  Therapeutic Goals: 1. Patient will identify personal and current obstacles as they relate to admission. 2. Patient will identify barriers that currently interfere with their wellness or overcoming obstacles.  3. Patient will identify feelings, thought process and behaviors related to these barriers. 4. Patient will identify two changes they are willing to make to overcome these obstacles:    Summary of Patient Progress Patient did not attend group.  Therapeutic Modalities:   Cognitive Behavioral Therapy Solution Focused Therapy Motivational Interviewing Relapse Prevention Therapy   Eric JONELLE Fam, LCSW

## 2024-08-21 NOTE — Plan of Care (Signed)
   Problem: Education: Goal: Emotional status will improve Outcome: Progressing Goal: Mental status will improve Outcome: Progressing Goal: Verbalization of understanding the information provided will improve Outcome: Progressing

## 2024-08-21 NOTE — Group Note (Signed)
 Date:  08/21/2024 Time:  10:54 AM  Group Topic/Focus:  Managing Feelings:   The focus of this group is to identify what feelings patients have difficulty handling and develop a plan to handle them in a healthier way upon discharge. Personal Choices and Values:   The focus of this group is to help patients assess and explore the importance of values in their lives, how their values affect their decisions, how they express their values and what opposes their expression. Self Care:   The focus of this group is to help patients understand the importance of self-care in order to improve or restore emotional, physical, spiritual, interpersonal, and financial health.    Participation Level:  Pt Did Not Attend  Participation Quality:  Pt Did Not Attend  Affect:  Pt Did Not Attend  Cognitive:  Pt Did Not Attend Pt Did Not Attend Insight: Pt Did Not Attend  Engagement in Group:  Pt Did Not Attend  Modes of Intervention:  Pt Did Not Attend  Additional Comments:  Pt Did Not Attend  Amirrah Quigley E Sari Cogan 08/21/2024, 10:54 AM

## 2024-08-21 NOTE — Group Note (Signed)
 Recreation Therapy Group Note   Group Topic:Healthy Support Systems  Group Date: 08/21/2024 Start Time: 1010 End Time: 1055 Facilitators: Celestia Jeoffrey BRAVO, LRT, CTRS Location: Craft Room  Group Description: Straw Bridge. In groups or individually, patients were given 10 plastic drinking straws and an equal length of masking tape. Using the materials provided, patients were instructed to build a free-standing bridge-like structure to suspend an everyday item (ex: deck of cards) off the floor or table surface. All materials were required to be used in secondary school teacher. LRT facilitated post-activity discussion reviewing the importance of having strong and healthy support systems in our lives. LRT discussed how the people in our lives serve as the tape and the deck of cards we placed on top of our straw structure are the stressors we face in daily life. LRT and pts discussed what happens in our life when things get too heavy for us , and we don't have strong supports outside of the hospital. Pt shared 2 of their healthy supports in their life aloud in the group.   Goal Area(s) Addressed:  Patient will identify 2 healthy supports in their life. Patient will identify skills to successfully complete activity. Patient will identify correlation of this activity to life post-discharge.  Patient will build on frustration tolerance skills. Patient will increase team building and communication skills.    Affect/Mood: N/A   Participation Level: Did not attend    Clinical Observations/Individualized Feedback: Patient did not attend group.   Plan: Continue to engage patient in RT group sessions 2-3x/week.   Jeoffrey BRAVO Celestia, LRT, CTRS 08/21/2024 1:19 PM

## 2024-08-21 NOTE — Group Note (Signed)
 Recreation Therapy Group Note   Group Topic:Other  Group Date: 08/21/2024 Start Time: 1500 End Time: 1545 Facilitators: Celestia Jeoffrey FORBES ARTICE, CTRS Location: Craft Room  Activity Description/Intervention: Therapeutic Drumming. Patients with peers and staff were given the opportunity to engage in a leader facilitated [facility] Group Empowerment Drumming Circle with staff from the [employer], in partnership with [employer]. Teaching laboratory technician and trained [employer], Norleen Mon leading with LRT observing and documenting intervention and pt response. This evidenced-based practice targets 7 areas of health and wellbeing in the human experience including: stress-reduction, exercise, self-expression, camaraderie/support, nurturing, spirituality, and music-making (leisure).    Goal Area(s) Addresses:  Patient will engage in pro-social way in music group.  Patient will follow directions of drum leader on the first prompt. Patient will demonstrate no behavioral issues during group.  Patient will identify if a reduction in stress level occurs as a result of participation in therapeutic drum circle.   Affect/Mood: N/A   Participation Level: Did not attend    Clinical Observations/Individualized Feedback: Patient did not attend group.   Plan: Continue to engage patient in RT group sessions 2-3x/week.   Jeoffrey FORBES Celestia, LRT, CTRS 08/21/2024 5:35 PM

## 2024-08-21 NOTE — Progress Notes (Signed)
   08/21/24 9061  Psych Admission Type (Psych Patients Only)  Admission Status Involuntary  Psychosocial Assessment  Patient Complaints None  Eye Contact Avoids  Facial Expression Flat  Affect Flat  Speech Logical/coherent  Interaction Avoidant;Isolative  Motor Activity Other (Comment) (WDL)  Appearance/Hygiene Unremarkable  Behavior Characteristics Resistant to care;Guarded  Mood Preoccupied  Thought Process  Coherency Blocking  Content Preoccupation  Delusions None reported or observed  Perception WDL  Hallucination None reported or observed  Judgment Poor  Confusion None  Danger to Self  Current suicidal ideation? Denies  Agreement Not to Harm Self Yes  Description of Agreement Verbal  Danger to Others  Danger to Others None reported or observed

## 2024-08-21 NOTE — Progress Notes (Signed)
 Patient ID: Eric Pineda, male   DOB: [birth year], 28 y.o.   MRN: [id number] [facility] MD Progress Note  08/21/2024 11:32 AM Eric Pineda  MRN:  [id number]   Subjective:  Chart reviewed, case discussed in multidisciplinary meeting, patient seen during rounds.   08/21/2024: Patient seen on rounds today by psychiatry team.  Patient continues to be minimally engaged during assessment.  Patient will only respond yes or no to all assessment questions.  He will not further elaborate on any questions.  Patient continues to display flat affect and appears to be internally preoccupied with thought blocking present.  When asked about sleep and appetite, patient stated yes.  Patient stated no when asked about suicidal or homicidal ideations as well as auditory or visual hallucinations.  Per nursing staff, patient refused medications today.  Up until today, it appears patient has been compliant with current medication regimen.  On exam with this provider, patient denied any current noted side effects.  11/30: Patient was seen for psychiatric reassessment this morning; patient was alert and oriented X 3, calm, guarded, and minimally engaged with the psychiatric team. Patient denies all psychiatric symptoms during the assessment, including anxiety, depression, mood or psychotic symptoms. However, patient provides only short, monosyllabic responses to questions with no further elaboration. Patient continues to display flat affect and may remain internally preoccupied with thought blocking present. He reports having a normal appetite and sleeping good at night. Patient denies current suicidal- homicidal ideations or perceptual disturbances today. Patient remains medication compliant with the recommendations discussed with him today to continue to titrate his dose of Invega  to 9 mg daily with the plan to transition to Invega  Sustenna LAI, which he was agreeable to doing so. Patient denies any adverse reactions to the medication  at this time. Will order lipid profile and Hgb A1C levels today due to not being ordered on admission and continue to evaluate and monitor patient progression.  11/29: Patient seen on rounds today by psychiatry.  Patient is noted to be calm with psychiatry team but minimally engaged during assessment.  Patient continues to display flat affect and potential thought blocking.  He will only answer this provider's assessment questions with a yes or no answer.  He denied suicidal or homicidal ideations.  He also denied any auditory or visual hallucinations, but would not further elaborate when this provider asked further assessment questions.  It appears he continues to be compliant with current medication recommendations.  Per nursing staff, patient has continued to maintain safe behaviors on the unit.  11/28: On interview today, patient is noted to be calm but continues to be minimally engaged in interview.  He continues to display a flat affect and thought blocking.  He continues to answer questions with yes or no answer without further elaboration.  He denies SI/HI/plan and denies hallucinations.  He reports stable sleep and appetite.  He is agreeable to Invega  dose increase.  He has been medication compliant and has maintained safe behaviors on the unit.  He walks away from provider during interview.  11/27: On interview today patient is noted to be calm and cooperative, engages minimally in interview.  He continues to display flat affect and thought blocking.  He continues to primarily answer questions with yes or no answers.  Per nursing report, patient initially refused Invega  last night but did eventually take it today.  Provider discussed treatment plan with patient, patient denied any questions or concerns regarding medication or treatment plan.  He reports good sleep  and appetite.  He denies SI/HI/plan and denies hallucinations.  He is unable to participate in meaningful conversation.  He does not voice  any concerns or complaints at this time.  11/26: On interview today, patient is noted to be calm and cooperative, alert and oriented.  He displays a flat affect.  He is oriented to self but not able to give date, location, or situation.  He does not respond appropriately to questions, often answering with a yes or no answer.  He is unable to discuss what he had for breakfast, stating I don't remember.  He continues to lack insight.  He reports good sleep and appetite.  He denies SI/HI/plan and denies hallucinations.  He does not voice any concerns or complaints at this time.  He reports good mood.  He denies symptoms of depression or anxiety.  He did not require any behavioral PRNs overnight.  He interacts minimally with peers and staff.  He is noted to be pacing the halls of the unit or laying in bed.  He is not observed responding to internal stimuli on exam today, though does continue to display thought blocking. Will repeat CBC due to elevated WBC on initial hospital presentation. Per chart review, patient previously responded well to Invega , will restart.  Patient has repeatedly refused EKG. Patient's most recent EKG from May 2025 showed QTc of 375 with normal sinus rhythm.   Collateral obtained from patient's sister, Clara Smolen.  She reports patient's most recent psychiatric hospitalization was in May 2025.  She reports noncompliance with medication or outpatient follow-up.  She states he has not taken any psychotropic medication since his last hospitalization.  She also reports patient was set up with ACT team but patient refused their visits.  She reports behavior at home includes pacing, laying in bed for hours, or standing in the same place at home for hours.  She has observed him responding to internal stimuli, talking to himself or laughing inappropriately.  She reports some days where patient is nonverbal, and other days are patient is more interactive.  She reports patient was previously  diagnosed with schizophrenia approximately 2 years ago.  She denies history of developmental or cognitive abnormalities.  She reports history of drug use in patient but is not aware of any recent drug use. She denies any recent episodes of violence.  She denies knowledge of previous suicide attempt or self-harm behaviors.  She reports previous overdose of cocaine at age 34 or 69.  Past Psychiatric History: see h&P Family History: History reviewed. No pertinent family history. Social History:  Social History   Substance and Sexual Activity  Alcohol Use Never     Social History   Substance and Sexual Activity  Drug Use Yes    Social History   Socioeconomic History   Marital status: Single    Spouse name: Not on file   Number of children: Not on file   Years of education: Not on file   Highest education level: Not on file  Occupational History   Not on file  Tobacco Use   Smoking status: Never   Smokeless tobacco: Never   Tobacco comments:    Patient denied current use of tobacco  Vaping Use   Vaping status: Never Used  Substance and Sexual Activity   Alcohol use: Never   Drug use: Yes   Sexual activity: Not on file  Other Topics Concern   Not on file  Social History Narrative   Not on file  Social Drivers of Corporate Investment Banker Strain: Not on file  Food Insecurity: Food Insecurity Present (08/14/2024)   Hunger Vital Sign    Worried About Running Out of Food in the Last Year: Sometimes true    Ran Out of Food in the Last Year: Never true  Transportation Needs: No Transportation Needs (08/14/2024)   PRAPARE - Administrator, Civil Service (Medical): No    Lack of Transportation (Non-Medical): No  Physical Activity: Not on file  Stress: Not on file  Social Connections: Unknown (01/17/2024)   Social Connection and Isolation Panel    Frequency of Communication with Friends and Family: Patient declined    Frequency of Social Gatherings with Friends  and Family: Patient declined    Attends Religious Services: Patient declined    Database Administrator or Organizations: Patient declined    Attends Banker Meetings: Patient declined    Marital Status: Never married   Past Medical History:  Past Medical History:  Diagnosis Date   Bizarre behavior 12/02/2019   History reviewed. No pertinent surgical history.  Current Medications: Current Facility-Administered Medications  Medication Dose Route Frequency Provider Last Rate Last Admin   acetaminophen  (TYLENOL ) tablet 650 mg  650 mg Oral Q6H PRN Bobbitt, Shalon E, NP       alum & mag hydroxide-simeth (MAALOX/MYLANTA) 200-200-20 MG/5ML suspension 30 mL  30 mL Oral Q4H PRN Bobbitt, Shalon E, NP       haloperidol  (HALDOL ) tablet 5 mg  5 mg Oral TID PRN Bobbitt, Shalon E, NP       And   diphenhydrAMINE  (BENADRYL ) capsule 50 mg  50 mg Oral TID PRN Bobbitt, Shalon E, NP       haloperidol  lactate (HALDOL ) injection 5 mg  5 mg Intramuscular TID PRN Bobbitt, Shalon E, NP       And   diphenhydrAMINE  (BENADRYL ) injection 50 mg  50 mg Intramuscular TID PRN Bobbitt, Shalon E, NP       And   LORazepam  (ATIVAN ) injection 2 mg  2 mg Intramuscular TID PRN Bobbitt, Shalon E, NP       haloperidol  lactate (HALDOL ) injection 10 mg  10 mg Intramuscular TID PRN Bobbitt, Shalon E, NP       And   diphenhydrAMINE  (BENADRYL ) injection 50 mg  50 mg Intramuscular TID PRN Bobbitt, Shalon E, NP       And   LORazepam  (ATIVAN ) injection 2 mg  2 mg Intramuscular TID PRN Bobbitt, Shalon E, NP       hydrOXYzine  (ATARAX ) tablet 25 mg  25 mg Oral TID PRN Bobbitt, Shalon E, NP       magnesium  hydroxide (MILK OF MAGNESIA) suspension 30 mL  30 mL Oral Daily PRN Bobbitt, Shalon E, NP       paliperidone  (INVEGA ) 24 hr tablet 9 mg  9 mg Oral Daily Montague, Crystal J, NP       traZODone  (DESYREL ) tablet 50 mg  50 mg Oral QHS PRN Bobbitt, Shalon E, NP        Lab Results: No results found for this or any previous  visit (from the past 48 hours).  Blood Alcohol level:  Lab Results  Component Value Date   [facility] <15 08/13/2024   ETH <15 01/14/2024    Metabolic Disorder Labs: Lab Results  Component Value Date   HGBA1C 5.0 01/18/2023   MPG 96.8 01/18/2023   MPG 99.67 12/03/2019   No results found for:  PROLACTIN Lab Results  Component Value Date   CHOL 159 01/18/2023   TRIG 103 01/18/2023   HDL 57 01/18/2023   CHOLHDL 2.8 01/18/2023   VLDL 21 01/18/2023   LDLCALC 81 01/18/2023   LDLCALC 104 (H) 10/04/2020    Physical Findings: AIMS:  , ,  ,  ,    CIWA:    COWS:      Psychiatric Specialty Exam:  Presentation  General Appearance:  Appropriate for Environment  Eye Contact: Fleeting  Speech: Clear and Coherent  Speech Volume: Normal    Mood and Affect  Mood: Euthymic  Affect: Constricted   Thought Process  Thought Processes: Other (comment) (Thought blocking)  Orientation:Partial (Oriented to person; unable to give date, location, or situation)  Thought Content:Scattered  Hallucinations:No data recorded Ideas of Reference:None  Suicidal Thoughts:No data recorded Homicidal Thoughts:No data recorded  Sensorium  Memory: Immediate Fair  Judgment: Impaired  Insight: Lacking   Executive Functions  Concentration: Fair  Attention Span: Fair  Recall: Fair  Fund of Knowledge: Poor  Language: Poor   Psychomotor Activity  Psychomotor Activity:No data recorded Musculoskeletal: Strength & Muscle Tone: within normal limits Gait & Station: normal Assets  Assets: Social Support    Physical Exam: Physical Exam Pulmonary:     Effort: Pulmonary effort is normal.  Neurological:     Mental Status: He is alert and oriented to person, place, and time.    Review of Systems  Respiratory:  Negative for shortness of breath.   Cardiovascular:  Negative for chest pain.  Psychiatric/Behavioral:  Negative for suicidal ideas.    Blood pressure  116/81, pulse 74, temperature 97.9 F (36.6 C), resp. rate 18, height 5' 10 (1.778 m), weight 75.8 kg, SpO2 98%. Body mass index is 23.96 kg/m.  Diagnosis: Principal Problem:   Undifferentiated schizophrenia (HCC)   PLAN: Safety and Monitoring:  -- Voluntary admission to inpatient psychiatric unit for safety, stabilization and treatment  -- Daily contact with patient to assess and evaluate symptoms and progress in treatment  -- Patient's case to be discussed in multi-disciplinary team meeting  -- Observation Level : q15 minute checks  -- Vital signs:  q12 hours  -- Precautions: suicide, elopement, and assault -- Encouraged patient to participate in unit milieu and in scheduled group therapies  2. Psychiatric Treatment:  Scheduled Medications: Increase Invega  to 9 mg once daily -monitor for patient compliance with current regimen - Possibly Consider LAI if patient agreeable to help promote medication adherence     -- The risks/benefits/side-effects/alternatives to this medication were discussed in detail with the patient and time was given for questions. The patient consents to medication trial.  3. Medical Issues Being Addressed:  No acute concerns  4. Discharge Planning:   -- Social work and case management to assist with discharge planning and identification of hospital follow-up needs prior to discharge  -- Estimated LOS: 5-7 days  Zelda Sharps, NP This note was created using [employer]. Please excuse any inadvertent transcription errors. Case was discussed with supervising physician Dr. Jadapalle who is agreeable with current plan.

## 2024-08-21 NOTE — Group Note (Signed)
 Date:  08/21/2024 Time:  2:41 AM    Additional Comments:  Did not attend group.  Eric Pineda 08/21/2024, 2:41 AM

## 2024-08-22 NOTE — Progress Notes (Signed)
   08/22/24 0200  Psych Admission Type (Psych Patients Only)  Admission Status Involuntary  Psychosocial Assessment  Patient Complaints None  Eye Contact Avoids  Facial Expression Flat  Affect Flat  Speech Logical/coherent  Interaction Isolative  Motor Activity Other (Comment)  Appearance/Hygiene Unremarkable  Behavior Characteristics Guarded;Resistant to care  Mood Preoccupied  Aggressive Behavior  Effect No apparent injury  Thought Process  Coherency Blocking  Content Preoccupation  Delusions None reported or observed  Perception WDL  Hallucination None reported or observed  Judgment Poor  Confusion None  Danger to Self  Current suicidal ideation? Denies  Agreement Not to Harm Self Yes  Danger to Others  Danger to Others None reported or observed

## 2024-08-22 NOTE — Group Note (Signed)
 Date:  08/22/2024 Time:  5:20 PM  Group Topic/Focus:  Goals Group:   The focus of this group is to help patients establish daily goals to achieve during treatment and discuss how the patient can incorporate goal setting into their daily lives to aide in recovery.    Participation Level:  Did Not Attend   Eric Pineda 08/22/2024, 5:20 PM

## 2024-08-22 NOTE — Group Note (Signed)
 [facility] LCSW Group Therapy Note   Group Date: 08/22/2024 Start Time: 1300 End Time: 1400   Type of Therapy/Topic:  Group Therapy:  Emotion Regulation  Participation Level:  Did Not Attend   Mood:  Description of Group:    The purpose of this group is to assist patients in learning to regulate negative emotions and experience positive emotions. Patients will be guided to discuss ways in which they have been vulnerable to their negative emotions. These vulnerabilities will be juxtaposed with experiences of positive emotions or situations, and patients challenged to use positive emotions to combat negative ones. Special emphasis will be placed on coping with negative emotions in conflict situations, and patients will process healthy conflict resolution skills.  Therapeutic Goals: Patient will identify two positive emotions or experiences to reflect on in order to balance out negative emotions:  Patient will label two or more emotions that they find the most difficult to experience:  Patient will be able to demonstrate positive conflict resolution skills through discussion or role plays:   Summary of Patient Progress:   Did not attend.     Therapeutic Modalities:   Cognitive Behavioral Therapy Feelings Identification Dialectical Behavioral Therapy   Alveta CHRISTELLA Kerns, LCSW

## 2024-08-22 NOTE — BH IP Treatment Plan (Signed)
 Interdisciplinary Treatment and Diagnostic Plan Update  08/22/2024 Time of Session: 8:00AM Eric Pineda MRN: [id number]  Principal Diagnosis: Undifferentiated schizophrenia [facility])  Secondary Diagnoses: Principal Problem:   Undifferentiated schizophrenia (HCC)   Current Medications:  Current Facility-Administered Medications  Medication Dose Route Frequency Provider Last Rate Last Admin   acetaminophen  (TYLENOL ) tablet 650 mg  650 mg Oral Q6H PRN Bobbitt, Shalon E, NP       alum & mag hydroxide-simeth (MAALOX/MYLANTA) 200-200-20 MG/5ML suspension 30 mL  30 mL Oral Q4H PRN Bobbitt, Shalon E, NP       haloperidol  (HALDOL ) tablet 5 mg  5 mg Oral TID PRN Bobbitt, Shalon E, NP       And   diphenhydrAMINE  (BENADRYL ) capsule 50 mg  50 mg Oral TID PRN Bobbitt, Shalon E, NP       haloperidol  lactate (HALDOL ) injection 5 mg  5 mg Intramuscular TID PRN Bobbitt, Shalon E, NP       And   diphenhydrAMINE  (BENADRYL ) injection 50 mg  50 mg Intramuscular TID PRN Bobbitt, Shalon E, NP       And   LORazepam  (ATIVAN ) injection 2 mg  2 mg Intramuscular TID PRN Bobbitt, Shalon E, NP       haloperidol  lactate (HALDOL ) injection 10 mg  10 mg Intramuscular TID PRN Bobbitt, Shalon E, NP       And   diphenhydrAMINE  (BENADRYL ) injection 50 mg  50 mg Intramuscular TID PRN Bobbitt, Shalon E, NP       And   LORazepam  (ATIVAN ) injection 2 mg  2 mg Intramuscular TID PRN Bobbitt, Shalon E, NP       hydrOXYzine  (ATARAX ) tablet 25 mg  25 mg Oral TID PRN Bobbitt, Shalon E, NP       magnesium  hydroxide (MILK OF MAGNESIA) suspension 30 mL  30 mL Oral Daily PRN Bobbitt, Shalon E, NP       paliperidone  (INVEGA ) 24 hr tablet 9 mg  9 mg Oral Daily Montague, Crystal J, NP   9 mg at 08/22/24 9178   traZODone  (DESYREL ) tablet 50 mg  50 mg Oral QHS PRN Bobbitt, Shalon E, NP   50 mg at 08/21/24 2119   PTA Medications: Medications Prior to Admission  Medication Sig Dispense Refill Last Dose/Taking   benztropine  (COGENTIN ) 0.5 MG  tablet Take 1 tablet (0.5 mg total) by mouth daily. (Patient not taking: Reported on 08/14/2024) 30 tablet 0    paliperidone  (INVEGA  SUSTENNA) 234 MG/1.5ML injection Inject 234 mg into the muscle every 28 (twenty-eight) days. Next dose due q28 days, 02/24/2024 (Patient not taking: Reported on 08/14/2024) 1.5 mL 0     Patient Stressors: Health problems   Medication change or noncompliance    Patient Strengths: Motivation for treatment/growth  Physical Health  Supportive family/friends   Treatment Modalities: Medication Management, Group therapy, Case management,  1 to 1 session with clinician, Psychoeducation, Recreational therapy.   Physician Treatment Plan for Primary Diagnosis: Undifferentiated schizophrenia (HCC) Long Term Goal(s): Improvement in symptoms so as ready for discharge   Short Term Goals: Ability to identify changes in lifestyle to reduce recurrence of condition will improve Ability to verbalize feelings will improve Ability to maintain clinical measurements within normal limits will improve Compliance with prescribed medications will improve  Medication Management: Evaluate patient's response, side effects, and tolerance of medication regimen.  Therapeutic Interventions: 1 to 1 sessions, Unit Group sessions and Medication administration.  Evaluation of Outcomes: Not Progressing  Physician Treatment Plan for Secondary Diagnosis:  Principal Problem:   Undifferentiated schizophrenia (HCC)  Long Term Goal(s): Improvement in symptoms so as ready for discharge   Short Term Goals: Ability to identify changes in lifestyle to reduce recurrence of condition will improve Ability to verbalize feelings will improve Ability to maintain clinical measurements within normal limits will improve Compliance with prescribed medications will improve     Medication Management: Evaluate patient's response, side effects, and tolerance of medication regimen.  Therapeutic Interventions: 1  to 1 sessions, Unit Group sessions and Medication administration.  Evaluation of Outcomes: Not Progressing   RN Treatment Plan for Primary Diagnosis: Undifferentiated schizophrenia (HCC) Long Term Goal(s): Knowledge of disease and therapeutic regimen to maintain health will improve  Short Term Goals: Ability to demonstrate self-control, Ability to participate in decision making will improve, Ability to verbalize feelings will improve, Ability to disclose and discuss suicidal ideas, Ability to identify and develop effective coping behaviors will improve, and Compliance with prescribed medications will improve  Medication Management: RN will administer medications as ordered by provider, will assess and evaluate patient's response and provide education to patient for prescribed medication. RN will report any adverse and/or side effects to prescribing provider.  Therapeutic Interventions: 1 on 1 counseling sessions, Psychoeducation, Medication administration, Evaluate responses to treatment, Monitor vital signs and CBGs as ordered, Perform/monitor CIWA, COWS, AIMS and Fall Risk screenings as ordered, Perform wound care treatments as ordered.  Evaluation of Outcomes: Not Progressing   LCSW Treatment Plan for Primary Diagnosis: Undifferentiated schizophrenia (HCC) Long Term Goal(s): Safe transition to appropriate next level of care at discharge, Engage patient in therapeutic group addressing interpersonal concerns.  Short Term Goals: Engage patient in aftercare planning with referrals and resources, Increase social support, Increase ability to appropriately verbalize feelings, Increase emotional regulation, Facilitate acceptance of mental health diagnosis and concerns, and Increase skills for wellness and recovery  Therapeutic Interventions: Assess for all discharge needs, 1 to 1 time with Social worker, Explore available resources and support systems, Assess for adequacy in community support  network, Educate family and significant other(s) on suicide prevention, Complete Psychosocial Assessment, Interpersonal group therapy.  Evaluation of Outcomes: Not Progressing   Progress in Treatment: Attending groups: No. Participating in groups: No. Taking medication as prescribed: No. Toleration medication: No. Family/Significant other contact made: Yes, individual(s) contacted:  SPE completed with the patient's mother.  Patient understands diagnosis: No. Discussing patient identified problems/goals with staff: No. Medical problems stabilized or resolved: Yes. Denies suicidal/homicidal ideation: Yes. Issues/concerns per patient self-inventory: No. Other: none  New problem(s) identified: No, Describe:  None  Update 08/21/2024: No changes at this time.    New Short Term/Long Term Goal(s):detox, elimination of symptoms of psychosis, medication management for mood stabilization; elimination of SI thoughts; development of comprehensive mental wellness/sobriety plan.  Update 08/21/2024: No changes at this time.    Patient Goals:  Patient declined.  Update 08/21/2024: No changes at this time.    Discharge Plan or Barriers: CSW to assist with the development of appropriate discharge plan.  Update 08/21/2024: Patient continues to decline referrals for aftecare following his admission.  Patient's mother reports that the patient can return to the home and she will provide transportation.    Reason for Continuation of Hospitalization: Anxiety Delusions  Depression Hallucinations Suicidal ideation   Estimated Length of Stay: 1-7 days.  Update 08/21/2024: TBD   Last 3 [place] Suicide Severity Risk Score: Flowsheet Row Admission (Current) from 08/14/2024 in [facility]AL [facility]ICINE ED from 08/13/2024 in [facility] Emergency Department  at [facility] Admission (Discharged) from 01/17/2024 in [facility] INPATIENT BEHAVIORAL MEDICINE  C-SSRS RISK CATEGORY No Risk No Risk No Risk    Last  PHQ 2/9 Scores:     No data to display          Scribe for Treatment Team: Sherryle JINNY Paola KEN 08/22/2024 8:33 AM

## 2024-08-22 NOTE — Plan of Care (Signed)

## 2024-08-22 NOTE — Group Note (Signed)
 Date:  08/22/2024 Time:  9:23 PM    Additional Comments:  Did not attend group.  Eric Pineda 08/22/2024, 9:23 PM

## 2024-08-22 NOTE — Progress Notes (Signed)
   08/22/24 0920  Psych Admission Type (Psych Patients Only)  Admission Status Involuntary  Psychosocial Assessment  Patient Complaints None  Eye Contact Poor  Facial Expression Flat  Affect Flat  Speech Soft  Interaction Isolative  Motor Activity Slow  Appearance/Hygiene Unremarkable  Behavior Characteristics Calm;Guarded  Mood Suspicious  Thought Process  Coherency WDL  Content Preoccupation  Delusions None reported or observed  Perception WDL  Hallucination None reported or observed  Judgment Poor  Confusion None  Danger to Self  Current suicidal ideation? Denies  Self-Injurious Behavior No self-injurious ideation or behavior indicators observed or expressed   Agreement Not to Harm Self Yes  Description of Agreement Verbal  Danger to Others  Danger to Others None reported or observed

## 2024-08-22 NOTE — Group Note (Signed)
 Recreation Therapy Group Note   Group Topic:Goal Setting  Group Date: 08/22/2024 Start Time: 1005 End Time: 1055 Facilitators: Celestia Jeoffrey BRAVO, LRT, CTRS Location: Craft Room  Group Description: Product/process Development Scientist. Patients were given many different magazines, a glue stick, markers, and a piece of cardstock paper. LRT and pts discussed the importance of having goals in life. LRT and pts discussed the difference between short-term and long-term goals, as well as what a SMART goal is. LRT encouraged pts to create a vision board, with images they picked and then cut out with safety scissors from the magazine, for themselves, that capture their short and long-term goals. LRT encouraged pts to show and explain their vision board to the group.   Goal Area(s) Addressed:  Patient will gain knowledge of short vs. long term goals.  Patient will identify goals for themselves. Patient will practice setting SMART goals. Patient will verbalize their goals to LRT and peers.   Affect/Mood: N/A   Participation Level: Did not attend    Clinical Observations/Individualized Feedback: Patient did not attend group.   Plan: Continue to engage patient in RT group sessions 2-3x/week.   Jeoffrey BRAVO Celestia, LRT, CTRS 08/22/2024 11:26 AM

## 2024-08-22 NOTE — Progress Notes (Incomplete)
 Patient ID: Eric Pineda, male   DOB: Mar 18, 1996, 28 y.o.   MRN: [id number] [facility] MD Progress Note  08/22/2024 11:12 AM Eric Pineda  MRN:  [id number]   Subjective:  Chart reviewed, case discussed in multidisciplinary meeting, patient seen during rounds.   12/2: On interview today, patient is noted to be calm and cooperative, alert and oriented only to person.  Patient refused Invega  yesterday but took the medication this morning.  He reports good sleep and appetite.  He denies current symptoms of depression or anxiety.  He denies SI/HI/plan and denies hallucinations.  He continues to be minimal on interview.  He does not voice any concerns or complaints at this time.  Patient is noted to engage minimally with staff and peers.  He continues to pace hallways or lay in bed for extended periods of time.  08/21/2024: Patient seen on rounds today by psychiatry team.  Patient continues to be minimally engaged during assessment.  Patient will only respond yes or no to all assessment questions.  He will not further elaborate on any questions.  Patient continues to display flat affect and appears to be internally preoccupied with thought blocking present.  When asked about sleep and appetite, patient stated yes.  Patient stated no when asked about suicidal or homicidal ideations as well as auditory or visual hallucinations.  Per nursing staff, patient refused medications today.  Up until today, it appears patient has been compliant with current medication regimen.  On exam with this provider, patient denied any current noted side effects.  11/30: Patient was seen for psychiatric reassessment this morning; patient was alert and oriented X 3, calm, guarded, and minimally engaged with the psychiatric team. Patient denies all psychiatric symptoms during the assessment, including anxiety, depression, mood or psychotic symptoms. However, patient provides only short, monosyllabic responses to questions with no further  elaboration. Patient continues to display flat affect and may remain internally preoccupied with thought blocking present. He reports having a normal appetite and sleeping good at night. Patient denies current suicidal- homicidal ideations or perceptual disturbances today. Patient remains medication compliant with the recommendations discussed with him today to continue to titrate his dose of Invega  to 9 mg daily with the plan to transition to Invega  Sustenna LAI, which he was agreeable to doing so. Patient denies any adverse reactions to the medication at this time. Will order lipid profile and Hgb A1C levels today due to not being ordered on admission and continue to evaluate and monitor patient progression.  11/29: Patient seen on rounds today by psychiatry.  Patient is noted to be calm with psychiatry team but minimally engaged during assessment.  Patient continues to display flat affect and potential thought blocking.  He will only answer this provider's assessment questions with a yes or no answer.  He denied suicidal or homicidal ideations.  He also denied any auditory or visual hallucinations, but would not further elaborate when this provider asked further assessment questions.  It appears he continues to be compliant with current medication recommendations.  Per nursing staff, patient has continued to maintain safe behaviors on the unit.  11/28: On interview today, patient is noted to be calm but continues to be minimally engaged in interview.  He continues to display a flat affect and thought blocking.  He continues to answer questions with yes or no answer without further elaboration.  He denies SI/HI/plan and denies hallucinations.  He reports stable sleep and appetite.  He is agreeable to Invega  dose increase.  He has been medication compliant and has maintained safe behaviors on the unit.  He walks away from provider during interview.  11/27: On interview today patient is noted to be calm and  cooperative, engages minimally in interview.  He continues to display flat affect and thought blocking.  He continues to primarily answer questions with yes or no answers.  Per nursing report, patient initially refused Invega  last night but did eventually take it today.  Provider discussed treatment plan with patient, patient denied any questions or concerns regarding medication or treatment plan.  He reports good sleep and appetite.  He denies SI/HI/plan and denies hallucinations.  He is unable to participate in meaningful conversation.  He does not voice any concerns or complaints at this time.  11/26: On interview today, patient is noted to be calm and cooperative, alert and oriented.  He displays a flat affect.  He is oriented to self but not able to give date, location, or situation.  He does not respond appropriately to questions, often answering with a yes or no answer.  He is unable to discuss what he had for breakfast, stating I don't remember.  He continues to lack insight.  He reports good sleep and appetite.  He denies SI/HI/plan and denies hallucinations.  He does not voice any concerns or complaints at this time.  He reports good mood.  He denies symptoms of depression or anxiety.  He did not require any behavioral PRNs overnight.  He interacts minimally with peers and staff.  He is noted to be pacing the halls of the unit or laying in bed.  He is not observed responding to internal stimuli on exam today, though does continue to display thought blocking. Will repeat CBC due to elevated WBC on initial hospital presentation. Per chart review, patient previously responded well to Invega , will restart.  Patient has repeatedly refused EKG. Patient's most recent EKG from May 2025 showed QTc of 375 with normal sinus rhythm.   Collateral obtained from patient's sister, Draysen Weygandt.  She reports patient's most recent psychiatric hospitalization was in May 2025.  She reports noncompliance with medication  or outpatient follow-up.  She states he has not taken any psychotropic medication since his last hospitalization.  She also reports patient was set up with ACT team but patient refused their visits.  She reports behavior at home includes pacing, laying in bed for hours, or standing in the same place at home for hours.  She has observed him responding to internal stimuli, talking to himself or laughing inappropriately.  She reports some days where patient is nonverbal, and other days are patient is more interactive.  She reports patient was previously diagnosed with schizophrenia approximately 2 years ago.  She denies history of developmental or cognitive abnormalities.  She reports history of drug use in patient but is not aware of any recent drug use. She denies any recent episodes of violence.  She denies knowledge of previous suicide attempt or self-harm behaviors.  She reports previous overdose of cocaine at age 24 or 89.  Past Psychiatric History: see h&P Family History: History reviewed. No pertinent family history. Social History:  Social History   Substance and Sexual Activity  Alcohol Use Never     Social History   Substance and Sexual Activity  Drug Use Yes    Social History   Socioeconomic History   Marital status: Single    Spouse name: Not on file   Number of children: Not on file  Years of education: Not on file   Highest education level: Not on file  Occupational History   Not on file  Tobacco Use   Smoking status: Never   Smokeless tobacco: Never   Tobacco comments:    Patient denied current use of tobacco  Vaping Use   Vaping status: Never Used  Substance and Sexual Activity   Alcohol use: Never   Drug use: Yes   Sexual activity: Not on file  Other Topics Concern   Not on file  Social History Narrative   Not on file   Social Drivers of Health   Financial Resource Strain: Not on file  Food Insecurity: Food Insecurity Present (08/14/2024)   Hunger Vital  Sign    Worried About Running Out of Food in the Last Year: Sometimes true    Ran Out of Food in the Last Year: Never true  Transportation Needs: No Transportation Needs (08/14/2024)   PRAPARE - Administrator, Civil Service (Medical): No    Lack of Transportation (Non-Medical): No  Physical Activity: Not on file  Stress: Not on file  Social Connections: Unknown (01/17/2024)   Social Connection and Isolation Panel    Frequency of Communication with Friends and Family: Patient declined    Frequency of Social Gatherings with Friends and Family: Patient declined    Attends Religious Services: Patient declined    Database Administrator or Organizations: Patient declined    Attends Banker Meetings: Patient declined    Marital Status: Never married   Past Medical History:  Past Medical History:  Diagnosis Date   Bizarre behavior 12/02/2019   History reviewed. No pertinent surgical history.  Current Medications: Current Facility-Administered Medications  Medication Dose Route Frequency Provider Last Rate Last Admin   acetaminophen  (TYLENOL ) tablet 650 mg  650 mg Oral Q6H PRN Bobbitt, Shalon E, NP       alum & mag hydroxide-simeth (MAALOX/MYLANTA) 200-200-20 MG/5ML suspension 30 mL  30 mL Oral Q4H PRN Bobbitt, Shalon E, NP       haloperidol  (HALDOL ) tablet 5 mg  5 mg Oral TID PRN Bobbitt, Shalon E, NP       And   diphenhydrAMINE  (BENADRYL ) capsule 50 mg  50 mg Oral TID PRN Bobbitt, Shalon E, NP       haloperidol  lactate (HALDOL ) injection 5 mg  5 mg Intramuscular TID PRN Bobbitt, Shalon E, NP       And   diphenhydrAMINE  (BENADRYL ) injection 50 mg  50 mg Intramuscular TID PRN Bobbitt, Shalon E, NP       And   LORazepam  (ATIVAN ) injection 2 mg  2 mg Intramuscular TID PRN Bobbitt, Shalon E, NP       haloperidol  lactate (HALDOL ) injection 10 mg  10 mg Intramuscular TID PRN Bobbitt, Shalon E, NP       And   diphenhydrAMINE  (BENADRYL ) injection 50 mg  50 mg  Intramuscular TID PRN Bobbitt, Shalon E, NP       And   LORazepam  (ATIVAN ) injection 2 mg  2 mg Intramuscular TID PRN Bobbitt, Shalon E, NP       hydrOXYzine  (ATARAX ) tablet 25 mg  25 mg Oral TID PRN Bobbitt, Shalon E, NP       magnesium  hydroxide (MILK OF MAGNESIA) suspension 30 mL  30 mL Oral Daily PRN Bobbitt, Shalon E, NP       paliperidone  (INVEGA ) 24 hr tablet 9 mg  9 mg Oral Daily Montague, Hallee Mckenny J,  NP   9 mg at 08/22/24 9178   traZODone  (DESYREL ) tablet 50 mg  50 mg Oral QHS PRN Bobbitt, Shalon E, NP   50 mg at 08/21/24 2119    Lab Results:  Results for orders placed or performed during the hospital encounter of 08/14/24 (from the past 48 hours)  Lipid panel     Status: None   Collection Time: 08/21/24 11:30 AM  Result Value Ref Range   Cholesterol 155 0 - 200 mg/dL    Comment:        ATP III CLASSIFICATION:  <200     mg/dL   Desirable  799-760  mg/dL   Borderline High  >=759    mg/dL   High           Triglycerides 73 <150 mg/dL   HDL 46 >59 mg/dL   Total CHOL/HDL Ratio 3.4 RATIO   VLDL 15 0 - 40 mg/dL   LDL Cholesterol 95 0 - 99 mg/dL    Comment:        Total Cholesterol/HDL:CHD Risk Coronary Heart Disease Risk Table                     Men   Women  1/2 Average Risk   3.4   3.3  Average Risk       5.0   4.4  2 X Average Risk   9.6   7.1  3 X Average Risk  23.4   11.0        Use the calculated Patient Ratio above and the CHD Risk Table to determine the patient's CHD Risk.        ATP III CLASSIFICATION (LDL):  <100     mg/dL   Optimal  899-870  mg/dL   Near or Above                    Optimal  130-159  mg/dL   Borderline  839-810  mg/dL   High  >809     mg/dL   Very High Performed at [facility], [address] [place], [place] [address]   Hemoglobin A1c     Status: None   Collection Time: 08/21/24 11:30 AM  Result Value Ref Range   Hgb A1c MFr Bld 5.1 4.8 - 5.6 %    Comment: (NOTE)         Prediabetes: 5.7 - 6.4         Diabetes: >6.4          Glycemic control for adults with diabetes: <7.0    Mean Plasma Glucose 100 mg/dL    Comment: (NOTE) Performed At: [facility] [address] [place], [place] [id number] Jennette Shorter MD Ey:[id number]     Blood Alcohol level:  Lab Results  Component Value Date   [facility] <15 08/13/2024   ETH <15 01/14/2024    Metabolic Disorder Labs: Lab Results  Component Value Date   HGBA1C 5.1 08/21/2024   MPG 100 08/21/2024   MPG 96.8 01/18/2023   No results found for: PROLACTIN Lab Results  Component Value Date   CHOL 155 08/21/2024   TRIG 73 08/21/2024   HDL 46 08/21/2024   CHOLHDL 3.4 08/21/2024   VLDL 15 08/21/2024   LDLCALC 95 08/21/2024   LDLCALC 81 01/18/2023    Physical Findings: AIMS:  , ,  ,  ,    CIWA:    COWS:      Psychiatric Specialty Exam:  Presentation  General Appearance:  Appropriate for Environment  Eye Contact: Fleeting  Speech: Clear and Coherent  Speech Volume: Normal    Mood and Affect  Mood: Euthymic  Affect: Constricted   Thought Process  Thought Processes: Other (comment) (Thought blocking)  Orientation:Partial (Oriented to person; unable to give date, location, or situation)  Thought Content:Scattered  Hallucinations: Denies Ideas of Reference:None  Suicidal Thoughts: Denies Homicidal Thoughts: Denies  Sensorium  Memory: Immediate Fair  Judgment: Impaired  Insight: Lacking   Executive Functions  Concentration: Fair  Attention Span: Fair  Recall: Fair  Fund of Knowledge: Poor  Language: Poor   Psychomotor Activity  Psychomotor Activity: Normal Musculoskeletal: Strength & Muscle Tone: within normal limits Gait & Station: normal Assets  Assets: Social Support    Physical Exam: Physical Exam Pulmonary:     Effort: Pulmonary effort is normal.  Neurological:     Mental Status: He is alert.    Review of Systems  Respiratory:  Negative for shortness of breath.    Cardiovascular:  Negative for chest pain.  Psychiatric/Behavioral:  Negative for suicidal ideas.    Blood pressure 96/65, pulse 60, temperature 97.6 F (36.4 C), temperature source Oral, resp. rate 20, height 5' 10 (1.778 m), weight 75.8 kg, SpO2 96%. Body mass index is 23.96 kg/m.  Diagnosis: Principal Problem:   Undifferentiated schizophrenia (HCC)   PLAN: Safety and Monitoring:  -- Voluntary admission to inpatient psychiatric unit for safety, stabilization and treatment  -- Daily contact with patient to assess and evaluate symptoms and progress in treatment  -- Patient's case to be discussed in multi-disciplinary team meeting  -- Observation Level : q15 minute checks  -- Vital signs:  q12 hours  -- Precautions: suicide, elopement, and assault -- Encouraged patient to participate in unit milieu and in scheduled group therapies  2. Psychiatric Treatment:  Scheduled Medications: Continue Invega  9 mg once daily -monitor for patient compliance with current regimen - Possibly Consider LAI if patient agreeable to help promote medication adherence     -- The risks/benefits/side-effects/alternatives to this medication were discussed in detail with the patient and time was given for questions. The patient consents to medication trial.  3. Medical Issues Being Addressed:  No acute concerns  4. Discharge Planning:   -- Social work and case management to assist with discharge planning and identification of hospital follow-up needs prior to discharge  -- Estimated LOS: Unable to estimate at this time  [employer], PA-C This note was created using Scientist, clinical (histocompatibility and immunogenetics). Please excuse any inadvertent transcription errors. Case was discussed with supervising physician Dr. Jadapalle who is agreeable with current plan.

## 2024-08-22 NOTE — Group Note (Signed)
 Recreation Therapy Group Note   Group Topic:General Recreation  Group Date: 08/22/2024 Start Time: 1530 End Time: 1610 Facilitators: Celestia Jeoffrey BRAVO, LRT, CTRS Location: Courtyard  Group Description: [employer]. LRT and patients played games of basketball, drew with chalk, and played corn hole while outside in the courtyard while getting fresh air and sunlight. Music was being played in the background. LRT and peers conversed about different games they have played before, what they do in their free time and anything else that is on their minds. LRT encouraged pts to drink water after being outside, sweating and getting their heart rate up.  Goal Area(s) Addressed: Patient will build on frustration tolerance skills. Patients will partake in a competitive play game with peers. Patients will gain knowledge of new leisure interest/hobby.    Affect/Mood: N/A   Participation Level: Did not attend    Clinical Observations/Individualized Feedback: Patient did not attend group.   Plan: Continue to engage patient in RT group sessions 2-3x/week.   Jeoffrey BRAVO Celestia, LRT, CTRS 08/22/2024 5:32 PM

## 2024-08-22 NOTE — Plan of Care (Signed)
   Problem: Education: Goal: Knowledge of [facility] Education information/materials will improve Outcome: Progressing Goal: Emotional status will improve Outcome: Progressing Goal: Mental status will improve Outcome: Progressing Goal: Verbalization of understanding the information provided will improve Outcome: Progressing   Problem: Activity: Goal: Interest or engagement in activities will improve Outcome: Progressing

## 2024-08-23 NOTE — Group Note (Signed)
 LCSW Group Therapy Note   Group Date: 08/23/2024 Start Time: 1300 End Time: 1400   Type of Therapy and Topic: Group Therapy: Worry and Anxiety  Participation Level: [facility] PARTICIPATION LEVEL: Did Not Attend  Description of Group: In this group, patients will be encouraged to explore their worry around what could happen vs what will happen. Each patient will be challenged to think of personal worries and how they will work their way through that worry around what will happen and what could happen. This group will be process-oriented, with patients participating in exploration of their own experiences as well as giving and receiving support and challenge from other group members.  Therapeutic Goals: Patient will identify personal worries that cause anxiety. Patient will identify clues to identify their worry. Patient will identify ways to handle their worry. Patient will discuss ways that their worry has deceased or why it has not decreased.   Summary of Patient Progress: X   Therapeutic Modalities: Cognitive Behavioral Therapy Solution Focused Therapy Motivational Interviewing  Sherryle JINNY Margo, LCSW 08/23/2024  3:07 PM

## 2024-08-23 NOTE — Group Note (Signed)
 Date:  08/23/2024 Time:  11:20 PM  Group Topic/Focus:  Wellness Toolbox:   The focus of this group is to discuss various aspects of wellness, balancing those aspects and exploring ways to increase the ability to experience wellness.  Patients will create a wellness toolbox for use upon discharge.    Participation Level:  Did Not Attend  Participation Quality:  none  Affect:  none  Cognitive:  none  Insight: None  Engagement in Group:  none  Modes of Intervention:  none  Additional Comments:     Kerri Katz 08/23/2024, 11:20 PM

## 2024-08-23 NOTE — Plan of Care (Signed)

## 2024-08-23 NOTE — Progress Notes (Signed)
 Patient ID: Eric Pineda, male   DOB: Jan 22, 1996, 28 y.o.   MRN: [id number] [facility] MD Progress Note  08/23/2024 9:33 PM Eric Pineda  MRN:  [id number]   Subjective:  Chart reviewed, case discussed in multidisciplinary meeting, patient seen during rounds.   12/3: On interview today, patient is noted to be calm and cooperative, alert, and oriented only to person.  He continues to engage minimally in interview.  He was compliant with medication this morning.  He denies current symptoms of depression or anxiety.  He denies SI/HI/plan and denies hallucinations.  Per nursing report, patient refused to have vital signs taken this morning.  Patient is unable to discuss his hobbies.  He reports good sleep and appetite.  He does not voice any concerns or complaints at this time.  12/2: On interview today, patient is noted to be calm and cooperative, alert and oriented only to person.  Patient refused Invega  yesterday but took the medication this morning.  He reports good sleep and appetite.  He denies current symptoms of depression or anxiety.  He denies SI/HI/plan and denies hallucinations.  He continues to be minimal on interview.  He does not voice any concerns or complaints at this time.  Patient is noted to engage minimally with staff and peers.  He continues to pace hallways or lay in bed for extended periods of time.  08/21/2024: Patient seen on rounds today by psychiatry team.  Patient continues to be minimally engaged during assessment.  Patient will only respond yes or no to all assessment questions.  He will not further elaborate on any questions.  Patient continues to display flat affect and appears to be internally preoccupied with thought blocking present.  When asked about sleep and appetite, patient stated yes.  Patient stated no when asked about suicidal or homicidal ideations as well as auditory or visual hallucinations.  Per nursing staff, patient refused medications today.  Up until today, it  appears patient has been compliant with current medication regimen.  On exam with this provider, patient denied any current noted side effects.  11/30: Patient was seen for psychiatric reassessment this morning; patient was alert and oriented X 3, calm, guarded, and minimally engaged with the psychiatric team. Patient denies all psychiatric symptoms during the assessment, including anxiety, depression, mood or psychotic symptoms. However, patient provides only short, monosyllabic responses to questions with no further elaboration. Patient continues to display flat affect and may remain internally preoccupied with thought blocking present. He reports having a normal appetite and sleeping good at night. Patient denies current suicidal- homicidal ideations or perceptual disturbances today. Patient remains medication compliant with the recommendations discussed with him today to continue to titrate his dose of Invega  to 9 mg daily with the plan to transition to Invega  Sustenna LAI, which he was agreeable to doing so. Patient denies any adverse reactions to the medication at this time. Will order lipid profile and Hgb A1C levels today due to not being ordered on admission and continue to evaluate and monitor patient progression.  11/29: Patient seen on rounds today by psychiatry.  Patient is noted to be calm with psychiatry team but minimally engaged during assessment.  Patient continues to display flat affect and potential thought blocking.  He will only answer this provider's assessment questions with a yes or no answer.  He denied suicidal or homicidal ideations.  He also denied any auditory or visual hallucinations, but would not further elaborate when this provider asked further assessment questions.  It  appears he continues to be compliant with current medication recommendations.  Per nursing staff, patient has continued to maintain safe behaviors on the unit.  11/28: On interview today, patient is noted to  be calm but continues to be minimally engaged in interview.  He continues to display a flat affect and thought blocking.  He continues to answer questions with yes or no answer without further elaboration.  He denies SI/HI/plan and denies hallucinations.  He reports stable sleep and appetite.  He is agreeable to Invega  dose increase.  He has been medication compliant and has maintained safe behaviors on the unit.  He walks away from provider during interview.  11/27: On interview today patient is noted to be calm and cooperative, engages minimally in interview.  He continues to display flat affect and thought blocking.  He continues to primarily answer questions with yes or no answers.  Per nursing report, patient initially refused Invega  last night but did eventually take it today.  Provider discussed treatment plan with patient, patient denied any questions or concerns regarding medication or treatment plan.  He reports good sleep and appetite.  He denies SI/HI/plan and denies hallucinations.  He is unable to participate in meaningful conversation.  He does not voice any concerns or complaints at this time.  11/26: On interview today, patient is noted to be calm and cooperative, alert and oriented.  He displays a flat affect.  He is oriented to self but not able to give date, location, or situation.  He does not respond appropriately to questions, often answering with a yes or no answer.  He is unable to discuss what he had for breakfast, stating I don't remember.  He continues to lack insight.  He reports good sleep and appetite.  He denies SI/HI/plan and denies hallucinations.  He does not voice any concerns or complaints at this time.  He reports good mood.  He denies symptoms of depression or anxiety.  He did not require any behavioral PRNs overnight.  He interacts minimally with peers and staff.  He is noted to be pacing the halls of the unit or laying in bed.  He is not observed responding to internal  stimuli on exam today, though does continue to display thought blocking. Will repeat CBC due to elevated WBC on initial hospital presentation. Per chart review, patient previously responded well to Invega , will restart.  Patient has repeatedly refused EKG. Patient's most recent EKG from May 2025 showed QTc of 375 with normal sinus rhythm.   Collateral obtained from patient's sister, Clee Pandit.  She reports patient's most recent psychiatric hospitalization was in May 2025.  She reports noncompliance with medication or outpatient follow-up.  She states he has not taken any psychotropic medication since his last hospitalization.  She also reports patient was set up with ACT team but patient refused their visits.  She reports behavior at home includes pacing, laying in bed for hours, or standing in the same place at home for hours.  She has observed him responding to internal stimuli, talking to himself or laughing inappropriately.  She reports some days where patient is nonverbal, and other days are patient is more interactive.  She reports patient was previously diagnosed with schizophrenia approximately 2 years ago.  She denies history of developmental or cognitive abnormalities.  She reports history of drug use in patient but is not aware of any recent drug use. She denies any recent episodes of violence.  She denies knowledge of previous suicide attempt  or self-harm behaviors.  She reports previous overdose of cocaine at age 57 or 53.  Past Psychiatric History: see h&P Family History: History reviewed. No pertinent family history. Social History:  Social History   Substance and Sexual Activity  Alcohol Use Never     Social History   Substance and Sexual Activity  Drug Use Yes    Social History   Socioeconomic History   Marital status: Single    Spouse name: Not on file   Number of children: Not on file   Years of education: Not on file   Highest education level: Not on file   Occupational History   Not on file  Tobacco Use   Smoking status: Never   Smokeless tobacco: Never   Tobacco comments:    Patient denied current use of tobacco  Vaping Use   Vaping status: Never Used  Substance and Sexual Activity   Alcohol use: Never   Drug use: Yes   Sexual activity: Not on file  Other Topics Concern   Not on file  Social History Narrative   Not on file   Social Drivers of Health   Financial Resource Strain: Not on file  Food Insecurity: Food Insecurity Present (08/14/2024)   Hunger Vital Sign    Worried About Running Out of Food in the Last Year: Sometimes true    Ran Out of Food in the Last Year: Never true  Transportation Needs: No Transportation Needs (08/14/2024)   PRAPARE - Administrator, Civil Service (Medical): No    Lack of Transportation (Non-Medical): No  Physical Activity: Not on file  Stress: Not on file  Social Connections: Unknown (01/17/2024)   Social Connection and Isolation Panel    Frequency of Communication with Friends and Family: Patient declined    Frequency of Social Gatherings with Friends and Family: Patient declined    Attends Religious Services: Patient declined    Database Administrator or Organizations: Patient declined    Attends Banker Meetings: Patient declined    Marital Status: Never married   Past Medical History:  Past Medical History:  Diagnosis Date   Bizarre behavior 12/02/2019   History reviewed. No pertinent surgical history.  Current Medications: Current Facility-Administered Medications  Medication Dose Route Frequency Provider Last Rate Last Admin   acetaminophen  (TYLENOL ) tablet 650 mg  650 mg Oral Q6H PRN Bobbitt, Shalon E, NP       alum & mag hydroxide-simeth (MAALOX/MYLANTA) 200-200-20 MG/5ML suspension 30 mL  30 mL Oral Q4H PRN Bobbitt, Shalon E, NP       haloperidol  (HALDOL ) tablet 5 mg  5 mg Oral TID PRN Bobbitt, Shalon E, NP       And   diphenhydrAMINE  (BENADRYL )  capsule 50 mg  50 mg Oral TID PRN Bobbitt, Shalon E, NP       haloperidol  lactate (HALDOL ) injection 5 mg  5 mg Intramuscular TID PRN Bobbitt, Shalon E, NP       And   diphenhydrAMINE  (BENADRYL ) injection 50 mg  50 mg Intramuscular TID PRN Bobbitt, Shalon E, NP       And   LORazepam  (ATIVAN ) injection 2 mg  2 mg Intramuscular TID PRN Bobbitt, Shalon E, NP       haloperidol  lactate (HALDOL ) injection 10 mg  10 mg Intramuscular TID PRN Bobbitt, Shalon E, NP       And   diphenhydrAMINE  (BENADRYL ) injection 50 mg  50 mg Intramuscular TID PRN Bobbitt, Shalon  E, NP       And   LORazepam  (ATIVAN ) injection 2 mg  2 mg Intramuscular TID PRN Bobbitt, Shalon E, NP       hydrOXYzine  (ATARAX ) tablet 25 mg  25 mg Oral TID PRN Bobbitt, Shalon E, NP       magnesium  hydroxide (MILK OF MAGNESIA) suspension 30 mL  30 mL Oral Daily PRN Bobbitt, Shalon E, NP       paliperidone  (INVEGA ) 24 hr tablet 9 mg  9 mg Oral Daily Montague, Tula Schryver J, NP   9 mg at 08/23/24 9085   traZODone  (DESYREL ) tablet 50 mg  50 mg Oral QHS PRN Bobbitt, Shalon E, NP   50 mg at 08/21/24 2119    Lab Results:  No results found for this or any previous visit (from the past 48 hours).   Blood Alcohol level:  Lab Results  Component Value Date   [facility] <15 08/13/2024   ETH <15 01/14/2024    Metabolic Disorder Labs: Lab Results  Component Value Date   HGBA1C 5.1 08/21/2024   MPG 100 08/21/2024   MPG 96.8 01/18/2023   No results found for: PROLACTIN Lab Results  Component Value Date   CHOL 155 08/21/2024   TRIG 73 08/21/2024   HDL 46 08/21/2024   CHOLHDL 3.4 08/21/2024   VLDL 15 08/21/2024   LDLCALC 95 08/21/2024   LDLCALC 81 01/18/2023    Physical Findings: AIMS:  , ,  ,  ,    CIWA:    COWS:      Psychiatric Specialty Exam:  Presentation  General Appearance:  Appropriate for Environment  Eye Contact: Fleeting  Speech: Clear and Coherent  Speech Volume: Normal    Mood and Affect   Mood: Euthymic  Affect: Constricted   Thought Process  Thought Processes: Other (comment) (Thought blocking)  Orientation:Partial (Oriented to person; unable to give date, location, or situation)  Thought Content:Scattered  Hallucinations: Denies Ideas of Reference:None  Suicidal Thoughts: Denies Homicidal Thoughts: Denies  Sensorium  Memory: Immediate Fair  Judgment: Impaired  Insight: Lacking   Art Therapist  Concentration: Fair  Attention Span: Fair  Recall: [employer] of Knowledge: Poor  Language: Poor   Psychomotor Activity  Psychomotor Activity: Normal Musculoskeletal: Strength & Muscle Tone: within normal limits Gait & Station: normal Assets  Assets: Social Support    Physical Exam: Physical Exam Pulmonary:     Effort: Pulmonary effort is normal.  Neurological:     Mental Status: He is alert.    Review of Systems  Respiratory:  Negative for shortness of breath.   Cardiovascular:  Negative for chest pain.  Psychiatric/Behavioral:  Negative for suicidal ideas.    Blood pressure 131/87, pulse 70, temperature 97.6 F (36.4 C), temperature source Oral, resp. rate 20, height 5' 10 (1.778 m), weight 75.8 kg, SpO2 100%. Body mass index is 23.96 kg/m.  Diagnosis: Principal Problem:   Undifferentiated schizophrenia (HCC)   PLAN: Safety and Monitoring:  -- Voluntary admission to inpatient psychiatric unit for safety, stabilization and treatment  -- Daily contact with patient to assess and evaluate symptoms and progress in treatment  -- Patient's case to be discussed in multi-disciplinary team meeting  -- Observation Level : q15 minute checks  -- Vital signs:  q12 hours  -- Precautions: suicide, elopement, and assault -- Encouraged patient to participate in unit milieu and in scheduled group therapies  2. Psychiatric Treatment:  Scheduled Medications: Continue Invega  9 mg once daily -monitor for patient  compliance with  current regimen - Possibly Consider LAI if patient agreeable to help promote medication adherence     -- The risks/benefits/side-effects/alternatives to this medication were discussed in detail with the patient and time was given for questions. The patient consents to medication trial.  3. Medical Issues Being Addressed:  No acute concerns  4. Discharge Planning:   -- Social work and case management to assist with discharge planning and identification of hospital follow-up needs prior to discharge  -- Estimated LOS: Unable to estimate at this time  [employer], PA-C This note was created using Scientist, clinical (histocompatibility and immunogenetics). Please excuse any inadvertent transcription errors. Case was discussed with supervising physician Dr. Jadapalle who is agreeable with current plan.

## 2024-08-23 NOTE — Progress Notes (Signed)
   08/23/24 1000  Psych Admission Type (Psych Patients Only)  Admission Status Involuntary  Psychosocial Assessment  Patient Complaints None  Eye Contact Poor  Facial Expression Flat  Affect Blunted  Speech Soft (brief answers yes or no)  Interaction Isolative  Motor Activity Slow  Appearance/Hygiene Unremarkable  Behavior Characteristics Calm  Mood Suspicious  Thought Process  Coherency WDL  Content Preoccupation  Delusions None reported or observed  Perception WDL  Hallucination None reported or observed  Judgment Poor  Confusion Moderate  Danger to Self  Current suicidal ideation? Denies

## 2024-08-23 NOTE — Group Note (Signed)
 Date:  08/23/2024 Time:  1:28 PM  Group Topic/Focus:  Managing Feelings:   The focus of this group is to identify what feelings patients have difficulty handling and develop a plan to handle them in a healthier way upon discharge.    Participation Level:  Did Not Attend   Skippy LITTIE Bennett 08/23/2024, 1:28 PM

## 2024-08-23 NOTE — Plan of Care (Signed)
   Problem: Education: Goal: Emotional status will improve Outcome: Progressing Goal: Mental status will improve Outcome: Progressing Goal: Verbalization of understanding the information provided will improve Outcome: Progressing   Problem: Activity: Goal: Interest or engagement in activities will improve Outcome: Progressing Goal: Sleeping patterns will improve Outcome: Progressing

## 2024-08-23 NOTE — Progress Notes (Signed)
   08/23/24 0900  Vital Signs  Temp  (refused V/S)

## 2024-08-23 NOTE — Progress Notes (Signed)
 12/ 11/2023 Letter to the Court  Reg: [name] DOB: 1995-11-27  Eric Pineda has been hospitalized since 08/14/24 at [facility]) inpatient psychiatric unit. Patient is currently  on an IVC after having been picked up by police [place] the [place] of [address], where he had apparently been sitting for a very long period of time. By history, has long suffered from schizophrenia, along with a long history of lack of participation in any type of treatment. Patient is currently being treated for Schizophrenia.   Patient is having poor response to treatment and is going through medication adjustments. As this process is time consuming patient has no safe place to go with out supervision.   We appreciate the court's assistance in stabilizing and currently we request another 30 days of inpatient care to further stabilize.  Sincerely, Benjamyn Hestand.MD [facility]

## 2024-08-23 NOTE — Plan of Care (Signed)
   Problem: Education: Goal: Emotional status will improve Outcome: Progressing

## 2024-08-23 NOTE — Group Note (Deleted)
 Date:  08/23/2024 Time:  11:09 AM  Group Topic/Focus:  Managing Feelings:   The focus of this group is to identify what feelings patients have difficulty handling and develop a plan to handle them in a healthier way upon discharge.     Participation Level:  {BHH PARTICIPATION OZCZO:77735}  Participation Quality:  {BHH PARTICIPATION QUALITY:22265}  Affect:  {BHH AFFECT:22266}  Cognitive:  {BHH COGNITIVE:22267}  Insight: {BHH Insight2:20797}  Engagement in Group:  {BHH ENGAGEMENT IN HMNLE:77731}  Modes of Intervention:  {BHH MODES OF INTERVENTION:22269}  Additional Comments:  ***  Eric Pineda L Nicki Furlan 08/23/2024, 11:09 AM

## 2024-08-23 NOTE — Progress Notes (Signed)
   08/23/24 0000  Psych Admission Type (Psych Patients Only)  Admission Status Involuntary  Psychosocial Assessment  Patient Complaints None  Eye Contact Poor  Facial Expression Flat  Affect Flat  Speech Soft  Interaction Isolative  Motor Activity Slow  Appearance/Hygiene Unremarkable  Behavior Characteristics Calm  Mood Suspicious  Aggressive Behavior  Effect No apparent injury  Thought Process  Coherency WDL  Content Preoccupation  Delusions None reported or observed  Perception WDL  Hallucination None reported or observed  Judgment Poor  Confusion Moderate  Danger to Self  Current suicidal ideation? Denies  Self-Injurious Behavior No self-injurious ideation or behavior indicators observed or expressed   Agreement Not to Harm Self Yes  Danger to Others  Danger to Others None reported or observed

## 2024-08-23 NOTE — Progress Notes (Signed)
   08/23/24 2030  Psych Admission Type (Psych Patients Only)  Admission Status Involuntary  Psychosocial Assessment  Patient Complaints None  Eye Contact Poor  Facial Expression Flat  Affect Blunted  Speech Soft  Interaction Isolative  Motor Activity Slow  Appearance/Hygiene Unremarkable  Behavior Characteristics Guarded  Mood Suspicious  Thought Process  Coherency WDL  Content Preoccupation  Delusions None reported or observed  Perception WDL  Hallucination None reported or observed  Judgment Poor  Confusion Moderate  Danger to Self  Current suicidal ideation? Denies  Agreement Not to Harm Self Yes  Description of Agreement verbal  Danger to Others  Danger to Others Reported or observed

## 2024-08-24 MED ORDER — HALOPERIDOL 5 MG PO TABS
5.0000 mg | ORAL_TABLET | Freq: Every day | ORAL | Status: DC
  Administered 2024-08-25 – 2024-08-26 (×2): 5 mg via ORAL
  Filled 2024-08-24 (×2): qty 1

## 2024-08-24 NOTE — BHH Counselor (Signed)
 CSW contacted [facility] Department of Social Services to complete APS report for concerns on patient's ability to care for self.   CSW did call and complete the report with  Nena.  CSW reported that physician is recommending that pt be considered for guardianship.  Sherryle Margo, MSW, LCSW 08/24/2024 2:51 PM

## 2024-08-24 NOTE — Group Note (Signed)
 Recreation Therapy Group Note   Group Topic:Relaxation  Group Date: 08/24/2024 Start Time: 1000 End Time: 1045 Facilitators: Celestia Jeoffrey BRAVO, LRT, CTRS Location: Craft Room  Group Description: PMR (Progressive Muscle Relaxation). LRT asks patients their current level of stress/anxiety from 1-10, with 10 being the highest. LRT educates patients on what PMR is and the benefits that come from it. Patients are asked to sit with their feet flat on the floor while sitting up and all the way back in their chair, if possible. LRT and pts follow a prompt through a speaker that requires you to tense and release different muscles in their body and focus on their breathing. During session, lights are off and soft music is being played. Pts are given a stress ball to use if needed. At the end of the prompt, LRT asks patients to rank their current levels of stress/anxiety from 1-10, 10 being the highest. LRT provides patients with an education handout on PMR.   Goal Area(s) Addressed:  Patients will be able to describe progressive muscle relaxation.  Patient will practice using relaxation technique. Patient will identify a new coping skill.  Patient will follow multistep directions to reduce anxiety and stress.   Affect/Mood: N/A   Participation Level: Did not attend    Clinical Observations/Individualized Feedback: Patient did not attend group.   Plan: Continue to engage patient in RT group sessions 2-3x/week.   Jeoffrey BRAVO Celestia, LRT, CTRS 08/24/2024 1:23 PM

## 2024-08-24 NOTE — Group Note (Signed)
 Recreation Therapy Group Note   Group Topic:Stress Management  Group Date: 08/24/2024 Start Time: 1530 End Time: 1620 Facilitators: Celestia Jeoffrey FORBES ARTICE, CTRS Location: Craft Room  Group Description: Taboo. LRT and patients played the game Taboo. The object of the game is to have peers guess the word up at the top of the card drawn that is in bold, while being sure to not use any of the descriptive words down below it on the same card. If the person attempting to explain the word in bold uses any of the descriptive words down below, they lose their turn, and no one receives that card or point. LRT and patient's took turns being the one to describe the words while the rest of the group tried to guess what they were describing.   Goal Area(s) Addressed: Patient will identify physical symptoms of stress. Patient will identify emotional symptoms of stress. Patient will identify coping skills for stress. Patient will build frustration tolerance skills.  Patient will increase communication.   Affect/Mood: N/A   Participation Level: Did not attend    Clinical Observations/Individualized Feedback: Patient did not attend group.   Plan: Continue to engage patient in RT group sessions 2-3x/week.   Jeoffrey FORBES Celestia, LRT, CTRS 08/24/2024 5:27 PM

## 2024-08-24 NOTE — Plan of Care (Signed)
  Problem: Education: Goal: Mental status will improve Outcome: Progressing   

## 2024-08-24 NOTE — Progress Notes (Signed)
 Patient ID: Eric Pineda, male   DOB: 02/25/1996, 28 y.o.   MRN: [id number] [facility] MD Progress Note  08/24/2024 1:15 PM Eric Pineda  MRN:  [id number]   Subjective:  Chart reviewed, case discussed in multidisciplinary meeting, patient seen during rounds.   12/4: On assessment today, patient continues to be minimally engaged in interview.  Patient appears internally preoccupied and displays thought blocking on exam.  Patient will only provide yes or no answers to assessment question, despite encouragement to engage further.  Per nursing staff, patient has been compliant with medications for the last 3 days, but went through a period where he was refusing medications on the inpatient unit.  It is noted through chart review, when patient discharged, patient often refuses to take his prescribed psychiatric medications and often ends up being readmitted to the psychiatric inpatient unit due to worsening of symptoms.  Patient continues to refuse to go or participate in group therapy.  When asked about depression or anxiety, patient covers head with his blanket.  12/3: On interview today, patient is noted to be calm and cooperative, alert, and oriented only to person.  He continues to engage minimally in interview.  He was compliant with medication this morning.  He denies current symptoms of depression or anxiety.  He denies SI/HI/plan and denies hallucinations.  Per nursing report, patient refused to have vital signs taken this morning.  Patient is unable to discuss his hobbies.  He reports good sleep and appetite.  He does not voice any concerns or complaints at this time.  12/2: On interview today, patient is noted to be calm and cooperative, alert and oriented only to person.  Patient refused Invega  yesterday but took the medication this morning.  He reports good sleep and appetite.  He denies current symptoms of depression or anxiety.  He denies SI/HI/plan and denies hallucinations.  He continues to be  minimal on interview.  He does not voice any concerns or complaints at this time.  Patient is noted to engage minimally with staff and peers.  He continues to pace hallways or lay in bed for extended periods of time.  08/21/2024: Patient seen on rounds today by psychiatry team.  Patient continues to be minimally engaged during assessment.  Patient will only respond yes or no to all assessment questions.  He will not further elaborate on any questions.  Patient continues to display flat affect and appears to be internally preoccupied with thought blocking present.  When asked about sleep and appetite, patient stated yes.  Patient stated no when asked about suicidal or homicidal ideations as well as auditory or visual hallucinations.  Per nursing staff, patient refused medications today.  Up until today, it appears patient has been compliant with current medication regimen.  On exam with this provider, patient denied any current noted side effects.  11/30: Patient was seen for psychiatric reassessment this morning; patient was alert and oriented X 3, calm, guarded, and minimally engaged with the psychiatric team. Patient denies all psychiatric symptoms during the assessment, including anxiety, depression, mood or psychotic symptoms. However, patient provides only short, monosyllabic responses to questions with no further elaboration. Patient continues to display flat affect and may remain internally preoccupied with thought blocking present. He reports having a normal appetite and sleeping good at night. Patient denies current suicidal- homicidal ideations or perceptual disturbances today. Patient remains medication compliant with the recommendations discussed with him today to continue to titrate his dose of Invega  to 9 mg daily with  the plan to transition to Invega  Sustenna LAI, which he was agreeable to doing so. Patient denies any adverse reactions to the medication at this time. Will order lipid profile  and Hgb A1C levels today due to not being ordered on admission and continue to evaluate and monitor patient progression.  11/29: Patient seen on rounds today by psychiatry.  Patient is noted to be calm with psychiatry team but minimally engaged during assessment.  Patient continues to display flat affect and potential thought blocking.  He will only answer this provider's assessment questions with a yes or no answer.  He denied suicidal or homicidal ideations.  He also denied any auditory or visual hallucinations, but would not further elaborate when this provider asked further assessment questions.  It appears he continues to be compliant with current medication recommendations.  Per nursing staff, patient has continued to maintain safe behaviors on the unit.  11/28: On interview today, patient is noted to be calm but continues to be minimally engaged in interview.  He continues to display a flat affect and thought blocking.  He continues to answer questions with yes or no answer without further elaboration.  He denies SI/HI/plan and denies hallucinations.  He reports stable sleep and appetite.  He is agreeable to Invega  dose increase.  He has been medication compliant and has maintained safe behaviors on the unit.  He walks away from provider during interview.  11/27: On interview today patient is noted to be calm and cooperative, engages minimally in interview.  He continues to display flat affect and thought blocking.  He continues to primarily answer questions with yes or no answers.  Per nursing report, patient initially refused Invega  last night but did eventually take it today.  Provider discussed treatment plan with patient, patient denied any questions or concerns regarding medication or treatment plan.  He reports good sleep and appetite.  He denies SI/HI/plan and denies hallucinations.  He is unable to participate in meaningful conversation.  He does not voice any concerns or complaints at this  time.  11/26: On interview today, patient is noted to be calm and cooperative, alert and oriented.  He displays a flat affect.  He is oriented to self but not able to give date, location, or situation.  He does not respond appropriately to questions, often answering with a yes or no answer.  He is unable to discuss what he had for breakfast, stating I don't remember.  He continues to lack insight.  He reports good sleep and appetite.  He denies SI/HI/plan and denies hallucinations.  He does not voice any concerns or complaints at this time.  He reports good mood.  He denies symptoms of depression or anxiety.  He did not require any behavioral PRNs overnight.  He interacts minimally with peers and staff.  He is noted to be pacing the halls of the unit or laying in bed.  He is not observed responding to internal stimuli on exam today, though does continue to display thought blocking. Will repeat CBC due to elevated WBC on initial hospital presentation. Per chart review, patient previously responded well to Invega , will restart.  Patient has repeatedly refused EKG. Patient's most recent EKG from May 2025 showed QTc of 375 with normal sinus rhythm.   Collateral obtained from patient's sister, Eric Pineda.  She reports patient's most recent psychiatric hospitalization was in May 2025.  She reports noncompliance with medication or outpatient follow-up.  She states he has not taken any psychotropic medication  since his last hospitalization.  She also reports patient was set up with ACT team but patient refused their visits.  She reports behavior at home includes pacing, laying in bed for hours, or standing in the same place at home for hours.  She has observed him responding to internal stimuli, talking to himself or laughing inappropriately.  She reports some days where patient is nonverbal, and other days are patient is more interactive.  She reports patient was previously diagnosed with schizophrenia  approximately 2 years ago.  She denies history of developmental or cognitive abnormalities.  She reports history of drug use in patient but is not aware of any recent drug use. She denies any recent episodes of violence.  She denies knowledge of previous suicide attempt or self-harm behaviors.  She reports previous overdose of cocaine at age 20 or 64.  Past Psychiatric History: see h&P Family History: History reviewed. No pertinent family history. Social History:  Social History   Substance and Sexual Activity  Alcohol Use Never     Social History   Substance and Sexual Activity  Drug Use Yes    Social History   Socioeconomic History   Marital status: Single    Spouse name: Not on file   Number of children: Not on file   Years of education: Not on file   Highest education level: Not on file  Occupational History   Not on file  Tobacco Use   Smoking status: Never   Smokeless tobacco: Never   Tobacco comments:    Patient denied current use of tobacco  Vaping Use   Vaping status: Never Used  Substance and Sexual Activity   Alcohol use: Never   Drug use: Yes   Sexual activity: Not on file  Other Topics Concern   Not on file  Social History Narrative   Not on file   Social Drivers of Health   Financial Resource Strain: Not on file  Food Insecurity: Food Insecurity Present (08/14/2024)   Hunger Vital Sign    Worried About Running Out of Food in the Last Year: Sometimes true    Ran Out of Food in the Last Year: Never true  Transportation Needs: No Transportation Needs (08/14/2024)   PRAPARE - Administrator, Civil Service (Medical): No    Lack of Transportation (Non-Medical): No  Physical Activity: Not on file  Stress: Not on file  Social Connections: Unknown (01/17/2024)   Social Connection and Isolation Panel    Frequency of Communication with Friends and Family: Patient declined    Frequency of Social Gatherings with Friends and Family: Patient declined     Attends Religious Services: Patient declined    Database Administrator or Organizations: Patient declined    Attends Banker Meetings: Patient declined    Marital Status: Never married   Past Medical History:  Past Medical History:  Diagnosis Date   Bizarre behavior 12/02/2019   History reviewed. No pertinent surgical history.  Current Medications: Current Facility-Administered Medications  Medication Dose Route Frequency Provider Last Rate Last Admin   acetaminophen  (TYLENOL ) tablet 650 mg  650 mg Oral Q6H PRN Bobbitt, Shalon E, NP       alum & mag hydroxide-simeth (MAALOX/MYLANTA) 200-200-20 MG/5ML suspension 30 mL  30 mL Oral Q4H PRN Bobbitt, Shalon E, NP       haloperidol  (HALDOL ) tablet 5 mg  5 mg Oral TID PRN Bobbitt, Shalon E, NP       And  diphenhydrAMINE  (BENADRYL ) capsule 50 mg  50 mg Oral TID PRN Bobbitt, Shalon E, NP       haloperidol  lactate (HALDOL ) injection 5 mg  5 mg Intramuscular TID PRN Bobbitt, Shalon E, NP       And   diphenhydrAMINE  (BENADRYL ) injection 50 mg  50 mg Intramuscular TID PRN Bobbitt, Shalon E, NP       And   LORazepam  (ATIVAN ) injection 2 mg  2 mg Intramuscular TID PRN Bobbitt, Shalon E, NP       haloperidol  lactate (HALDOL ) injection 10 mg  10 mg Intramuscular TID PRN Bobbitt, Shalon E, NP       And   diphenhydrAMINE  (BENADRYL ) injection 50 mg  50 mg Intramuscular TID PRN Bobbitt, Shalon E, NP       And   LORazepam  (ATIVAN ) injection 2 mg  2 mg Intramuscular TID PRN Bobbitt, Shalon E, NP       hydrOXYzine  (ATARAX ) tablet 25 mg  25 mg Oral TID PRN Bobbitt, Shalon E, NP       magnesium  hydroxide (MILK OF MAGNESIA) suspension 30 mL  30 mL Oral Daily PRN Bobbitt, Shalon E, NP       paliperidone  (INVEGA ) 24 hr tablet 9 mg  9 mg Oral Daily Montague, Crystal J, NP   9 mg at 08/24/24 0815   traZODone  (DESYREL ) tablet 50 mg  50 mg Oral QHS PRN Bobbitt, Shalon E, NP   50 mg at 08/21/24 2119    Lab Results:  No results found for this or  any previous visit (from the past 48 hours).   Blood Alcohol level:  Lab Results  Component Value Date   [facility] <15 08/13/2024   ETH <15 01/14/2024    Metabolic Disorder Labs: Lab Results  Component Value Date   HGBA1C 5.1 08/21/2024   MPG 100 08/21/2024   MPG 96.8 01/18/2023   No results found for: PROLACTIN Lab Results  Component Value Date   CHOL 155 08/21/2024   TRIG 73 08/21/2024   HDL 46 08/21/2024   CHOLHDL 3.4 08/21/2024   VLDL 15 08/21/2024   LDLCALC 95 08/21/2024   LDLCALC 81 01/18/2023    Physical Findings: AIMS:  , ,  ,  ,    CIWA:    COWS:      Psychiatric Specialty Exam:  Presentation  General Appearance:  Appropriate for Environment  Eye Contact: Fleeting  Speech: Clear and Coherent  Speech Volume: Normal    Mood and Affect  Mood: Euthymic  Affect: Constricted   Thought Process  Thought Processes: Other (comment) (Thought blocking)  Orientation:Partial (Oriented to person; unable to give date, location, or situation)  Thought Content:Scattered  Hallucinations: Denies Ideas of Reference:None  Suicidal Thoughts: Denies Homicidal Thoughts: Denies  Sensorium  Memory: Immediate Fair  Judgment: Impaired  Insight: Lacking   Art Therapist  Concentration: Fair  Attention Span: Fair  Recall: [employer] of Knowledge: Poor  Language: Poor   Psychomotor Activity  Psychomotor Activity: Normal Musculoskeletal: Strength & Muscle Tone: within normal limits Gait & Station: normal Assets  Assets: Social Support    Physical Exam: Physical Exam Pulmonary:     Effort: Pulmonary effort is normal.  Neurological:     Mental Status: He is alert.    Review of Systems  Respiratory:  Negative for shortness of breath.   Cardiovascular:  Negative for chest pain.  Psychiatric/Behavioral:  Negative for suicidal ideas.    Blood pressure 104/75, pulse 62, temperature 97.6 F (  36.4 C), temperature source  Oral, resp. rate 20, height 5' 10 (1.778 m), weight 75.8 kg, SpO2 100%. Body mass index is 23.96 kg/m.  Diagnosis: Principal Problem:   Undifferentiated schizophrenia (HCC)   PLAN: Safety and Monitoring:  -- Voluntary admission to inpatient psychiatric unit for safety, stabilization and treatment  -- Daily contact with patient to assess and evaluate symptoms and progress in treatment  -- Patient's case to be discussed in multi-disciplinary team meeting  -- Observation Level : q15 minute checks  -- Vital signs:  q12 hours  -- Precautions: suicide, elopement, and assault -- Encouraged patient to participate in unit milieu and in scheduled group therapies  2. Psychiatric Treatment:  Scheduled Medications: Continue Invega  9 mg once daily -monitor for patient compliance with current regimen - Possibly Consider LAI if patient agreeable to help promote medication adherence  -Will add haldol  5mg  daily to target continued psychosis symptoms listed above including thought blocking and internal preoccupation     -- The risks/benefits/side-effects/alternatives to this medication were discussed in detail with the patient and time was given for questions. The patient consents to medication trial.  3. Medical Issues Being Addressed:  No acute concerns  4. Discharge Planning:   -- Social work and case management to assist with discharge planning and identification of hospital follow-up needs prior to discharge  -- Estimated LOS: Unable to estimate at this time  Zelda Sharps, NP This note was created using [employer]. Please excuse any inadvertent transcription errors. Case was discussed with supervising physician Dr. Jadapalle who is agreeable with current plan.

## 2024-08-24 NOTE — Group Note (Signed)
 Date:  08/24/2024 Time:  9:15 PM  Group Topic/Focus:  Self Care:   The focus of this group is to help patients understand the importance of self-care in order to improve or restore emotional, physical, spiritual, interpersonal, and financial health.    Pt did not attend group.  Raeven Pint L 08/24/2024, 9:15 PM

## 2024-08-24 NOTE — Group Note (Signed)
 Date:  08/24/2024 Time:  10:18 AM  Group Topic/Focus:  Goals Group:   The focus of this group is to help patients establish daily goals to achieve during treatment and discuss how the patient can incorporate goal setting into their daily lives to aide in recovery.    Participation Level:  Did Not Attend   Eric Pineda 08/24/2024, 10:18 AM

## 2024-08-24 NOTE — Plan of Care (Signed)

## 2024-08-24 NOTE — Group Note (Signed)
 [facility] LCSW Group Therapy Note   Group Date: 08/24/2024 Start Time: 1300 End Time: 1400   Type of Therapy/Topic:  Group Therapy:  Balance in Life  Participation Level:  Did Not Attend   Description of Group:    This group will address the concept of balance and how it feels and looks when one is unbalanced. Patients will be encouraged to process areas in their lives that are out of balance, and identify reasons for remaining unbalanced. Facilitators will guide patients utilizing problem- solving interventions to address and correct the stressor making their life unbalanced. Understanding and applying boundaries will be explored and addressed for obtaining  and maintaining a balanced life. Patients will be encouraged to explore ways to assertively make their unbalanced needs known to significant others in their lives, using other group members and facilitator for support and feedback.  Therapeutic Goals: Patient will identify two or more emotions or situations they have that consume much of in their lives. Patient will identify signs/triggers that life has become out of balance:  Patient will identify two ways to set boundaries in order to achieve balance in their lives:  Patient will demonstrate ability to communicate their needs through discussion and/or role plays  Summary of Patient Progress: Patient did not attend group.   Therapeutic Modalities:   Cognitive Behavioral Therapy Solution-Focused Therapy Assertiveness Training   Nadara JONELLE Fam, LCSW

## 2024-08-25 NOTE — Group Note (Signed)
 Recreation Therapy Group Note   Group Topic:Leisure Education  Group Date: 08/25/2024 Start Time: 1025 End Time: 1125 Facilitators: Celestia Jeoffrey BRAVO, LRT, CTRS Location: Craft Room  Group Description: Leisure. Patients were given the option to choose from journaling, coloring, drawing, making origami, playing with playdoh, listening to music or singing karaoke. LRT and pts discussed the meaning of leisure, the importance of participating in leisure during their free time/when they're outside of the hospital, as well as how our leisure interests can also serve as coping skills.  Goal Area(s) Addressed:  Patient will identify a current leisure interest.  Patient will learn the definition of "leisure". Patient will practice making a positive decision. Patient will have the opportunity to try a new leisure activity. Patient will communicate with peers and LRT.    Affect/Mood: N/A   Participation Level: Did not attend    Clinical Observations/Individualized Feedback: Patient did not attend group.   Plan: Continue to engage patient in RT group sessions 2-3x/week.   Jeoffrey BRAVO Celestia, LRT, CTRS 08/25/2024 12:32 PM

## 2024-08-25 NOTE — Progress Notes (Addendum)
 Pt spent the entire day in his room except for meal times. Pt declined to get up up for his medications but, accepted them when approached with them. Pt was asked why he never come to accept meds and he stated I don't know, did not make eye contact with writer, denied SI, Plan or intent and denied AVH.     08/25/24 1200  Psych Admission Type (Psych Patients Only)  Admission Status Voluntary  Psychosocial Assessment  Patient Complaints None  Eye Contact Poor  Facial Expression Flat  Affect Depressed  Speech Soft;Slow  Interaction Isolative;Minimal  Motor Activity Slow  Appearance/Hygiene Body odor;Disheveled  Behavior Characteristics Calm;Guarded  Mood Sad;Sullen  Thought Process  Coherency WDL  Content Preoccupation  Delusions None reported or observed  Perception WDL  Hallucination None reported or observed  Judgment Poor  Confusion Mild  Danger to Self  Current suicidal ideation? Denies  Description of Suicide Plan `  Agreement Not to Harm Self Yes

## 2024-08-25 NOTE — [facility] Group Notes (Signed)
 Spirituality Group   Description: Participant directed exploration of values, beliefs and meaning   **Focus on Gratitude: Invite reflection on sources of gratitude (external/internal); goal to invite internal gratitude to foster 1) reconnection with life-giving activities 2) self-compassion.     Following a brief framework of chaplain's role and ground rules of group behavior, participants are invited to share concerns or questions that engage spiritual life. Emphasis placed on common themes and shared experiences and ways to make meaning and clarify living into one's values.   Theory/Process/Goal: Utilize the theoretical framework of group therapy established by Celena Kite, Relational Cultural Theory and Rogerian approaches to facilitate relational empathy and use of the "here and now" to foster reflection, self-awareness, and sharing.   Observations: Eric Pineda was an active participant in the group discussion.  [place] Ndiaye L. Delores [name].Div

## 2024-08-25 NOTE — Group Note (Signed)
 Date:  08/25/2024 Time:  8:52 PM  Group Topic/Focus:  Healthy Communication:   The focus of this group is to discuss communication, barriers to communication, as well as healthy ways to communicate with others.    Pt did not attend group.  Wali Reinheimer L 08/25/2024, 8:52 PM

## 2024-08-25 NOTE — Group Note (Signed)
 Recreation Therapy Group Note   Group Topic:Coping Skills  Group Date: 08/25/2024 Start Time: 1530 End Time: 1610 Facilitators: Celestia Jeoffrey BRAVO, LRT, CTRS Location: Dayroom  Group Description: Meditation. LRT asks patients their current level of stress/anxiety from 1-10, with 10 being the highest. LRT educated on the benefits of meditation and relaxation techniques, and how it can apply to everyday life post-discharge. LRT and pt's followed along to an audio script of a "guided meditation" video. LRT asked pt their level of stress and anxiety once the prompt was finished. LRT facilitated post-activity processing to gain feedback on session.   Goal Area(s) Addressed:  Patient will practice using relaxation technique. Patient will identify a new coping skill.  Patient will follow multistep directions to reduce anxiety and stress.   Affect/Mood: N/A   Participation Level: Did not attend    Clinical Observations/Individualized Feedback: Patient did not attend group.   Plan: Continue to engage patient in RT group sessions 2-3x/week.   Jeoffrey BRAVO Celestia, LRT, CTRS 08/25/2024 5:21 PM

## 2024-08-25 NOTE — Plan of Care (Signed)
  Problem: Education: Goal: Knowledge of [place] [facility] Education information/materials will improve Outcome: Not Met (add Reason) Goal: Emotional status will improve Outcome: Not Progressing Goal: Mental status will improve Outcome: Not Progressing   Problem: Activity: Goal: Interest or engagement in activities will improve Outcome: Not Progressing   Problem: Safety: Goal: Periods of time without injury will increase Outcome: Not Progressing

## 2024-08-25 NOTE — Progress Notes (Signed)
   08/25/24 0600  Psych Admission Type (Psych Patients Only)  Admission Status Involuntary  Psychosocial Assessment  Patient Complaints None  Eye Contact Poor  Facial Expression Flat  Affect Blunted  Speech Soft  Interaction Isolative;Minimal  Motor Activity Other (Comment)  Appearance/Hygiene Body odor  Behavior Characteristics Guarded  Mood Suspicious  Thought Process  Coherency WDL  Content Preoccupation  Delusions None reported or observed  Perception WDL  Hallucination None reported or observed  Judgment Poor  Danger to Self  Current suicidal ideation? Denies  Agreement Not to Harm Self Yes  Danger to Others  Danger to Others None reported or observed

## 2024-08-25 NOTE — Group Note (Signed)
 Date:  08/25/2024 Time:  10:19 AM  Group Topic/Focus:  Building Self Esteem:   The Focus of this group is helping patients become aware of the effects of self-esteem on their lives, the things they and others do that enhance or undermine their self-esteem, seeing the relationship between their level of self-esteem and the choices they make and learning ways to enhance self-esteem.    Participation Level:  Did Not Attend   [name] [name] Eric Pineda 08/25/2024, 10:19 AM

## 2024-08-26 MED ORDER — HALOPERIDOL 5 MG PO TABS
5.0000 mg | ORAL_TABLET | Freq: Two times a day (BID) | ORAL | Status: DC
  Administered 2024-08-27 – 2024-08-28 (×4): 5 mg via ORAL
  Filled 2024-08-26 (×4): qty 1

## 2024-08-26 NOTE — Group Note (Signed)
 Date:  08/26/2024 Time:  8:39 PM   Additional Comments:  Did not attend group.  Eric Pineda 08/26/2024, 8:39 PM

## 2024-08-26 NOTE — BH IP Treatment Plan (Signed)
 Interdisciplinary Treatment and Diagnostic Plan Update  08/26/2024 Time of Session: 4:45 pm Eric Pineda MRN: [id number]  Principal Diagnosis: Undifferentiated schizophrenia [facility])  Secondary Diagnoses: Principal Problem:   Undifferentiated schizophrenia (HCC)   Current Medications:  Current Facility-Administered Medications  Medication Dose Route Frequency Provider Last Rate Last Admin   acetaminophen  (TYLENOL ) tablet 650 mg  650 mg Oral Q6H PRN Bobbitt, Shalon E, NP       alum & mag hydroxide-simeth (MAALOX/MYLANTA) 200-200-20 MG/5ML suspension 30 mL  30 mL Oral Q4H PRN Bobbitt, Shalon E, NP       haloperidol  (HALDOL ) tablet 5 mg  5 mg Oral TID PRN Bobbitt, Shalon E, NP       And   diphenhydrAMINE  (BENADRYL ) capsule 50 mg  50 mg Oral TID PRN Bobbitt, Shalon E, NP       haloperidol  lactate (HALDOL ) injection 5 mg  5 mg Intramuscular TID PRN Bobbitt, Shalon E, NP       And   diphenhydrAMINE  (BENADRYL ) injection 50 mg  50 mg Intramuscular TID PRN Bobbitt, Shalon E, NP       And   LORazepam  (ATIVAN ) injection 2 mg  2 mg Intramuscular TID PRN Bobbitt, Shalon E, NP       haloperidol  lactate (HALDOL ) injection 10 mg  10 mg Intramuscular TID PRN Bobbitt, Shalon E, NP       And   diphenhydrAMINE  (BENADRYL ) injection 50 mg  50 mg Intramuscular TID PRN Bobbitt, Shalon E, NP       And   LORazepam  (ATIVAN ) injection 2 mg  2 mg Intramuscular TID PRN Bobbitt, Shalon E, NP       [START ON 08/27/2024] haloperidol  (HALDOL ) tablet 5 mg  5 mg Oral BID Hunter, Crystal L, PA-C       hydrOXYzine  (ATARAX ) tablet 25 mg  25 mg Oral TID PRN Bobbitt, Shalon E, NP       magnesium  hydroxide (MILK OF MAGNESIA) suspension 30 mL  30 mL Oral Daily PRN Bobbitt, Shalon E, NP       paliperidone  (INVEGA ) 24 hr tablet 9 mg  9 mg Oral Daily Montague, Crystal J, NP   9 mg at 08/26/24 9161   traZODone  (DESYREL ) tablet 50 mg  50 mg Oral QHS PRN Bobbitt, Shalon E, NP   50 mg at 08/21/24 2119   PTA Medications: Medications  Prior to Admission  Medication Sig Dispense Refill Last Dose/Taking   benztropine  (COGENTIN ) 0.5 MG tablet Take 1 tablet (0.5 mg total) by mouth daily. (Patient not taking: Reported on 08/14/2024) 30 tablet 0    paliperidone  (INVEGA  SUSTENNA) 234 MG/1.5ML injection Inject 234 mg into the muscle every 28 (twenty-eight) days. Next dose due q28 days, 02/24/2024 (Patient not taking: Reported on 08/14/2024) 1.5 mL 0     Patient Stressors: Health problems   Medication change or noncompliance    Patient Strengths: Motivation for treatment/growth  Physical Health  Supportive family/friends   Treatment Modalities: Medication Management, Group therapy, Case management,  1 to 1 session with clinician, Psychoeducation, Recreational therapy.   Physician Treatment Plan for Primary Diagnosis: Undifferentiated schizophrenia (HCC) Long Term Goal(s): Improvement in symptoms so as ready for discharge   Short Term Goals: Ability to identify changes in lifestyle to reduce recurrence of condition will improve Ability to verbalize feelings will improve Ability to maintain clinical measurements within normal limits will improve Compliance with prescribed medications will improve  Medication Management: Evaluate patient's response, side effects, and tolerance of medication regimen.  Therapeutic  Interventions: 1 to 1 sessions, Unit Group sessions and Medication administration.  Evaluation of Outcomes: Not Progressing  Physician Treatment Plan for Secondary Diagnosis: Principal Problem:   Undifferentiated schizophrenia (HCC)  Long Term Goal(s): Improvement in symptoms so as ready for discharge   Short Term Goals: Ability to identify changes in lifestyle to reduce recurrence of condition will improve Ability to verbalize feelings will improve Ability to maintain clinical measurements within normal limits will improve Compliance with prescribed medications will improve     Medication Management: Evaluate  patient's response, side effects, and tolerance of medication regimen.  Therapeutic Interventions: 1 to 1 sessions, Unit Group sessions and Medication administration.  Evaluation of Outcomes: Not Progressing   RN Treatment Plan for Primary Diagnosis: Undifferentiated schizophrenia (HCC) Long Term Goal(s): Knowledge of disease and therapeutic regimen to maintain health will improve  Short Term Goals: Ability to remain free from injury will improve, Ability to verbalize frustration and anger appropriately will improve, Ability to demonstrate self-control, Ability to participate in decision making will improve, Ability to verbalize feelings will improve, Ability to disclose and discuss suicidal ideas, Ability to identify and develop effective coping behaviors will improve, and Compliance with prescribed medications will improve  Medication Management: RN will administer medications as ordered by provider, will assess and evaluate patient's response and provide education to patient for prescribed medication. RN will report any adverse and/or side effects to prescribing provider.  Therapeutic Interventions: 1 on 1 counseling sessions, Psychoeducation, Medication administration, Evaluate responses to treatment, Monitor vital signs and CBGs as ordered, Perform/monitor CIWA, COWS, AIMS and Fall Risk screenings as ordered, Perform wound care treatments as ordered.  Evaluation of Outcomes: Not Progressing   LCSW Treatment Plan for Primary Diagnosis: Undifferentiated schizophrenia (HCC) Long Term Goal(s): Safe transition to appropriate next level of care at discharge, Engage patient in therapeutic group addressing interpersonal concerns.  Short Term Goals: Engage patient in aftercare planning with referrals and resources, Increase social support, Increase ability to appropriately verbalize feelings, Increase emotional regulation, Facilitate acceptance of mental health diagnosis and concerns, and Increase  skills for wellness and recovery  Therapeutic Interventions: Assess for all discharge needs, 1 to 1 time with Social worker, Explore available resources and support systems, Assess for adequacy in community support network, Educate family and significant other(s) on suicide prevention, Complete Psychosocial Assessment, Interpersonal group therapy.  Evaluation of Outcomes: Not Progressing   Progress in Treatment: Attending groups: No. Participating in groups: No. Taking medication as prescribed: Yes. Toleration medication: Yes. Family/Significant other contact made: Yes, individual(s) contacted:  Myrick, mother, [phone] Patient understands diagnosis: No. Discussing patient identified problems/goals with staff: No. Medical problems stabilized or resolved: Yes. Denies suicidal/homicidal ideation: Yes. Issues/concerns per patient self-inventory: No. Other: None  New problem(s) identified: No, Describe:  None  Update 08/21/2024: No changes at this time.  Update 08/26/2024: No changes at this time.   New Short Term/Long Term Goal(s):detox, elimination of symptoms of psychosis, medication management for mood stabilization; elimination of SI thoughts; development of comprehensive mental wellness/sobriety plan.  Update 08/21/2024: No changes at this time.  Update 08/26/2024: No changes at this time.   Patient Goals:  Patient declined.  Update 08/21/2024: No changes at this time.  Update 08/26/2024: No changes at this time.   Discharge Plan or Barriers: CSW to assist with the development of appropriate discharge plan.  Update 08/21/2024: Patient continues to decline referrals for aftecare following his admission.  Patient's mother reports that the patient can return to the home and she  will provide transportation.  Update 08/26/2024: No changes at this time.    Reason for Continuation of Hospitalization: Anxiety Delusions  Depression Hallucinations Suicidal ideation   Estimated Length of  Stay: 1-7 days.  Update 08/21/2024: TBD  Update 08/26/2024: TBD  Last 3 [place] Suicide Severity Risk Score: Flowsheet Row Admission (Current) from 08/14/2024 in [facility] INPATIENT BEHAVIORAL MEDICINE ED from 08/13/2024 in [facility] Emergency Department at [facility] Admission (Discharged) from 01/17/2024 in [facility] INPATIENT BEHAVIORAL MEDICINE  C-SSRS RISK CATEGORY No Risk No Risk No Risk    Last PHQ 2/9 Scores:     No data to display          Scribe for Treatment Team: Roselyn GORMAN Lento, LCSW 08/26/2024 4:45 PM

## 2024-08-26 NOTE — Progress Notes (Signed)
   08/26/24 0830  Psych Admission Type (Psych Patients Only)  Admission Status Voluntary  Psychosocial Assessment  Patient Complaints None  Eye Contact Brief  Facial Expression Flat  Affect Depressed  Speech Soft  Interaction Isolative;Minimal  Motor Activity Other (Comment) (WNL)  Appearance/Hygiene In scrubs  Behavior Characteristics Guarded  Mood Depressed;Pleasant  Thought Process  Coherency WDL  Content Preoccupation  Delusions None reported or observed  Perception WDL  Hallucination None reported or observed  Judgment Poor  Confusion Mild  Danger to Self  Current suicidal ideation? Denies  Agreement Not to Harm Self Yes  Description of Agreement verbal  Danger to Others  Danger to Others None reported or observed  Danger to Others Abnormal  Harmful Behavior to others No threats or harm toward other people

## 2024-08-26 NOTE — Plan of Care (Signed)
   Problem: Education: Goal: Emotional status will improve Outcome: Progressing   Problem: Education: Goal: Mental status will improve Outcome: Progressing

## 2024-08-26 NOTE — Group Note (Signed)
 Date:  08/26/2024 Time:  5:23 PM  Group Topic/Focus:  Goals Group:   The focus of this group is to help patients establish daily goals to achieve during treatment and discuss how the patient can incorporate goal setting into their daily lives to aide in recovery.    Participation Level:  Did Not Attend  Participation Quality:    Affect:    Cognitive:    Insight:   Engagement in Group:    Modes of Intervention:    Additional Comments:    Bonnielee LOISE Pepper 08/26/2024, 5:23 PM

## 2024-08-26 NOTE — Progress Notes (Signed)
 Patient ID: Eric Pineda, male   DOB: July 26, 1996, 28 y.o.   MRN: [id number] [facility] MD Progress Note  08/26/2024 1:00 PM Eric Pineda  MRN:  [id number]   Subjective:  Chart reviewed, case discussed in multidisciplinary meeting, patient seen during rounds.   12/6: On interview today, patient is found pacing in hallway.  He continues to be minimally engaged in interview and is calm and cooperative.  He is alert and oriented to person.  He is unable to discuss reason for being in hospital.  He continues to display thought blocking and appears to be internally preoccupied.  He denies SI/HI/plan and denies hallucinations.  He denies symptoms of depression or anxiety.  He reports good sleep and appetite.  He does not voice any concerns or complaints at this time.  He is tolerating medication regimen well without adverse effects.  Current medication regimen includes Invega  9 mg once daily and Haldol  5 mg once daily.    Past Psychiatric History: see h&P Family History: History reviewed. No pertinent family history. Social History:  Social History   Substance and Sexual Activity  Alcohol Use Never     Social History   Substance and Sexual Activity  Drug Use Yes    Social History   Socioeconomic History   Marital status: Single    Spouse name: Not on file   Number of children: Not on file   Years of education: Not on file   Highest education level: Not on file  Occupational History   Not on file  Tobacco Use   Smoking status: Never   Smokeless tobacco: Never   Tobacco comments:    Patient denied current use of tobacco  Vaping Use   Vaping status: Never Used  Substance and Sexual Activity   Alcohol use: Never   Drug use: Yes   Sexual activity: Not on file  Other Topics Concern   Not on file  Social History Narrative   Not on file   Social Drivers of Health   Financial Resource Strain: Not on file  Food Insecurity: Food Insecurity Present (08/14/2024)   Hunger Vital Sign     Worried About Running Out of Food in the Last Year: Sometimes true    Ran Out of Food in the Last Year: Never true  Transportation Needs: No Transportation Needs (08/14/2024)   PRAPARE - Administrator, Civil Service (Medical): No    Lack of Transportation (Non-Medical): No  Physical Activity: Not on file  Stress: Not on file  Social Connections: Unknown (01/17/2024)   Social Connection and Isolation Panel    Frequency of Communication with Friends and Family: Patient declined    Frequency of Social Gatherings with Friends and Family: Patient declined    Attends Religious Services: Patient declined    Database Administrator or Organizations: Patient declined    Attends Banker Meetings: Patient declined    Marital Status: Never married   Past Medical History:  Past Medical History:  Diagnosis Date   Bizarre behavior 12/02/2019   History reviewed. No pertinent surgical history.  Current Medications: Current Facility-Administered Medications  Medication Dose Route Frequency Provider Last Rate Last Admin   acetaminophen  (TYLENOL ) tablet 650 mg  650 mg Oral Q6H PRN Bobbitt, Shalon E, NP       alum & mag hydroxide-simeth (MAALOX/MYLANTA) 200-200-20 MG/5ML suspension 30 mL  30 mL Oral Q4H PRN Bobbitt, Shalon E, NP       haloperidol  (HALDOL ) tablet 5 mg  5 mg Oral TID PRN Bobbitt, Shalon E, NP       And   diphenhydrAMINE  (BENADRYL ) capsule 50 mg  50 mg Oral TID PRN Bobbitt, Shalon E, NP       haloperidol  lactate (HALDOL ) injection 5 mg  5 mg Intramuscular TID PRN Bobbitt, Shalon E, NP       And   diphenhydrAMINE  (BENADRYL ) injection 50 mg  50 mg Intramuscular TID PRN Bobbitt, Shalon E, NP       And   LORazepam  (ATIVAN ) injection 2 mg  2 mg Intramuscular TID PRN Bobbitt, Shalon E, NP       haloperidol  lactate (HALDOL ) injection 10 mg  10 mg Intramuscular TID PRN Bobbitt, Shalon E, NP       And   diphenhydrAMINE  (BENADRYL ) injection 50 mg  50 mg Intramuscular TID  PRN Bobbitt, Shalon E, NP       And   LORazepam  (ATIVAN ) injection 2 mg  2 mg Intramuscular TID PRN Bobbitt, Shalon E, NP       haloperidol  (HALDOL ) tablet 5 mg  5 mg Oral Daily Smith, Annie B, NP   5 mg at 08/26/24 9161   hydrOXYzine  (ATARAX ) tablet 25 mg  25 mg Oral TID PRN Bobbitt, Shalon E, NP       magnesium  hydroxide (MILK OF MAGNESIA) suspension 30 mL  30 mL Oral Daily PRN Bobbitt, Shalon E, NP       paliperidone  (INVEGA ) 24 hr tablet 9 mg  9 mg Oral Daily Montague, Hiroshi Krummel J, NP   9 mg at 08/26/24 9161   traZODone  (DESYREL ) tablet 50 mg  50 mg Oral QHS PRN Bobbitt, Shalon E, NP   50 mg at 08/21/24 2119    Lab Results:  No results found for this or any previous visit (from the past 48 hours).   Blood Alcohol level:  Lab Results  Component Value Date   [facility] <15 08/13/2024   ETH <15 01/14/2024    Metabolic Disorder Labs: Lab Results  Component Value Date   HGBA1C 5.1 08/21/2024   MPG 100 08/21/2024   MPG 96.8 01/18/2023   No results found for: PROLACTIN Lab Results  Component Value Date   CHOL 155 08/21/2024   TRIG 73 08/21/2024   HDL 46 08/21/2024   CHOLHDL 3.4 08/21/2024   VLDL 15 08/21/2024   LDLCALC 95 08/21/2024   LDLCALC 81 01/18/2023    Physical Findings: AIMS:  , ,  ,  ,    CIWA:    COWS:      Psychiatric Specialty Exam:  Presentation  General Appearance:  Appropriate for Environment  Eye Contact: Fleeting  Speech: Clear and Coherent  Speech Volume: Normal    Mood and Affect  Mood: Euthymic  Affect: Constricted   Thought Process  Thought Processes: Other (comment) (Thought blocking)  Orientation:Partial (Oriented to person; unable to give date, location, or situation)  Thought Content:Scattered  Hallucinations: Denies Ideas of Reference:None  Suicidal Thoughts: Denies Homicidal Thoughts: Denies  Sensorium  Memory: Immediate Fair  Judgment: Impaired  Insight: Lacking   Art Therapist   Concentration: Fair  Attention Span: Fair  Recall: [employer] of Knowledge: Poor  Language: Poor   Psychomotor Activity  Psychomotor Activity: Normal Musculoskeletal: Strength & Muscle Tone: within normal limits Gait & Station: normal Assets  Assets: Social Support    Physical Exam: Physical Exam Pulmonary:     Effort: Pulmonary effort is normal.  Neurological:     Mental Status: He  is alert.    Review of Systems  Respiratory:  Negative for shortness of breath.   Cardiovascular:  Negative for chest pain.  Psychiatric/Behavioral:  Negative for suicidal ideas.    Blood pressure 127/83, pulse 68, temperature 97.7 F (36.5 C), temperature source Oral, resp. rate 16, height 5' 10 (1.778 m), weight 75.8 kg, SpO2 99%. Body mass index is 23.96 kg/m.  Diagnosis: Principal Problem:   Undifferentiated schizophrenia (HCC)   PLAN: Safety and Monitoring:  -- Voluntary admission to inpatient psychiatric unit for safety, stabilization and treatment  -- Daily contact with patient to assess and evaluate symptoms and progress in treatment  -- Patient's case to be discussed in multi-disciplinary team meeting  -- Observation Level : q15 minute checks  -- Vital signs:  q12 hours  -- Precautions: suicide, elopement, and assault -- Encouraged patient to participate in unit milieu and in scheduled group therapies  2. Psychiatric Treatment:  Scheduled Medications: - Continue Invega  9 mg once daily  - Increase Haldol  to 5 mg twice daily to target continued psychosis symptoms including thought blocking and internal preoccupation  - Consider LAI after dose optimization to help promote medication adherence     -- The risks/benefits/side-effects/alternatives to this medication were discussed in detail with the patient and time was given for questions. The patient consents to medication trial.  3. Medical Issues Being Addressed:  No acute concerns.  4. Discharge Planning:   --  Social work and case management to assist with discharge planning and identification of hospital follow-up needs prior to discharge  -- Estimated LOS: Unable to estimate at this time  [employer], PA-C This note was created using Scientist, clinical (histocompatibility and immunogenetics). Please excuse any inadvertent transcription errors. Case was discussed with supervising physician Dr. Jadapalle who is agreeable with current plan.

## 2024-08-26 NOTE — Group Note (Signed)
 Date:  08/26/2024 Time:  6:47 PM  Group Topic/Focus:  Wellness Toolbox:   The focus of this group is to discuss various aspects of wellness, balancing those aspects and exploring ways to increase the ability to experience wellness.  Patients will create a wellness toolbox for use upon discharge.    Participation Level:  Active  Participation Quality:  Appropriate  Affect:  Appropriate  Cognitive:  Appropriate  Insight: Appropriate  Engagement in Group:  Engaged  Modes of Intervention:  Activity  Additional Comments:    [name] [name] Ferris Fielden 08/26/2024, 6:47 PM

## 2024-08-27 NOTE — Progress Notes (Incomplete)
 Patient ID: Eric Pineda, male   DOB: [birth year], 28 y.o.   MRN: [id number] [facility] MD Progress Note  08/27/2024 12:56 PM Eric Pineda  MRN:  [id number]   Subjective:  Chart reviewed, case discussed in multidisciplinary meeting, patient seen during rounds.   12/7: On interview today, patient is found pacing in the hallway.  He is minimally engaged in interview. He reports tolerating medication regimen well without adverse effects.  He reports stable sleep and appetite. He denies SI/HI/plan and denies hallucinations.  He does not voice any concerns or complaints at this time.  He continues to display thought blocking and appears internally preoccupied. Provider discussed plan of care with patient's sister, Mirza Fessel, and mother, [name]. Social work team has made APS report.  12/6: On interview today, patient is found pacing in hallway.  He continues to be minimally engaged in interview and is calm and cooperative.  He is alert and oriented to person.  He is unable to discuss reason for being in hospital.  He continues to display thought blocking and appears to be internally preoccupied.  He denies SI/HI/plan and denies hallucinations.  He denies symptoms of depression or anxiety.  He reports good sleep and appetite.  He does not voice any concerns or complaints at this time.  He is tolerating medication regimen well without adverse effects.  Current medication regimen includes Invega  9 mg once daily and Haldol  5 mg once daily.    Past Psychiatric History: see h&P Family History: History reviewed. No pertinent family history. Social History:  Social History   Substance and Sexual Activity  Alcohol Use Never     Social History   Substance and Sexual Activity  Drug Use Yes    Social History   Socioeconomic History   Marital status: Single    Spouse name: Not on file   Number of children: Not on file   Years of education: Not on file   Highest education level: Not on file   Occupational History   Not on file  Tobacco Use   Smoking status: Never   Smokeless tobacco: Never   Tobacco comments:    Patient denied current use of tobacco  Vaping Use   Vaping status: Never Used  Substance and Sexual Activity   Alcohol use: Never   Drug use: Yes   Sexual activity: Not on file  Other Topics Concern   Not on file  Social History Narrative   Not on file   Social Drivers of Health   Financial Resource Strain: Not on file  Food Insecurity: Food Insecurity Present (08/14/2024)   Hunger Vital Sign    Worried About Running Out of Food in the Last Year: Sometimes true    Ran Out of Food in the Last Year: Never true  Transportation Needs: No Transportation Needs (08/14/2024)   PRAPARE - Administrator, Civil Service (Medical): No    Lack of Transportation (Non-Medical): No  Physical Activity: Not on file  Stress: Not on file  Social Connections: Unknown (01/17/2024)   Social Connection and Isolation Panel    Frequency of Communication with Friends and Family: Patient declined    Frequency of Social Gatherings with Friends and Family: Patient declined    Attends Religious Services: Patient declined    Database Administrator or Organizations: Patient declined    Attends Banker Meetings: Patient declined    Marital Status: Never married   Past Medical History:  Past Medical History:  Diagnosis Date   Bizarre behavior 12/02/2019   History reviewed. No pertinent surgical history.  Current Medications: Current Facility-Administered Medications  Medication Dose Route Frequency Provider Last Rate Last Admin   acetaminophen  (TYLENOL ) tablet 650 mg  650 mg Oral Q6H PRN Bobbitt, Shalon E, NP       alum & mag hydroxide-simeth (MAALOX/MYLANTA) 200-200-20 MG/5ML suspension 30 mL  30 mL Oral Q4H PRN Bobbitt, Shalon E, NP       haloperidol  (HALDOL ) tablet 5 mg  5 mg Oral TID PRN Bobbitt, Shalon E, NP       And   diphenhydrAMINE  (BENADRYL )  capsule 50 mg  50 mg Oral TID PRN Bobbitt, Shalon E, NP       haloperidol  lactate (HALDOL ) injection 5 mg  5 mg Intramuscular TID PRN Bobbitt, Shalon E, NP       And   diphenhydrAMINE  (BENADRYL ) injection 50 mg  50 mg Intramuscular TID PRN Bobbitt, Shalon E, NP       And   LORazepam  (ATIVAN ) injection 2 mg  2 mg Intramuscular TID PRN Bobbitt, Shalon E, NP       haloperidol  lactate (HALDOL ) injection 10 mg  10 mg Intramuscular TID PRN Bobbitt, Shalon E, NP       And   diphenhydrAMINE  (BENADRYL ) injection 50 mg  50 mg Intramuscular TID PRN Bobbitt, Shalon E, NP       And   LORazepam  (ATIVAN ) injection 2 mg  2 mg Intramuscular TID PRN Bobbitt, Shalon E, NP       haloperidol  (HALDOL ) tablet 5 mg  5 mg Oral BID Tokiko Diefenderfer L, PA-C   5 mg at 08/27/24 9182   hydrOXYzine  (ATARAX ) tablet 25 mg  25 mg Oral TID PRN Bobbitt, Shalon E, NP       magnesium  hydroxide (MILK OF MAGNESIA) suspension 30 mL  30 mL Oral Daily PRN Bobbitt, Shalon E, NP       paliperidone  (INVEGA ) 24 hr tablet 9 mg  9 mg Oral Daily Montague, Dontez Hauss J, NP   9 mg at 08/27/24 0818   traZODone  (DESYREL ) tablet 50 mg  50 mg Oral QHS PRN Bobbitt, Shalon E, NP   50 mg at 08/21/24 2119    Lab Results:  No results found for this or any previous visit (from the past 48 hours).   Blood Alcohol level:  Lab Results  Component Value Date   [facility] <15 08/13/2024   ETH <15 01/14/2024    Metabolic Disorder Labs: Lab Results  Component Value Date   HGBA1C 5.1 08/21/2024   MPG 100 08/21/2024   MPG 96.8 01/18/2023   No results found for: PROLACTIN Lab Results  Component Value Date   CHOL 155 08/21/2024   TRIG 73 08/21/2024   HDL 46 08/21/2024   CHOLHDL 3.4 08/21/2024   VLDL 15 08/21/2024   LDLCALC 95 08/21/2024   LDLCALC 81 01/18/2023    Physical Findings: AIMS:  , ,  ,  ,    CIWA:    COWS:      Psychiatric Specialty Exam:  Presentation  General Appearance:  Appropriate for Environment  Eye  Contact: Fleeting  Speech: Clear and Coherent  Speech Volume: Normal    Mood and Affect  Mood: Euthymic  Affect: Constricted   Thought Process  Thought Processes: Other (comment) (Thought blocking)  Orientation:Partial (Oriented to person; unable to give date, location, or situation)  Thought Content:Scattered  Hallucinations: Denies Ideas of Reference:None  Suicidal Thoughts: Denies Homicidal Thoughts:  Denies  Sensorium  Memory: Immediate Fair  Judgment: Impaired  Insight: Lacking   Art Therapist  Concentration: Fair  Attention Span: Fair  Recall: [employer] of Knowledge: Poor  Language: Poor   Psychomotor Activity  Psychomotor Activity: Normal Musculoskeletal: Strength & Muscle Tone: within normal limits Gait & Station: normal Assets  Assets: Social Support    Physical Exam: Physical Exam Pulmonary:     Effort: Pulmonary effort is normal.  Neurological:     Mental Status: He is alert.    Review of Systems  Respiratory:  Negative for shortness of breath.   Cardiovascular:  Negative for chest pain.  Psychiatric/Behavioral:  Negative for suicidal ideas.    Blood pressure (!) 127/91, pulse 69, temperature 97.7 F (36.5 C), temperature source Oral, resp. rate 17, height 5' 10 (1.778 m), weight 75.8 kg, SpO2 99%. Body mass index is 23.96 kg/m.  Diagnosis: Principal Problem:   Undifferentiated schizophrenia (HCC)   PLAN: Safety and Monitoring:  -- Voluntary admission to inpatient psychiatric unit for safety, stabilization and treatment  -- Daily contact with patient to assess and evaluate symptoms and progress in treatment  -- Patient's case to be discussed in multi-disciplinary team meeting  -- Observation Level : q15 minute checks  -- Vital signs:  q12 hours  -- Precautions: suicide, elopement, and assault -- Encouraged patient to participate in unit milieu and in scheduled group therapies  2. Psychiatric  Treatment:  Scheduled Medications: - Continue Invega  9 mg once daily  - Continue Haldol  5 mg twice daily  - Consider LAI after dose optimization to help promote medication adherence     -- The risks/benefits/side-effects/alternatives to this medication were discussed in detail with the patient and time was given for questions. The patient consents to medication trial.  3. Medical Issues Being Addressed:  No acute concerns.  4. Discharge Planning:   -- Social work and case management to assist with discharge planning and identification of hospital follow-up needs prior to discharge  -- Estimated LOS: Unable to estimate at this time  [employer], PA-C This note was created using Scientist, clinical (histocompatibility and immunogenetics). Please excuse any inadvertent transcription errors. Case was discussed with attending physician Dr. Jadapalle who is agreeable with current plan.

## 2024-08-27 NOTE — Group Note (Signed)
 Date:  08/27/2024 Time:  11:09 PM  Group Topic/Focus:  Wrap-Up Group:   The focus of this group is to help patients review their daily goal of treatment and discuss progress on daily workbooks.    Participation Level:  Did Not Attend   Eric Pineda 08/27/2024, 11:09 PM

## 2024-08-27 NOTE — Progress Notes (Signed)
   08/27/24 2000  Psych Admission Type (Psych Patients Only)  Admission Status Involuntary  Psychosocial Assessment  Patient Complaints None (Patient states I'm good when this nurse is attempting to engage in conversation.)  Eye Contact Brief  Facial Expression Flat  Affect Preoccupied  Speech Soft  Interaction Minimal (Patient paces in the hallway but does not participate in groups nor does he engage with peers or staff)  Motor Activity Pacing  Appearance/Hygiene Unremarkable;In scrubs  Behavior Characteristics Pacing;Guarded  Mood Preoccupied  Thought Process  Coherency Blocking  Content Preoccupation  Delusions None reported or observed  Perception UTA  Hallucination UTA (Patient denying AVH but appears to be internally preoccupied)  Judgment Impaired  Confusion Moderate  Danger to Self  Current suicidal ideation? Denies  Self-Injurious Behavior No self-injurious ideation or behavior indicators observed or expressed   Danger to Others  Danger to Others None reported or observed  Danger to Others Abnormal  Harmful Behavior to others No threats or harm toward other people  Destructive Behavior No threats or harm toward property

## 2024-08-27 NOTE — Progress Notes (Signed)
   08/27/24 0836  Psych Admission Type (Psych Patients Only)  Admission Status Involuntary  Psychosocial Assessment  Patient Complaints None  Eye Contact Brief  Facial Expression Flat  Affect Preoccupied  Speech Soft  Interaction Minimal;Isolative  Motor Activity Other (Comment) (WDL)  Appearance/Hygiene Unremarkable  Behavior Characteristics Guarded  Mood Preoccupied  Aggressive Behavior  Effect No apparent injury  Thought Process  Coherency Blocking  Content Preoccupation  Delusions None reported or observed  Perception WDL  Hallucination None reported or observed  Judgment Impaired  Confusion WDL  Danger to Self  Current suicidal ideation? Denies  Self-Injurious Behavior No self-injurious ideation or behavior indicators observed or expressed   Agreement Not to Harm Self Yes  Description of Agreement verbal  Danger to Others  Danger to Others None reported or observed  Danger to Others Abnormal  Harmful Behavior to others No threats or harm toward other people

## 2024-08-27 NOTE — Group Note (Signed)
 LCSW Group Therapy Note  Group Date: 08/27/2024 Start Time: 1510 End Time: 1550   Type of Therapy and Topic:  Group Therapy - Healthy vs Unhealthy Coping Skills  Participation Level:  Did Not Attend   Description of Group The focus of this group was to determine what unhealthy coping techniques typically are used by group members and what healthy coping techniques would be helpful in coping with various problems. Patients were guided in becoming aware of the differences between healthy and unhealthy coping techniques. Patients were asked to identify 2-3 healthy coping skills they would like to learn to use more effectively.  Therapeutic Goals Patients learned that coping is what human beings do all day long to deal with various situations in their lives Patients defined and discussed healthy vs unhealthy coping techniques Patients identified their preferred coping techniques and identified whether these were healthy or unhealthy Patients determined 2-3 healthy coping skills they would like to become more familiar with and use more often. Patients provided support and ideas to each other   Summary of Patient Progress:  Patient did not attend group.  Roselyn GORMAN Lento, LCSWA 08/27/2024  4:48 PM

## 2024-08-27 NOTE — Plan of Care (Signed)
  Problem: Education: Goal: Emotional status will improve Outcome: Not Progressing Goal: Mental status will improve Outcome: Not Progressing   Problem: Activity: Goal: Interest or engagement in activities will improve Outcome: Not Progressing   Problem: Health Behavior/Discharge Planning: Goal: Compliance with treatment plan for underlying cause of condition will improve Outcome: Not Progressing   Problem: Physical Regulation: Goal: Ability to maintain clinical measurements within normal limits will improve Outcome: Progressing   Problem: Safety: Goal: Periods of time without injury will increase Outcome: Progressing

## 2024-08-27 NOTE — Group Note (Signed)
 Date:  08/27/2024 Time:  10:13 AM  Group Topic/Focus:  Goals Group:   The focus of this group is to help patients establish daily goals to achieve during treatment and discuss how the patient can incorporate goal setting into their daily lives to aide in recovery.    Participation Level:  Did Not Attend  Leigh VEAR Pais 08/27/2024, 10:13 AM

## 2024-08-27 NOTE — Plan of Care (Signed)
  Problem: Activity: Goal: Sleeping patterns will improve Outcome: Progressing   

## 2024-08-27 NOTE — Plan of Care (Signed)

## 2024-08-28 NOTE — Progress Notes (Signed)
   08/28/24 2300  Psych Admission Type (Psych Patients Only)  Admission Status Involuntary  Psychosocial Assessment  Patient Complaints None  Eye Contact Avoids  Facial Expression Flat  Affect Preoccupied  Speech Soft  Interaction Minimal  Motor Activity Pacing  Appearance/Hygiene Unremarkable  Behavior Characteristics Pacing  Mood Preoccupied  Aggressive Behavior  Effect No apparent injury  Thought Process  Coherency Blocking  Content Preoccupation  Delusions None reported or observed  Perception WDL  Hallucination None reported or observed  Judgment Impaired  Confusion Mild  Danger to Self  Current suicidal ideation? Denies  Self-Injurious Behavior No self-injurious ideation or behavior indicators observed or expressed   Agreement Not to Harm Self Yes  Danger to Others  Danger to Others None reported or observed  Danger to Others Abnormal  Harmful Behavior to others No threats or harm toward other people

## 2024-08-28 NOTE — Group Note (Signed)
 Recreation Therapy Group Note   Group Topic:Coping Skills  Group Date: 08/28/2024 Start Time: 1040 End Time: 1120 Facilitators: Celestia Jeoffrey BRAVO, LRT, CTRS Location: Craft Room  Group Description: Mind Map.  Patient was provided a blank template of a diagram with 32 blank boxes in a tiered system, branching from the center (similar to a bubble chart). LRT directed patients to label the middle of the diagram Coping Skills. LRT and patients then came up with 8 different coping skills as examples. Pt were directed to record their coping skills in the 2nd tier boxes closest to the center.  Patients would then share their coping skills with the group as LRT wrote them out. LRT gave a handout of 99 different coping skills at the end of group.   Goal Area(s) Addressed: Patients will be able to define "coping skills". Patient will identify new coping skills.  Patient will increase communication.   Affect/Mood: N/A   Participation Level: Did not attend    Clinical Observations/Individualized Feedback: Patient did not attend group.   Plan: Continue to engage patient in RT group sessions 2-3x/week.   Jeoffrey BRAVO Celestia, LRT, CTRS 08/28/2024 11:45 AM

## 2024-08-28 NOTE — Progress Notes (Signed)
 Patient ID: Eric Pineda, male   DOB: [birth year], 28 y.o.   MRN: [id number] [facility] MD Progress Note  08/28/2024 12:00 PM Eric Pineda  MRN:  [id number]   Subjective:  Chart reviewed, case discussed in multidisciplinary meeting, patient seen during rounds.   12/8: On interview today, patient is found pacing in the hallway.  He continues to be minimally engaged in interview.  No unsafe behaviors have been reported.  He continues to display thought blocking and appears internally preoccupied.  Patient reports tolerating medication regimen well without adverse effects.  Will plan to increase Haldol  to 10 mg twice daily starting tomorrow.  He denies SI/HI/plan and denies hallucinations.  He reports good sleep and appetite.  Patient then walks away during interview stating okay thank you.  12/7: On interview today, patient is found pacing in the hallway.  He is minimally engaged in interview. He reports tolerating medication regimen well without adverse effects.  He reports stable sleep and appetite. He denies SI/HI/plan and denies hallucinations.  He does not voice any concerns or complaints at this time.  He continues to display thought blocking and appears internally preoccupied. Provider discussed plan of care with patient's sister, Eric Pineda, and mother, Eric Pineda. Social work team has made APS report.  12/6: On interview today, patient is found pacing in hallway.  He continues to be minimally engaged in interview and is calm and cooperative.  He is alert and oriented to person.  He is unable to discuss reason for being in hospital.  He continues to display thought blocking and appears to be internally preoccupied.  He denies SI/HI/plan and denies hallucinations.  He denies symptoms of depression or anxiety.  He reports good sleep and appetite.  He does not voice any concerns or complaints at this time.  He is tolerating medication regimen well without adverse effects.  Current medication regimen  includes Invega  9 mg once daily and Haldol  5 mg once daily.    Past Psychiatric History: see h&P Family History: History reviewed. No pertinent family history. Social History:  Social History   Substance and Sexual Activity  Alcohol Use Never     Social History   Substance and Sexual Activity  Drug Use Yes    Social History   Socioeconomic History   Marital status: Single    Spouse name: Not on file   Number of children: Not on file   Years of education: Not on file   Highest education level: Not on file  Occupational History   Not on file  Tobacco Use   Smoking status: Never   Smokeless tobacco: Never   Tobacco comments:    Patient denied current use of tobacco  Vaping Use   Vaping status: Never Used  Substance and Sexual Activity   Alcohol use: Never   Drug use: Yes   Sexual activity: Not on file  Other Topics Concern   Not on file  Social History Narrative   Not on file   Social Drivers of Health   Financial Resource Strain: Not on file  Food Insecurity: Food Insecurity Present (08/14/2024)   Hunger Vital Sign    Worried About Running Out of Food in the Last Year: Sometimes true    Ran Out of Food in the Last Year: Never true  Transportation Needs: No Transportation Needs (08/14/2024)   PRAPARE - Administrator, Civil Service (Medical): No    Lack of Transportation (Non-Medical): No  Physical Activity: Not on file  Stress: Not on file  Social Connections: Unknown (01/17/2024)   Social Connection and Isolation Panel    Frequency of Communication with Friends and Family: Patient declined    Frequency of Social Gatherings with Friends and Family: Patient declined    Attends Religious Services: Patient declined    Database Administrator or Organizations: Patient declined    Attends Banker Meetings: Patient declined    Marital Status: Never married   Past Medical History:  Past Medical History:  Diagnosis Date   Bizarre behavior  12/02/2019   History reviewed. No pertinent surgical history.  Current Medications: Current Facility-Administered Medications  Medication Dose Route Frequency Provider Last Rate Last Admin   acetaminophen  (TYLENOL ) tablet 650 mg  650 mg Oral Q6H PRN Bobbitt, Shalon E, NP       alum & mag hydroxide-simeth (MAALOX/MYLANTA) 200-200-20 MG/5ML suspension 30 mL  30 mL Oral Q4H PRN Bobbitt, Shalon E, NP       haloperidol  (HALDOL ) tablet 5 mg  5 mg Oral TID PRN Bobbitt, Shalon E, NP       And   diphenhydrAMINE  (BENADRYL ) capsule 50 mg  50 mg Oral TID PRN Bobbitt, Shalon E, NP       haloperidol  lactate (HALDOL ) injection 5 mg  5 mg Intramuscular TID PRN Bobbitt, Shalon E, NP       And   diphenhydrAMINE  (BENADRYL ) injection 50 mg  50 mg Intramuscular TID PRN Bobbitt, Shalon E, NP       And   LORazepam  (ATIVAN ) injection 2 mg  2 mg Intramuscular TID PRN Bobbitt, Shalon E, NP       haloperidol  lactate (HALDOL ) injection 10 mg  10 mg Intramuscular TID PRN Bobbitt, Shalon E, NP       And   diphenhydrAMINE  (BENADRYL ) injection 50 mg  50 mg Intramuscular TID PRN Bobbitt, Shalon E, NP       And   LORazepam  (ATIVAN ) injection 2 mg  2 mg Intramuscular TID PRN Bobbitt, Shalon E, NP       haloperidol  (HALDOL ) tablet 5 mg  5 mg Oral BID Oceanna Arruda L, PA-C   5 mg at 08/28/24 9163   hydrOXYzine  (ATARAX ) tablet 25 mg  25 mg Oral TID PRN Bobbitt, Shalon E, NP       magnesium  hydroxide (MILK OF MAGNESIA) suspension 30 mL  30 mL Oral Daily PRN Bobbitt, Shalon E, NP       paliperidone  (INVEGA ) 24 hr tablet 9 mg  9 mg Oral Daily Montague, Garcia Dalzell J, NP   9 mg at 08/28/24 0836   traZODone  (DESYREL ) tablet 50 mg  50 mg Oral QHS PRN Bobbitt, Shalon E, NP   50 mg at 08/21/24 2119    Lab Results:  No results found for this or any previous visit (from the past 48 hours).   Blood Alcohol level:  Lab Results  Component Value Date   [facility] <15 08/13/2024   ETH <15 01/14/2024    Metabolic Disorder Labs: Lab Results   Component Value Date   HGBA1C 5.1 08/21/2024   MPG 100 08/21/2024   MPG 96.8 01/18/2023   No results found for: PROLACTIN Lab Results  Component Value Date   CHOL 155 08/21/2024   TRIG 73 08/21/2024   HDL 46 08/21/2024   CHOLHDL 3.4 08/21/2024   VLDL 15 08/21/2024   LDLCALC 95 08/21/2024   LDLCALC 81 01/18/2023    Physical Findings: AIMS:  , ,  ,  ,  CIWA:    COWS:      Psychiatric Specialty Exam:  Presentation  General Appearance:  Appropriate for Environment  Eye Contact: Fleeting  Speech: Clear and Coherent  Speech Volume: Normal    Mood and Affect  Mood: Euthymic  Affect: Constricted   Thought Process  Thought Processes: Other (comment) (Thought blocking)  Orientation:Partial (Oriented to person; unable to give date, location, or situation)  Thought Content:Scattered  Hallucinations: Denies Ideas of Reference:None  Suicidal Thoughts: Denies Homicidal Thoughts: Denies  Sensorium  Memory: Immediate Fair  Judgment: Impaired  Insight: Lacking   Executive Functions  Concentration: Fair  Attention Span: Fair  Recall: Fair  Fund of Knowledge: Poor  Language: Poor   Psychomotor Activity  Psychomotor Activity: Normal Musculoskeletal: Strength & Muscle Tone: within normal limits Gait & Station: normal Assets  Assets: Social Support    Physical Exam: Physical Exam Pulmonary:     Effort: Pulmonary effort is normal.  Neurological:     Mental Status: He is alert.    Review of Systems  Respiratory:  Negative for shortness of breath.   Cardiovascular:  Negative for chest pain.  Psychiatric/Behavioral:  Negative for suicidal ideas.    Blood pressure 115/89, pulse 81, temperature 97.7 F (36.5 C), temperature source Oral, resp. rate 18, height 5' 10 (1.778 m), weight 75.8 kg, SpO2 99%. Body mass index is 23.96 kg/m.  Diagnosis: Principal Problem:   Undifferentiated schizophrenia (HCC)   PLAN: Safety  and Monitoring:  -- Voluntary admission to inpatient psychiatric unit for safety, stabilization and treatment  -- Daily contact with patient to assess and evaluate symptoms and progress in treatment  -- Patient's case to be discussed in multi-disciplinary team meeting  -- Observation Level : q15 minute checks  -- Vital signs:  q12 hours  -- Precautions: suicide, elopement, and assault -- Encouraged patient to participate in unit milieu and in scheduled group therapies  2. Psychiatric Treatment:  Scheduled Medications: - Continue Invega  9 mg once daily  - Increase Haldol  to 10 mg twice daily starting tomorrow  - Consider LAI after dose optimization to help promote medication adherence     -- The risks/benefits/side-effects/alternatives to this medication were discussed in detail with the patient and time was given for questions. The patient consents to medication trial.  3. Medical Issues Being Addressed:  No acute concerns.  4. Discharge Planning:   -- Social work and case management to assist with discharge planning and identification of hospital follow-up needs prior to discharge  -- Estimated LOS: Unable to estimate at this time  [employer], PA-C This note was created using Scientist, clinical (histocompatibility and immunogenetics). Please excuse any inadvertent transcription errors. Case was discussed with attending physician Dr. Jadapalle who is agreeable with current plan.

## 2024-08-28 NOTE — Group Note (Signed)
 Recreation Therapy Group Note   Group Topic:Other  Group Date: 08/28/2024 Start Time: 1515 End Time: 1605 Facilitators: Celestia Jeoffrey FORBES ARTICE, CTRS Location: Craft Room  Activity Description/Intervention: Therapeutic Drumming. Patients with peers and staff were given the opportunity to engage in a leader facilitated [facility] Group Empowerment Drumming Circle with staff from the [employer], in partnership with [employer]. Teaching laboratory technician and trained [employer], Norleen Mon leading with LRT observing and documenting intervention and pt response. This evidenced-based practice targets 7 areas of health and wellbeing in the human experience including: stress-reduction, exercise, self-expression, camaraderie/support, nurturing, spirituality, and music-making (leisure).    Goal Area(s) Addresses:  Patient will engage in pro-social way in music group.  Patient will follow directions of drum leader on the first prompt. Patient will demonstrate no behavioral issues during group.  Patient will identify if a reduction in stress level occurs as a result of participation in therapeutic drum circle.     Affect/Mood: N/A   Participation Level: Did not attend    Clinical Observations/Individualized Feedback: Patient did not attend group.   Plan: Continue to engage patient in RT group sessions 2-3x/week.   [address], LRT, CTRS 08/28/2024 5:25 PM

## 2024-08-28 NOTE — Plan of Care (Signed)
  Problem: Education: Goal: Emotional status will improve Outcome: Progressing Goal: Verbalization of understanding the information provided will improve Outcome: Progressing   Problem: Activity: Goal: Sleeping patterns will improve Outcome: Progressing   Problem: Coping: Goal: Ability to verbalize frustrations and anger appropriately will improve Outcome: Progressing Goal: Ability to demonstrate self-control will improve Outcome: Progressing   Problem: Health Behavior/Discharge Planning: Goal: Compliance with treatment plan for underlying cause of condition will improve Outcome: Progressing   Problem: Education: Goal: Mental status will improve Outcome: Not Progressing   Problem: Activity: Goal: Interest or engagement in activities will improve Outcome: Not Progressing

## 2024-08-28 NOTE — Progress Notes (Signed)
   08/28/24 0920  Psychosocial Assessment  Patient Complaints None  Eye Contact Avoids  Facial Expression Flat  Affect Preoccupied  Speech Soft  Interaction Minimal;Isolative  Motor Activity Other (Comment) (WDL)  Appearance/Hygiene Unremarkable  Behavior Characteristics Pacing;Guarded  Mood Preoccupied  Aggressive Behavior  Effect No apparent injury  Thought Process  Coherency Blocking  Content Preoccupation  Delusions None reported or observed  Perception WDL  Hallucination None reported or observed  Judgment Impaired  Confusion Mild  Danger to Self  Current suicidal ideation? Denies  Self-Injurious Behavior No self-injurious ideation or behavior indicators observed or expressed   Agreement Not to Harm Self Yes  Description of Agreement verbal  Danger to Others  Danger to Others None reported or observed  Danger to Others Abnormal  Harmful Behavior to others No threats or harm toward other people

## 2024-08-28 NOTE — Group Note (Signed)
 [facility] LCSW Group Therapy Note   Group Date: 08/28/2024 Start Time: 1300 End Time: 1400   Type of Therapy/Topic:  Group Therapy:  Balance in Life  Participation Level:  Did Not Attend   Description of Group:    This group will address the concept of balance and how it feels and looks when one is unbalanced. Patients will be encouraged to process areas in their lives that are out of balance, and identify reasons for remaining unbalanced. Facilitators will guide patients utilizing problem- solving interventions to address and correct the stressor making their life unbalanced. Understanding and applying boundaries will be explored and addressed for obtaining  and maintaining a balanced life. Patients will be encouraged to explore ways to assertively make their unbalanced needs known to significant others in their lives, using other group members and facilitator for support and feedback.  Therapeutic Goals: Patient will identify two or more emotions or situations they have that consume much of in their lives. Patient will identify signs/triggers that life has become out of balance:  Patient will identify two ways to set boundaries in order to achieve balance in their lives:  Patient will demonstrate ability to communicate their needs through discussion and/or role plays  Summary of Patient Progress:    Patient did not attend.     Therapeutic Modalities:   Cognitive Behavioral Therapy Solution-Focused Therapy Assertiveness Training   Alveta CHRISTELLA Kerns, LCSW

## 2024-08-28 NOTE — Group Note (Signed)
 Date:  08/28/2024 Time:  3:49 PM  Group Topic/Focus:  Crisis Planning:   The purpose of this group is to help patients create a crisis plan for use upon discharge or in the future, as needed.    Participation Level:  Did Not Attend   Eric Pineda 08/28/2024, 3:49 PM

## 2024-08-28 NOTE — Group Note (Signed)
 Date:  08/28/2024 Time:  8:58 PM  Group Topic/Focus:  Orientation:   The focus of this group is to educate the patient on the purpose and policies of crisis stabilization and provide a format to answer questions about their admission.  The group details unit policies and expectations of patients while admitted.    Participation Level:  Did Not Attend  Participation Quality:  none  Affect:  none  Cognitive:  none  Insight: None  Engagement in Group:  none  Modes of Intervention:  none  Additional Comments:  none    Jynesis Nakamura 08/28/2024, 8:58 PM

## 2024-08-29 MED ORDER — HALOPERIDOL 5 MG PO TABS
10.0000 mg | ORAL_TABLET | Freq: Two times a day (BID) | ORAL | Status: DC
  Administered 2024-08-29 – 2024-09-01 (×7): 10 mg via ORAL
  Filled 2024-08-29 (×7): qty 2

## 2024-08-29 NOTE — Plan of Care (Signed)

## 2024-08-29 NOTE — Progress Notes (Signed)
   08/29/24 2000  Psych Admission Type (Psych Patients Only)  Admission Status Involuntary  Psychosocial Assessment  Patient Complaints None  Eye Contact Brief  Facial Expression Flat (Patient intermittently smiles while pacing.)  Affect Preoccupied  Speech Soft  Interaction Minimal  Motor Activity Pacing  Appearance/Hygiene Unremarkable;In scrubs  Behavior Characteristics Pacing;Guarded  Mood Preoccupied;Pleasant  Thought Process  Coherency Blocking  Content Preoccupation  Delusions None reported or observed  Perception UTA  Hallucination UTA (Patient declining AVH; however, he appears to be responding to internal stimuli)  Judgment Impaired  Confusion Mild  Danger to Self  Current suicidal ideation? Denies  Self-Injurious Behavior No self-injurious ideation or behavior indicators observed or expressed   Danger to Others  Danger to Others None reported or observed  Danger to Others Abnormal  Harmful Behavior to others No threats or harm toward other people  Destructive Behavior No threats or harm toward property

## 2024-08-29 NOTE — Group Note (Signed)
 Recreation Therapy Group Note   Group Topic:Healthy Support Systems  Group Date: 08/29/2024 Start Time: 1000 End Time: 1040 Facilitators: Celestia Jeoffrey BRAVO, LRT, CTRS Location: Craft Room  Group Description: Straw Bridge. In groups or individually, patients were given 10 plastic drinking straws and an equal length of masking tape. Using the materials provided, patients were instructed to build a free-standing bridge-like structure to suspend an everyday item (ex: deck of cards) off the floor or table surface. All materials were required to be used in secondary school teacher. LRT facilitated post-activity discussion reviewing the importance of having strong and healthy support systems in our lives. LRT discussed how the people in our lives serve as the tape and the deck of cards we placed on top of our straw structure are the stressors we face in daily life. LRT and pts discussed what happens in our life when things get too heavy for us , and we don't have strong supports outside of the hospital. Pt shared 2 of their healthy supports in their life aloud in the group.   Goal Area(s) Addressed:  Patient will identify 2 healthy supports in their life. Patient will identify skills to successfully complete activity. Patient will identify correlation of this activity to life post-discharge.  Patient will build on frustration tolerance skills. Patient will increase team building and communication skills.    Affect/Mood: N/A   Participation Level: Did not attend    Clinical Observations/Individualized Feedback: Patient did not attend group.   Plan: Continue to engage patient in RT group sessions 2-3x/week.   Jeoffrey BRAVO Celestia, LRT, CTRS 08/29/2024 11:21 AM

## 2024-08-29 NOTE — B[facility] Counselor (Signed)
 CSW attempted to contact [facility] DSS to assess for status of APS referral.    CSW left HIPAA compliant voicemail requesting a return phone call.  Sherryle Margo, MSW, LCSW 08/29/2024 3:02 PM

## 2024-08-29 NOTE — Group Note (Signed)
 [facility] LCSW Group Therapy Note   Group Date: 08/29/2024 Start Time: 1315 End Time: 1400  Type of Therapy/Topic:  Group Therapy:  Feelings about Diagnosis  Participation Level:  Did Not Attend   Description of Group:    This group will allow patients to explore their thoughts and feelings about diagnoses they have received. Patients will be guided to explore their level of understanding and acceptance of these diagnoses. Facilitator will encourage patients to process their thoughts and feelings about the reactions of others to their diagnosis, and will guide patients in identifying ways to discuss their diagnosis with significant others in their lives. This group will be process-oriented, with patients participating in exploration of their own experiences as well as giving and receiving support and challenge from other group members.   Therapeutic Goals: 1. Patient will demonstrate understanding of diagnosis as evidence by identifying two or more symptoms of the disorder:  2. Patient will be able to express two feelings regarding the diagnosis 3. Patient will demonstrate ability to communicate their needs through discussion and/or role plays  Summary of Patient Progress: Patient declined to attend group.     Therapeutic Modalities:   Cognitive Behavioral Therapy Brief Therapy Feelings Identification    Sherryle JINNY Margo, LCSW

## 2024-08-29 NOTE — Progress Notes (Signed)
 Patient ID: Eric Pineda, male   DOB: 08/01/1996, 28 y.o.   MRN: [id number] [facility] MD Progress Note  08/29/2024 11:34 PM Eric Pineda  MRN:  [id number]   Subjective:  Chart reviewed, case discussed in multidisciplinary meeting, patient seen during rounds.  12/9: Patient is noted to be walking in the hallway and continues to pace.  Patient does not stop to talk to the provider and does not respond to questions related to mental health.  Patient is not endorsing SI/HI and is not displaying any unsafe behaviors.  Patient is noted to be responding to stimuli intermittently. 12/8: On interview today, patient is found pacing in the hallway.  He continues to be minimally engaged in interview.  No unsafe behaviors have been reported.  He continues to display thought blocking and appears internally preoccupied.  Patient reports tolerating medication regimen well without adverse effects.  Will plan to increase Haldol  to 10 mg twice daily starting tomorrow.  He denies SI/HI/plan and denies hallucinations.  He reports good sleep and appetite.  Patient then walks away during interview stating okay thank you.  12/7: On interview today, patient is found pacing in the hallway.  He is minimally engaged in interview. He reports tolerating medication regimen well without adverse effects.  He reports stable sleep and appetite. He denies SI/HI/plan and denies hallucinations.  He does not voice any concerns or complaints at this time.  He continues to display thought blocking and appears internally preoccupied. Provider discussed plan of care with patient's sister, Eric Pineda, and mother, Eric Pineda. Social work team has made APS report.  12/6: On interview today, patient is found pacing in hallway.  He continues to be minimally engaged in interview and is calm and cooperative.  He is alert and oriented to person.  He is unable to discuss reason for being in hospital.  He continues to display thought blocking and  appears to be internally preoccupied.  He denies SI/HI/plan and denies hallucinations.  He denies symptoms of depression or anxiety.  He reports good sleep and appetite.  He does not voice any concerns or complaints at this time.  He is tolerating medication regimen well without adverse effects.  Current medication regimen includes Invega  9 mg once daily and Haldol  5 mg once daily.    Past Psychiatric History: see h&P Family History: History reviewed. No pertinent family history. Social History:  Social History   Substance and Sexual Activity  Alcohol Use Never     Social History   Substance and Sexual Activity  Drug Use Yes    Social History   Socioeconomic History   Marital status: Single    Spouse name: Not on file   Number of children: Not on file   Years of education: Not on file   Highest education level: Not on file  Occupational History   Not on file  Tobacco Use   Smoking status: Never   Smokeless tobacco: Never   Tobacco comments:    Patient denied current use of tobacco  Vaping Use   Vaping status: Never Used  Substance and Sexual Activity   Alcohol use: Never   Drug use: Yes   Sexual activity: Not on file  Other Topics Concern   Not on file  Social History Narrative   Not on file   Social Drivers of Health   Financial Resource Strain: Not on file  Food Insecurity: Food Insecurity Present (08/14/2024)   Hunger Vital Sign    Worried About Running Out  of Food in the Last Year: Sometimes true    Ran Out of Food in the Last Year: Never true  Transportation Needs: No Transportation Needs (08/14/2024)   PRAPARE - Administrator, Civil Service (Medical): No    Lack of Transportation (Non-Medical): No  Physical Activity: Not on file  Stress: Not on file  Social Connections: Unknown (01/17/2024)   Social Connection and Isolation Panel    Frequency of Communication with Friends and Family: Patient declined    Frequency of Social Gatherings with  Friends and Family: Patient declined    Attends Religious Services: Patient declined    Database Administrator or Organizations: Patient declined    Attends Banker Meetings: Patient declined    Marital Status: Never married   Past Medical History:  Past Medical History:  Diagnosis Date   Bizarre behavior 12/02/2019   History reviewed. No pertinent surgical history.  Current Medications: Current Facility-Administered Medications  Medication Dose Route Frequency Provider Last Rate Last Admin   acetaminophen  (TYLENOL ) tablet 650 mg  650 mg Oral Q6H PRN Bobbitt, Shalon E, NP       alum & mag hydroxide-simeth (MAALOX/MYLANTA) 200-200-20 MG/5ML suspension 30 mL  30 mL Oral Q4H PRN Bobbitt, Shalon E, NP       haloperidol  (HALDOL ) tablet 5 mg  5 mg Oral TID PRN Bobbitt, Shalon E, NP       And   diphenhydrAMINE  (BENADRYL ) capsule 50 mg  50 mg Oral TID PRN Bobbitt, Shalon E, NP       haloperidol  lactate (HALDOL ) injection 5 mg  5 mg Intramuscular TID PRN Bobbitt, Shalon E, NP       And   diphenhydrAMINE  (BENADRYL ) injection 50 mg  50 mg Intramuscular TID PRN Bobbitt, Shalon E, NP       And   LORazepam  (ATIVAN ) injection 2 mg  2 mg Intramuscular TID PRN Bobbitt, Shalon E, NP       haloperidol  lactate (HALDOL ) injection 10 mg  10 mg Intramuscular TID PRN Bobbitt, Shalon E, NP       And   diphenhydrAMINE  (BENADRYL ) injection 50 mg  50 mg Intramuscular TID PRN Bobbitt, Shalon E, NP       And   LORazepam  (ATIVAN ) injection 2 mg  2 mg Intramuscular TID PRN Bobbitt, Shalon E, NP       haloperidol  (HALDOL ) tablet 10 mg  10 mg Oral BID Hunter, Crystal L, PA-C   10 mg at 08/29/24 2004   hydrOXYzine  (ATARAX ) tablet 25 mg  25 mg Oral TID PRN Bobbitt, Shalon E, NP       magnesium  hydroxide (MILK OF MAGNESIA) suspension 30 mL  30 mL Oral Daily PRN Bobbitt, Shalon E, NP       paliperidone  (INVEGA ) 24 hr tablet 9 mg  9 mg Oral Daily Montague, Crystal J, NP   9 mg at 08/29/24 0836   traZODone   (DESYREL ) tablet 50 mg  50 mg Oral QHS PRN Bobbitt, Shalon E, NP   50 mg at 08/28/24 2121    Lab Results:  No results found for this or any previous visit (from the past 48 hours).   Blood Alcohol level:  Lab Results  Component Value Date   [facility] <15 08/13/2024   ETH <15 01/14/2024    Metabolic Disorder Labs: Lab Results  Component Value Date   HGBA1C 5.1 08/21/2024   MPG 100 08/21/2024   MPG 96.8 01/18/2023   No results found for:  PROLACTIN Lab Results  Component Value Date   CHOL 155 08/21/2024   TRIG 73 08/21/2024   HDL 46 08/21/2024   CHOLHDL 3.4 08/21/2024   VLDL 15 08/21/2024   LDLCALC 95 08/21/2024   LDLCALC 81 01/18/2023    Physical Findings: AIMS:  , ,  ,  ,    CIWA:    COWS:      Psychiatric Specialty Exam:  Presentation  General Appearance:  Appropriate for Environment  Eye Contact: Fleeting  Speech: Clear and Coherent  Speech Volume: Normal    Mood and Affect  Mood: Euthymic  Affect: Constricted   Thought Process  Thought Processes: Other (comment) (Thought blocking)  Orientation:Partial (Oriented to person; unable to give date, location, or situation)  Thought Content:Scattered  Hallucinations: Denies Ideas of Reference:None  Suicidal Thoughts: Denies Homicidal Thoughts: Denies  Sensorium  Memory: Immediate Fair  Judgment: Impaired  Insight: Lacking   Art Therapist  Concentration: Fair  Attention Span: Fair  Recall: Fair  Fund of Knowledge: Poor  Language: Poor   Psychomotor Activity  Psychomotor Activity: Normal Musculoskeletal: Strength & Muscle Tone: within normal limits Gait & Station: normal Assets  Assets: Social Support    Physical Exam: Physical Exam Pulmonary:     Effort: Pulmonary effort is normal.  Neurological:     Mental Status: He is alert.    Review of Systems  Respiratory:  Negative for shortness of breath.   Cardiovascular:  Negative for chest pain.   Psychiatric/Behavioral:  Negative for suicidal ideas.    Blood pressure 113/84, pulse 77, temperature 97.9 F (36.6 C), temperature source Oral, resp. rate 18, height 5' 10 (1.778 m), weight 75.8 kg, SpO2 98%. Body mass index is 23.96 kg/m.  Diagnosis: Principal Problem:   Undifferentiated schizophrenia (HCC)   PLAN: Safety and Monitoring:  -- Involuntary admission to inpatient psychiatric unit for safety, stabilization and treatment  -- Daily contact with patient to assess and evaluate symptoms and progress in treatment  -- Patient's case to be discussed in multi-disciplinary team meeting  -- Observation Level : q15 minute checks  -- Vital signs:  q12 hours  -- Precautions: suicide, elopement, and assault -- Encouraged patient to participate in unit milieu and in scheduled group therapies  2. Psychiatric Treatment:  Scheduled Medications: - Continue Invega  9 mg once daily  - Increase Haldol  to 10 mg twice daily starting tomorrow  - Consider LAI after dose optimization to help promote medication adherence     -- The risks/benefits/side-effects/alternatives to this medication were discussed in detail with the patient and time was given for questions. The patient consents to medication trial.  3. Medical Issues Being Addressed:  No acute concerns.  4. Discharge Planning:   -- Social work and case management to assist with discharge planning and identification of hospital follow-up needs prior to discharge  -- Estimated LOS: Unable to estimate at this time  [employer], PA-C This note was created using Scientist, clinical (histocompatibility and immunogenetics). Please excuse any inadvertent transcription errors. Case was discussed with attending physician Dr. Trenton Verne who is agreeable with current plan.

## 2024-08-29 NOTE — [facility] Counselor (Addendum)
 CSW completed verbal for [facility] referral. Verbal interview was to have the patient placed on waitlist.  Sherryle Margo, MSW, LCSW 08/29/2024 2:52 PM

## 2024-08-29 NOTE — Plan of Care (Signed)
  Problem: Safety: Goal: Periods of time without injury will increase Outcome: Progressing   Problem: Education: Goal: Mental status will improve Outcome: Not Progressing   Problem: Activity: Goal: Interest or engagement in activities will improve Outcome: Not Progressing

## 2024-08-29 NOTE — Group Note (Signed)
 Recreation Therapy Group Note   Group Topic:Health and Wellness  Group Date: 08/29/2024 Start Time: 1525 End Time: 1555 Facilitators: Celestia Jeoffrey BRAVO, LRT, CTRS Location: Back Dayroom  Group Description: Light Exercise & Stretching. LRT discussed the mental and physical benefits of exercise. LRT and group discussed how physical activity can be used as a coping skill. LRT lead patients in a light exercise and stretching routine, with music being played in the background. Pt's encouraged to listen to their bodies and stop at any time if they experience feelings of discomfort or pain. Pts were encouraged to drink water and stay hydrated.   Goal Area(s) Addressed:  Patient will learn benefits of physical activity. Patient will identify exercise as a coping skill.  Patient will follow multistep directions. Patient will try a new leisure interest.   Affect/Mood: N/A   Participation Level: Did not attend    Clinical Observations/Individualized Feedback: Patient did not attend group.   Plan: Continue to engage patient in RT group sessions 2-3x/week.   Jeoffrey BRAVO Celestia, LRT, CTRS 08/29/2024 5:05 PM

## 2024-08-29 NOTE — Group Note (Signed)
 Date:  08/29/2024 Time:  10:25 AM  Group Topic/Focus:  Pet Therapy: Rollo the Dog has come to visit the unit!     Participation Level:  Active  Participation Quality:  Appropriate  Affect:  Appropriate  Cognitive:  Appropriate  Insight: Appropriate  Engagement in Group:  Engaged  Modes of Intervention:  Activity  Additional Comments:    Eric Pineda 08/29/2024, 10:25 AM

## 2024-08-29 NOTE — Progress Notes (Signed)
 D- Patient alert and oriented. Pt presents with a calm mood. Pt not speaking much. Pt denies SI, HI, AVH, and pain. Pt pacing most of the day and walking the halls.   A- Scheduled medications administered to patient, per MD orders. Support and encouragement provided.  Routine safety checks conducted every 15 minutes.  Patient informed to notify staff with problems or concerns.  R- No adverse drug reactions noted. Patient contracts for safety at this time. Patient compliant with medications and treatment plan. Patient receptive, calm, and cooperative. Patient did not interact with others on the unit outside of meal times and group.  Patient remains safe at this time.    Arayla Kruschke S.,RN

## 2024-08-29 NOTE — Group Note (Signed)
 Date:  08/29/2024 Time:  9:32 PM  Group Topic/Focus:  Emotional Education:   The focus of this group is to discuss what feelings/emotions are, and how they are experienced. Wrap-Up Group:   The focus of this group is to help patients review their daily goal of treatment and discuss progress on daily workbooks.    Participation Level:  Active  Participation Quality:  Appropriate and Redirectable  Affect:  Appropriate and Flat  Cognitive:  Alert and Appropriate  Insight: Improving  Engagement in Group:  Improving  Modes of Intervention:  Activity  Additional Comments:     Eric Pineda 08/29/2024, 9:32 PM

## 2024-08-29 NOTE — Plan of Care (Signed)
  Problem: Education: Goal: Emotional status will improve Outcome: Progressing Goal: Mental status will improve Outcome: Progressing   Problem: Activity: Goal: Interest or engagement in activities will improve Outcome: Not Progressing   Problem: Health Behavior/Discharge Planning: Goal: Compliance with treatment plan for underlying cause of condition will improve Outcome: Progressing   Problem: Physical Regulation: Goal: Ability to maintain clinical measurements within normal limits will improve Outcome: Progressing   Problem: Safety: Goal: Periods of time without injury will increase Outcome: Progressing

## 2024-08-30 NOTE — Group Note (Signed)

## 2024-08-30 NOTE — Plan of Care (Signed)
  Problem: Education: Goal: Emotional status will improve Outcome: Progressing Goal: Mental status will improve Outcome: Progressing   Problem: Activity: Goal: Interest or engagement in activities will improve Outcome: Not Progressing   Problem: Physical Regulation: Goal: Ability to maintain clinical measurements within normal limits will improve Outcome: Progressing   Problem: Safety: Goal: Periods of time without injury will increase Outcome: Progressing

## 2024-08-30 NOTE — Group Note (Signed)
 Date:  08/30/2024 Time:  5:35 PM  Group Topic/Focus:  Wellness Toolbox:   The focus of this group is to discuss various aspects of wellness, balancing those aspects and exploring ways to increase the ability to experience wellness.  Patients will create a wellness toolbox for use upon discharge.    Participation Level:  Active  Participation Quality:  Appropriate  Affect:  Appropriate  Cognitive:  Appropriate  Insight: Appropriate  Engagement in Group:  Engaged  Modes of Intervention:  Activity  Additional Comments:     Eric Pineda Eric Pineda 08/30/2024, 5:35 PM

## 2024-08-30 NOTE — Plan of Care (Signed)

## 2024-08-30 NOTE — Progress Notes (Signed)
 This writer went to get patient for scheduled morning medication. Patient stated that he would come up, but has yet to do so.

## 2024-08-30 NOTE — Group Note (Signed)
 Date:  08/30/2024 Time:  11:04 AM  Group Topic/Focus:  Building Self Esteem:   The Focus of this group is helping patients become aware of the effects of self-esteem on their lives, the things they and others do that enhance or undermine their self-esteem, seeing the relationship between their level of self-esteem and the choices they make and learning ways to enhance self-esteem.    Participation Level:  Did Not Attend   [name] [name] Rilei Kravitz 08/30/2024, 11:04 AM

## 2024-08-30 NOTE — Progress Notes (Signed)
 Patient ID: Eric Pineda, male   DOB: Sep 30, 1995, 28 y.o.   MRN: [id number] [facility] MD Progress Note  08/30/2024 4:57 PM Eric Pineda  MRN:  [id number]   Subjective:  Chart reviewed, case discussed in multidisciplinary meeting, patient seen during rounds.   12/10: On interview today, patient is noted to be pacing in hallway.  Patient stops briefly to engage in interview with provider, though remains minimal.  He reports tolerating current medications well without adverse effects.  He denies SI/HI/plan and denies hallucinations.  He reports stable sleep and appetite.  He does not voice any concerns or complaints at this time.  Patient is not displaying any unsafe behaviors.  He continues to display thought blocking and appears internally preoccupied.  Per social work team, APS has declined to take patient's case.   12/9: Patient is noted to be walking in the hallway and continues to pace.  Patient does not stop to talk to the provider and does not respond to questions related to mental health.  Patient is not endorsing SI/HI and is not displaying any unsafe behaviors.  Patient is noted to be responding to stimuli intermittently. 12/8: On interview today, patient is found pacing in the hallway.  He continues to be minimally engaged in interview.  No unsafe behaviors have been reported.  He continues to display thought blocking and appears internally preoccupied.  Patient reports tolerating medication regimen well without adverse effects.  Will plan to increase Haldol  to 10 mg twice daily starting tomorrow.  He denies SI/HI/plan and denies hallucinations.  He reports good sleep and appetite.  Patient then walks away during interview stating okay thank you.  12/7: On interview today, patient is found pacing in the hallway.  He is minimally engaged in interview. He reports tolerating medication regimen well without adverse effects.  He reports stable sleep and appetite. He denies SI/HI/plan and denies  hallucinations.  He does not voice any concerns or complaints at this time.  He continues to display thought blocking and appears internally preoccupied. Provider discussed plan of care with patient's sister, Eric Pineda, and mother, Eric Pineda. Social work team has made APS report.  12/6: On interview today, patient is found pacing in hallway.  He continues to be minimally engaged in interview and is calm and cooperative.  He is alert and oriented to person.  He is unable to discuss reason for being in hospital.  He continues to display thought blocking and appears to be internally preoccupied.  He denies SI/HI/plan and denies hallucinations.  He denies symptoms of depression or anxiety.  He reports good sleep and appetite.  He does not voice any concerns or complaints at this time.  He is tolerating medication regimen well without adverse effects.  Current medication regimen includes Invega  9 mg once daily and Haldol  5 mg once daily.    Past Psychiatric History: see h&P Family History: History reviewed. No pertinent family history. Social History:  Social History   Substance and Sexual Activity  Alcohol Use Never     Social History   Substance and Sexual Activity  Drug Use Yes    Social History   Socioeconomic History   Marital status: Single    Spouse name: Not on file   Number of children: Not on file   Years of education: Not on file   Highest education level: Not on file  Occupational History   Not on file  Tobacco Use   Smoking status: Never   Smokeless tobacco: Never  Tobacco comments:    Patient denied current use of tobacco  Vaping Use   Vaping status: Never Used  Substance and Sexual Activity   Alcohol use: Never   Drug use: Yes   Sexual activity: Not on file  Other Topics Concern   Not on file  Social History Narrative   Not on file   Social Drivers of Health   Financial Resource Strain: Not on file  Food Insecurity: Food Insecurity Present  (08/14/2024)   Hunger Vital Sign    Worried About Running Out of Food in the Last Year: Sometimes true    Ran Out of Food in the Last Year: Never true  Transportation Needs: No Transportation Needs (08/14/2024)   PRAPARE - Administrator, Civil Service (Medical): No    Lack of Transportation (Non-Medical): No  Physical Activity: Not on file  Stress: Not on file  Social Connections: Unknown (01/17/2024)   Social Connection and Isolation Panel    Frequency of Communication with Friends and Family: Patient declined    Frequency of Social Gatherings with Friends and Family: Patient declined    Attends Religious Services: Patient declined    Database Administrator or Organizations: Patient declined    Attends Banker Meetings: Patient declined    Marital Status: Never married   Past Medical History:  Past Medical History:  Diagnosis Date   Bizarre behavior 12/02/2019   History reviewed. No pertinent surgical history.  Current Medications: Current Facility-Administered Medications  Medication Dose Route Frequency Provider Last Rate Last Admin   acetaminophen  (TYLENOL ) tablet 650 mg  650 mg Oral Q6H PRN Bobbitt, Shalon E, NP       alum & mag hydroxide-simeth (MAALOX/MYLANTA) 200-200-20 MG/5ML suspension 30 mL  30 mL Oral Q4H PRN Bobbitt, Shalon E, NP       haloperidol  (HALDOL ) tablet 5 mg  5 mg Oral TID PRN Bobbitt, Shalon E, NP       And   diphenhydrAMINE  (BENADRYL ) capsule 50 mg  50 mg Oral TID PRN Bobbitt, Shalon E, NP       haloperidol  lactate (HALDOL ) injection 5 mg  5 mg Intramuscular TID PRN Bobbitt, Shalon E, NP       And   diphenhydrAMINE  (BENADRYL ) injection 50 mg  50 mg Intramuscular TID PRN Bobbitt, Shalon E, NP       And   LORazepam  (ATIVAN ) injection 2 mg  2 mg Intramuscular TID PRN Bobbitt, Shalon E, NP       haloperidol  lactate (HALDOL ) injection 10 mg  10 mg Intramuscular TID PRN Bobbitt, Shalon E, NP       And   diphenhydrAMINE  (BENADRYL )  injection 50 mg  50 mg Intramuscular TID PRN Bobbitt, Shalon E, NP       And   LORazepam  (ATIVAN ) injection 2 mg  2 mg Intramuscular TID PRN Bobbitt, Shalon E, NP       haloperidol  (HALDOL ) tablet 10 mg  10 mg Oral BID Barbee Mamula L, PA-C   10 mg at 08/30/24 9040   hydrOXYzine  (ATARAX ) tablet 25 mg  25 mg Oral TID PRN Bobbitt, Shalon E, NP       magnesium  hydroxide (MILK OF MAGNESIA) suspension 30 mL  30 mL Oral Daily PRN Bobbitt, Shalon E, NP       paliperidone  (INVEGA ) 24 hr tablet 9 mg  9 mg Oral Daily Montague, Ashlin Kreps J, NP   9 mg at 08/30/24 0959   traZODone  (DESYREL ) tablet 50  mg  50 mg Oral QHS PRN Bobbitt, Shalon E, NP   50 mg at 08/28/24 2121    Lab Results:  No results found for this or any previous visit (from the past 48 hours).   Blood Alcohol level:  Lab Results  Component Value Date   [facility] <15 08/13/2024   ETH <15 01/14/2024    Metabolic Disorder Labs: Lab Results  Component Value Date   HGBA1C 5.1 08/21/2024   MPG 100 08/21/2024   MPG 96.8 01/18/2023   No results found for: PROLACTIN Lab Results  Component Value Date   CHOL 155 08/21/2024   TRIG 73 08/21/2024   HDL 46 08/21/2024   CHOLHDL 3.4 08/21/2024   VLDL 15 08/21/2024   LDLCALC 95 08/21/2024   LDLCALC 81 01/18/2023    Physical Findings: AIMS:  , ,  ,  ,    CIWA:    COWS:      Psychiatric Specialty Exam:  Presentation  General Appearance:  Appropriate for Environment  Eye Contact: Fleeting  Speech: Clear and Coherent  Speech Volume: Normal    Mood and Affect  Mood: Euthymic  Affect: Constricted   Thought Process  Thought Processes: Other (comment) (Thought blocking)  Orientation:Partial (Oriented to person; unable to give date, location, or situation)  Thought Content:Scattered  Hallucinations: Denies Ideas of Reference:None  Suicidal Thoughts: Denies Homicidal Thoughts: Denies  Sensorium  Memory: Immediate  Fair  Judgment: Impaired  Insight: Lacking   Art Therapist  Concentration: Fair  Attention Span: Fair  Recall: [employer] of Knowledge: Poor  Language: Poor   Psychomotor Activity  Psychomotor Activity: Normal Musculoskeletal: Strength & Muscle Tone: within normal limits Gait & Station: normal Assets  Assets: Social Support    Physical Exam: Physical Exam Pulmonary:     Effort: Pulmonary effort is normal.  Neurological:     Mental Status: He is alert.    Review of Systems  Respiratory:  Negative for shortness of breath.   Cardiovascular:  Negative for chest pain.  Psychiatric/Behavioral:  Negative for suicidal ideas.    Blood pressure 124/83, pulse 78, temperature 98 F (36.7 C), temperature source Oral, resp. rate 16, height 5' 10 (1.778 m), weight 75.8 kg, SpO2 98%. Body mass index is 23.96 kg/m.  Diagnosis: Principal Problem:   Undifferentiated schizophrenia (HCC)   PLAN: Safety and Monitoring:  -- Involuntary admission to inpatient psychiatric unit for safety, stabilization and treatment  -- Daily contact with patient to assess and evaluate symptoms and progress in treatment  -- Patient's case to be discussed in multi-disciplinary team meeting  -- Observation Level : q15 minute checks  -- Vital signs:  q12 hours  -- Precautions: suicide, elopement, and assault -- Encouraged patient to participate in unit milieu and in scheduled group therapies  2. Psychiatric Treatment:  Scheduled Medications: - Continue Invega  9 mg once daily  - Continue Haldol  10 mg twice daily  - Consider LAI after dose optimization to help promote medication adherence     -- The risks/benefits/side-effects/alternatives to this medication were discussed in detail with the patient and time was given for questions. The patient consents to medication trial.  3. Medical Issues Being Addressed:  No acute concerns.  4. Discharge Planning:   -- Social work and case  management to assist with discharge planning and identification of hospital follow-up needs prior to discharge  -- Estimated LOS: Unable to estimate at this time  [employer], PA-C This note was created using Scientist, clinical (histocompatibility and immunogenetics).  Please excuse any inadvertent transcription errors. Case was discussed with attending physician Dr. Jadapalle who is agreeable with current plan.

## 2024-08-30 NOTE — BHH Counselor (Signed)
 CSW received call back from [facility] APS.  CSW was informed that the APS referral was screened out.  CSW asked for clarification and was informed it may have been different if patient was not in the hospital but he is.  CSW to make the team aware.  Sherryle Margo, MSW, LCSW 08/30/2024 7:35 AM

## 2024-08-30 NOTE — Group Note (Signed)
 Date:  08/30/2024 Time:  8:41 PM  Group Topic/Focus:  Wrap-Up Group:   The focus of this group is to help patients review their daily goal of treatment and discuss progress on daily workbooks.    Participation Level:  Did Not Attend Eric Pineda 08/30/2024, 8:41 PM

## 2024-08-30 NOTE — Progress Notes (Signed)
 Patient came up and took scheduled medication, without any issues.

## 2024-08-30 NOTE — Progress Notes (Signed)
°   08/30/24 1600  Psych Admission Type (Psych Patients Only)  Admission Status Involuntary  Psychosocial Assessment  Patient Complaints None  Eye Contact Fair  Facial Expression Flat  Affect Preoccupied (pacing and watching the clock)  Speech Soft  Interaction Minimal;No initiation  Motor Activity Pacing;Slow;Restless (frequently paces the unit throughout the day)  Appearance/Hygiene In scrubs  Behavior Characteristics Cooperative;Pacing;Restless  Mood Pleasant  Aggressive Behavior  Effect No apparent injury  Thought Process  Coherency WDL  Content WDL  Delusions None reported or observed  Perception WDL  Hallucination None reported or observed  Judgment Limited  Confusion None  Danger to Self  Current suicidal ideation? Denies  Self-Injurious Behavior No self-injurious ideation or behavior indicators observed or expressed   Agreement Not to Harm Self Yes  Description of Agreement Verbal  Danger to Others  Danger to Others None reported or observed  Danger to Others Abnormal  Harmful Behavior to others No threats or harm toward other people  Destructive Behavior No threats or harm toward property   Patient's goal for today, per his self-inventory is studying, in which he will observe in order to achieve his goal.

## 2024-08-30 NOTE — Progress Notes (Signed)
°   08/30/24 2200  Psych Admission Type (Psych Patients Only)  Admission Status Involuntary  Psychosocial Assessment  Patient Complaints None  Eye Contact Brief  Facial Expression Flat  Affect Preoccupied  Speech Soft  Interaction Minimal  Motor Activity Pacing  Appearance/Hygiene In scrubs  Behavior Characteristics Cooperative;Pacing  Mood Pleasant;Preoccupied  Thought Process  Coherency Blocking  Content Preoccupation  Delusions None reported or observed  Perception UTA  Hallucination UTA (Patient denies hallucinations but continues to appear internally preoccupied)  Judgment Impaired  Confusion Mild  Danger to Self  Current suicidal ideation? Denies  Self-Injurious Behavior No self-injurious ideation or behavior indicators observed or expressed   Danger to Others  Danger to Others None reported or observed  Danger to Others Abnormal  Harmful Behavior to others No threats or harm toward other people  Destructive Behavior No threats or harm toward property

## 2024-08-31 NOTE — BHH Counselor (Signed)
 CSW spoke with the patient's mother, Myrick, [phone].  Call was assisted with the use of [address], [place], #[id number].    CSW explained that the patient was approaching discharge.  Mother reports that she will pick the patient up after she gets off if discharged on Friday or Saturday.  CSW inquired about safety concerns and weapons in the home.  Mother denied both weapons and safety concerns.   CSW explained that recommendations at this time are for the pt to have a guardianship.  CSW explained how and what guardianship is.   CSW reviewed medication and treatment recommendations and how guardianship can support that.    Sherryle Margo, MSW, LCSW 08/31/2024 4:12 PM

## 2024-08-31 NOTE — BHH Counselor (Signed)
 CSW contacted [facility] and confirmed patient remains on waitlist.   Sherryle Margo, MSW, LCSW 08/31/2024 2:29 PM

## 2024-08-31 NOTE — Group Note (Signed)
 Recreation Therapy Group Note   Group Topic:General Recreation  Group Date: 08/31/2024 Start Time: 1500 End Time: 1545 Facilitators: Celestia Jeoffrey BRAVO, LRT, CTRS Location: Courtyard  Group Description: [employer]. LRT and patients played games of basketball, drew with chalk, and played corn hole while outside in the courtyard while getting fresh air and sunlight. Music was being played in the background. LRT and peers conversed about different games they have played before, what they do in their free time and anything else that is on their minds. LRT encouraged pts to drink water after being outside, sweating and getting their heart rate up.  Goal Area(s) Addressed: Patient will build on frustration tolerance skills. Patients will partake in a competitive play game with peers. Patients will gain knowledge of new leisure interest/hobby.    Affect/Mood: N/A   Participation Level: Did not attend    Clinical Observations/Individualized Feedback: Patient did not attend group.   Plan: Continue to engage patient in RT group sessions 2-3x/week.   Jeoffrey BRAVO Celestia, LRT, CTRS 08/31/2024 5:16 PM

## 2024-08-31 NOTE — Progress Notes (Signed)
 Patient ID: Eric Pineda, male   DOB: [birth year], 28 y.o.   MRN: [id number] [facility] MD Progress Note  08/31/2024 9:48 PM Ami Thornsberry  MRN:  [id number]   Subjective:  Chart reviewed, case discussed in multidisciplinary meeting, patient seen during rounds.   12/11: On interview today, patient is noted to be resting in his room. He is alert, calm, and cooperative. He continues to be minimally engaged in interview. He appears internally preoccupied. He reports tolerating current medication regimen well without adverse effects. He denies SI/HI/plan and denies hallucinations. He has maintained safe behaviors on the unit.  Provider and CSW Sherryle Chary contacted patient's mother to give update and discharge planning. Call was assisted with the use of [address] [place], #[id number]. Patient's mother confirmed patient will return to live with her upon hospital discharge. Mother does not voice any safety concerns and states patient does not have access to firearms or other weapons. She is agreeable to patient's upcoming discharge in the next few days. CSW explained that our recommendation is for patient to have a guardianship. An APS report was made by CSW but the case was not accepted. CSW explained guardianship and process, and reason for this recommendation. CSW discussed treatment recommendations including medication and outpatient follow-up with mother and how guardianship can support this. All questions answered.      12/10: On interview today, patient is noted to be pacing in hallway.  Patient stops briefly to engage in interview with provider, though remains minimal.  He reports tolerating current medications well without adverse effects.  He denies SI/HI/plan and denies hallucinations.  He reports stable sleep and appetite.  He does not voice any concerns or complaints at this time.  Patient is not displaying any unsafe behaviors.  He continues to display thought blocking and appears internally  preoccupied.  Per social work team, APS has declined to take patient's case.   12/9: Patient is noted to be walking in the hallway and continues to pace.  Patient does not stop to talk to the provider and does not respond to questions related to mental health.  Patient is not endorsing SI/HI and is not displaying any unsafe behaviors.  Patient is noted to be responding to stimuli intermittently. 12/8: On interview today, patient is found pacing in the hallway.  He continues to be minimally engaged in interview.  No unsafe behaviors have been reported.  He continues to display thought blocking and appears internally preoccupied.  Patient reports tolerating medication regimen well without adverse effects.  Will plan to increase Haldol  to 10 mg twice daily starting tomorrow.  He denies SI/HI/plan and denies hallucinations.  He reports good sleep and appetite.  Patient then walks away during interview stating okay thank you.  12/7: On interview today, patient is found pacing in the hallway.  He is minimally engaged in interview. He reports tolerating medication regimen well without adverse effects.  He reports stable sleep and appetite. He denies SI/HI/plan and denies hallucinations.  He does not voice any concerns or complaints at this time.  He continues to display thought blocking and appears internally preoccupied. Provider discussed plan of care with patient's sister, Marrion Accomando, and mother, [name]. Social work team has made APS report.  12/6: On interview today, patient is found pacing in hallway.  He continues to be minimally engaged in interview and is calm and cooperative.  He is alert and oriented to person.  He is unable to discuss reason for being in hospital.  He  continues to display thought blocking and appears to be internally preoccupied.  He denies SI/HI/plan and denies hallucinations.  He denies symptoms of depression or anxiety.  He reports good sleep and appetite.  He does not  voice any concerns or complaints at this time.  He is tolerating medication regimen well without adverse effects.  Current medication regimen includes Invega  9 mg once daily and Haldol  5 mg once daily.    Past Psychiatric History: see h&P Family History: History reviewed. No pertinent family history. Social History:  Social History   Substance and Sexual Activity  Alcohol Use Never     Social History   Substance and Sexual Activity  Drug Use Yes    Social History   Socioeconomic History   Marital status: Single    Spouse name: Not on file   Number of children: Not on file   Years of education: Not on file   Highest education level: Not on file  Occupational History   Not on file  Tobacco Use   Smoking status: Never   Smokeless tobacco: Never   Tobacco comments:    Patient denied current use of tobacco  Vaping Use   Vaping status: Never Used  Substance and Sexual Activity   Alcohol use: Never   Drug use: Yes   Sexual activity: Not on file  Other Topics Concern   Not on file  Social History Narrative   Not on file   Social Drivers of Health   Tobacco Use: Low Risk (08/14/2024)   Patient History    Smoking Tobacco Use: Never    Smokeless Tobacco Use: Never    Passive Exposure: Not on file  Financial Resource Strain: Not on file  Food Insecurity: Food Insecurity Present (08/14/2024)   Epic    Worried About Programme Researcher, Broadcasting/film/video in the Last Year: Sometimes true    [employer] of Food in the Last Year: Never true  Transportation Needs: No Transportation Needs (08/14/2024)   Epic    Lack of Transportation (Medical): No    Lack of Transportation (Non-Medical): No  Physical Activity: Not on file  Stress: Not on file  Social Connections: Unknown (01/17/2024)   Social Connection and Isolation Panel    Frequency of Communication with Friends and Family: Patient declined    Frequency of Social Gatherings with Friends and Family: Patient declined    Attends Religious  Services: Patient declined    Active Member of Clubs or Organizations: Patient declined    Attends Banker Meetings: Patient declined    Marital Status: Never married  Depression (PHQ2-9): Not on file  Alcohol Screen: Low Risk (08/14/2024)   Alcohol Screen    Last Alcohol Screening Score (AUDIT): 0  Housing: Low Risk (08/14/2024)   Epic    Unable to Pay for Housing in the Last Year: No    Number of Times Moved in the Last Year: 0    Homeless in the Last Year: No  Utilities: Not At Risk (08/14/2024)   Epic    Threatened with loss of utilities: No  Health Literacy: Not on file   Past Medical History:  Past Medical History:  Diagnosis Date   Bizarre behavior 12/02/2019   History reviewed. No pertinent surgical history.  Current Medications: Current Facility-Administered Medications  Medication Dose Route Frequency Provider Last Rate Last Admin   acetaminophen  (TYLENOL ) tablet 650 mg  650 mg Oral Q6H PRN Bobbitt, Shalon E, NP       alum &  mag hydroxide-simeth (MAALOX/MYLANTA) 200-200-20 MG/5ML suspension 30 mL  30 mL Oral Q4H PRN Bobbitt, Shalon E, NP       haloperidol  (HALDOL ) tablet 5 mg  5 mg Oral TID PRN Bobbitt, Shalon E, NP       And   diphenhydrAMINE  (BENADRYL ) capsule 50 mg  50 mg Oral TID PRN Bobbitt, Shalon E, NP       haloperidol  lactate (HALDOL ) injection 5 mg  5 mg Intramuscular TID PRN Bobbitt, Shalon E, NP       And   diphenhydrAMINE  (BENADRYL ) injection 50 mg  50 mg Intramuscular TID PRN Bobbitt, Shalon E, NP       And   LORazepam  (ATIVAN ) injection 2 mg  2 mg Intramuscular TID PRN Bobbitt, Shalon E, NP       haloperidol  lactate (HALDOL ) injection 10 mg  10 mg Intramuscular TID PRN Bobbitt, Shalon E, NP       And   diphenhydrAMINE  (BENADRYL ) injection 50 mg  50 mg Intramuscular TID PRN Bobbitt, Shalon E, NP       And   LORazepam  (ATIVAN ) injection 2 mg  2 mg Intramuscular TID PRN Bobbitt, Shalon E, NP       haloperidol  (HALDOL ) tablet 10 mg  10 mg  Oral BID Saroya Riccobono L, PA-C   10 mg at 08/31/24 2131   hydrOXYzine  (ATARAX ) tablet 25 mg  25 mg Oral TID PRN Bobbitt, Shalon E, NP       magnesium  hydroxide (MILK OF MAGNESIA) suspension 30 mL  30 mL Oral Daily PRN Bobbitt, Shalon E, NP       paliperidone  (INVEGA ) 24 hr tablet 9 mg  9 mg Oral Daily Montague, Taralynn Quiett J, NP   9 mg at 08/31/24 1020   traZODone  (DESYREL ) tablet 50 mg  50 mg Oral QHS PRN Bobbitt, Shalon E, NP   50 mg at 08/28/24 2121    Lab Results:  No results found for this or any previous visit (from the past 48 hours).   Blood Alcohol level:  Lab Results  Component Value Date   [facility] <15 08/13/2024   ETH <15 01/14/2024    Metabolic Disorder Labs: Lab Results  Component Value Date   HGBA1C 5.1 08/21/2024   MPG 100 08/21/2024   MPG 96.8 01/18/2023   No results found for: PROLACTIN Lab Results  Component Value Date   CHOL 155 08/21/2024   TRIG 73 08/21/2024   HDL 46 08/21/2024   CHOLHDL 3.4 08/21/2024   VLDL 15 08/21/2024   LDLCALC 95 08/21/2024   LDLCALC 81 01/18/2023    Physical Findings: AIMS:  , ,  ,  ,    CIWA:    COWS:      Psychiatric Specialty Exam:  Presentation  General Appearance:  Appropriate for Environment  Eye Contact: Fleeting  Speech: Clear and Coherent  Speech Volume: Normal    Mood and Affect  Mood: Euthymic  Affect: Constricted   Thought Process  Thought Processes: Other (comment) (Thought blocking)  Orientation:Partial (Oriented to person; unable to give date, location, or situation)  Thought Content:Scattered  Hallucinations: Denies Ideas of Reference:None  Suicidal Thoughts: Denies Homicidal Thoughts: Denies  Sensorium  Memory: Immediate Fair  Judgment: Impaired  Insight: Lacking   Art Therapist  Concentration: Fair  Attention Span: Fair  Recall: Fair  Fund of Knowledge: Poor  Language: Poor   Psychomotor Activity  Psychomotor Activity:  Normal Musculoskeletal: Strength & Muscle Tone: within normal limits Gait & Station: normal Assets  Assets: Social Support    Physical Exam: Physical Exam Pulmonary:     Effort: Pulmonary effort is normal.  Neurological:     Mental Status: He is alert.    Review of Systems  Respiratory:  Negative for shortness of breath.   Cardiovascular:  Negative for chest pain.  Psychiatric/Behavioral:  Negative for suicidal ideas.    Blood pressure 125/86, pulse 81, temperature 97.9 F (36.6 C), temperature source Oral, resp. rate 16, height 5' 10 (1.778 m), weight 75.8 kg, SpO2 99%. Body mass index is 23.96 kg/m.  Diagnosis: Principal Problem:   Undifferentiated schizophrenia (HCC)   PLAN: Safety and Monitoring:  -- Involuntary admission to inpatient psychiatric unit for safety, stabilization and treatment  -- Daily contact with patient to assess and evaluate symptoms and progress in treatment  -- Patient's case to be discussed in multi-disciplinary team meeting  -- Observation Level : q15 minute checks  -- Vital signs:  q12 hours  -- Precautions: suicide, elopement, and assault -- Encouraged patient to participate in unit milieu and in scheduled group therapies  2. Psychiatric Treatment:  Scheduled Medications: - Continue Invega  9 mg once daily  - Continue Haldol  10 mg twice daily  - Consider LAI after dose optimization to help promote medication adherence     -- The risks/benefits/side-effects/alternatives to this medication were discussed in detail with the patient and time was given for questions. The patient consents to medication trial.  3. Medical Issues Being Addressed:  No acute concerns.  4. Discharge Planning:   -- Social work and case management to assist with discharge planning and identification of hospital follow-up needs prior to discharge  -- Estimated LOS: Unable to estimate at this time  [employer], PA-C This note was created using Her[employer]. Please excuse any inadvertent transcription errors. Case was discussed with attending physician Dr. Jadapalle who is agreeable with current plan.

## 2024-08-31 NOTE — Group Note (Signed)
 Date:  08/31/2024 Time:  11:09 AM  Group Topic/Focus:  Goals Group:   The focus of this group is to help patients establish daily goals to achieve during treatment and discuss how the patient can incorporate goal setting into their daily lives to aide in recovery.    Participation Level:  Did Not Attend   [name] [name] Ryiah Bellissimo 08/31/2024, 11:09 AM

## 2024-08-31 NOTE — Group Note (Signed)
 Date:  08/31/2024 Time:  9:24 PM  Group Topic/Focus:  Self Care:   The focus of this group is to help patients understand the importance of self-care in order to improve or restore emotional, physical, spiritual, interpersonal, and financial health.    Pt did not attend group.  Britanny Marksberry L 08/31/2024, 9:24 PM

## 2024-08-31 NOTE — BH IP Treatment Plan (Signed)
 Interdisciplinary Treatment and Diagnostic Plan Update  08/31/2024 Time of Session: 11:30AM Eric Pineda MRN: [id number]  Principal Diagnosis: Undifferentiated schizophrenia [facility])  Secondary Diagnoses: Principal Problem:   Undifferentiated schizophrenia (HCC)   Current Medications:  Current Facility-Administered Medications  Medication Dose Route Frequency Provider Last Rate Last Admin   acetaminophen  (TYLENOL ) tablet 650 mg  650 mg Oral Q6H PRN Bobbitt, Shalon E, NP       alum & mag hydroxide-simeth (MAALOX/MYLANTA) 200-200-20 MG/5ML suspension 30 mL  30 mL Oral Q4H PRN Bobbitt, Shalon E, NP       haloperidol  (HALDOL ) tablet 5 mg  5 mg Oral TID PRN Bobbitt, Shalon E, NP       And   diphenhydrAMINE  (BENADRYL ) capsule 50 mg  50 mg Oral TID PRN Bobbitt, Shalon E, NP       haloperidol  lactate (HALDOL ) injection 5 mg  5 mg Intramuscular TID PRN Bobbitt, Shalon E, NP       And   diphenhydrAMINE  (BENADRYL ) injection 50 mg  50 mg Intramuscular TID PRN Bobbitt, Shalon E, NP       And   LORazepam  (ATIVAN ) injection 2 mg  2 mg Intramuscular TID PRN Bobbitt, Shalon E, NP       haloperidol  lactate (HALDOL ) injection 10 mg  10 mg Intramuscular TID PRN Bobbitt, Shalon E, NP       And   diphenhydrAMINE  (BENADRYL ) injection 50 mg  50 mg Intramuscular TID PRN Bobbitt, Shalon E, NP       And   LORazepam  (ATIVAN ) injection 2 mg  2 mg Intramuscular TID PRN Bobbitt, Shalon E, NP       haloperidol  (HALDOL ) tablet 10 mg  10 mg Oral BID Hunter, Crystal L, PA-C   10 mg at 08/31/24 1020   hydrOXYzine  (ATARAX ) tablet 25 mg  25 mg Oral TID PRN Bobbitt, Shalon E, NP       magnesium  hydroxide (MILK OF MAGNESIA) suspension 30 mL  30 mL Oral Daily PRN Bobbitt, Shalon E, NP       paliperidone  (INVEGA ) 24 hr tablet 9 mg  9 mg Oral Daily Montague, Crystal J, NP   9 mg at 08/31/24 1020   traZODone  (DESYREL ) tablet 50 mg  50 mg Oral QHS PRN Bobbitt, Shalon E, NP   50 mg at 08/28/24 2121   PTA  Medications: Medications Prior to Admission  Medication Sig Dispense Refill Last Dose/Taking   benztropine  (COGENTIN ) 0.5 MG tablet Take 1 tablet (0.5 mg total) by mouth daily. (Patient not taking: Reported on 08/14/2024) 30 tablet 0    paliperidone  (INVEGA  SUSTENNA) 234 MG/1.5ML injection Inject 234 mg into the muscle every 28 (twenty-eight) days. Next dose due q28 days, 02/24/2024 (Patient not taking: Reported on 08/14/2024) 1.5 mL 0     Patient Stressors: Health problems   Medication change or noncompliance    Patient Strengths: Motivation for treatment/growth  Physical Health  Supportive family/friends   Treatment Modalities: Medication Management, Group therapy, Case management,  1 to 1 session with clinician, Psychoeducation, Recreational therapy.   Physician Treatment Plan for Primary Diagnosis: Undifferentiated schizophrenia (HCC) Long Term Goal(s): Improvement in symptoms so as ready for discharge   Short Term Goals: Ability to identify changes in lifestyle to reduce recurrence of condition will improve Ability to verbalize feelings will improve Ability to maintain clinical measurements within normal limits will improve Compliance with prescribed medications will improve  Medication Management: Evaluate patient's response, side effects, and tolerance of medication regimen.  Therapeutic Interventions:  1 to 1 sessions, Unit Group sessions and Medication administration.  Evaluation of Outcomes: Not Progressing  Physician Treatment Plan for Secondary Diagnosis: Principal Problem:   Undifferentiated schizophrenia (HCC)  Long Term Goal(s): Improvement in symptoms so as ready for discharge   Short Term Goals: Ability to identify changes in lifestyle to reduce recurrence of condition will improve Ability to verbalize feelings will improve Ability to maintain clinical measurements within normal limits will improve Compliance with prescribed medications will improve      Medication Management: Evaluate patient's response, side effects, and tolerance of medication regimen.  Therapeutic Interventions: 1 to 1 sessions, Unit Group sessions and Medication administration.  Evaluation of Outcomes: Not Progressing   RN Treatment Plan for Primary Diagnosis: Undifferentiated schizophrenia (HCC) Long Term Goal(s): Knowledge of disease and therapeutic regimen to maintain health will improve  Short Term Goals: Ability to verbalize frustration and anger appropriately will improve, Ability to demonstrate self-control, Ability to participate in decision making will improve, Ability to verbalize feelings will improve, Ability to disclose and discuss suicidal ideas, Ability to identify and develop effective coping behaviors will improve, and Compliance with prescribed medications will improve  Medication Management: RN will administer medications as ordered by provider, will assess and evaluate patient's response and provide education to patient for prescribed medication. RN will report any adverse and/or side effects to prescribing provider.  Therapeutic Interventions: 1 on 1 counseling sessions, Psychoeducation, Medication administration, Evaluate responses to treatment, Monitor vital signs and CBGs as ordered, Perform/monitor CIWA, COWS, AIMS and Fall Risk screenings as ordered, Perform wound care treatments as ordered.  Evaluation of Outcomes: Not Progressing   LCSW Treatment Plan for Primary Diagnosis: Undifferentiated schizophrenia (HCC) Long Term Goal(s): Safe transition to appropriate next level of care at discharge, Engage patient in therapeutic group addressing interpersonal concerns.  Short Term Goals: Engage patient in aftercare planning with referrals and resources, Increase social support, Increase ability to appropriately verbalize feelings, Increase emotional regulation, Facilitate acceptance of mental health diagnosis and concerns, and Increase skills for  wellness and recovery  Therapeutic Interventions: Assess for all discharge needs, 1 to 1 time with Social worker, Explore available resources and support systems, Assess for adequacy in community support network, Educate family and significant other(s) on suicide prevention, Complete Psychosocial Assessment, Interpersonal group therapy.  Evaluation of Outcomes: Not Progressing   Progress in Treatment: Attending groups: No. Participating in groups: No. Taking medication as prescribed: Yes. Toleration medication: Yes. Family/Significant other contact made: Yes, individual(s) contacted:  SPE completed with the patient's mother.  Patient understands diagnosis: No. Discussing patient identified problems/goals with staff: Yes. Medical problems stabilized or resolved: Yes. Denies suicidal/homicidal ideation: Yes. Issues/concerns per patient self-inventory: No. Other: none  New problem(s) identified: No, Describe:  None  Update 08/21/2024: No changes at this time.  Update 08/26/2024: No changes at this time. Update 08/31/2024:  No changes at this time.    New Short Term/Long Term Goal(s):detox, elimination of symptoms of psychosis, medication management for mood stabilization; elimination of SI thoughts; development of comprehensive mental wellness/sobriety plan.  Update 08/21/2024: No changes at this time.  Update 08/26/2024: No changes at this time.  Update 08/31/2024:  No changes at this time.    Patient Goals:  Patient declined.  Update 08/21/2024: No changes at this time.  Update 08/26/2024: No changes at this time.  Update 08/31/2024:  No changes at this time.    Discharge Plan or Barriers: CSW to assist with the development of appropriate discharge plan.  Update 08/21/2024:  Patient continues to decline referrals for aftecare following his admission.  Patient's mother reports that the patient can return to the home and she will provide transportation.  Update 08/26/2024: No changes at this time.   Update 08/31/2024:  APS report was made regarding patient's ability to care for self.  Case was NOT screened in.  Referral to [facility] has occurred and patient remains on the waitlist.  Concern that patient is cheeking his medication, per nursing report.  No additional concerns at this time.     Reason for Continuation of Hospitalization: Anxiety Delusions  Depression Hallucinations Suicidal ideation   Estimated Length of Stay: 1-7 days.  Update 08/21/2024: TBD  Update 08/26/2024: TBD  Update 08/31/2024:  TBD  Last 3 [place] Suicide Severity Risk Score: Flowsheet Row Admission (Current) from 08/14/2024 in [facility]PATIENT [facility]INE ED from 08/13/2024 in [facility] Emergency Department at [facility] Admission (Discharged) from 01/17/2024 in [facility] INPATIENT BEHAVIORAL MEDICINE  C-SSRS RISK CATEGORY No Risk No Risk No Risk    Last PHQ 2/9 Scores:     No data to display          Scribe for Treatment Team: Sherryle JINNY Paola KEN 08/31/2024 2:31 PM

## 2024-08-31 NOTE — Plan of Care (Signed)
°  Problem: Education: Goal: Emotional status will improve Outcome: Not Met (add Reason) Goal: Mental status will improve Outcome: Not Met (add Reason) Goal: Verbalization of understanding the information provided will improve Outcome: Not Met (add Reason)   Problem: Activity: Goal: Sleeping patterns will improve Outcome: Progressing   Problem: Coping: Goal: Ability to verbalize frustrations and anger appropriately will improve Outcome: Not Met (add Reason)   Problem: Safety: Goal: Periods of time without injury will increase Outcome: Progressing

## 2024-08-31 NOTE — Group Note (Signed)

## 2024-08-31 NOTE — Plan of Care (Signed)
   Problem: Education: Goal: Emotional status will improve Outcome: Progressing Goal: Mental status will improve Outcome: Progressing Goal: Verbalization of understanding the information provided will improve Outcome: Progressing

## 2024-08-31 NOTE — Progress Notes (Signed)
°   08/31/24 1020  Psych Admission Type (Psych Patients Only)  Admission Status Involuntary  Psychosocial Assessment  Patient Complaints None  Eye Contact Brief  Facial Expression Flat  Affect Preoccupied  Speech Soft  Interaction No initiation;Avoidant;Guarded  Motor Activity Pacing;Restless  Appearance/Hygiene In scrubs  Behavior Characteristics Unwilling to participate;Pacing  Mood Preoccupied  Thought Process  Coherency Blocking  Content Preoccupation  Delusions None reported or observed  Perception WDL  Hallucination None reported or observed  Judgment Limited  Confusion None  Danger to Self  Current suicidal ideation? Denies  Agreement Not to Harm Self Yes  Description of Agreement Verbal  Danger to Others  Danger to Others None reported or observed

## 2024-08-31 NOTE — Group Note (Signed)
 [facility] LCSW Group Therapy Note   Group Date: 08/31/2024 Start Time: 1300 End Time: 1400   Type of Therapy/Topic:  Group Therapy:  Emotion Regulation  Participation Level:  Did Not Attend   Mood:  Description of Group:    The purpose of this group is to assist patients in learning to regulate negative emotions and experience positive emotions. Patients will be guided to discuss ways in which they have been vulnerable to their negative emotions. These vulnerabilities will be juxtaposed with experiences of positive emotions or situations, and patients challenged to use positive emotions to combat negative ones. Special emphasis will be placed on coping with negative emotions in conflict situations, and patients will process healthy conflict resolution skills.  Therapeutic Goals: Patient will identify two positive emotions or experiences to reflect on in order to balance out negative emotions:  Patient will label two or more emotions that they find the most difficult to experience:  Patient will be able to demonstrate positive conflict resolution skills through discussion or role plays:   Summary of Patient Progress:   Patient did not attend.     Therapeutic Modalities:   Cognitive Behavioral Therapy Feelings Identification Dialectical Behavioral Therapy   Alveta CHRISTELLA Kerns, LCSW

## 2024-09-01 MED ORDER — HALOPERIDOL DECANOATE 100 MG/ML IM SOLN
100.0000 mg | Freq: Once | INTRAMUSCULAR | Status: AC
  Administered 2024-09-01: 100 mg via INTRAMUSCULAR
  Filled 2024-09-01: qty 1

## 2024-09-01 NOTE — Group Note (Signed)
 Recreation Therapy Group Note   Group Topic:Stress Management  Group Date: 09/01/2024 Start Time: 1530 End Time: 1605 Facilitators: Celestia Jeoffrey BRAVO, LRT, CTRS Location: Dayroom  Group Description: Yoga. LRT and patients discussed the benefits of yoga and how it differs from strength exercises. LRT educated patients on the mental and physical benefits of yoga and deep breathing and how it can be used as a associate professor. LRT instructed patients on different stretching and yoga poses to complete that focused on all parts of the body, as well as deep breathing. Pt encouraged to stop movement at any time if they feel discomfort or pain.   Goal Area(s) Addressed: Patient will practice using relaxation technique. Patient will identify a new coping skill.  Patient will follow multistep directions to reduce anxiety and stress.   Affect/Mood: N/A   Participation Level: Did not attend    Clinical Observations/Individualized Feedback: Patient did not attend group.   Plan: Continue to engage patient in RT group sessions 2-3x/week.   Jeoffrey BRAVO Celestia, LRT, CTRS 09/01/2024 5:17 PM

## 2024-09-01 NOTE — Progress Notes (Signed)
°   09/01/24 1800  Psych Admission Type (Psych Patients Only)  Admission Status Involuntary  Psychosocial Assessment  Patient Complaints Restlessness;Worrying  Eye Contact Fair  Facial Expression Blank  Affect Flat  Speech Logical/coherent;Soft  Interaction Isolative (patient isolates to room, except for meals and medication.)  Motor Activity Pacing;Restless;Slow (patient paces the unit throughout the day.)  Appearance/Hygiene In scrubs;Other (Comment) (patient initially in scrubs, but after hearing about getting Haldol  injection, he has changed into regular clothes multiple times.)  Behavior Characteristics Pacing;Restless;Cooperative  Mood Preoccupied;Pleasant (patient preoccupied with today being his discharge date and asking for his paperwork.)  Aggressive Behavior  Effect No apparent injury  Thought Process  Coherency WDL  Content Preoccupation  Delusions None reported or observed  Perception WDL  Hallucination None reported or observed  Judgment WDL  Confusion None  Danger to Self  Current suicidal ideation? Denies  Self-Injurious Behavior No self-injurious ideation or behavior indicators observed or expressed   Agreement Not to Harm Self Yes  Description of Agreement Verbal  Danger to Others  Danger to Others None reported or observed  Danger to Others Abnormal  Harmful Behavior to others No threats or harm toward other people  Destructive Behavior No threats or harm toward property

## 2024-09-01 NOTE — Plan of Care (Signed)

## 2024-09-01 NOTE — Progress Notes (Signed)
 Patient has come up to the nurses station, twice asking this writer to give him his discharge papers so that he can go. This clinical research associate once again explained to patient that he is scheduled for discharge on Monday. CSW notified this clinical research associate, earlier in the shift, that patient has come up to her about six times asking the same question.

## 2024-09-01 NOTE — Progress Notes (Signed)
 Patient has been pacing the unit, changing outfits ever since this writer mentioned he was going to get a Haldol  long-acting injection.

## 2024-09-01 NOTE — Group Note (Signed)
 Date:  09/01/2024 Time:  10:36 AM  Group Topic/Focus:  Identifying Needs:   The focus of this group is to help patients identify their personal needs that have been historically problematic and identify healthy behaviors to address their needs.    Participation Level:  Did Not Attend   Eric Pineda 09/01/2024, 10:36 AM

## 2024-09-01 NOTE — Plan of Care (Signed)
°  Problem: Education: Goal: Emotional status will improve Outcome: Not Met (add Reason) Goal: Mental status will improve Outcome: Not Met (add Reason) Goal: Verbalization of understanding the information provided will improve Outcome: Not Met (add Reason)   Problem: Coping: Goal: Ability to verbalize frustrations and anger appropriately will improve Outcome: Progressing Goal: Ability to demonstrate self-control will improve Outcome: Progressing   Problem: Safety: Goal: Periods of time without injury will increase Outcome: Progressing

## 2024-09-01 NOTE — Progress Notes (Signed)
°   09/01/24 2200  Psych Admission Type (Psych Patients Only)  Admission Status Involuntary  Psychosocial Assessment  Patient Complaints Anxiety;None;Isolation  Eye Contact Brief  Facial Expression Flat  Affect Preoccupied  Speech Soft  Interaction No initiation;Avoidant  Motor Activity Restless  Appearance/Hygiene In scrubs  Behavior Characteristics Cooperative  Mood Preoccupied  Thought Process  Coherency Blocking  Content Preoccupation  Delusions None reported or observed  Perception WDL  Hallucination None reported or observed  Judgment Limited  Confusion None  Danger to Self  Current suicidal ideation? Denies  Agreement Not to Harm Self Yes  Description of Agreement verbal  Danger to Others  Danger to Others None reported or observed

## 2024-09-01 NOTE — Progress Notes (Signed)
 Patient was found in bed when this writer went to get him for scheduled morning medication, in which he said he would come up and get it.

## 2024-09-01 NOTE — Group Note (Signed)
 Date:  09/01/2024 Time:  8:42 PM  Additional Comments:  DID NOT ATTEND GRP   Butler LITTIE Gelineau 09/01/2024, 8:42 PM

## 2024-09-01 NOTE — Group Note (Signed)
 Recreation Therapy Group Note   Group Topic:Leisure Education  Group Date: 09/01/2024 Start Time: 1040 End Time: 1135 Facilitators: Celestia Jeoffrey BRAVO, LRT, CTRS Location: Craft Room   Group Description: Leisure. Patients were given the option to choose from journaling, coloring, drawing, making origami, playing with playdoh, listening to music or singing karaoke. LRT and pts discussed the meaning of leisure, the importance of participating in leisure during their free time/when they're outside of the hospital, as well as how our leisure interests can also serve as coping skills.   Goal Area(s) Addressed:  Patient will identify a current leisure interest.  Patient will learn the definition of leisure. Patient will practice making a positive decision. Patient will have the opportunity to try a new leisure activity. Patient will communicate with peers and LRT.    Affect/Mood: N/A   Participation Level: Did not attend    Clinical Observations/Individualized Feedback: Patient did not attend group.   Plan: Continue to engage patient in RT group sessions 2-3x/week.   [address], LRT, CTRS 09/01/2024 1:21 PM

## 2024-09-01 NOTE — Progress Notes (Signed)
 Patient ID: Eric Pineda, male   DOB: 18-Aug-1996, 28 y.o.   MRN: [id number] [place] MD Progress Note  09/01/2024 10:42 PM Eric Pineda  MRN:  [id number]   Subjective:  Chart reviewed, case discussed in multidisciplinary meeting, patient seen during rounds.   12/12: On interview today, patient is found in room.  He is calm and cooperative.  He continues to be minimally engaged in interview.  He denies SI/HI/plan and denies hallucinations.  He reports stable sleep and appetite.  He denies current symptoms of depression or anxiety.  He reports tolerating medication regimen well without adverse effects.  Per nursing report today, patient has been medication compliant and has maintained safe behaviors on unit.  Patient is agreeable to Haldol  LAI today.  He does not voice any concerns or complaints at this time.  12/11: On interview today, patient is noted to be resting in his room. He is alert, calm, and cooperative. He continues to be minimally engaged in interview. He appears internally preoccupied. He reports tolerating current medication regimen well without adverse effects. He denies SI/HI/plan and denies hallucinations. He has maintained safe behaviors on the unit.  Provider and CSW Eric Pineda contacted patient's mother to give update and discharge planning. Call was assisted with the use of [address] [place], #[id number]. Patient's mother confirmed patient will return to live with her upon hospital discharge. Mother does not voice any safety concerns and states patient does not have access to firearms or other weapons. She is agreeable to patient's upcoming discharge in the next few days. CSW explained that our recommendation is for patient to have a guardianship. An APS report was made by CSW but the case was not accepted. CSW explained guardianship and process, and reason for this recommendation. CSW discussed treatment recommendations including medication and outpatient follow-up with  mother and how guardianship can support this. All questions answered.      12/10: On interview today, patient is noted to be pacing in hallway.  Patient stops briefly to engage in interview with provider, though remains minimal.  He reports tolerating current medications well without adverse effects.  He denies SI/HI/plan and denies hallucinations.  He reports stable sleep and appetite.  He does not voice any concerns or complaints at this time.  Patient is not displaying any unsafe behaviors.  He continues to display thought blocking and appears internally preoccupied.  Per social work team, APS has declined to take patient's case.   12/9: Patient is noted to be walking in the hallway and continues to pace.  Patient does not stop to talk to the provider and does not respond to questions related to mental health.  Patient is not endorsing SI/HI and is not displaying any unsafe behaviors.  Patient is noted to be responding to stimuli intermittently. 12/8: On interview today, patient is found pacing in the hallway.  He continues to be minimally engaged in interview.  No unsafe behaviors have been reported.  He continues to display thought blocking and appears internally preoccupied.  Patient reports tolerating medication regimen well without adverse effects.  Will plan to increase Haldol  to 10 mg twice daily starting tomorrow.  He denies SI/HI/plan and denies hallucinations.  He reports good sleep and appetite.  Patient then walks away during interview stating okay thank you.  12/7: On interview today, patient is found pacing in the hallway.  He is minimally engaged in interview. He reports tolerating medication regimen well without adverse effects.  He reports stable sleep and appetite. He  denies SI/HI/plan and denies hallucinations.  He does not voice any concerns or complaints at this time.  He continues to display thought blocking and appears internally preoccupied. Provider discussed plan of care with  patient's sister, Eric Pineda, and mother, Eric Pineda. Social work team has made APS report.  12/6: On interview today, patient is found pacing in hallway.  He continues to be minimally engaged in interview and is calm and cooperative.  He is alert and oriented to person.  He is unable to discuss reason for being in hospital.  He continues to display thought blocking and appears to be internally preoccupied.  He denies SI/HI/plan and denies hallucinations.  He denies symptoms of depression or anxiety.  He reports good sleep and appetite.  He does not voice any concerns or complaints at this time.  He is tolerating medication regimen well without adverse effects.  Current medication regimen includes Invega  9 mg once daily and Haldol  5 mg once daily.    Past Psychiatric History: see h&P Family History: History reviewed. No pertinent family history. Social History:  Social History   Substance and Sexual Activity  Alcohol Use Never     Social History   Substance and Sexual Activity  Drug Use Yes    Social History   Socioeconomic History   Marital status: Single    Spouse name: Not on file   Number of children: Not on file   Years of education: Not on file   Highest education level: Not on file  Occupational History   Not on file  Tobacco Use   Smoking status: Never   Smokeless tobacco: Never   Tobacco comments:    Patient denied current use of tobacco  Vaping Use   Vaping status: Never Used  Substance and Sexual Activity   Alcohol use: Never   Drug use: Yes   Sexual activity: Not on file  Other Topics Concern   Not on file  Social History Narrative   Not on file   Social Drivers of Health   Tobacco Use: Low Risk (08/14/2024)   Patient History    Smoking Tobacco Use: Never    Smokeless Tobacco Use: Never    Passive Exposure: Not on file  Financial Resource Strain: Not on file  Food Insecurity: Food Insecurity Present (08/14/2024)   Epic    Worried About Community Education Officer in the Last Year: Sometimes true    [employer] of Food in the Last Year: Never true  Transportation Needs: No Transportation Needs (08/14/2024)   Epic    Lack of Transportation (Medical): No    Lack of Transportation (Non-Medical): No  Physical Activity: Not on file  Stress: Not on file  Social Connections: Unknown (01/17/2024)   Social Connection and Isolation Panel    Frequency of Communication with Friends and Family: Patient declined    Frequency of Social Gatherings with Friends and Family: Patient declined    Attends Religious Services: Patient declined    Active Member of Clubs or Organizations: Patient declined    Attends Banker Meetings: Patient declined    Marital Status: Never married  Depression (PHQ2-9): Not on file  Alcohol Screen: Low Risk (08/14/2024)   Alcohol Screen    Last Alcohol Screening Score (AUDIT): 0  Housing: Low Risk (08/14/2024)   Epic    Unable to Pay for Housing in the Last Year: No    Number of Times Moved in the Last Year: 0    Homeless  in the Last Year: No  Utilities: Not At Risk (08/14/2024)   Epic    Threatened with loss of utilities: No  Health Literacy: Not on file   Past Medical History:  Past Medical History:  Diagnosis Date   Bizarre behavior 12/02/2019   History reviewed. No pertinent surgical history.  Current Medications: Current Facility-Administered Medications  Medication Dose Route Frequency Provider Last Rate Last Admin   acetaminophen  (TYLENOL ) tablet 650 mg  650 mg Oral Q6H PRN Bobbitt, Shalon E, NP       alum & mag hydroxide-simeth (MAALOX/MYLANTA) 200-200-20 MG/5ML suspension 30 mL  30 mL Oral Q4H PRN Bobbitt, Shalon E, NP       haloperidol  (HALDOL ) tablet 5 mg  5 mg Oral TID PRN Bobbitt, Shalon E, NP       And   diphenhydrAMINE  (BENADRYL ) capsule 50 mg  50 mg Oral TID PRN Bobbitt, Shalon E, NP       haloperidol  lactate (HALDOL ) injection 5 mg  5 mg Intramuscular TID PRN Bobbitt, Shalon E, NP        And   diphenhydrAMINE  (BENADRYL ) injection 50 mg  50 mg Intramuscular TID PRN Bobbitt, Shalon E, NP       And   LORazepam  (ATIVAN ) injection 2 mg  2 mg Intramuscular TID PRN Bobbitt, Shalon E, NP       haloperidol  lactate (HALDOL ) injection 10 mg  10 mg Intramuscular TID PRN Bobbitt, Shalon E, NP       And   diphenhydrAMINE  (BENADRYL ) injection 50 mg  50 mg Intramuscular TID PRN Bobbitt, Shalon E, NP       And   LORazepam  (ATIVAN ) injection 2 mg  2 mg Intramuscular TID PRN Bobbitt, Shalon E, NP       hydrOXYzine  (ATARAX ) tablet 25 mg  25 mg Oral TID PRN Bobbitt, Shalon E, NP       magnesium  hydroxide (MILK OF MAGNESIA) suspension 30 mL  30 mL Oral Daily PRN Bobbitt, Shalon E, NP       paliperidone  (INVEGA ) 24 hr tablet 9 mg  9 mg Oral Daily Montague, Izek Corvino J, NP   9 mg at 09/01/24 1133   traZODone  (DESYREL ) tablet 50 mg  50 mg Oral QHS PRN Bobbitt, Shalon E, NP   50 mg at 08/28/24 2121    Lab Results:  No results found for this or any previous visit (from the past 48 hours).   Blood Alcohol level:  Lab Results  Component Value Date   [facility] <15 08/13/2024   ETH <15 01/14/2024    Metabolic Disorder Labs: Lab Results  Component Value Date   HGBA1C 5.1 08/21/2024   MPG 100 08/21/2024   MPG 96.8 01/18/2023   No results found for: PROLACTIN Lab Results  Component Value Date   CHOL 155 08/21/2024   TRIG 73 08/21/2024   HDL 46 08/21/2024   CHOLHDL 3.4 08/21/2024   VLDL 15 08/21/2024   LDLCALC 95 08/21/2024   LDLCALC 81 01/18/2023    Physical Findings: AIMS:  , ,  ,  ,    CIWA:    COWS:      Psychiatric Specialty Exam:  Presentation  General Appearance:  Appropriate for Environment  Eye Contact: Fleeting  Speech: Clear and Coherent  Speech Volume: Normal    Mood and Affect  Mood: Euthymic  Affect: Constricted   Thought Process  Thought Processes: Other (comment) (Thought blocking)  Orientation:Partial (Oriented to person; unable to give date,  location, or  situation)  Thought Content:Scattered  Hallucinations: Denies Ideas of Reference:None  Suicidal Thoughts: Denies Homicidal Thoughts: Denies  Sensorium  Memory: Immediate Fair  Judgment: Impaired  Insight: Lacking   Executive Functions  Concentration: Fair  Attention Span: Fair  Recall: Fair  Fund of Knowledge: Poor  Language: Poor   Psychomotor Activity  Psychomotor Activity: Normal Musculoskeletal: Strength & Muscle Tone: within normal limits Gait & Station: normal Assets  Assets: Social Support    Physical Exam: Physical Exam Pulmonary:     Effort: Pulmonary effort is normal.  Neurological:     Mental Status: He is alert.    Review of Systems  Respiratory:  Negative for shortness of breath.   Cardiovascular:  Negative for chest pain.  Psychiatric/Behavioral:  Negative for suicidal ideas.    Blood pressure (!) 126/93, pulse 100, temperature 97.9 F (36.6 C), temperature source Oral, resp. rate 18, height 5' 10 (1.778 m), weight 75.8 kg, SpO2 97%. Body mass index is 23.96 kg/m.  Diagnosis: Principal Problem:   Undifferentiated schizophrenia (HCC)   PLAN: Safety and Monitoring:  -- Involuntary admission to inpatient psychiatric unit for safety, stabilization and treatment  -- Daily contact with patient to assess and evaluate symptoms and progress in treatment  -- Patient's case to be discussed in multi-disciplinary team meeting  -- Observation Level : q15 minute checks  -- Vital signs:  q12 hours  -- Precautions: suicide, elopement, and assault -- Encouraged patient to participate in unit milieu and in scheduled group therapies  2. Psychiatric Treatment:  Scheduled Medications: - Continue Invega  9 mg once daily  - Haloperidol  decanoate LAI administered today, oral haloperidol  discontinued     -- The risks/benefits/side-effects/alternatives to this medication were discussed in detail with the patient and time was given  for questions. The patient consents to medication trial.  3. Medical Issues Being Addressed:  No acute concerns.  4. Discharge Planning:   -- Social work and case management to assist with discharge planning and identification of hospital follow-up needs prior to discharge  -- Estimated LOS: Unable to estimate at this time  [employer], PA-C This note was created using Scientist, clinical (histocompatibility and immunogenetics). Please excuse any inadvertent transcription errors. Case was discussed with attending physician Dr. Jadapalle who is agreeable with current plan.

## 2024-09-02 NOTE — Group Note (Signed)
 Date:  09/02/2024 Time:  4:13 PM  Group Topic/Focus:  Making Healthy Choices:   The focus of this group is to help patients identify negative/unhealthy choices they were using prior to admission and identify positive/healthier coping strategies to replace them upon discharge.    Participation Level:  Minimal  Participation Quality:  Appropriate  Affect:  Appropriate  Cognitive:  Appropriate  Insight: Appropriate  Engagement in Group:  Limited  Modes of Intervention:  Activity and Socialization  Additional Comments:    Deitra Caron Mainland 09/02/2024, 4:13 PM

## 2024-09-02 NOTE — Group Note (Unsigned)
 Date:  09/02/2024 Time:  10:34 AM  Group Topic/Focus:   Wellness Toolbox:   The focus of this group is to discuss various aspects of wellness, balancing those aspects and exploring ways to increase the ability to experience wellness.  Patients will create a wellness toolbox for use upon discharge.     Participation Level:  {[facility] PARTICIPATION OZCZO:77735}  Participation Quality:  {[facility] PARTICIPATION QUALITY:22265}  Affect:  {[facility] AFFECT:22266}  Cognitive:  {[facility] COGNITIVE:22267}  Insight: {[facility] Insight2:20797}  Engagement in Group:  {[facility] ENGAGEMENT IN HMNLE:77731}  Modes of Intervention:  {[facility] MODES OF INTERVENTION:22269}  Additional Comments:  ***  Eric Pineda 09/02/2024, 10:34 AM

## 2024-09-02 NOTE — Group Note (Signed)
 Date:  09/02/2024 Time:  9:56 PM  Group Topic/Focus:  Wrap-Up Group:   The focus of this group is to help patients review their daily goal of treatment and discuss progress on daily workbooks.    Additional Comments:  Didn't Attend  Kerri Katz 09/02/2024, 9:56 PM

## 2024-09-02 NOTE — Plan of Care (Signed)

## 2024-09-02 NOTE — Progress Notes (Signed)
 [facility] MD Progress Note  09/02/2024 1:11 PM Eric Pineda  MRN:  [id number]   Subjective:  Chart reviewed, case discussed in multidisciplinary meeting, patient seen during rounds.   12/13: On interview, patient found walking in hallway. Patient expressed wanting to talk in hallway. Patient reported he was good. Denies concerns/complaints. Denies SI, HI, and AVH. No behavioral concerns reported and patient taking medications as prescribed. Patient received Haldol  Dec 12/12 - negative evidence of TD; patient did not want to engage in AIMS assessment.   12/12: On interview today, patient is found in room.  He is calm and cooperative.  He continues to be minimally engaged in interview.  He denies SI/HI/plan and denies hallucinations.  He reports stable sleep and appetite.  He denies current symptoms of depression or anxiety.  He reports tolerating medication regimen well without adverse effects.  Per nursing report today, patient has been medication compliant and has maintained safe behaviors on unit.  Patient is agreeable to Haldol  LAI today.  He does not voice any concerns or complaints at this time.  12/11: On interview today, patient is noted to be resting in his room. He is alert, calm, and cooperative. He continues to be minimally engaged in interview. He appears internally preoccupied. He reports tolerating current medication regimen well without adverse effects. He denies SI/HI/plan and denies hallucinations. He has maintained safe behaviors on the unit.  Provider and CSW Sherryle Chary contacted patient's mother to give update and discharge planning. Call was assisted with the use of [address] [place], #[id number]. Patient's mother confirmed patient will return to live with her upon hospital discharge. Mother does not voice any safety concerns and states patient does not have access to firearms or other weapons. She is agreeable to patient's upcoming discharge in the next few days. CSW  explained that our recommendation is for patient to have a guardianship. An APS report was made by CSW but the case was not accepted. CSW explained guardianship and process, and reason for this recommendation. CSW discussed treatment recommendations including medication and outpatient follow-up with mother and how guardianship can support this. All questions answered.      12/10: On interview today, patient is noted to be pacing in hallway.  Patient stops briefly to engage in interview with provider, though remains minimal.  He reports tolerating current medications well without adverse effects.  He denies SI/HI/plan and denies hallucinations.  He reports stable sleep and appetite.  He does not voice any concerns or complaints at this time.  Patient is not displaying any unsafe behaviors.  He continues to display thought blocking and appears internally preoccupied.  Per social work team, APS has declined to take patient's case.   12/9: Patient is noted to be walking in the hallway and continues to pace.  Patient does not stop to talk to the provider and does not respond to questions related to mental health.  Patient is not endorsing SI/HI and is not displaying any unsafe behaviors.  Patient is noted to be responding to stimuli intermittently. 12/8: On interview today, patient is found pacing in the hallway.  He continues to be minimally engaged in interview.  No unsafe behaviors have been reported.  He continues to display thought blocking and appears internally preoccupied.  Patient reports tolerating medication regimen well without adverse effects.  Will plan to increase Haldol  to 10 mg twice daily starting tomorrow.  He denies SI/HI/plan and denies hallucinations.  He reports good sleep and appetite.  Patient then walks  away during interview stating okay thank you.  12/7: On interview today, patient is found pacing in the hallway.  He is minimally engaged in interview. He reports tolerating medication  regimen well without adverse effects.  He reports stable sleep and appetite. He denies SI/HI/plan and denies hallucinations.  He does not voice any concerns or complaints at this time.  He continues to display thought blocking and appears internally preoccupied. Provider discussed plan of care with patient's sister, Eric Pineda, and mother, Eric Pineda. Social work team has made APS report.  12/6: On interview today, patient is found pacing in hallway.  He continues to be minimally engaged in interview and is calm and cooperative.  He is alert and oriented to person.  He is unable to discuss reason for being in hospital.  He continues to display thought blocking and appears to be internally preoccupied.  He denies SI/HI/plan and denies hallucinations.  He denies symptoms of depression or anxiety.  He reports good sleep and appetite.  He does not voice any concerns or complaints at this time.  He is tolerating medication regimen well without adverse effects.  Current medication regimen includes Invega  9 mg once daily and Haldol  5 mg once daily.    Past Psychiatric History: see h&P Family History: History reviewed. No pertinent family history. Social History:  Social History   Substance and Sexual Activity  Alcohol Use Never     Social History   Substance and Sexual Activity  Drug Use Yes    Social History   Socioeconomic History   Marital status: Single    Spouse name: Not on file   Number of children: Not on file   Years of education: Not on file   Highest education level: Not on file  Occupational History   Not on file  Tobacco Use   Smoking status: Never   Smokeless tobacco: Never   Tobacco comments:    Patient denied current use of tobacco  Vaping Use   Vaping status: Never Used  Substance and Sexual Activity   Alcohol use: Never   Drug use: Yes   Sexual activity: Not on file  Other Topics Concern   Not on file  Social History Narrative   Not on file   Social  Drivers of Health   Tobacco Use: Low Risk (08/14/2024)   Patient History    Smoking Tobacco Use: Never    Smokeless Tobacco Use: Never    Passive Exposure: Not on file  Financial Resource Strain: Not on file  Food Insecurity: Food Insecurity Present (08/14/2024)   Epic    Worried About Programme Researcher, Broadcasting/film/video in the Last Year: Sometimes true    [employer] of Food in the Last Year: Never true  Transportation Needs: No Transportation Needs (08/14/2024)   Epic    Lack of Transportation (Medical): No    Lack of Transportation (Non-Medical): No  Physical Activity: Not on file  Stress: Not on file  Social Connections: Unknown (01/17/2024)   Social Connection and Isolation Panel    Frequency of Communication with Friends and Family: Patient declined    Frequency of Social Gatherings with Friends and Family: Patient declined    Attends Religious Services: Patient declined    Active Member of Clubs or Organizations: Patient declined    Attends Banker Meetings: Patient declined    Marital Status: Never married  Depression (PHQ2-9): Not on file  Alcohol Screen: Low Risk (08/14/2024)   Alcohol Screen  Last Alcohol Screening Score (AUDIT): 0  Housing: Low Risk (08/14/2024)   Epic    Unable to Pay for Housing in the Last Year: No    Number of Times Moved in the Last Year: 0    Homeless in the Last Year: No  Utilities: Not At Risk (08/14/2024)   Epic    Threatened with loss of utilities: No  Health Literacy: Not on file   Past Medical History:  Past Medical History:  Diagnosis Date   Bizarre behavior 12/02/2019   History reviewed. No pertinent surgical history.  Current Medications: Current Facility-Administered Medications  Medication Dose Route Frequency Provider Last Rate Last Admin   acetaminophen  (TYLENOL ) tablet 650 mg  650 mg Oral Q6H PRN Bobbitt, Shalon E, NP       alum & mag hydroxide-simeth (MAALOX/MYLANTA) 200-200-20 MG/5ML suspension 30 mL  30 mL Oral Q4H PRN  Bobbitt, Shalon E, NP       haloperidol  (HALDOL ) tablet 5 mg  5 mg Oral TID PRN Bobbitt, Shalon E, NP       And   diphenhydrAMINE  (BENADRYL ) capsule 50 mg  50 mg Oral TID PRN Bobbitt, Shalon E, NP       haloperidol  lactate (HALDOL ) injection 5 mg  5 mg Intramuscular TID PRN Bobbitt, Shalon E, NP       And   diphenhydrAMINE  (BENADRYL ) injection 50 mg  50 mg Intramuscular TID PRN Bobbitt, Shalon E, NP       And   LORazepam  (ATIVAN ) injection 2 mg  2 mg Intramuscular TID PRN Bobbitt, Shalon E, NP       haloperidol  lactate (HALDOL ) injection 10 mg  10 mg Intramuscular TID PRN Bobbitt, Shalon E, NP       And   diphenhydrAMINE  (BENADRYL ) injection 50 mg  50 mg Intramuscular TID PRN Bobbitt, Shalon E, NP       And   LORazepam  (ATIVAN ) injection 2 mg  2 mg Intramuscular TID PRN Bobbitt, Shalon E, NP       hydrOXYzine  (ATARAX ) tablet 25 mg  25 mg Oral TID PRN Bobbitt, Shalon E, NP       magnesium  hydroxide (MILK OF MAGNESIA) suspension 30 mL  30 mL Oral Daily PRN Bobbitt, Shalon E, NP       paliperidone  (INVEGA ) 24 hr tablet 9 mg  9 mg Oral Daily Montague, Crystal J, NP   9 mg at 09/02/24 0848   traZODone  (DESYREL ) tablet 50 mg  50 mg Oral QHS PRN Bobbitt, Shalon E, NP   50 mg at 08/28/24 2121    Lab Results:  No results found for this or any previous visit (from the past 48 hours).   Blood Alcohol level:  Lab Results  Component Value Date   [facility] <15 08/13/2024   ETH <15 01/14/2024    Metabolic Disorder Labs: Lab Results  Component Value Date   HGBA1C 5.1 08/21/2024   MPG 100 08/21/2024   MPG 96.8 01/18/2023   No results found for: PROLACTIN Lab Results  Component Value Date   CHOL 155 08/21/2024   TRIG 73 08/21/2024   HDL 46 08/21/2024   CHOLHDL 3.4 08/21/2024   VLDL 15 08/21/2024   LDLCALC 95 08/21/2024   LDLCALC 81 01/18/2023    Physical Findings: AIMS:  , ,  ,  ,    CIWA:    COWS:      Psychiatric Specialty Exam:  Presentation  General Appearance:  Appropriate  for Environment  Eye Contact: Fleeting  Speech: Clear and Coherent  Speech Volume: Normal    Mood and Affect  Mood: Euthymic  Affect: Constricted   Thought Process  Thought Processes: Other (comment) (Thought blocking)  Orientation:Partial (Oriented to person; unable to give date, location, or situation)  Thought Content:Scattered  Hallucinations: Denies Ideas of Reference:None  Suicidal Thoughts: Denies Homicidal Thoughts: Denies  Sensorium  Memory: Immediate Fair  Judgment: Impaired  Insight: Lacking   Executive Functions  Concentration: Fair  Attention Span: Fair  Recall: Fair  Fund of Knowledge: Poor  Language: Poor   Psychomotor Activity  Psychomotor Activity: Normal Musculoskeletal: Strength & Muscle Tone: within normal limits Gait & Station: normal Assets  Assets: Social Support    Physical Exam: Physical Exam Pulmonary:     Effort: Pulmonary effort is normal.  Neurological:     Mental Status: He is alert.    Review of Systems  Respiratory:  Negative for shortness of breath.   Cardiovascular:  Negative for chest pain.  Gastrointestinal:  Negative for diarrhea, nausea and vomiting.  Psychiatric/Behavioral:  Negative for depression, hallucinations and suicidal ideas. The patient is not nervous/anxious.    Blood pressure 110/79, pulse 68, temperature 97.6 F (36.4 C), temperature source Oral, resp. rate 18, height 5' 10 (1.778 m), weight 75.8 kg, SpO2 98%. Body mass index is 23.96 kg/m.  Diagnosis: Principal Problem:   Undifferentiated schizophrenia (HCC)   PLAN: Safety and Monitoring:  -- Involuntary admission to inpatient psychiatric unit for safety, stabilization and treatment  -- Daily contact with patient to assess and evaluate symptoms and progress in treatment  -- Patient's case to be discussed in multi-disciplinary team meeting  -- Observation Level : q15 minute checks  -- Vital signs:  q12 hours  --  Precautions: suicide, elopement, and assault -- Encouraged patient to participate in unit milieu and in scheduled group therapies  2. Psychiatric Treatment:  Scheduled Medications: - Continue Invega  9 mg once daily  - Haloperidol  decanoate LAI administered 09/01/2024    -- The risks/benefits/side-effects/alternatives to this medication were discussed in detail with the patient and time was given for questions. The patient consents to medication trial.  3. Medical Issues Being Addressed:  No acute concerns.  4. Discharge Planning:   -- Social work and case management to assist with discharge planning and identification of hospital follow-up needs prior to discharge  -- Estimated LOS: Unable to estimate at this time Discharge planned for Monday

## 2024-09-03 NOTE — Plan of Care (Signed)

## 2024-09-03 NOTE — [facility] Counselor (Signed)
 CSW touched base with patient's mother Juwann Sherk utilizing interpretation services, interpreter ID [phone] to engage in safe discharge planning. Mother reported that she has no safety concerns with patient's pending discharge. Mother reported that she will provide transportation to the patient at discharge. Mother reported that there are no weapons currently in the home. Mother reported no present safety concerns. Mother is in agreement with the 09/04/24 discharge date.   Patient is continuing to decline outpatient services. CSW has provided walk in information for patient to access outpatient support if needed or desired at a later date.   Chirstina Haan, MSW, LCSWA 09/03/2024 3:05 PM

## 2024-09-03 NOTE — Plan of Care (Signed)
  Problem: Activity: Goal: Sleeping patterns will improve Outcome: Progressing   

## 2024-09-03 NOTE — Group Note (Signed)
 Date:  09/03/2024 Time:  5:39 PM  Group Topic/Focus:  Emotional Education:   The focus of this group is to discuss what feelings/emotions are, and how they are experienced.    Participation Level:  Did Not Attend   Eric Pineda 09/03/2024, 5:39 PM

## 2024-09-03 NOTE — Group Note (Signed)
 Date:  09/03/2024 Time:  5:43 PM  Group Topic/Focus:  Overcoming Stress:   The focus of this group is to define stress and help patients assess their triggers.    Participation Level:  Did Not Attend  [name] [name] Terrica Duecker 09/03/2024, 5:43 PM

## 2024-09-04 MED ORDER — PALIPERIDONE ER 9 MG PO TB24: 9.0000 mg | ORAL_TABLET | Freq: Every day | ORAL | 0 refills | Status: AC

## 2024-09-04 NOTE — Plan of Care (Signed)
  Problem: Activity: Goal: Sleeping patterns will improve Outcome: Progressing   

## 2024-09-04 NOTE — Group Note (Signed)
 [facility] LCSW Group Therapy Note    Group Date: 09/04/2024 Start Time: 1300 End Time: 1400  Type of Therapy and Topic:  Group Therapy:  Overcoming Obstacles  Participation Level:  [facility] PARTICIPATION LEVEL: None  Description of Group:   In this group patients will be encouraged to explore what they see as obstacles to their own wellness and recovery. They will be guided to discuss their thoughts, feelings, and behaviors related to these obstacles. The group will process together ways to cope with barriers, with attention given to specific choices patients can make. Each patient will be challenged to identify changes they are motivated to make in order to overcome their obstacles. This group will be process-oriented, with patients participating in exploration of their own experiences as well as giving and receiving support and challenge from other group members.  Therapeutic Goals: 1. Patient will identify personal and current obstacles as they relate to admission. 2. Patient will identify barriers that currently interfere with their wellness or overcoming obstacles.  3. Patient will identify feelings, thought process and behaviors related to these barriers. 4. Patient will identify two changes they are willing to make to overcome these obstacles:    Summary of Patient Progress Patient was in and out of group. He did not participate in the discussion.   Therapeutic Modalities:   Cognitive Behavioral Therapy Solution Focused Therapy Motivational Interviewing Relapse Prevention Therapy   Nadara JONELLE Fam, LCSW

## 2024-09-04 NOTE — Progress Notes (Addendum)
 [facility] MD Progress Note  09/03/2024 2:00 PM Eric Pineda  MRN:  [id number]   Subjective:  Chart reviewed, case discussed in multidisciplinary meeting, patient seen during rounds.   12/14: Patient found in hallway. He expresses he is good. Denies any concerns. Denies SI, HI, and AVH. He has been taking medications as prescribed. No evidence of TD or EPS at this time - patient denies symptoms but does not want to engage in AIMS assessment. See social work note for discussion with mother.   12/13: On interview, patient found walking in hallway. Patient expressed wanting to talk in hallway. Patient reported he was good. Denies concerns/complaints. Denies SI, HI, and AVH. No behavioral concerns reported and patient taking medications as prescribed. Patient received Haldol  Dec 12/12 - negative evidence of TD; patient did not want to engage in AIMS assessment.   12/12: On interview today, patient is found in room.  He is calm and cooperative.  He continues to be minimally engaged in interview.  He denies SI/HI/plan and denies hallucinations.  He reports stable sleep and appetite.  He denies current symptoms of depression or anxiety.  He reports tolerating medication regimen well without adverse effects.  Per nursing report today, patient has been medication compliant and has maintained safe behaviors on unit.  Patient is agreeable to Haldol  LAI today.  He does not voice any concerns or complaints at this time.  12/11: On interview today, patient is noted to be resting in his room. He is alert, calm, and cooperative. He continues to be minimally engaged in interview. He appears internally preoccupied. He reports tolerating current medication regimen well without adverse effects. He denies SI/HI/plan and denies hallucinations. He has maintained safe behaviors on the unit.  Provider and CSW Sherryle Chary contacted patient's mother to give update and discharge planning. Call was assisted with the use  of [address] [place], #[id number]. Patient's mother confirmed patient will return to live with her upon hospital discharge. Mother does not voice any safety concerns and states patient does not have access to firearms or other weapons. She is agreeable to patient's upcoming discharge in the next few days. CSW explained that our recommendation is for patient to have a guardianship. An APS report was made by CSW but the case was not accepted. CSW explained guardianship and process, and reason for this recommendation. CSW discussed treatment recommendations including medication and outpatient follow-up with mother and how guardianship can support this. All questions answered.      12/10: On interview today, patient is noted to be pacing in hallway.  Patient stops briefly to engage in interview with provider, though remains minimal.  He reports tolerating current medications well without adverse effects.  He denies SI/HI/plan and denies hallucinations.  He reports stable sleep and appetite.  He does not voice any concerns or complaints at this time.  Patient is not displaying any unsafe behaviors.  He continues to display thought blocking and appears internally preoccupied.  Per social work team, APS has declined to take patient's case.   12/9: Patient is noted to be walking in the hallway and continues to pace.  Patient does not stop to talk to the provider and does not respond to questions related to mental health.  Patient is not endorsing SI/HI and is not displaying any unsafe behaviors.  Patient is noted to be responding to stimuli intermittently. 12/8: On interview today, patient is found pacing in the hallway.  He continues to be minimally engaged in interview.  No  unsafe behaviors have been reported.  He continues to display thought blocking and appears internally preoccupied.  Patient reports tolerating medication regimen well without adverse effects.  Will plan to increase Haldol  to 10 mg twice  daily starting tomorrow.  He denies SI/HI/plan and denies hallucinations.  He reports good sleep and appetite.  Patient then walks away during interview stating okay thank you.  12/7: On interview today, patient is found pacing in the hallway.  He is minimally engaged in interview. He reports tolerating medication regimen well without adverse effects.  He reports stable sleep and appetite. He denies SI/HI/plan and denies hallucinations.  He does not voice any concerns or complaints at this time.  He continues to display thought blocking and appears internally preoccupied. Provider discussed plan of care with patient's sister, Elmor Kost, and mother, [name]. Social work team has made APS report.  12/6: On interview today, patient is found pacing in hallway.  He continues to be minimally engaged in interview and is calm and cooperative.  He is alert and oriented to person.  He is unable to discuss reason for being in hospital.  He continues to display thought blocking and appears to be internally preoccupied.  He denies SI/HI/plan and denies hallucinations.  He denies symptoms of depression or anxiety.  He reports good sleep and appetite.  He does not voice any concerns or complaints at this time.  He is tolerating medication regimen well without adverse effects.  Current medication regimen includes Invega  9 mg once daily and Haldol  5 mg once daily.    Past Psychiatric History: see h&P Family History: History reviewed. No pertinent family history. Social History:  Social History   Substance and Sexual Activity  Alcohol Use Never     Social History   Substance and Sexual Activity  Drug Use Yes    Social History   Socioeconomic History   Marital status: Single    Spouse name: Not on file   Number of children: Not on file   Years of education: Not on file   Highest education level: Not on file  Occupational History   Not on file  Tobacco Use   Smoking status: Never    Smokeless tobacco: Never   Tobacco comments:    Patient denied current use of tobacco  Vaping Use   Vaping status: Never Used  Substance and Sexual Activity   Alcohol use: Never   Drug use: Yes   Sexual activity: Not on file  Other Topics Concern   Not on file  Social History Narrative   Not on file   Social Drivers of Health   Tobacco Use: Low Risk (08/14/2024)   Patient History    Smoking Tobacco Use: Never    Smokeless Tobacco Use: Never    Passive Exposure: Not on file  Financial Resource Strain: Not on file  Food Insecurity: Food Insecurity Present (08/14/2024)   Epic    Worried About Programme Researcher, Broadcasting/film/video in the Last Year: Sometimes true    [employer] of Food in the Last Year: Never true  Transportation Needs: No Transportation Needs (08/14/2024)   Epic    Lack of Transportation (Medical): No    Lack of Transportation (Non-Medical): No  Physical Activity: Not on file  Stress: Not on file  Social Connections: Unknown (01/17/2024)   Social Connection and Isolation Panel    Frequency of Communication with Friends and Family: Patient declined    Frequency of Social Gatherings with Friends and Family:  Patient declined    Attends Religious Services: Patient declined    Active Member of Clubs or Organizations: Patient declined    Attends Banker Meetings: Patient declined    Marital Status: Never married  Depression (PHQ2-9): Not on file  Alcohol Screen: Low Risk (08/14/2024)   Alcohol Screen    Last Alcohol Screening Score (AUDIT): 0  Housing: Low Risk (08/14/2024)   Epic    Unable to Pay for Housing in the Last Year: No    Number of Times Moved in the Last Year: 0    Homeless in the Last Year: No  Utilities: Not At Risk (08/14/2024)   Epic    Threatened with loss of utilities: No  Health Literacy: Not on file   Past Medical History:  Past Medical History:  Diagnosis Date   Bizarre behavior 12/02/2019   History reviewed. No pertinent surgical  history.  Current Medications: Current Facility-Administered Medications  Medication Dose Route Frequency Provider Last Rate Last Admin   acetaminophen  (TYLENOL ) tablet 650 mg  650 mg Oral Q6H PRN Bobbitt, Shalon E, NP       alum & mag hydroxide-simeth (MAALOX/MYLANTA) 200-200-20 MG/5ML suspension 30 mL  30 mL Oral Q4H PRN Bobbitt, Shalon E, NP       haloperidol  (HALDOL ) tablet 5 mg  5 mg Oral TID PRN Bobbitt, Shalon E, NP       And   diphenhydrAMINE  (BENADRYL ) capsule 50 mg  50 mg Oral TID PRN Bobbitt, Shalon E, NP       haloperidol  lactate (HALDOL ) injection 5 mg  5 mg Intramuscular TID PRN Bobbitt, Shalon E, NP       And   diphenhydrAMINE  (BENADRYL ) injection 50 mg  50 mg Intramuscular TID PRN Bobbitt, Shalon E, NP       And   LORazepam  (ATIVAN ) injection 2 mg  2 mg Intramuscular TID PRN Bobbitt, Shalon E, NP       haloperidol  lactate (HALDOL ) injection 10 mg  10 mg Intramuscular TID PRN Bobbitt, Shalon E, NP       And   diphenhydrAMINE  (BENADRYL ) injection 50 mg  50 mg Intramuscular TID PRN Bobbitt, Shalon E, NP       And   LORazepam  (ATIVAN ) injection 2 mg  2 mg Intramuscular TID PRN Bobbitt, Shalon E, NP       hydrOXYzine  (ATARAX ) tablet 25 mg  25 mg Oral TID PRN Bobbitt, Shalon E, NP       magnesium  hydroxide (MILK OF MAGNESIA) suspension 30 mL  30 mL Oral Daily PRN Bobbitt, Shalon E, NP       paliperidone  (INVEGA ) 24 hr tablet 9 mg  9 mg Oral Daily Montague, Crystal J, NP   9 mg at 09/04/24 9076   traZODone  (DESYREL ) tablet 50 mg  50 mg Oral QHS PRN Bobbitt, Shalon E, NP   50 mg at 08/28/24 2121    Lab Results:  No results found for this or any previous visit (from the past 48 hours).   Blood Alcohol level:  Lab Results  Component Value Date   [facility] <15 08/13/2024   ETH <15 01/14/2024    Metabolic Disorder Labs: Lab Results  Component Value Date   HGBA1C 5.1 08/21/2024   MPG 100 08/21/2024   MPG 96.8 01/18/2023   No results found for: PROLACTIN Lab Results   Component Value Date   CHOL 155 08/21/2024   TRIG 73 08/21/2024   HDL 46 08/21/2024   CHOLHDL 3.4 08/21/2024  VLDL 15 08/21/2024   LDLCALC 95 08/21/2024   LDLCALC 81 01/18/2023    Physical Findings: AIMS:  , ,  ,  ,    CIWA:    COWS:      Psychiatric Specialty Exam:  Presentation  General Appearance:  Appropriate for Environment  Eye Contact: Fleeting  Speech: Clear and Coherent  Speech Volume: Normal    Mood and Affect  Mood: Euthymic  Affect: Constricted   Thought Process  Thought Processes: Linear  Orientation:Partial  Thought Content:WDL  Hallucinations: Denies Ideas of Reference:None  Suicidal Thoughts: Denies Homicidal Thoughts: Denies  Sensorium  Memory: Immediate Fair  Judgment: Impaired  Insight: Lacking   Executive Functions  Concentration: Fair  Attention Span: Fair  Recall: Fair  Fund of Knowledge: Poor  Language: Poor   Psychomotor Activity  Psychomotor Activity: Normal Musculoskeletal: Strength & Muscle Tone: within normal limits Gait & Station: normal Assets  Assets: Social Support    Physical Exam: Physical Exam Pulmonary:     Effort: Pulmonary effort is normal.  Neurological:     Mental Status: He is alert.    Review of Systems  Respiratory:  Negative for shortness of breath.   Cardiovascular:  Negative for chest pain.  Gastrointestinal:  Negative for diarrhea, nausea and vomiting.  Psychiatric/Behavioral:  Negative for depression, hallucinations and suicidal ideas. The patient is not nervous/anxious.    Blood pressure 102/80, pulse 70, temperature 98.4 F (36.9 C), temperature source Oral, resp. rate 16, height 5' 10 (1.778 m), weight 75.8 kg, SpO2 99%. Body mass index is 23.96 kg/m.  Diagnosis: Principal Problem:   Undifferentiated schizophrenia (HCC)   PLAN: Safety and Monitoring:  -- Involuntary admission to inpatient psychiatric unit for safety, stabilization and  treatment  -- Daily contact with patient to assess and evaluate symptoms and progress in treatment  -- Patient's case to be discussed in multi-disciplinary team meeting  -- Observation Level : q15 minute checks  -- Vital signs:  q12 hours  -- Precautions: suicide, elopement, and assault -- Encouraged patient to participate in unit milieu and in scheduled group therapies  2. Psychiatric Treatment:  Scheduled Medications: - Continue Invega  9 mg once daily  - Haloperidol  decanoate LAI administered 09/01/2024    -- The risks/benefits/side-effects/alternatives to this medication were discussed in detail with the patient and time was given for questions. The patient consents to medication trial.  3. Medical Issues Being Addressed:  No acute concerns.  4. Discharge Planning:   -- Social work and case management to assist with discharge planning and identification of hospital follow-up needs prior to discharge  -- Estimated LOS: Unable to estimate at this time Discharge planned for Monday    Alondria Mousseau, NP

## 2024-09-04 NOTE — Progress Notes (Addendum)
°   09/04/24 0900  Psych Admission Type (Psych Patients Only)  Admission Status Involuntary  Psychosocial Assessment  Patient Complaints None  Eye Contact Fair  Facial Expression Flat  Affect Preoccupied  Speech Soft  Interaction Isolative  Motor Activity Pacing  Appearance/Hygiene In scrubs  Behavior Characteristics Cooperative  Mood Preoccupied  Thought Process  Coherency WDL  Content Preoccupation  Delusions None reported or observed  Perception WDL  Hallucination None reported or observed  Judgment Limited  Confusion None  Danger to Self  Current suicidal ideation? Denies  Danger to Others  Danger to Others None reported or observed  Danger to Others Abnormal  Harmful Behavior to others No threats or harm toward other people  Destructive Behavior No threats or harm toward property   Patient possible discharged home today.

## 2024-09-04 NOTE — Group Note (Signed)
 Recreation Therapy Group Note   Group Topic:Health and Wellness  Group Date: 09/04/2024 Start Time: 1530 End Time: 1610 Facilitators: Celestia Jeoffrey BRAVO, LRT, CTRS Location: Back Dayroom  Group Description: Light Exercise & Stretching. LRT discussed the mental and physical benefits of exercise. LRT and group discussed how physical activity can be used as a coping skill. LRT lead patients in a light exercise and stretching routine, with music being played in the background. Pt's encouraged to listen to their bodies and stop at any time if they experience feelings of discomfort or pain. Pts were encouraged to drink water and stay hydrated.   Goal Area(s) Addressed:  Patient will learn benefits of physical activity. Patient will identify exercise as a coping skill.  Patient will follow multistep directions. Patient will try a new leisure interest.   Affect/Mood: N/A   Participation Level: Did not attend    Clinical Observations/Individualized Feedback: Patient did not attend group.   Plan: Continue to engage patient in RT group sessions 2-3x/week.   Jeoffrey BRAVO Celestia, LRT, CTRS 09/04/2024 5:11 PM

## 2024-09-04 NOTE — [facility] Suicide Risk Assessment (Cosign Needed Addendum)
 [facility] Discharge Suicide Risk Assessment   Principal Problem: Undifferentiated schizophrenia (HCC) Discharge Diagnoses: Principal Problem:   Undifferentiated schizophrenia (HCC)   Total Time spent with patient: 30 minutes  Musculoskeletal: Strength & Muscle Tone: within normal limits Gait & Station: normal Patient leans: N/A  Psychiatric Specialty Exam  Presentation  General Appearance:  Appropriate for Environment  Eye Contact: Fair  Speech: Clear and Coherent  Speech Volume: Normal  Handedness: Right   Mood and Affect  Mood: Euthymic  Duration of Depression Symptoms: Greater than two weeks  Affect: Flat   Thought Process  Thought Processes: Coherent; Linear  Descriptions of Associations:Intact  Orientation:Full (Time, Place and Person)  Thought Content:WDL  History of Schizophrenia/Schizoaffective disorder:Yes  Duration of Psychotic Symptoms:No data recorded Hallucinations:Hallucinations: None  Ideas of Reference:None  Suicidal Thoughts:Suicidal Thoughts: No  Homicidal Thoughts:Homicidal Thoughts: No   Sensorium  Memory: Immediate Fair; Recent Fair; Remote Fair  Judgment: Impaired  Insight: Shallow   Executive Functions  Concentration: Fair  Attention Span: Fair  Recall: [employer] of Knowledge: Fair  Language: Fair   Psychomotor Activity  Psychomotor Activity: Psychomotor Activity: Normal   Assets  Assets: Manufacturing Systems Engineer; Housing; Physical Health; Social Support   Sleep  Sleep: Sleep: Good  Estimated Sleeping Duration (Last 24 Hours): 7.00-8.50 hours  Physical Exam: Physical Exam Pulmonary:     Effort: Pulmonary effort is normal.  Neurological:     Mental Status: He is alert and oriented to person, place, and time.    Review of Systems  Respiratory:  Negative for shortness of breath.   Cardiovascular:  Negative for chest pain.  Gastrointestinal:  Negative for abdominal pain.  Neurological:   Negative for headaches.  Psychiatric/Behavioral:  Negative for depression, hallucinations and suicidal ideas. The patient is not nervous/anxious.    Blood pressure 102/80, pulse 70, temperature 98.4 F (36.9 C), temperature source Oral, resp. rate 16, height 5' 10 (1.778 m), weight 75.8 kg, SpO2 99%. Body mass index is 23.96 kg/m.  Mental Status Per Nursing Assessment::   On Admission:  NA  Demographic Factors:  Male, Adolescent or young adult, and Unemployed  Loss Factors: NA  Historical Factors: NA  Risk Reduction Factors:   Living with another person, especially a relative and Positive social support  Continued Clinical Symptoms:  Schizophrenia:   Less than 31 years old Paranoid or undifferentiated type  Cognitive Features That Contribute To Risk:  Closed-mindedness    Suicide Risk:  Minimal: No identifiable suicidal ideation.  Patients presenting with no risk factors but with morbid ruminations; may be classified as minimal risk based on the severity of the depressive symptoms   Follow-up Information     [facility] Follow up.   Why: Crisis walk-in hours are Monday, Wednesday and Friday from 8 AM - 2 PM. Contact information: [address] [place] [address] [phone]                 Plan Of Care/Follow-up recommendations:  Activity:  Return to normal as tolerated and directed by medical team Diet:  Follow heart healthy diet   Zelda Sharps, NP

## 2024-09-04 NOTE — Discharge Summary (Signed)
 Physician Discharge Summary Note  Patient:  Eric Pineda is an 28 y.o., male MRN:  [id number] DOB:  05-24-1996 Patient phone:  [phone] (home)  Patient address:   [address] [place] [address],   Total time spent: 40 min Date of Admission:  08/14/2024 Date of Discharge: 09/04/2024  Reason for Admission:  Schizophrenia- Patient initially IVC'd by police for [place] the side of the highway for unknown amount of time and odd behaviors noted by family.  Principal Problem: Undifferentiated schizophrenia (HCC) Discharge Diagnoses: Principal Problem:   Undifferentiated schizophrenia (HCC)   Past Psychiatric History: see h&p  Family Psychiatric  History: see h&p Social History:  Social History   Substance and Sexual Activity  Alcohol Use Never     Social History   Substance and Sexual Activity  Drug Use Yes    Social History   Socioeconomic History   Marital status: Single    Spouse name: Not on file   Number of children: Not on file   Years of education: Not on file   Highest education level: Not on file  Occupational History   Not on file  Tobacco Use   Smoking status: Never   Smokeless tobacco: Never   Tobacco comments:    Patient denied current use of tobacco  Vaping Use   Vaping status: Never Used  Substance and Sexual Activity   Alcohol use: Never   Drug use: Yes   Sexual activity: Not on file  Other Topics Concern   Not on file  Social History Narrative   Not on file   Social Drivers of Health   Tobacco Use: Low Risk (08/14/2024)   Patient History    Smoking Tobacco Use: Never    Smokeless Tobacco Use: Never    Passive Exposure: Not on file  Financial Resource Strain: Not on file  Food Insecurity: Food Insecurity Present (08/14/2024)   Epic    Worried About Programme Researcher, Broadcasting/film/video in the Last Year: Sometimes true    [employer] of Food in the Last Year: Never true  Transportation Needs: No Transportation Needs (08/14/2024)   Epic     Lack of Transportation (Medical): No    Lack of Transportation (Non-Medical): No  Physical Activity: Not on file  Stress: Not on file  Social Connections: Unknown (01/17/2024)   Social Connection and Isolation Panel    Frequency of Communication with Friends and Family: Patient declined    Frequency of Social Gatherings with Friends and Family: Patient declined    Attends Religious Services: Patient declined    Active Member of Clubs or Organizations: Patient declined    Attends Banker Meetings: Patient declined    Marital Status: Never married  Depression (PHQ2-9): Not on file  Alcohol Screen: Low Risk (08/14/2024)   Alcohol Screen    Last Alcohol Screening Score (AUDIT): 0  Housing: Low Risk (08/14/2024)   Epic    Unable to Pay for Housing in the Last Year: No    Number of Times Moved in the Last Year: 0    Homeless in the Last Year: No  Utilities: Not At Risk (08/14/2024)   Epic    Threatened with loss of utilities: No  Health Literacy: Not on file   Past Medical History:  Past Medical History:  Diagnosis Date   Bizarre behavior 12/02/2019   History reviewed. No pertinent surgical history. Family History: History reviewed. No pertinent family history.  Hospital Course:    Patient was  brought to the inpatient psychiatric unit via IVC by local police after being found sitting [place] the highway for an extended period. Family reported the patient had been pacing at home and laughing inappropriately. Upon admission, patient exhibited minimal engagement with staff and peers and appeared internally preoccupied, responding to internal stimuli.  During hospitalization, Invega  was maximized to 9 mg daily, which patient tolerated well. Due to persistent psychotic symptoms, Haldol  was added to the medication regimen. Patient agreed to long-acting Haldol  injection and tolerated this without adverse effects. Patient consistently denied suicidal and homicidal ideations  throughout admission and denied auditory or visual hallucinations. No IM agitation medications were required during the hospitalization, and patient maintained safe behaviors throughout the admission.  By discharge, patient demonstrated improvement in engagement and communication. Patient initiated appropriate social interactions, such as asking nursing staff about their day. Internal preoccupation has improved and patient continues to deny hallucinations and suicidal or homicidal ideations. Patient is stable for discharge at this time.  Extensive safety planning was completed with patient's mother, documented in social work note. Mother voiced no safety concerns regarding patient's return home and agrees patient is stable for discharge. Outpatient psychiatric follow-up is strongly recommended given patient's history of multiple admissions and medication noncompliance post-discharge, however, patient declined outpatient services. Outpatient resources were provided to patient and family. All discharge medications were confirmed and called to patient's preferred pharmacy. Patient will continue Invega  9 mg daily as prescribed. Patient and mother verbalize understanding of all discharge instructions without questions or concerns.  Detailed risk assessment is complete based on clinical exam and individual risk factors and acute suicide risk is low and acute violence risk is low.    On the day of discharge, patient denies SI/HI/plan and denies hallucinations.  Patient remains future oriented.  Currently, all modifiable risk of harm to self/harm to others have been addressed and patient is no longer appropriate for the acute inpatient setting and is able to continue treatment for mental health needs in the community with the supports as indicated below.  Patient is educated and verbalized understanding of discharge plan of care including medications, follow-up appointments, mental health resources and further  crisis services in the community.  He is instructed to call 911 or present to the nearest emergency room should he experience any decompensation in mood, disturbance of bowel or return of suicidal/homicidal ideations.  Patient verbalizes understanding of this education and agrees to this plan of care  Physical Findings: AIMS:  , ,  ,  ,    CIWA:    COWS:      Psychiatric Specialty Exam:  Presentation  General Appearance:  Appropriate for Environment  Eye Contact: Fair  Speech: Clear and Coherent  Speech Volume: Normal    Mood and Affect  Mood: Euthymic  Affect: Flat   Thought Process  Thought Processes: Coherent; Linear  Descriptions of Associations:Intact  Orientation:Full (Time, Place and Person)  Thought Content:WDL  Hallucinations:Hallucinations: None  Ideas of Reference:None  Suicidal Thoughts:Suicidal Thoughts: No  Homicidal Thoughts:Homicidal Thoughts: No   Sensorium  Memory: Immediate Fair; Recent Fair; Remote Fair  Judgment: Impaired  Insight: Shallow   Executive Functions  Concentration: Fair  Attention Span: Fair  Recall: Fair  Fund of Knowledge: Fair  Language: Fair   Psychomotor Activity  Psychomotor Activity: Psychomotor Activity: Normal  Musculoskeletal: Strength & Muscle Tone: within normal limits Gait & Station: normal Assets  Assets: Manufacturing Systems Engineer; Housing; Physical Health; Social Support   Sleep  Sleep: Sleep: Good  Physical Exam: Physical Exam Pulmonary:     Effort: Pulmonary effort is normal.  Neurological:     Mental Status: He is alert and oriented to person, place, and time.    Review of Systems  Respiratory:  Negative for shortness of breath.   Cardiovascular:  Negative for chest pain.  Neurological:  Negative for headaches.  Psychiatric/Behavioral:  Negative for depression, hallucinations and suicidal ideas. The patient is not nervous/anxious.    Blood pressure 102/80,  pulse 70, temperature 98.4 F (36.9 C), temperature source Oral, resp. rate 16, height 5' 10 (1.778 m), weight 75.8 kg, SpO2 99%. Body mass index is 23.96 kg/m.   Tobacco Use History[1] Tobacco Cessation:  N/A, patient does not currently use tobacco products   Blood Alcohol level:  Lab Results  Component Value Date   [facility] <15 08/13/2024   ETH <15 01/14/2024    Metabolic Disorder Labs:  Lab Results  Component Value Date   HGBA1C 5.1 08/21/2024   MPG 100 08/21/2024   MPG 96.8 01/18/2023   No results found for: PROLACTIN Lab Results  Component Value Date   CHOL 155 08/21/2024   TRIG 73 08/21/2024   HDL 46 08/21/2024   CHOLHDL 3.4 08/21/2024   VLDL 15 08/21/2024   LDLCALC 95 08/21/2024   LDLCALC 81 01/18/2023    See Psychiatric Specialty Exam and Suicide Risk Assessment completed by Attending Physician prior to discharge.  Discharge destination:  Home  Is patient on multiple antipsychotic therapies at discharge:  Yes,   Do you recommend tapering to monotherapy for antipsychotics?  No   Has Patient had three or more failed trials of antipsychotic monotherapy by history:  Yes,   Antipsychotic medications that previously failed include:   1.  Zyprexa ., 2.  Invega ., and 3.  Haldol .  Recommended Plan for Multiple Antipsychotic Therapies: Additional reason(s) for multiple antispychotic treatment:  Despite maximizing Invega  to 9 mg daily, patient continued to exhibit persistent psychotic symptoms including internal preoccupation, responding to internal stimuli, and minimal engagement with others. Due to inadequate symptom control on Invega  monotherapy at therapeutic dosing, Haldol  was added for augmentation. Patient agreed to long-acting Haldol  injection to address the severity of ongoing psychotic symptoms and improve medication adherence given history of noncompliance. This dual antipsychotic approach was necessary to achieve clinical stability sufficient for safe discharge, as  evidenced by patient's subsequent improvement in engagement, improvement in internal preoccupation and ability to participate in appropriate social interactions.  Discharge Instructions     Increase activity slowly   Complete by: As directed       Allergies as of 09/04/2024   No Known Allergies      Medication List     STOP taking these medications    benztropine  0.5 MG tablet Commonly known as: COGENTIN    Invega  Sustenna 234 MG/1.5ML injection Generic drug: paliperidone        TAKE these medications      Indication  paliperidone  9 MG 24 hr tablet Commonly known as: INVEGA  Take 1 tablet (9 mg total) by mouth daily. Start taking on: September 05, 2024  Indication: Schizophrenia        Follow-up Information     [facility] Follow up.   Why: Crisis walk-in hours are Monday, Wednesday and Friday from 8 AM - 2 PM. Contact information: [address] [place] [address] [phone]                 Follow-up recommendations:  Activity:  Increase to normal as tolerated Diet:  Follow heart healthy diet    Signed: Zelda Sharps, NP This note was created using Dragon dictation software. Please excuse any inadvertent transcription errors. Case was discussed with supervising physician Dr. Ruther who is agreeable with current plan.            [1]  Social History Tobacco Use  Smoking Status Never  Smokeless Tobacco Never  Tobacco Comments   Patient denied current use of tobacco

## 2024-09-04 NOTE — Plan of Care (Signed)
   Problem: Education: Goal: Emotional status will improve Outcome: Progressing

## 2024-09-04 NOTE — Progress Notes (Signed)
 Discharge instructions given along with personal belongings. Discussed follow-up out patient care and prescriptions given.

## 2024-09-04 NOTE — Group Note (Signed)
 Recreation Therapy Group Note   Group Topic:Coping Skills  Group Date: 09/04/2024 Start Time: 1035 End Time: 1125 Facilitators: Celestia Jeoffrey BRAVO, LRT, CTRS Location: Craft Room  Group Description: Mind Map.  Patient was provided a blank template of a diagram with 32 blank boxes in a tiered system, branching from the center (similar to a bubble chart). LRT directed patients to label the middle of the diagram Coping Skills. LRT and patients then came up with 8 different coping skills as examples. Pt were directed to record their coping skills in the 2nd tier boxes closest to the center.  Patients would then share their coping skills with the group as LRT wrote them out. LRT gave a handout of 99 different coping skills at the end of group.   Goal Area(s) Addressed: Patients will be able to define coping skills. Patient will identify new coping skills.  Patient will increase communication.   Affect/Mood: N/A   Participation Level: Did not attend    Clinical Observations/Individualized Feedback: Patient did not attend group.   Plan: Continue to engage patient in RT group sessions 2-3x/week.   Jeoffrey BRAVO Celestia, LRT, CTRS 09/04/2024 11:55 AM

## 2024-09-04 NOTE — Progress Notes (Signed)
°  [facility] Adult Case Management Discharge Plan :  Will you be returning to the same living situation after discharge:  Yes,  Patient to return home.  At discharge, do you have transportation home?: Yes,  Patient's mother to provide transportation.  Do you have the ability to pay for your medications: Yes,  [employer]  Release of information consent forms completed and in the chart;  Patient's signature needed at discharge.  Patient to Follow up at:  Follow-up Information     [facility] Follow up.   Why: Crisis walk-in hours are Monday, Wednesday and Friday from 8 AM - 2 PM. Contact information: [address] [place] [address] [phone]                 Next level of care provider has access to [facility] Link:no  Safety Planning and Suicide Prevention discussed: Chaney Katos, mother, [phone]  Have you used any form of tobacco in the last 30 days? (Cigarettes, Smokeless Tobacco, Cigars, and/or Pipes): No  Has patient been referred to the Quitline?: Patient does not use tobacco/nicotine  products  Patient has been referred for addiction treatment: No known substance use disorder.  Alveta CHRISTELLA Kerns, LCSW 09/04/2024, 11:06 AM

## 2024-09-04 NOTE — Group Note (Signed)
 Date:  09/04/2024 Time:  10:34 AM  Group Topic/Focus:  Goals Group:   The focus of this group is to help patients establish daily goals to achieve during treatment and discuss how the patient can incorporate goal setting into their daily lives to aide in recovery.    Participation Level:  Active  Participation Quality:  Appropriate  Affect:  Appropriate  Cognitive:  Alert  Insight: Appropriate  Engagement in Group:  Engaged  Modes of Intervention:  Activity, Discussion, and Education  Additional Comments:    Eric Pineda 09/04/2024, 10:34 AM
